# Patient Record
Sex: Male | Born: 1955 | Race: Black or African American | Hispanic: No | Smoking: Never smoker
Health system: Southern US, Community
[De-identification: ages and names within clinical notes are randomized; demographics above are authoritative.]

## PROBLEM LIST (undated history)

## (undated) DIAGNOSIS — I10 Essential (primary) hypertension: Secondary | ICD-10-CM
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
## 2019-05-09 ENCOUNTER — Inpatient Hospital Stay (HOSPITAL_COMMUNITY)
Admission: EM | Admit: 2019-05-09 | Discharge: 2019-05-13 | DRG: 445 | Disposition: A | Discharge status: Home / Self Care | Payer: No Typology Code available for payment source | Source: Ambulatory Visit | Attending: Internal Medicine | Admitting: Internal Medicine

## 2019-05-09 ENCOUNTER — Emergency Department (HOSPITAL_COMMUNITY): Payer: No Typology Code available for payment source

## 2019-05-09 ENCOUNTER — Other Ambulatory Visit: Payer: Self-pay

## 2019-05-09 ENCOUNTER — Encounter (HOSPITAL_COMMUNITY): Payer: Self-pay | Admitting: Internal Medicine

## 2019-05-09 DIAGNOSIS — R9431 Abnormal electrocardiogram [ECG] [EKG]: Secondary | ICD-10-CM

## 2019-05-09 DIAGNOSIS — E871 Hypo-osmolality and hyponatremia: Secondary | ICD-10-CM | POA: Diagnosis present

## 2019-05-09 DIAGNOSIS — D649 Anemia, unspecified: Secondary | ICD-10-CM | POA: Diagnosis present

## 2019-05-09 DIAGNOSIS — T447X5A Adverse effect of beta-adrenoreceptor antagonists, initial encounter: Secondary | ICD-10-CM | POA: Diagnosis not present

## 2019-05-09 DIAGNOSIS — K760 Fatty (change of) liver, not elsewhere classified: Secondary | ICD-10-CM | POA: Diagnosis present

## 2019-05-09 DIAGNOSIS — Z807 Family history of other malignant neoplasms of lymphoid, hematopoietic and related tissues: Secondary | ICD-10-CM

## 2019-05-09 DIAGNOSIS — C801 Malignant (primary) neoplasm, unspecified: Secondary | ICD-10-CM | POA: Diagnosis not present

## 2019-05-09 DIAGNOSIS — R001 Bradycardia, unspecified: Secondary | ICD-10-CM | POA: Diagnosis not present

## 2019-05-09 DIAGNOSIS — D689 Coagulation defect, unspecified: Secondary | ICD-10-CM | POA: Diagnosis present

## 2019-05-09 DIAGNOSIS — R17 Unspecified jaundice: Secondary | ICD-10-CM | POA: Diagnosis not present

## 2019-05-09 DIAGNOSIS — N179 Acute kidney failure, unspecified: Secondary | ICD-10-CM | POA: Diagnosis present

## 2019-05-09 DIAGNOSIS — M1990 Unspecified osteoarthritis, unspecified site: Secondary | ICD-10-CM | POA: Diagnosis present

## 2019-05-09 DIAGNOSIS — Z20822 Contact with and (suspected) exposure to covid-19: Secondary | ICD-10-CM | POA: Diagnosis present

## 2019-05-09 DIAGNOSIS — K831 Obstruction of bile duct: Secondary | ICD-10-CM | POA: Diagnosis present

## 2019-05-09 DIAGNOSIS — E876 Hypokalemia: Secondary | ICD-10-CM | POA: Diagnosis present

## 2019-05-09 DIAGNOSIS — L299 Pruritus, unspecified: Secondary | ICD-10-CM | POA: Diagnosis present

## 2019-05-09 DIAGNOSIS — I10 Essential (primary) hypertension: Secondary | ICD-10-CM | POA: Diagnosis present

## 2019-05-09 DIAGNOSIS — G8929 Other chronic pain: Secondary | ICD-10-CM | POA: Diagnosis present

## 2019-05-09 DIAGNOSIS — C250 Malignant neoplasm of head of pancreas: Secondary | ICD-10-CM | POA: Diagnosis present

## 2019-05-09 HISTORY — DX: Essential (primary) hypertension: I10

## 2019-05-09 LAB — CBC
HCT: 33.4 % — ABNORMAL LOW (ref 39.0–52.0)
Hemoglobin: 11.5 g/dL — ABNORMAL LOW (ref 13.0–17.0)
MCH: 29.9 pg (ref 26.0–34.0)
MCHC: 34.4 g/dL (ref 30.0–36.0)
MCV: 87 fL (ref 80.0–100.0)
Platelets: 442 10*3/uL — ABNORMAL HIGH (ref 150–400)
RBC: 3.84 MIL/uL — ABNORMAL LOW (ref 4.22–5.81)
RDW: 17.5 % — ABNORMAL HIGH (ref 11.5–15.5)
WBC: 10.9 10*3/uL — ABNORMAL HIGH (ref 4.0–10.5)
nRBC: 0 % (ref 0.0–0.2)

## 2019-05-09 LAB — URINALYSIS, ROUTINE W REFLEX MICROSCOPIC
Glucose, UA: NEGATIVE mg/dL
Ketones, ur: NEGATIVE mg/dL
Leukocytes,Ua: NEGATIVE
Nitrite: NEGATIVE
Protein, ur: 100 mg/dL — AB
Specific Gravity, Urine: 1.015 (ref 1.005–1.030)
pH: 6 (ref 5.0–8.0)

## 2019-05-09 LAB — HEPATIC FUNCTION PANEL
ALT: 176 U/L — ABNORMAL HIGH (ref 0–44)
AST: 183 U/L — ABNORMAL HIGH (ref 15–41)
Albumin: 2.7 g/dL — ABNORMAL LOW (ref 3.5–5.0)
Alkaline Phosphatase: 668 U/L — ABNORMAL HIGH (ref 38–126)
Bilirubin, Direct: 31.1 mg/dL — ABNORMAL HIGH (ref 0.0–0.2)
Indirect Bilirubin: 17.3 mg/dL — ABNORMAL HIGH (ref 0.3–0.9)
Total Bilirubin: 48.4 mg/dL (ref 0.3–1.2)
Total Protein: 7.8 g/dL (ref 6.5–8.1)

## 2019-05-09 LAB — BASIC METABOLIC PANEL
Anion gap: 17 — ABNORMAL HIGH (ref 5–15)
BUN: 21 mg/dL (ref 8–23)
CO2: 22 mmol/L (ref 22–32)
Calcium: 10 mg/dL (ref 8.9–10.3)
Chloride: 88 mmol/L — ABNORMAL LOW (ref 98–111)
Creatinine, Ser: 1.41 mg/dL — ABNORMAL HIGH (ref 0.61–1.24)
GFR calc Af Amer: >60 mL/min (ref >60)
GFR calc non Af Amer: 52 mL/min — ABNORMAL LOW (ref >60)
Glucose, Bld: 117 mg/dL — ABNORMAL HIGH (ref 70–99)
Potassium: 2.7 mmol/L — CL (ref 3.5–5.1)
Sodium: 127 mmol/L — ABNORMAL LOW (ref 135–145)

## 2019-05-09 LAB — LIPASE, BLOOD: Lipase: 22 U/L (ref 11–51)

## 2019-05-09 MED ORDER — POTASSIUM CHLORIDE 10 MEQ/100ML IV SOLN
10.0000 meq | Freq: Once | INTRAVENOUS | Status: AC
  Administered 2019-05-09: 20:00:00 10 meq via INTRAVENOUS
  Filled 2019-05-09: qty 100

## 2019-05-09 MED ORDER — POTASSIUM CHLORIDE CRYS ER 20 MEQ PO TBCR
40.0000 meq | EXTENDED_RELEASE_TABLET | Freq: Once | ORAL | Status: AC
  Administered 2019-05-09: 20:00:00 40 meq via ORAL
  Filled 2019-05-09: qty 2

## 2019-05-09 MED ORDER — SODIUM CHLORIDE 0.9 % IV BOLUS
1000.0000 mL | Freq: Once | INTRAVENOUS | Status: AC
  Administered 2019-05-09: 20:00:00 1000 mL via INTRAVENOUS

## 2019-05-09 NOTE — ED Provider Notes (Signed)
[facility] EMERGENCY DEPARTMENT Provider Note   CSN: [id number] Arrival date & time: 05/09/19  1429     History Chief Complaint  Patient presents with  . Abnormal Lab    Isaac Doyle is a 64 y.o. male with past medical history significant for hypertension and osteoarthritis who presents to the ED after being informed by the [facility] that he had elevated liver enzymes.  Patient reports that a couple of weeks ago, he became pruritic and suspected seasonal allergies.  However, after it continued to persist and he developed dark urine, he called his family practitioner.  They obtained basic labs which evidently revealed transaminitis and elevated bilirubin, prompting them to send him to the ER for evaluation.  He endorses diminished appetite and intermittent nausea in addition to his generalized pruritus and dark urine, but denies any recent illness, fevers or chills, abdominal pain, vomiting or loose stools, dysuria, chest pain or shortness of breath, or other changes in bowel habits.  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  He will continue to have beverages intermittently, but not as frequently.  He denies any history of IVDA or other illicit drug use.  HPI     No past medical history on file.  There are no problems to display for this patient.    No family history on file.  Social History   Tobacco Use  . Smoking status: Not on file  Substance Use Topics  . Alcohol use: Not on file  . Drug use: Not on file    Home Medications Prior to Admission medications   Not on File    Allergies    Patient has no allergy information on record.  Review of Systems   Review of Systems  Constitutional: Positive for appetite change.  Respiratory: Negative for shortness of breath.   Cardiovascular: Negative for chest pain.  Gastrointestinal: Negative for abdominal pain, diarrhea and vomiting.  Skin: Negative for rash.     Physical  Exam Updated Vital Signs BP 127/60   Pulse (!) 52   Temp 98.5 F (36.9 C) (Oral)   Resp 14   Ht 5\' 11"  (1.803 m)   Wt 88.5 kg   SpO2 99%   BMI 27.20 kg/m   Physical Exam Vitals and nursing note reviewed. Exam conducted with a chaperone present.  Constitutional:      Appearance: Normal appearance.  HENT:     Head: Normocephalic and atraumatic.  Eyes:     General: Scleral icterus present.  Cardiovascular:     Rate and Rhythm: Normal rate and regular rhythm.     Pulses: Normal pulses.     Heart sounds: Normal heart sounds.  Pulmonary:     Effort: Pulmonary effort is normal. No respiratory distress.     Breath sounds: Normal breath sounds.  Abdominal:     Comments: Soft, nondistended.  No RUQ TTP or tenderness elsewhere.  No guarding.  NABS. Wearing a weight belt.   Musculoskeletal:     Cervical back: Normal range of motion. No rigidity.  Skin:    General: Skin is dry.     Capillary Refill: Capillary refill takes less than 2 seconds.     Coloration: Skin is jaundiced.  Neurological:     Mental Status: He is alert and oriented to person, place, and time.     GCS: GCS eye subscore is 4. GCS verbal subscore is 5. GCS motor subscore is 6.  Psychiatric:  Mood and Affect: Mood normal.        Behavior: Behavior normal.        Thought Content: Thought content normal.     ED Results / Procedures / Treatments   Labs (all labs ordered are listed, but only abnormal results are displayed) Labs Reviewed  CBC - Abnormal; Notable for the following components:      Result Value   WBC 10.9 (*)    RBC 3.84 (*)    Hemoglobin 11.5 (*)    HCT 33.4 (*)    RDW 17.5 (*)    Platelets 442 (*)    All other components within normal limits  BASIC METABOLIC PANEL - Abnormal; Notable for the following components:   Sodium 127 (*)    Potassium 2.7 (*)    Chloride 88 (*)    Glucose, Bld 117 (*)    Creatinine, Ser 1.41 (*)    GFR calc non Af Amer 52 (*)    Anion gap 17 (*)    All  other components within normal limits  URINALYSIS, ROUTINE W REFLEX MICROSCOPIC - Abnormal; Notable for the following components:   Color, Urine AMBER (*)    Hgb urine dipstick SMALL (*)    Bilirubin Urine MODERATE (*)    Protein, ur 100 (*)    Bacteria, UA RARE (*)    All other components within normal limits  HEPATIC FUNCTION PANEL - Abnormal; Notable for the following components:   Albumin 2.7 (*)    AST 183 (*)    ALT 176 (*)    Alkaline Phosphatase 668 (*)    Total Bilirubin 48.4 (*)    Bilirubin, Direct 31.1 (*)    Indirect Bilirubin 17.3 (*)    All other components within normal limits  SARS CORONAVIRUS 2 (TAT 6-24 HRS)  LIPASE, BLOOD    EKG None  Radiology US Abdomen Limited  Result Date: 05/09/2019 CLINICAL DATA:  64 year old male with jaundice and elevated LFTs. EXAM: ULTRASOUND ABDOMEN LIMITED RIGHT UPPER QUADRANT COMPARISON:  None. FINDINGS: Gallbladder: The gallbladder is distended. There is sludge and stones within the gallbladder. No gallbladder wall thickening or pericholecystic fluid. Common bile duct: Diameter: 15 mm. There is dilatation of the common bile duct as well as moderate intrahepatic biliary ductal dilatation. A centrally obstructing stone or mass is not excluded. Further initial evaluation with CT of the abdomen pelvis with IV contrast is recommended. MRCP may provide additional evaluation based on CT findings. Liver: There is mild diffuse increased liver echogenicity most commonly seen in the setting of fatty infiltration. Superimposed inflammation or fibrosis is not excluded. Clinical correlation is recommended. Portal vein is patent on color Doppler imaging with normal direction of blood flow towards the liver. Other: There is a 2.0 x 1.9 x 1.8 cm hypoechoic lesion in the region of the porta pedis and the head of the pancreas. IMPRESSION: Cholelithiasis with findings concerning for central biliary obstruction. Further evaluation with CT with IV contrast is  recommended. No definite sonographic findings of acute cholecystitis. Electronically Signed   By: Anner Crete M.D.   On: 05/09/2019 21:15    Procedures Procedures (including critical care time)  Medications Ordered in ED Medications  potassium chloride 10 mEq in 100 mL IVPB ( Intravenous Stopped 05/09/19 2128)  potassium chloride SA (KLOR-CON) CR tablet 40 mEq (40 mEq Oral Given 05/09/19 2017)  sodium chloride 0.9 % bolus 1,000 mL (1,000 mLs Intravenous New Bag/Given 05/09/19 2018)    ED Course  I have  reviewed the triage vital signs and the nursing notes.  Pertinent labs & imaging results that were available during my care of the patient were reviewed by me and considered in my medical decision making (see chart for details).  Clinical Course as of May 08 2316  Mon May 09, 2019  2143 Spoke with [facility] gastroenterology.  She will put in a consult note and we will ultimately have patient admitted for MRCP.  She prefers MRCP over CT with contrast.   [GG]  2311 Spoke with hospitalist who will admit patient.    [GG]    Clinical Course User Index [GG] Corena Herter, PA-C   MDM Rules/Calculators/A&P                      Hepatic function panel test demonstrates total bilirubin elevated at 48.4 in addition to transaminitis and elevated alkaline phosphatase of 668.  Direct bilirubin is 31.1 and indirect is 17.3.  Patient was also found to be hypokalemic to 2.7 and hyponatremic to 127.  Replenished with 1 L NS, 40 mEq potassium p.o., and 10 mEq IV.    Limited ultrasound obtained of RUQ demonstrates dilation of the CBD and cholelithiasis with findings concerning for central biliary obstruction.  The gallbladder is distended, but there is no gallbladder wall thickening and negative sonographic Murphy's sign.  Before obtaining CT with contrast, we will first consult with gastroenterology to determine whether or not they would prefer MRCP or ERCP tomorrow.  Will keep NPO after midnight.   Spoke with [facility] gastroenterology.  She will put in a consult note and we will ultimately have patient admitted for MRCP.  She prefers MRCP over CT with contrast.  Spoke with hospitalist who will admit patient.   Final Clinical Impression(s) / ED Diagnoses Final diagnoses:  Hypokalemia  Prolonged Q-T interval on ECG  Jaundice    Rx / DC Orders ED Discharge Orders    None       Corena Herter, PA-C 05/10/19 0008    Margette Fast, MD 05/10/19 1246

## 2019-05-09 NOTE — ED Triage Notes (Signed)
Pt reports dark urine and loss of appetite for 2 weeks. Had labs done at [facility] and sent here due to elevated liver enzymes. Pt jaundice.

## 2019-05-10 ENCOUNTER — Inpatient Hospital Stay (HOSPITAL_COMMUNITY): Payer: No Typology Code available for payment source

## 2019-05-10 ENCOUNTER — Encounter (HOSPITAL_COMMUNITY): Payer: Self-pay | Admitting: Internal Medicine

## 2019-05-10 DIAGNOSIS — I10 Essential (primary) hypertension: Secondary | ICD-10-CM | POA: Diagnosis present

## 2019-05-10 DIAGNOSIS — E876 Hypokalemia: Secondary | ICD-10-CM | POA: Diagnosis present

## 2019-05-10 DIAGNOSIS — D649 Anemia, unspecified: Secondary | ICD-10-CM | POA: Diagnosis present

## 2019-05-10 DIAGNOSIS — N179 Acute kidney failure, unspecified: Secondary | ICD-10-CM | POA: Diagnosis present

## 2019-05-10 DIAGNOSIS — R17 Unspecified jaundice: Secondary | ICD-10-CM

## 2019-05-10 DIAGNOSIS — C801 Malignant (primary) neoplasm, unspecified: Secondary | ICD-10-CM

## 2019-05-10 DIAGNOSIS — K831 Obstruction of bile duct: Secondary | ICD-10-CM

## 2019-05-10 LAB — BASIC METABOLIC PANEL
Anion gap: 14 (ref 5–15)
Anion gap: 11 (ref 5–15)
BUN: 15 mg/dL (ref 8–23)
BUN: 11 mg/dL (ref 8–23)
CO2: 24 mmol/L (ref 22–32)
CO2: 20 mmol/L — ABNORMAL LOW (ref 22–32)
Calcium: 9.5 mg/dL (ref 8.9–10.3)
Calcium: 8.5 mg/dL — ABNORMAL LOW (ref 8.9–10.3)
Chloride: 92 mmol/L — ABNORMAL LOW (ref 98–111)
Chloride: 93 mmol/L — ABNORMAL LOW (ref 98–111)
Creatinine, Ser: 1.27 mg/dL — ABNORMAL HIGH (ref 0.61–1.24)
Creatinine, Ser: 1.04 mg/dL (ref 0.61–1.24)
GFR calc Af Amer: >60 mL/min (ref >60)
GFR calc Af Amer: >60 mL/min (ref >60)
GFR calc non Af Amer: 59 mL/min — ABNORMAL LOW (ref >60)
GFR calc non Af Amer: >60 mL/min (ref >60)
Glucose, Bld: 119 mg/dL — ABNORMAL HIGH (ref 70–99)
Glucose, Bld: 248 mg/dL — ABNORMAL HIGH (ref 70–99)
Potassium: 2.2 mmol/L — CL (ref 3.5–5.1)
Potassium: 3.8 mmol/L (ref 3.5–5.1)
Sodium: 130 mmol/L — ABNORMAL LOW (ref 135–145)
Sodium: 124 mmol/L — ABNORMAL LOW (ref 135–145)

## 2019-05-10 LAB — HEPATITIS PANEL, ACUTE
HCV Ab: NONREACTIVE
Hep A IgM: NONREACTIVE
Hep B C IgM: NONREACTIVE
Hepatitis B Surface Ag: NONREACTIVE

## 2019-05-10 LAB — CBC
HCT: 30.7 % — ABNORMAL LOW (ref 39.0–52.0)
Hemoglobin: 10.6 g/dL — ABNORMAL LOW (ref 13.0–17.0)
MCH: 30.1 pg (ref 26.0–34.0)
MCHC: 34.5 g/dL (ref 30.0–36.0)
MCV: 87.2 fL (ref 80.0–100.0)
Platelets: 392 10*3/uL (ref 150–400)
RBC: 3.52 MIL/uL — ABNORMAL LOW (ref 4.22–5.81)
RDW: 17.6 % — ABNORMAL HIGH (ref 11.5–15.5)
WBC: 8.6 10*3/uL (ref 4.0–10.5)
nRBC: 0 % (ref 0.0–0.2)

## 2019-05-10 LAB — PROTIME-INR
INR: 2.4 — ABNORMAL HIGH (ref 0.8–1.2)
Prothrombin Time: 25.9 seconds — ABNORMAL HIGH (ref 11.4–15.2)

## 2019-05-10 LAB — ACETAMINOPHEN LEVEL: Acetaminophen (Tylenol), Serum: <10 ug/mL — ABNORMAL LOW (ref 10–30)

## 2019-05-10 LAB — MAGNESIUM: Magnesium: 1.8 mg/dL (ref 1.7–2.4)

## 2019-05-10 LAB — SARS CORONAVIRUS 2 (TAT 6-24 HRS): SARS Coronavirus 2: NEGATIVE

## 2019-05-10 LAB — HIV ANTIBODY (ROUTINE TESTING W REFLEX): HIV Screen 4th Generation wRfx: NONREACTIVE

## 2019-05-10 LAB — ETHANOL: Alcohol, Ethyl (B): <10 mg/dL (ref <10)

## 2019-05-10 MED ORDER — SODIUM CHLORIDE 0.9 % IV SOLN: INTRAVENOUS | Status: DC

## 2019-05-10 MED ORDER — VITAMIN K1 10 MG/ML IJ SOLN
10.0000 mg | Freq: Once | INTRAVENOUS | Status: AC
  Administered 2019-05-10: 10 mg via INTRAVENOUS
  Filled 2019-05-10: qty 1

## 2019-05-10 MED ORDER — POTASSIUM CHLORIDE CRYS ER 20 MEQ PO TBCR
40.0000 meq | EXTENDED_RELEASE_TABLET | Freq: Two times a day (BID) | ORAL | Status: AC
  Administered 2019-05-10: 09:00:00 40 meq via ORAL
  Filled 2019-05-10: qty 2

## 2019-05-10 MED ORDER — AMLODIPINE BESYLATE 10 MG PO TABS
10.0000 mg | ORAL_TABLET | Freq: Every day | ORAL | Status: DC
  Administered 2019-05-10 – 2019-05-12 (×2): 10 mg via ORAL
  Filled 2019-05-10 (×2): qty 1
  Filled 2019-05-10: qty 2

## 2019-05-10 MED ORDER — THIAMINE HCL 100 MG/ML IJ SOLN
100.0000 mg | Freq: Every day | INTRAMUSCULAR | Status: DC
  Administered 2019-05-10 – 2019-05-11 (×2): 100 mg via INTRAVENOUS
  Filled 2019-05-10 (×2): qty 2

## 2019-05-10 MED ORDER — ATENOLOL 50 MG PO TABS
200.0000 mg | ORAL_TABLET | Freq: Every day | ORAL | Status: DC
  Administered 2019-05-10: 09:00:00 200 mg via ORAL
  Filled 2019-05-10: qty 4

## 2019-05-10 MED ORDER — WHITE PETROLATUM EX OINT
TOPICAL_OINTMENT | CUTANEOUS | Status: AC
  Administered 2019-05-10: 0.2
  Filled 2019-05-10: qty 28.35

## 2019-05-10 MED ORDER — POTASSIUM CHLORIDE 10 MEQ/100ML IV SOLN
10.0000 meq | INTRAVENOUS | Status: AC
  Administered 2019-05-10 (×2): 10 meq via INTRAVENOUS
  Filled 2019-05-10 (×2): qty 100

## 2019-05-10 MED ORDER — POTASSIUM CHLORIDE CRYS ER 20 MEQ PO TBCR: 40.0000 meq | EXTENDED_RELEASE_TABLET | Freq: Two times a day (BID) | ORAL | Status: DC

## 2019-05-10 MED ORDER — POTASSIUM CHLORIDE 10 MEQ/100ML IV SOLN
10.0000 meq | INTRAVENOUS | Status: AC
  Administered 2019-05-10 (×4): 10 meq via INTRAVENOUS
  Filled 2019-05-10 (×4): qty 100

## 2019-05-10 MED ORDER — DEXTROSE-NACL 5-0.45 % IV SOLN: INTRAVENOUS | Status: DC

## 2019-05-10 NOTE — Consult Note (Addendum)
Consultation  Referring Provider: [facility]/ Lara Mulch  Primary Care Physician:  System, Pcp Not In Primary Gastroenterologist:  unassigned.  Reason for Consultation:  Jaundice  HPI: Isaac Doyle is a 64 y.o. male, who was admitted through the emergency room last evening, after he had gone to the [place] with concerns for jaundice.  He had labs done and was told to go to an emergency room. He relates 2- 3-week history of dark urine, onset of jaundice, and poor appetite.Marland Kitchen  He says he became concerned as his urine became progressively more dark.  At onset of symptoms he also noted pruritus which has persisted.  He denies any abdominal pain, no nausea or vomiting, and no fever or chills.  He thinks he has lost about 15 pounds since onset of symptoms.  Labs in the ER here with T bili of 48.4/AST 183/ALT 176/alk phos 668, albumin 2.7 WBC 10.9/hemoglobin 11.5 Potassium 2.7 and corrected. COVID-19 negative. Upper abdominal ultrasound showed a distended gallbladder with multiple stones and sludge, no gallbladder wall thickening, CBD of 15 mm with associated intrahepatic ductal dilation.  Could not rule out centrally obstructing stone versus mass.  Also noted to have a fatty liver and a hypoechoic region at the porta hepatis measuring 2.0 x 1.9 x 1.8 cm.  Patient had MRI/MRCP this morning without contrast which shows marked intrahepatic ductal dilation and CBD dilation to 16 mm.  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  No obvious adenopathy or metastatic disease.  He receives his general medical care from the [facility], says he has hypertension and chronic back pain.  He has had prior colonoscopies and thinks he is due for a follow-up.   Past Medical History:  Diagnosis Date  . Hypertension     History reviewed. No pertinent surgical history.  Prior to Admission medications   Medication  Sig Start Date End Date Taking? Authorizing Provider  amLODipine (NORVASC) 10 MG tablet Take 10 mg by mouth daily.   Yes [provider]  atenolol-chlorthalidone (TENORETIC) 100-25 MG tablet Take 2 tablets by mouth daily.   Yes [provider]  cetirizine (ZYRTEC) 10 MG tablet Take 10 mg by mouth daily.   Yes [provider]  guaifenesin (HUMIBID E) 400 MG TABS tablet Take 400 mg by mouth 2 (two) times daily.   Yes [provider]  sulindac (CLINORIL) 200 MG tablet Take 200 mg by mouth 2 (two) times daily.   Yes [provider]    Current Facility-Administered Medications  Medication Dose Route Frequency Provider Last Rate Last Admin  . amLODipine (NORVASC) tablet 10 mg  10 mg Oral Daily Rise Patience, MD   10 mg at 05/10/19 9371  . atenolol (TENORMIN) tablet 200 mg  200 mg Oral Daily Rise Patience, MD   200 mg at 05/10/19 0851  . potassium chloride 10 mEq in 100 mL IVPB  10 mEq Intravenous Q1 Hr x 6 Alma Friendly, MD 100 mL/hr at 05/10/19 1025 10 mEq at 05/10/19 1025  . potassium chloride SA (KLOR-CON) CR tablet 40 mEq  40 mEq Oral BID Alma Friendly, MD      . thiamine (B-1) injection 100 mg  100 mg Intravenous Daily Rise Patience, MD   100 mg at 05/10/19 6967   Current Outpatient Medications  Medication Sig Dispense Refill  . amLODipine (NORVASC) 10  MG tablet Take 10 mg by mouth daily.    Marland Kitchen atenolol-chlorthalidone (TENORETIC) 100-25 MG tablet Take 2 tablets by mouth daily.    . cetirizine (ZYRTEC) 10 MG tablet Take 10 mg by mouth daily.    Marland Kitchen guaifenesin (HUMIBID E) 400 MG TABS tablet Take 400 mg by mouth 2 (two) times daily.    . sulindac (CLINORIL) 200 MG tablet Take 200 mg by mouth 2 (two) times daily.      Allergies as of 05/09/2019  . (Not on File)    Family History  Problem Relation Age of Onset  . Lymphoma Mother     Social History   Socioeconomic History  . Marital status: Divorced    Spouse  name: Not on file  . Number of children: Not on file  . Years of education: Not on file  . Highest education level: Not on file  Occupational History  . Not on file  Tobacco Use  . Smoking status: Never Smoker  . Smokeless tobacco: Never Used  Substance and Sexual Activity  . Alcohol use: Yes  . Drug use: Not Currently  . Sexual activity: Not on file  Other Topics Concern  . Not on file  Social History Narrative  . Not on file   Social Determinants of Health   Financial Resource Strain:   . Difficulty of Paying Living Expenses:   Food Insecurity:   . Worried About Charity fundraiser in the Last Year:   . Arboriculturist in the Last Year:   Transportation Needs:   . Film/video editor (Medical):   Marland Kitchen Lack of Transportation (Non-Medical):   Physical Activity:   . Days of Exercise per Week:   . Minutes of Exercise per Session:   Stress:   . Feeling of Stress :   Social Connections:   . Frequency of Communication with Friends and Family:   . Frequency of Social Gatherings with Friends and Family:   . Attends Religious Services:   . Active Member of Clubs or Organizations:   . Attends Archivist Meetings:   Marland Kitchen Marital Status:   Intimate Partner Violence:   . Fear of Current or Ex-Partner:   . Emotionally Abused:   Marland Kitchen Physically Abused:   . Sexually Abused:     Review of Systems: Pertinent positive and negative review of systems were noted in the above HPI section.  All other review of systems was otherwise negative.Marland Kitchen  Physical Exam: Vital signs in last 24 hours: Temp:  [98.3 F (36.8 C)-98.5 F (36.9 C)] 98.3 F (36.8 C) (04/06 0400) Pulse Rate:  [50-76] 60 (04/06 0930) Resp:  [14-20] 18 (04/06 0930) BP: (105-140)/(60-96) 124/76 (04/06 0930) SpO2:  [97 %-100 %] 99 % (04/06 0930) Weight:  [88.5 kg] 88.5 kg (04/05 1442)   General:   Alert,  Well-developed, well-nourished, older African-American male pleasant and cooperative in NAD.  Daughter at  bedside Head:  Normocephalic and atraumatic. Eyes:  Sclera deeply icteric.   Conjunctiva pink. Ears:  Normal auditory acuity. Nose:  No deformity, discharge,  or lesions. Mouth:  No deformity or lesions.   Neck:  Supple; no masses or thyromegaly. Lungs:  Clear throughout to auscultation.   No wheezes, crackles, or rhonchi. Heart:  Regular rate and rhythm; no murmurs, clicks, rubs,  or gallops. Abdomen:  Soft,nontender, BS active,nonpalp mass or hsm.   Rectal:  Deferred  Msk:  Symmetrical without gross deformities. . Pulses:  Normal pulses noted. Extremities:  Without clubbing or edema. Neurologic:  Alert and  oriented x4;  grossly normal neurologically. Skin: Jaundiced Psych:  Alert and cooperative. Normal mood and affect.  Intake/Output from previous day: 04/05 0701 - 04/06 0700 In: 631.7 [IV Piggyback:631.7] Out: 1000 [Urine:1000] Intake/Output this shift: Total I/O In: 100 [IV Piggyback:100] Out: -   Lab Results: Recent Labs    05/09/19 1451 05/10/19 0621  WBC 10.9* 8.6  HGB 11.5* 10.6*  HCT 33.4* 30.7*  PLT 442* 392   BMET Recent Labs    05/09/19 1451 05/10/19 0621  NA 127* 130*  K 2.7* 2.2*  CL 88* 92*  CO2 22 24  GLUCOSE 117* 119*  BUN 21 15  CREATININE 1.41* 1.27*  CALCIUM 10.0 9.5   LFT Recent Labs    05/09/19 1924  PROT 7.8  ALBUMIN 2.7*  AST 183*  ALT 176*  ALKPHOS 668*  BILITOT 48.4*  BILIDIR 31.1*  IBILI 17.3*   PT/INR No results for input(s): LABPROT, INR in the last 72 hours. Hepatitis Panel No results for input(s): HEPBSAG, HCVAB, HEPAIGM, HEPBIGM in the last 72 hours.      IMPRESSION:  #74 64 year old African-American male with 2 to 3-week history, onset of painless jaundice associated with pruritus, lack of appetite and weight loss. Noted to have marked elevation of T bili at 48.4. 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  He also has multiple gallstones, no gallbladder  wall thickening and no evidence of choledocholithiasis on MRCP.  #2 hypertension #3 history of chronic back pain #4 remote history of colon polyps #5 Daily EtOH use-generally 6 beers per day  #6 hypokalemia- last k+ down to 2.2 - I ordered 3 runs- may need additional   Plan; Patient will need ERCP, with probable stent placement and brushings of the bile duct.  Procedure was discussed in detail with the patient and his daughter including indications risks and benefits to include post procedure pancreatitis, bleeding, infection, perforation and procedure failure and he has agreed to proceed. Patient will be scheduled with Dr. Scarlette Shorts for tomorrow afternoon.  Preop antibiotics ordered Full liquid diet today, n.p.o. past midnight tonight Potassium will need to be corrected to at least 3.0 prior to anesthesia  Have ordered CA 19-9 and CEA. Thank you will follow with you.    Author Hatlestad EsterwoodPA-C  05/10/2019, 11:15 AM

## 2019-05-10 NOTE — ED Notes (Signed)
Lunch Tray Ordered @ 1052.  

## 2019-05-10 NOTE — H&P (Signed)
History and Physical    Nikoloz Kurowski [id number] DOB: [birth year] DOA: 05/09/2019  PCP: System, Pcp Not In  Patient coming from: Home.  Chief Complaint: Jaundice.  HPI: Daveed Zabaleta is a 64 y.o. male with history of hypertension presents to the ER the patient was noticed to have elevated liver enzymes.  Patient states over the last 2 weeks patient has been getting more jaundiced with dark urine and poor appetite.  Had gone to [place] had labs done and was found to have abnormal LFTs and was referred to the ER.  Denies any abdominal pain nausea vomiting diarrhea fever or chills.  Denies chest pain or shortness of breath.  ED Course: In the ER on exam patient is icteric abdomen is benign on exam.  Labs show total bilirubin of 48.4 with AST of 183 ALT of 176 alkaline phosphatase 668 albumin 2.7 hemoglobin 11.5 platelets 442 WBC 10.9 creatinine 1.4 Covid test negative.  Potassium was 2.7 for which patient was given potassium replacement.  Some of the abdomen shows gallstones with possible CBD obstruction for which liver GI was consulted who requested MRCP.  Admitted for further management.  Review of Systems: As per HPI, rest all negative.   Past Medical History:  Diagnosis Date  . Hypertension     History reviewed. No pertinent surgical history.   reports that he has never smoked. He has never used smokeless tobacco. He reports current alcohol use. He reports previous drug use.  Not on File  Family History  Problem Relation Age of Onset  . Lymphoma Mother     Prior to Admission medications   Not on File    Physical Exam: Constitutional: Moderately built and nourished. Vitals:   05/09/19 2130 05/09/19 2300 05/09/19 2330 05/10/19 0100  BP: 127/60 (!) 124/96 131/65 130/60  Pulse: (!) 52 (!) 50 (!) 57   Resp: 14 19 15    Temp:      TempSrc:      SpO2: 99% 100% 100% 100%  Weight:      Height:       Eyes: Icterus present no pallor. ENMT: No discharge from the ears eyes  nose or mouth. Neck: No mass felt.  No neck rigidity. Respiratory: No rhonchi or crepitations. Cardiovascular: S1-S2 heard. Abdomen: Soft nontender bowel sound present. Musculoskeletal: No edema. Skin: No rash. Neurologic: Alert awake oriented to time place and person.  Moves all extremities. Psychiatric: Appears normal per normal affect.   Labs on Admission: I have personally reviewed following labs and imaging studies  CBC: Recent Labs  Lab 05/09/19 1451  WBC 10.9*  HGB 11.5*  HCT 33.4*  MCV 87.0  PLT 99991111*   Basic Metabolic Panel: Recent Labs  Lab 05/09/19 1451  NA 127*  K 2.7*  CL 88*  CO2 22  GLUCOSE 117*  BUN 21  CREATININE 1.41*  CALCIUM 10.0   GFR: Estimated Creatinine Clearance: 55.6 mL/min (A) (by C-G formula based on SCr of 1.41 mg/dL (H)). Liver Function Tests: Recent Labs  Lab 05/09/19 1924  AST 183*  ALT 176*  ALKPHOS 668*  BILITOT 48.4*  PROT 7.8  ALBUMIN 2.7*   Recent Labs  Lab 05/09/19 1943  LIPASE 22   No results for input(s): AMMONIA in the last 168 hours. Coagulation Profile: No results for input(s): INR, PROTIME in the last 168 hours. Cardiac Enzymes: No results for input(s): CKTOTAL, CKMB, CKMBINDEX, TROPONINI in the last 168 hours. BNP (last 3 results) No results for input(s): PROBNP  in the last 8760 hours. HbA1C: No results for input(s): HGBA1C in the last 72 hours. CBG: No results for input(s): GLUCAP in the last 168 hours. Lipid Profile: No results for input(s): CHOL, HDL, LDLCALC, TRIG, CHOLHDL, LDLDIRECT in the last 72 hours. Thyroid Function Tests: No results for input(s): TSH, T4TOTAL, FREET4, T3FREE, THYROIDAB in the last 72 hours. Anemia Panel: No results for input(s): VITAMINB12, FOLATE, FERRITIN, TIBC, IRON, RETICCTPCT in the last 72 hours. Urine analysis:    Component Value Date/Time   COLORURINE AMBER (A) 05/09/2019 1830   APPEARANCEUR CLEAR 05/09/2019 1830   LABSPEC 1.015 05/09/2019 1830   PHURINE 6.0  05/09/2019 1830   GLUCOSEU NEGATIVE 05/09/2019 1830   HGBUR SMALL (A) 05/09/2019 1830   BILIRUBINUR MODERATE (A) 05/09/2019 1830   [place] 05/09/2019 1830   PROTEINUR 100 (A) 05/09/2019 1830   NITRITE NEGATIVE 05/09/2019 1830   LEUKOCYTESUR NEGATIVE 05/09/2019 1830   Sepsis Labs: @LABRCNTIP (procalcitonin:4,lacticidven:4) )No results found for this or any previous visit (from the past 240 hour(s)).   Radiological Exams on Admission: US Abdomen Limited  Result Date: 05/09/2019 CLINICAL DATA:  64 year old male with jaundice and elevated LFTs. EXAM: ULTRASOUND ABDOMEN LIMITED RIGHT UPPER QUADRANT COMPARISON:  None. FINDINGS: Gallbladder: The gallbladder is distended. There is sludge and stones within the gallbladder. No gallbladder wall thickening or pericholecystic fluid. Common bile duct: Diameter: 15 mm. There is dilatation of the common bile duct as well as moderate intrahepatic biliary ductal dilatation. A centrally obstructing stone or mass is not excluded. Further initial evaluation with CT of the abdomen pelvis with IV contrast is recommended. MRCP may provide additional evaluation based on CT findings. Liver: There is mild diffuse increased liver echogenicity most commonly seen in the setting of fatty infiltration. Superimposed inflammation or fibrosis is not excluded. Clinical correlation is recommended. Portal vein is patent on color Doppler imaging with normal direction of blood flow towards the liver. Other: There is a 2.0 x 1.9 x 1.8 cm hypoechoic lesion in the region of the porta pedis and the head of the pancreas. IMPRESSION: Cholelithiasis with findings concerning for central biliary obstruction. Further evaluation with CT with IV contrast is recommended. No definite sonographic findings of acute cholecystitis. Electronically Signed   By: Anner Crete M.D.   On: 05/09/2019 21:15    EKG: Independently reviewed.  Normal sinus rhythm.  Assessment/Plan Principal  Problem:   Jaundice Active Problems:   ARF (acute renal failure) (HCC)   Essential hypertension   Hypokalemia   Normochromic normocytic anemia    1. Obstructive jaundice -liver GI has been consulted.  We will keep patient n.p.o. except medications and check MRCP.  Follow LFTs. 2. Elevated LFTs likely from obstruction.  Check acute hepatitis panel and also patient states he does not drink alcohol.  Denies taking any Tylenol.  Will await MRCP. 3. Hypertension on amlodipine and atenolol. 4. History of chronic pain usually takes sulindac. 5. Renal failure could be acute.  No old labs to compare.  6. Hypokalemia likely from poor oral intake replace recheck check magnesium level with next blood draw. 7. Normocytic normochromic anemia no old labs to compare follow CBC. 8. Patient states he does drink alcohol sometimes has sixpack over a week.  Will closely monitor for any withdrawal thiamine for now.  Given the markedly elevated LFTs with possible obstruction patient will need close monitoring and further work-up and will need inpatient status.   DVT prophylaxis: SCDs.  Will avoid anticoagulation in anticipation of procedure. Code Status:  Full code. Family Communication: Discussed with patient. Disposition Plan: Home. Consults called: [facility]. Admission status: Observation.   Rise Patience MD [facility] Pager [phone].  If 7PM-7AM, please contact night-coverage [email or web address] Password [email or web address]  05/10/2019, 1:51 AM

## 2019-05-10 NOTE — ED Notes (Signed)
Pt transported to MRI 

## 2019-05-10 NOTE — ED Notes (Signed)
ED Provider at bedside. 

## 2019-05-10 NOTE — ED Notes (Signed)
Dr. Horris Latino notified of latest potassium value

## 2019-05-10 NOTE — ED Notes (Signed)
Breakfast Ordered 

## 2019-05-10 NOTE — Plan of Care (Signed)
  Problem: Education: Goal: Knowledge of General Education information will improve Description: Including pain rating scale, medication(s)/side effects and non-pharmacologic comfort measures Outcome: Progressing   Problem: Clinical Measurements: Goal: Diagnostic test results will improve Outcome: Progressing   Problem: Coping: Goal: Level of anxiety will decrease Outcome: Progressing   

## 2019-05-10 NOTE — Progress Notes (Signed)
   Follow Up Note Pt admitted earlier this morning, please see H&P for details Briefly 64 year old male with past medical history of hypertension, possible alcohol abuse, presents to the ER complaining of jaundice, dark urine and poor appetite for the past 2 weeks.  [facility] and was found to have abnormal LFTs and referred to the ER.  Labs notable for elevated liver enzymes, with total bili of about 48, and hypokalemia.  Abdominal ultrasound showed cholelithiasis but no findings of acute cholecystitis.  MRCP showed possible mass in the pancreatic head.  GI consulted, plan for ERCP on 05/11/2019.     Exam: CV: S1, S2 present Lungs: CTA B Abd: Soft, nontender, nondistended, bowel sounds present Ext: No pedal edema bilaterally  Present on Admission: . Jaundice . ARF (acute renal failure) ([place]) . Essential hypertension . Hypokalemia . 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

## 2019-05-11 ENCOUNTER — Encounter (HOSPITAL_COMMUNITY)
Admission: EM | Disposition: A | Discharge status: Home / Self Care | Payer: Self-pay | Source: Ambulatory Visit | Attending: Internal Medicine

## 2019-05-11 ENCOUNTER — Encounter (HOSPITAL_COMMUNITY): Payer: Self-pay | Admitting: Internal Medicine

## 2019-05-11 ENCOUNTER — Inpatient Hospital Stay (HOSPITAL_COMMUNITY): Payer: No Typology Code available for payment source

## 2019-05-11 ENCOUNTER — Inpatient Hospital Stay (HOSPITAL_COMMUNITY): Payer: No Typology Code available for payment source | Admitting: Certified Registered"

## 2019-05-11 DIAGNOSIS — K831 Obstruction of bile duct: Secondary | ICD-10-CM

## 2019-05-11 DIAGNOSIS — R17 Unspecified jaundice: Secondary | ICD-10-CM

## 2019-05-11 HISTORY — PX: SPHINCTEROTOMY: SHX5544

## 2019-05-11 HISTORY — PX: ERCP: SHX5425

## 2019-05-11 HISTORY — PX: BILIARY STENT PLACEMENT: SHX5538

## 2019-05-11 LAB — CBC WITH DIFFERENTIAL/PLATELET
Abs Immature Granulocytes: 0.04 10*3/uL (ref 0.00–0.07)
Basophils Absolute: 0.1 10*3/uL (ref 0.0–0.1)
Basophils Relative: 1 %
Eosinophils Absolute: 0.3 10*3/uL (ref 0.0–0.5)
Eosinophils Relative: 3 %
HCT: 31.9 % — ABNORMAL LOW (ref 39.0–52.0)
Hemoglobin: 10.9 g/dL — ABNORMAL LOW (ref 13.0–17.0)
Immature Granulocytes: 1 %
Lymphocytes Relative: 17 %
Lymphs Abs: 1.4 10*3/uL (ref 0.7–4.0)
MCH: 30.3 pg (ref 26.0–34.0)
MCHC: 34.2 g/dL (ref 30.0–36.0)
MCV: 88.6 fL (ref 80.0–100.0)
Monocytes Absolute: 0.6 10*3/uL (ref 0.1–1.0)
Monocytes Relative: 7 %
Neutro Abs: 5.9 10*3/uL (ref 1.7–7.7)
Neutrophils Relative %: 71 %
Platelets: 378 10*3/uL (ref 150–400)
RBC: 3.6 MIL/uL — ABNORMAL LOW (ref 4.22–5.81)
RDW: 17.3 % — ABNORMAL HIGH (ref 11.5–15.5)
WBC: 8.2 10*3/uL (ref 4.0–10.5)
nRBC: 0 % (ref 0.0–0.2)

## 2019-05-11 LAB — COMPREHENSIVE METABOLIC PANEL
ALT: 156 U/L — ABNORMAL HIGH (ref 0–44)
AST: 151 U/L — ABNORMAL HIGH (ref 15–41)
Albumin: 2.2 g/dL — ABNORMAL LOW (ref 3.5–5.0)
Alkaline Phosphatase: 538 U/L — ABNORMAL HIGH (ref 38–126)
Anion gap: 15 (ref 5–15)
BUN: 11 mg/dL (ref 8–23)
CO2: 20 mmol/L — ABNORMAL LOW (ref 22–32)
Calcium: 9.4 mg/dL (ref 8.9–10.3)
Chloride: 95 mmol/L — ABNORMAL LOW (ref 98–111)
Creatinine, Ser: 1.15 mg/dL (ref 0.61–1.24)
GFR calc Af Amer: >60 mL/min (ref >60)
GFR calc non Af Amer: >60 mL/min (ref >60)
Glucose, Bld: 97 mg/dL (ref 70–99)
Potassium: 2.4 mmol/L — CL (ref 3.5–5.1)
Sodium: 130 mmol/L — ABNORMAL LOW (ref 135–145)
Total Bilirubin: 42.3 mg/dL (ref 0.3–1.2)
Total Protein: 6.3 g/dL — ABNORMAL LOW (ref 6.5–8.1)

## 2019-05-11 LAB — CANCER ANTIGEN 19-9: CA 19-9: 2411 U/mL — ABNORMAL HIGH (ref 0–35)

## 2019-05-11 LAB — PROTIME-INR
INR: 1.3 — ABNORMAL HIGH (ref 0.8–1.2)
Prothrombin Time: 16.4 seconds — ABNORMAL HIGH (ref 11.4–15.2)

## 2019-05-11 LAB — CEA: CEA: 31.1 ng/mL — ABNORMAL HIGH (ref 0.0–4.7)

## 2019-05-11 LAB — MAGNESIUM: Magnesium: 1.6 mg/dL — ABNORMAL LOW (ref 1.7–2.4)

## 2019-05-11 LAB — PHOSPHORUS: Phosphorus: 4.9 mg/dL — ABNORMAL HIGH (ref 2.5–4.6)

## 2019-05-11 LAB — POTASSIUM: Potassium: 2.9 mmol/L — ABNORMAL LOW (ref 3.5–5.1)

## 2019-05-11 SURGERY — ERCP, WITH INTERVENTION IF INDICATED
Anesthesia: General

## 2019-05-11 MED ORDER — GLUCAGON HCL RDNA (DIAGNOSTIC) 1 MG IJ SOLR
INTRAMUSCULAR | Status: AC
  Filled 2019-05-11: qty 1

## 2019-05-11 MED ORDER — ONDANSETRON HCL 4 MG/2ML IJ SOLN
INTRAMUSCULAR | Status: DC | PRN
  Administered 2019-05-11: 4 mg via INTRAVENOUS

## 2019-05-11 MED ORDER — ROCURONIUM BROMIDE 10 MG/ML (PF) SYRINGE
PREFILLED_SYRINGE | INTRAVENOUS | Status: DC | PRN
  Administered 2019-05-11: 100 mg via INTRAVENOUS

## 2019-05-11 MED ORDER — DEXAMETHASONE SODIUM PHOSPHATE 10 MG/ML IJ SOLN
INTRAMUSCULAR | Status: DC | PRN
  Administered 2019-05-11: 5 mg via INTRAVENOUS

## 2019-05-11 MED ORDER — PHENYLEPHRINE HCL-NACL 10-0.9 MG/250ML-% IV SOLN
INTRAVENOUS | Status: DC | PRN
  Administered 2019-05-11: 50 ug/min via INTRAVENOUS

## 2019-05-11 MED ORDER — MAGNESIUM SULFATE 2 GM/50ML IV SOLN
2.0000 g | Freq: Once | INTRAVENOUS | Status: AC
  Administered 2019-05-11: 2 g via INTRAVENOUS
  Filled 2019-05-11: qty 50

## 2019-05-11 MED ORDER — POTASSIUM CHLORIDE 10 MEQ/100ML IV SOLN
10.0000 meq | INTRAVENOUS | Status: AC
  Administered 2019-05-11 (×6): 10 meq via INTRAVENOUS
  Filled 2019-05-11 (×2): qty 100

## 2019-05-11 MED ORDER — SODIUM CHLORIDE 0.9 % IV SOLN
1.5000 g | INTRAVENOUS | Status: AC
  Administered 2019-05-11: 1.5 g via INTRAVENOUS
  Filled 2019-05-11: qty 1.5

## 2019-05-11 MED ORDER — LIDOCAINE 2% (20 MG/ML) 5 ML SYRINGE
INTRAMUSCULAR | Status: DC | PRN
  Administered 2019-05-11: 100 mg via INTRAVENOUS

## 2019-05-11 MED ORDER — INDOMETHACIN 50 MG RE SUPP: 100.0000 mg | Freq: Once | RECTAL | Status: DC

## 2019-05-11 MED ORDER — PROPOFOL 10 MG/ML IV BOLUS
INTRAVENOUS | Status: DC | PRN
  Administered 2019-05-11: 140 mg via INTRAVENOUS

## 2019-05-11 MED ORDER — PHENYLEPHRINE 40 MCG/ML (10ML) SYRINGE FOR IV PUSH (FOR BLOOD PRESSURE SUPPORT)
PREFILLED_SYRINGE | INTRAVENOUS | Status: DC | PRN
  Administered 2019-05-11 (×2): 120 ug via INTRAVENOUS

## 2019-05-11 MED ORDER — ATENOLOL 50 MG PO TABS
100.0000 mg | ORAL_TABLET | Freq: Every day | ORAL | Status: DC
  Filled 2019-05-11 (×2): qty 2

## 2019-05-11 MED ORDER — INDOMETHACIN 50 MG RE SUPP
RECTAL | Status: AC
  Filled 2019-05-11: qty 1

## 2019-05-11 MED ORDER — PROPOFOL 1000 MG/100ML IV EMUL
INTRAVENOUS | Status: AC
  Filled 2019-05-11: qty 100

## 2019-05-11 MED ORDER — LACTATED RINGERS IV SOLN: INTRAVENOUS | Status: DC

## 2019-05-11 MED ORDER — FENTANYL CITRATE (PF) 100 MCG/2ML IJ SOLN
INTRAMUSCULAR | Status: AC
  Filled 2019-05-11: qty 2

## 2019-05-11 MED ORDER — FENTANYL CITRATE (PF) 250 MCG/5ML IJ SOLN
INTRAMUSCULAR | Status: DC | PRN
  Administered 2019-05-11: 100 ug via INTRAVENOUS

## 2019-05-11 MED ORDER — SUGAMMADEX SODIUM 200 MG/2ML IV SOLN
INTRAVENOUS | Status: DC | PRN
  Administered 2019-05-11: 400 mg via INTRAVENOUS

## 2019-05-11 NOTE — Progress Notes (Signed)
Progress Note    Isaac Doyle  [id number] DOB: [birth year]  DOA: 05/09/2019 PCP: System, Pcp Not In    Brief Narrative:     Medical records reviewed and are as summarized below:  Isaac Doyle is an 64 y.o. male with past medical history of hypertension, possible alcohol abuse, presents to the ER complaining of jaundice, dark urine and poor appetite for the past 2 weeks.  [facility] and was found to have abnormal LFTs and referred to the ER.  Labs notable for elevated liver enzymes, with total bili of about 48, and hypokalemia.  Abdominal ultrasound showed cholelithiasis but no findings of acute cholecystitis.  MRCP showed possible mass in the pancreatic head.  GI consulted, plan for ERCP on 05/11/2019.  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 Difficult to identify for certain without contrast or assess the major vascular structures. Patient may require a pancreatic protocol CT scan. Recommend GI consultation for biliary drainage and probable EUS and biopsy. -N.p.o. -ERCP planned for 4/7  Elevated LFTs likely from obstruction -Daily labs  Severe hypokalemia -Replete IV along with magnesium  Hypertension -Decrease atenolol due to bradycardia  History of chronic pain  -takes sulindac.  Acute kidney injury -Improved with IV fluids  Normocytic normochromic anemia  -trend    Family Communication/Anticipated D/C date and plan/Code Status   DVT prophylaxis: SCDs Code Status: Full Code.  Family Communication: Patient declined phone call to family Disposition Plan: ERCP planned for today, if  patient does well without complications like pancreatitis suspect can go home tomorrow with close GI follow-up   Medical Consultants:    GI     Subjective:   Denies pain Slept well last night as he was not in the ER  Objective:    Vitals:   05/10/19 1607 05/10/19 2054 05/11/19 0430 05/11/19 1008  BP: 138/74 126/74 113/66 124/69  Pulse: (!) 50 (!) 45 (!) 46 (!) 50  Resp: 18 17 18 16   Temp:  97.6 F (36.4 C) (!) 97.4 F (36.3 C) 98.5 F (36.9 C)  TempSrc:   Oral Oral  SpO2: 98% 99% 100% 100%  Weight:  88.5 kg    Height:        Intake/Output Summary (Last 24 hours) at 05/11/2019 1009 Last data filed at 05/11/2019 0839 Gross per 24 hour  Intake 1952.89 ml  Output 2180 ml  Net -227.11 ml   Filed Weights   05/09/19 1442 05/10/19 2054  Weight: 88.5 kg 88.5 kg    Exam: In bed, no acute distress, alert and oriented x3, pleasant cooperative Regular rate and rhythm Clear to auscultation Positive bowel sounds soft, nontender No lower extremity edema   Data Reviewed:   I have personally reviewed following labs and imaging studies:  Labs: Labs show the following:   Basic Metabolic Panel: Recent Labs  Lab 05/09/19 1451 05/09/19 1451 05/10/19 0621 05/10/19 0621 05/10/19 1441 05/11/19 0516  NA 127*  --  130*  --  124* 130*  K 2.7*   < > 2.2*   < > 3.8 2.4*  CL 88*  --  92*  --  93* 95*  CO2 22  --  24  --  20* 20*  GLUCOSE 117*  --  119*  --  248* 97  BUN 21  --  15  --  11 11  CREATININE 1.41*  --  1.27*  --  1.04 1.15  CALCIUM 10.0  --  9.5  --  8.5* 9.4  MG  --   --  1.8  --   --   --    < > = values in this interval not displayed.   GFR Estimated Creatinine Clearance: 68.2 mL/min (by C-G formula based on SCr of 1.15 mg/dL). Liver Function Tests: Recent Labs  Lab 05/09/19 1924 05/11/19 0516  AST 183* 151*  ALT 176* 156*  ALKPHOS 668* 538*  BILITOT 48.4* 42.3*  PROT 7.8 6.3*  ALBUMIN 2.7* 2.2*   Recent Labs  Lab 05/09/19 1943  LIPASE 22     No results for input(s): AMMONIA in the last 168 hours. Coagulation profile Recent Labs  Lab 05/10/19 1625 05/11/19 0516  INR 2.4* 1.3*    CBC: Recent Labs  Lab 05/09/19 1451 05/10/19 0621 05/11/19 0516  WBC 10.9* 8.6 8.2  NEUTROABS  --   --  5.9  HGB 11.5* 10.6* 10.9*  HCT 33.4* 30.7* 31.9*  MCV 87.0 87.2 88.6  PLT 442* 392 378   Cardiac Enzymes: No results for input(s): CKTOTAL, CKMB, CKMBINDEX, TROPONINI in the last 168 hours. BNP (last 3 results) No results for input(s): PROBNP in the last 8760 hours. CBG: No results for input(s): GLUCAP in the last 168 hours. D-Dimer: No results for input(s): DDIMER in the last 72 hours. Hgb A1c: No results for input(s): HGBA1C in the last 72 hours. Lipid Profile: No results for input(s): CHOL, HDL, LDLCALC, TRIG, CHOLHDL, LDLDIRECT in the last 72 hours. Thyroid function studies: No results for input(s): TSH, T4TOTAL, T3FREE, THYROIDAB in the last 72 hours.  Invalid input(s): FREET3 Anemia work up: No results for input(s): VITAMINB12, FOLATE, FERRITIN, TIBC, IRON, RETICCTPCT in the last 72 hours. Sepsis Labs: Recent Labs  Lab 05/09/19 1451 05/10/19 0621 05/11/19 0516  WBC 10.9* 8.6 8.2    Microbiology Recent Results (from the past 240 hour(s))  SARS CORONAVIRUS 2 (TAT 6-24 HRS) Nasopharyngeal Nasopharyngeal Swab     Status: None   Collection Time: 05/09/19 10:06 PM   Specimen: Nasopharyngeal Swab  Result Value Ref Range Status   SARS Coronavirus 2 NEGATIVE NEGATIVE Final    Comment: (NOTE) SARS-CoV-2 target nucleic acids are NOT DETECTED. The SARS-CoV-2 RNA is generally detectable in upper and lower respiratory specimens during the acute phase of infection. Negative results do not preclude SARS-CoV-2 infection, do not rule out co-infections with other pathogens, and should not be used as the sole basis for treatment or other patient management decisions. Negative results must be combined with clinical  observations, patient history, and epidemiological information. The expected result is Negative. Fact Sheet for Patients: SugarRoll.be Fact Sheet for Healthcare Providers: https://www.woods-mathews.com/ This test is not yet approved or cleared by the [place] FDA and  has been authorized for detection and/or diagnosis of SARS-CoV-2 by FDA under an Emergency Use Authorization (EUA). This EUA will remain  in effect (meaning this test can be used) for the duration of the COVID-19 declaration under Section 56 4(b)(1) of the Act, 21 U.S.C. section 360bbb-3(b)(1), unless the authorization is terminated or revoked sooner. Performed at [facility], [place] [address], [place], [place] [address]     Procedures  and diagnostic studies:  MR ABDOMEN MRCP WO CONTRAST  Result Date: 05/10/2019 CLINICAL DATA:  Biliary obstruction. Jaundice. EXAM: MRI ABDOMEN WITHOUT CONTRAST  (INCLUDING MRCP) TECHNIQUE: Multiplanar multisequence MR imaging of the abdomen was performed. Heavily T2-weighted images of the biliary and pancreatic ducts were obtained, and three-dimensional MRCP images were rendered by post processing. COMPARISON:  Ultrasound 05/09/2019 FINDINGS: Lower chest: The lung bases are grossly clear. No pulmonary lesions, pleural or pericardial effusion. Hepatobiliary: Marked intrahepatic biliary dilatation. No obvious hepatic lesions are identified without contrast. The gallbladder is distended. Small gallstones are noted. There is an abrupt cut off of the common bile duct chest as it enters the region of the pancreatic head. The duct is dilated to 16 mm. No common bile duct stones are identified. Pancreas: Suspect infiltrating pancreatic neoplasm in the pancreatic head obstructing the common bile duct. There is also mild dilatation of the main pancreatic duct and the minor pancreatic duct. Difficult to clearly identify or measure the lesion without contrast.  Spleen:  Upper limits of normal in size. No splenic lesions. Adrenals/Urinary Tract: The adrenal glands are unremarkable. Bilateral renal cysts are noted. Stomach/Bowel: The stomach, duodenum, visualized small bowel and visualize colon are grossly normal. Vascular/Lymphatic:  The aorta is normal in caliber. No definite mesenteric or retroperitoneal adenopathy. Other:  No ascites or abdominal wall hernia. Musculoskeletal: No significant bony findings. IMPRESSION: 1. 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 Difficult to identify for certain without contrast or assess the major vascular structures. Patient may require a pancreatic protocol CT scan. Recommend GI consultation for biliary drainage and probable EUS and biopsy. 2. No obvious adenopathy or metastatic disease. 3. Marked intrahepatic and extrahepatic biliary dilatation. 4. Cholelithiasis. 5. Bilateral renal cysts. Electronically Signed   By: Marijo Sanes M.D.   On: 05/10/2019 07:29   US Abdomen Limited  Result Date: 05/09/2019 CLINICAL DATA:  64 year old male with jaundice and elevated LFTs. EXAM: ULTRASOUND ABDOMEN LIMITED RIGHT UPPER QUADRANT COMPARISON:  None. FINDINGS: Gallbladder: The gallbladder is distended. There is sludge and stones within the gallbladder. No gallbladder wall thickening or pericholecystic fluid. Common bile duct: Diameter: 15 mm. There is dilatation of the common bile duct as well as moderate intrahepatic biliary ductal dilatation. A centrally obstructing stone or mass is not excluded. Further initial evaluation with CT of the abdomen pelvis with IV contrast is recommended. MRCP may provide additional evaluation based on CT findings. Liver: There is mild diffuse increased liver echogenicity most commonly seen in the setting of fatty infiltration. Superimposed inflammation or fibrosis is not excluded. Clinical correlation is recommended. Portal vein is patent  on color Doppler imaging with normal direction of blood flow towards the liver. Other: There is a 2.0 x 1.9 x 1.8 cm hypoechoic lesion in the region of the porta pedis and the head of the pancreas. IMPRESSION: Cholelithiasis with findings concerning for central biliary obstruction. Further evaluation with CT with IV contrast is recommended. No definite sonographic findings of acute cholecystitis. Electronically Signed   By: Anner Crete M.D.   On: 05/09/2019 21:15    Medications:   . amLODipine  10 mg Oral Daily  . [START ON 05/12/2019] atenolol  100 mg Oral Daily  . thiamine injection  100 mg Intravenous Daily   Continuous Infusions: . sodium chloride 75 mL/hr at 05/11/19 0807  . ampicillin-sulbactam (UNASYN) 1.5 g IVPB (Mini-Bag Plus)    . potassium chloride 10 mEq (05/11/19 0918)  LOS: 2 days   Geradine Girt  [facility]   How to contact the [facility] Attending or Consulting provider [place] or covering provider during after hours [place], for this patient?  1. Check the care team in [facility] and look for a) attending/consulting [facility] provider listed and b) the [facility] team listed 2. Log into [email or web address] and use [place]'s universal password to access. If you do not have the password, please contact the hospital operator. 3. Locate the [facility] provider you are looking for under [facility] and page to a number that you can be directly reached. 4. If you still have difficulty reaching the provider, please page the [facility] (Director on Call) for the Hospitalists listed on amion for assistance.  05/11/2019, 10:09 AM

## 2019-05-11 NOTE — Anesthesia Preprocedure Evaluation (Addendum)
Anesthesia Evaluation  Patient identified by MRN, date of birth, ID band Patient awake    Reviewed: Allergy & Precautions, NPO status , Patient's Chart, lab work & pertinent test results  Airway Mallampati: I  TM Distance: >3 FB Neck ROM: Full    Dental   Pulmonary    Pulmonary exam normal        Cardiovascular METS: hypertension, Pt. on medications Normal cardiovascular exam     Neuro/Psych    GI/Hepatic   Endo/Other    Renal/GU      Musculoskeletal   Abdominal   Peds  Hematology   Anesthesia Other Findings   Reproductive/Obstetrics                           Anesthesia Physical Anesthesia Plan  ASA: III  Anesthesia Plan: General   Post-op Pain Management:    Induction: Intravenous  PONV Risk Score and Plan: Ondansetron and Treatment may vary due to age or medical condition  Airway Management Planned: Oral ETT  Additional Equipment:   Intra-op Plan:   Post-operative Plan: Extubation in OR  Informed Consent: I have reviewed the patients History and Physical, chart, labs and discussed the procedure including the risks, benefits and alternatives for the proposed anesthesia with the patient or authorized representative who has indicated his/her understanding and acceptance.       Plan Discussed with: CRNA and Surgeon  Anesthesia Plan Comments:       Anesthesia Quick Evaluation

## 2019-05-11 NOTE — Plan of Care (Signed)
  Problem: Education: Goal: Knowledge of General Education information will improve Description Including pain rating scale, medication(s)/side effects and non-pharmacologic comfort measures Outcome: Progressing   

## 2019-05-11 NOTE — Anesthesia Postprocedure Evaluation (Signed)
Anesthesia Post Note  Patient: Isaac Doyle  Procedure(s) Performed: ENDOSCOPIC RETROGRADE CHOLANGIOPANCREATOGRAPHY (ERCP) (N/A ) [place]     Patient location during evaluation: PACU Anesthesia Type: General Level of consciousness: awake and alert Pain management: pain level controlled Vital Signs Assessment: post-procedure vital signs reviewed and stable Respiratory status: spontaneous breathing, nonlabored ventilation, respiratory function stable and patient connected to nasal cannula oxygen Cardiovascular status: stable and blood pressure returned to baseline Postop Assessment: no apparent nausea or vomiting Anesthetic complications: no    Last Vitals:  Vitals:   05/11/19 1649 05/11/19 1705  BP: (!) 142/63 119/69  Pulse: 61 (!) 53  Resp: 19 17  Temp: 36.6 C   SpO2: 100% 100%    Last Pain:  Vitals:   05/11/19 1705  TempSrc:   PainSc: 0-No pain                 Arisbel Maione DAVID

## 2019-05-11 NOTE — Transfer of Care (Signed)
Immediate Anesthesia Transfer of Care Note  Patient: Isaac Doyle  Procedure(s) Performed: ENDOSCOPIC RETROGRADE CHOLANGIOPANCREATOGRAPHY (ERCP) (N/A ) SPHINCTEROTOMY BILIARY STENT PLACEMENT  Patient Location: PACU  Anesthesia Type:General  Level of Consciousness: awake, alert  and patient cooperative  Airway & Oxygen Therapy: Patient Spontanous Breathing  Post-op Assessment: Report given to RN and Post -op Vital signs reviewed and stable  Post vital signs: Reviewed and stable  Last Vitals:  Vitals Value Taken Time  BP    Temp    Pulse    Resp    SpO2      Last Pain:  Vitals:   05/11/19 1432  TempSrc: Oral  PainSc: 0-No pain         Complications: No apparent anesthesia complications

## 2019-05-11 NOTE — Op Note (Signed)
[facility] Patient Name: Isaac Doyle Procedure Date : 05/11/2019 MRN: [id number] Attending MD: Docia Chuck. Henrene Pastor , MD Date of Birth: 11-08-1955 CSN: [id number] Age: 64 Admit Type: Inpatient Procedure:                ERCP with biliary sphincterotomy and biliary stent                            placement Indications:              Malignant stricture of the common bile duct,                            Abnormal MRCP, Jaundice, Bile duct stricture Providers:                Docia Chuck. Henrene Pastor, MD, Glori Bickers, RN, Jeanella Cara, RN, Lazaro Arms, Technician Referring MD:             [facility] Medicines:                General Anesthesia Complications:            No immediate complications. Estimated Blood Loss:     Estimated blood loss: none. Procedure:                Pre-Anesthesia Assessment:                           - Prior to the procedure, a History and Physical                            was performed, and patient medications and                            allergies were reviewed. The patient's tolerance of                            previous anesthesia was also reviewed. The risks                            and benefits of the procedure and the sedation                            options and risks were discussed with the patient.                            All questions were answered, and informed consent                            was obtained. Prior Anticoagulants: The patient has                            taken no previous anticoagulant or antiplatelet  agents. ASA Grade Assessment: II - A patient with                            mild systemic disease. After reviewing the risks                            and benefits, the patient was deemed in                            satisfactory condition to undergo the procedure.                           After obtaining informed consent, the scope was                   passed under direct vision. Throughout the                            procedure, the patient's blood pressure, pulse, and                            oxygen saturations were monitored continuously. The                            [id number]) Olympus Duodensocope was                            introduced through the mouth, and used to inject                            contrast into and used to inject contrast into the                            bile duct and ventral pancreatic duct. The ERCP was                            accomplished without difficulty. The patient                            tolerated the procedure well. Scope In: Scope Out: Findings:      1. The endoscope was passed blindly into the esophagus. The stomach and       duodenum were grossly normal      2. The major ampulla complex was deformed, hypervascular, and friable      3. Initial passage of the guidewire was into the pancreatic duct.       Subsequent injection of contrast opacified the bile duct. The procedure       was somewhat more difficult and that the ampulla was high riding and       required the long scope position      4. There was a 2 cm malignant appearing distal biliary stricture. The       biliary tree was markedly dilated above this region.      5. A biliary sphincterotomy was performed to ensure easier future duct       access  6. A 10 French in diameter, 7 cm in length plastic biliary       endoprosthesis with proximal and distal flaps was placed into the bile       duct in good position both proximally and distally. Drainage of dark       bile was excellent.      Note: No attempted biliary brushings was made due to the vulnerable (to       movement) scope position in the duodenum. Impression:               1. Malignant appearing distal bile duct stricture                            with upstream dilation. Status post ERCP with                            sphincterotomy and  biliary stent placement Recommendation:           1. Standard post ERCP observation and treatment                           2. Trend liver tests                           3. Will need endoscopic ultrasound with biopsies                            and staging. Inpatient GI team aware and will                            arrange and continue to follow the patient in-house. Procedure Code(s):        --- Professional ---                           [id number], Endoscopic retrograde                            cholangiopancreatography (ERCP); with placement of                            endoscopic stent into biliary or pancreatic duct,                            including pre- and post-dilation and guide wire                            passage, when performed, including sphincterotomy,                            when performed, each stent Diagnosis Code(s):        --- Professional ---                           K83.1, Obstruction of bile duct                           R17, Unspecified jaundice  R93.2, Abnormal findings on diagnostic imaging of                            liver and biliary tract CPT copyright 2019 American Medical Association. All rights reserved. The codes documented in this report are preliminary and upon coder review may  be revised to meet current compliance requirements. Docia Chuck. Henrene Pastor, MD 05/11/2019 4:52:10 PM This report has been signed electronically. Number of Addenda: 0

## 2019-05-11 NOTE — Anesthesia Procedure Notes (Signed)
Procedure Name: Intubation Date/Time: 05/11/2019 3:42 PM Performed by: Janace Litten, CRNA Pre-anesthesia Checklist: Patient identified, Emergency Drugs available, Suction available and Patient being monitored Patient Re-evaluated:Patient Re-evaluated prior to induction Oxygen Delivery Method: Circle System Utilized Preoxygenation: Pre-oxygenation with 100% oxygen Induction Type: IV induction Ventilation: Mask ventilation without difficulty Laryngoscope Size: Mac and 4 Grade View: Grade I Tube type: Oral Tube size: 7.5 mm Number of attempts: 1 Airway Equipment and Method: Stylet Placement Confirmation: ETT inserted through vocal cords under direct vision,  positive ETCO2 and breath sounds checked- equal and bilateral Secured at: 23 cm Tube secured with: Tape Dental Injury: Teeth and Oropharynx as per pre-operative assessment

## 2019-05-11 NOTE — Progress Notes (Addendum)
Patient ID: Isaac Doyle, male   DOB: [birth year], 64 y.o.   MRN: [id number]    Progress Note   Subjective   Day# 2  CC; jaundice  Patient says he feels okay, no specific complaints, some burning with potassium runs but tolerating well.  INR 2.4 yesterday - corrected to 1.3 this am WBC 8.2, hgb 10.9 K+ 2.4 despite replacement yesterday - 6 runs ordered for this am  Tbili 42/alk phosp 538/ast 151/alt156   CA19,-0  =2411 CEA 31.1   Objective   Vital signs in last 24 hours: Temp:  [97.4 F (36.3 C)-97.6 F (36.4 C)] 97.4 F (36.3 C) (04/07 0430) Pulse Rate:  [45-97] 46 (04/07 0430) Resp:  [15-20] 18 (04/07 0430) BP: (112-138)/(66-90) 113/66 (04/07 0430) SpO2:  [98 %-100 %] 100 % (04/07 0430) Weight:  [88.5 kg] 88.5 kg (04/06 2054) Last BM Date: 05/09/19 General:     AA male  in NAD, very jaundiced Heart:  Regular rate and rhythm; no murmurs Lungs: Respirations even and unlabored, lungs CTA bilaterally Abdomen:  Soft, nontender and nondistended. Normal bowel sounds. Extremities:  Without edema. Neurologic:  Alert and oriented,  grossly normal neurologically. Psych:  Cooperative. Normal mood and affect.  Intake/Output from previous day: 04/06 0701 - 04/07 0700 In: 1802 [P.O.:180; I.V.:1147.5; IV Piggyback:474.5] Out: 2180 [Urine:2180] Intake/Output this shift: Total I/O In: 150.9 [I.V.:150.9] Out: -   Lab Results: Recent Labs    05/09/19 1451 05/10/19 0621 05/11/19 0516  WBC 10.9* 8.6 8.2  HGB 11.5* 10.6* 10.9*  HCT 33.4* 30.7* 31.9*  PLT 442* 392 378   BMET Recent Labs    05/10/19 0621 05/10/19 1441 05/11/19 0516  NA 130* 124* 130*  K 2.2* 3.8 2.4*  CL 92* 93* 95*  CO2 24 20* 20*  GLUCOSE 119* 248* 97  BUN '15 11 11  ' CREATININE 1.27* 1.04 1.15  CALCIUM 9.5 8.5* 9.4   LFT Recent Labs    05/09/19 1924 05/09/19 1924 05/11/19 0516  PROT 7.8   < > 6.3*  ALBUMIN 2.7*   < > 2.2*  AST 183*   < > 151*  ALT 176*   < > 156*  ALKPHOS 668*   < >  538*  BILITOT 48.4*   < > 42.3*  BILIDIR 31.1*  --   --   IBILI 17.3*  --   --    < > = values in this interval not displayed.   PT/INR Recent Labs    05/10/19 1625 05/11/19 0516  LABPROT 25.9* 16.4*  INR 2.4* 1.3*    Studies/Results: MR ABDOMEN MRCP WO CONTRAST  Result Date: 05/10/2019 CLINICAL DATA:  Biliary obstruction. Jaundice. EXAM: MRI ABDOMEN WITHOUT CONTRAST  (INCLUDING MRCP) TECHNIQUE: Multiplanar multisequence MR imaging of the abdomen was performed. Heavily T2-weighted images of the biliary and pancreatic ducts were obtained, and three-dimensional MRCP images were rendered by post processing. COMPARISON:  Ultrasound 05/09/2019 FINDINGS: Lower chest: The lung bases are grossly clear. No pulmonary lesions, pleural or pericardial effusion. Hepatobiliary: Marked intrahepatic biliary dilatation. No obvious hepatic lesions are identified without contrast. The gallbladder is distended. Small gallstones are noted. There is an abrupt cut off of the common bile duct chest as it enters the region of the pancreatic head. The duct is dilated to 16 mm. No common bile duct stones are identified. Pancreas: Suspect infiltrating pancreatic neoplasm in the pancreatic head obstructing the common bile duct. There is also mild dilatation of the main pancreatic duct and the minor pancreatic  duct. Difficult to clearly identify or measure the lesion without contrast. Spleen:  Upper limits of normal in size. No splenic lesions. Adrenals/Urinary Tract: The adrenal glands are unremarkable. Bilateral renal cysts are noted. Stomach/Bowel: The stomach, duodenum, visualized small bowel and visualize colon are grossly normal. Vascular/Lymphatic:  The aorta is normal in caliber. No definite mesenteric or retroperitoneal adenopathy. Other:  No ascites or abdominal wall hernia. Musculoskeletal: No significant bony findings. IMPRESSION: 1. 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 Difficult to identify for certain without contrast or assess the major vascular structures. Patient may require a pancreatic protocol CT scan. Recommend GI consultation for biliary drainage and probable EUS and biopsy. 2. No obvious adenopathy or metastatic disease. 3. Marked intrahepatic and extrahepatic biliary dilatation. 4. Cholelithiasis. 5. Bilateral renal cysts. Electronically Signed   By: Marijo Sanes M.D.   On: 05/10/2019 07:29   US Abdomen Limited  Result Date: 05/09/2019 CLINICAL DATA:  64 year old male with jaundice and elevated LFTs. EXAM: ULTRASOUND ABDOMEN LIMITED RIGHT UPPER QUADRANT COMPARISON:  None. FINDINGS: Gallbladder: The gallbladder is distended. There is sludge and stones within the gallbladder. No gallbladder wall thickening or pericholecystic fluid. Common bile duct: Diameter: 15 mm. There is dilatation of the common bile duct as well as moderate intrahepatic biliary ductal dilatation. A centrally obstructing stone or mass is not excluded. Further initial evaluation with CT of the abdomen pelvis with IV contrast is recommended. MRCP may provide additional evaluation based on CT findings. Liver: There is mild diffuse increased liver echogenicity most commonly seen in the setting of fatty infiltration. Superimposed inflammation or fibrosis is not excluded. Clinical correlation is recommended. Portal vein is patent on color Doppler imaging with normal direction of blood flow towards the liver. Other: There is a 2.0 x 1.9 x 1.8 cm hypoechoic lesion in the region of the porta pedis and the head of the pancreas. IMPRESSION: Cholelithiasis with findings concerning for central biliary obstruction. Further evaluation with CT with IV contrast is recommended. No definite sonographic findings of acute cholecystitis. Electronically Signed   By: Anner Crete M.D.   On: 05/09/2019 21:15       Assessment / Plan:    #76 64 year old African-American male, admitted  with 3-week history of progressive painless jaundice, poor appetite and weight loss. Found to have T bili of 48. 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 He also has cholelithiasis. CA 19-9 markedly elevated No lymphadenopathy or obvious metastatic disease on MRI  #2 severe hypokalemia-he has received multiple runs and still had potassium of 2.4 this morning. Received magnesium last p.m. He is receiving 6 runs of 10 mEq KCl this a.m.  #3 coagulopathy-secondary to hepatic dysfunction-corrected/INR 1.3 #4 history of hypertension #5 normocytic anemia  Plan; patient is scheduled for ERCP with brushings and stent placement this afternoon with Dr. Scarlette Shorts.  All of patient's questions have been answered.  We will check stat potassium at noon, preop-Hopefully will he will have had 4 runs in the point    Principal Problem:   Jaundice Active Problems:   ARF (acute renal failure) (HCC)   Essential hypertension   Hypokalemia   Normochromic normocytic anemia   Malignant biliary obstruction (HCC)     LOS: 2 days   Amy EsterwoodPA-C  05/11/2019, 8:39 AM  GI ATTENDING  History, laboratories, x-rays reviewed.  Patient seen and examined.  Agree with interval progress note as outlined above.  Patient  is for ERCP to address biliary obstruction likely secondary to pancreatic cancer.The nature of the procedure, as well as the risks (including but not limited to pancreatitis, perforation, bleeding, infection), benefits, and alternatives were carefully and thoroughly reviewed with the patient. Ample time for discussion and questions allowed. The patient understood, was satisfied, and agreed to proceed.  Docia Chuck. Geri Seminole., M.D. [facility] Division of Gastroenterology

## 2019-05-12 DIAGNOSIS — C801 Malignant (primary) neoplasm, unspecified: Secondary | ICD-10-CM

## 2019-05-12 DIAGNOSIS — K831 Obstruction of bile duct: Principal | ICD-10-CM

## 2019-05-12 LAB — CBC WITH DIFFERENTIAL/PLATELET
Abs Immature Granulocytes: 0.04 10*3/uL (ref 0.00–0.07)
Basophils Absolute: 0 10*3/uL (ref 0.0–0.1)
Basophils Relative: 0 %
Eosinophils Absolute: 0 10*3/uL (ref 0.0–0.5)
Eosinophils Relative: 0 %
HCT: 32.4 % — ABNORMAL LOW (ref 39.0–52.0)
Hemoglobin: 11.3 g/dL — ABNORMAL LOW (ref 13.0–17.0)
Immature Granulocytes: 1 %
Lymphocytes Relative: 9 %
Lymphs Abs: 0.8 10*3/uL (ref 0.7–4.0)
MCH: 30.7 pg (ref 26.0–34.0)
MCHC: 34.9 g/dL (ref 30.0–36.0)
MCV: 88 fL (ref 80.0–100.0)
Monocytes Absolute: 0.2 10*3/uL (ref 0.1–1.0)
Monocytes Relative: 3 %
Neutro Abs: 7.4 10*3/uL (ref 1.7–7.7)
Neutrophils Relative %: 87 %
Platelets: 420 10*3/uL — ABNORMAL HIGH (ref 150–400)
RBC: 3.68 MIL/uL — ABNORMAL LOW (ref 4.22–5.81)
RDW: 16.1 % — ABNORMAL HIGH (ref 11.5–15.5)
WBC: 8.4 10*3/uL (ref 4.0–10.5)
nRBC: 0 % (ref 0.0–0.2)

## 2019-05-12 LAB — COMPREHENSIVE METABOLIC PANEL
ALT: 139 U/L — ABNORMAL HIGH (ref 0–44)
AST: 118 U/L — ABNORMAL HIGH (ref 15–41)
Albumin: 2.2 g/dL — ABNORMAL LOW (ref 3.5–5.0)
Alkaline Phosphatase: 490 U/L — ABNORMAL HIGH (ref 38–126)
Anion gap: 13 (ref 5–15)
BUN: 17 mg/dL (ref 8–23)
CO2: 20 mmol/L — ABNORMAL LOW (ref 22–32)
Calcium: 9.3 mg/dL (ref 8.9–10.3)
Chloride: 95 mmol/L — ABNORMAL LOW (ref 98–111)
Creatinine, Ser: 1.37 mg/dL — ABNORMAL HIGH (ref 0.61–1.24)
GFR calc Af Amer: >60 mL/min (ref >60)
GFR calc non Af Amer: 54 mL/min — ABNORMAL LOW (ref >60)
Glucose, Bld: 153 mg/dL — ABNORMAL HIGH (ref 70–99)
Potassium: 3.1 mmol/L — ABNORMAL LOW (ref 3.5–5.1)
Sodium: 128 mmol/L — ABNORMAL LOW (ref 135–145)
Total Bilirubin: 39.7 mg/dL (ref 0.3–1.2)
Total Protein: 6.4 g/dL — ABNORMAL LOW (ref 6.5–8.1)

## 2019-05-12 MED ORDER — POTASSIUM CHLORIDE CRYS ER 20 MEQ PO TBCR
40.0000 meq | EXTENDED_RELEASE_TABLET | Freq: Once | ORAL | Status: AC
  Administered 2019-05-12: 40 meq via ORAL
  Filled 2019-05-12: qty 2

## 2019-05-12 MED ORDER — THIAMINE HCL 100 MG PO TABS
100.0000 mg | ORAL_TABLET | Freq: Every day | ORAL | Status: DC
  Administered 2019-05-12 – 2019-05-13 (×2): 100 mg via ORAL
  Filled 2019-05-12 (×3): qty 1

## 2019-05-12 MED ORDER — MAGNESIUM SULFATE 2 GM/50ML IV SOLN
2.0000 g | Freq: Once | INTRAVENOUS | Status: AC
  Administered 2019-05-12: 2 g via INTRAVENOUS
  Filled 2019-05-12: qty 50

## 2019-05-12 NOTE — Plan of Care (Signed)
  Problem: Nutrition: Goal: Adequate nutrition will be maintained Outcome: Progressing   Problem: Activity: Goal: Risk for activity intolerance will decrease Outcome: Progressing

## 2019-05-12 NOTE — Progress Notes (Signed)
Progress Note    Isaac Doyle  [id number] DOB: 12-21-1955  DOA: 05/09/2019 PCP: System, Pcp Not In    Brief Narrative:     Medical records reviewed and are as summarized below:  Deondrick Haseley is an 64 y.o. male with past medical history of hypertension, possible alcohol abuse, presents to the ER complaining of jaundice, dark urine and poor appetite for the past 2 weeks.  [facility] and was found to have abnormal LFTs and referred to the ER.  Labs notable for elevated liver enzymes, with total bili of about 48, and hypokalemia.  Abdominal ultrasound showed cholelithiasis but no findings of acute cholecystitis.  MRCP showed possible mass in the pancreatic head.  GI consulted, plan for ERCP on 05/11/2019.  Assessment/Plan:   Principal Problem:   Jaundice Active Problems:   ARF (acute renal failure) (HCC)   Essential hypertension   Hypokalemia   Normochromic normocytic anemia   Malignant biliary obstruction (HCC)   Biliary stricture     Obstructive jaundice: Likely mass in the pancreatic head -GI consult -MRCP:Suspect infiltrating pancreatic head neoplasm obstructing the common bile duct as it enters the pancreatic head. Difficult to identify for certain without contrast or assess the major vascular structures. Patient may require a pancreatic protocol CT scan. Recommend GI consultation for biliary drainage and probable EUS and biopsy. -ERCP  4/7: 1. Malignant appearing distal bile duct stricture with upstream dilation. Status post ERCP with sphincterotomy and biliary stent placement.  Will need endoscopic ultrasound with biopsies and staging.  Elevated LFTs likely from obstruction -trending down  Severe hypokalemia -Repleted along with magnesium  Hypertension -Decrease atenolol due to bradycardia  History of chronic pain  -takes sulindac.  Acute kidney injury -Improved with IV fluids initially -good urine output (dark from bilirubin)  Normocytic  normochromic anemia  -trend    Family Communication/Anticipated D/C date and plan/Code Status   DVT prophylaxis: SCDs Code Status: Full Code.  Family Communication: Patient declined phone call to family Disposition Plan: per GI note, patient may be ready for d/c in the AM   Medical Consultants:    GI     Subjective:   Eating well, says he feels much better  Objective:    Vitals:   05/11/19 1725 05/11/19 2110 05/12/19 0535 05/12/19 0933  BP: 125/64 113/77 121/65 115/60  Pulse: (!) 49 67 (!) 49 (!) 55  Resp: 18 18 18 18   Temp: 97.7 F (36.5 C) 97.9 F (36.6 C) (!) 97.5 F (36.4 C) 97.8 F (36.6 C)  TempSrc:  Oral  Oral  SpO2: 100% 100% 100% 100%  Weight:  88.5 kg    Height:        Intake/Output Summary (Last 24 hours) at 05/12/2019 1121 Last data filed at 05/12/2019 1053 Gross per 24 hour  Intake 2592.41 ml  Output 1960 ml  Net 632.41 ml   Filed Weights   05/10/19 2054 05/11/19 1432 05/11/19 2110  Weight: 88.5 kg 88.5 kg 88.5 kg    Exam: In bed, doing a word search rrr No increased work of breathing +BS, soft A+OX3  Data Reviewed:   I have personally reviewed following labs and imaging studies:  Labs: Labs show the following:   Basic Metabolic Panel: Recent Labs  Lab 05/09/19 1451 05/09/19 1451 05/10/19 0621 05/10/19 0621 05/10/19 1441 05/10/19 1441 05/11/19 0516 05/11/19 0516 05/11/19 1134 05/12/19 0345  NA 127*  --  130*  --  124*  --  130*  --   --  128*  K 2.7*   < > 2.2*   < > 3.8   < > 2.4*   < > 2.9* 3.1*  CL 88*  --  92*  --  93*  --  95*  --   --  95*  CO2 22  --  24  --  20*  --  20*  --   --  20*  GLUCOSE 117*  --  119*  --  248*  --  97  --   --  153*  BUN 21  --  15  --  11  --  11  --   --  17  CREATININE 1.41*  --  1.27*  --  1.04  --  1.15  --   --  1.37*  CALCIUM 10.0  --  9.5  --  8.5*  --  9.4  --   --  9.3  MG  --   --  1.8  --   --   --  1.6*  --   --   --   PHOS  --   --   --   --   --   --  4.9*  --   --   --     < > = values in this interval not displayed.   GFR Estimated Creatinine Clearance: 57.3 mL/min (A) (by C-G formula based on SCr of 1.37 mg/dL (H)). Liver Function Tests: Recent Labs  Lab 05/09/19 1924 05/11/19 0516 05/12/19 0345  AST 183* 151* 118*  ALT 176* 156* 139*  ALKPHOS 668* 538* 490*  BILITOT 48.4* 42.3* 39.7*  PROT 7.8 6.3* 6.4*  ALBUMIN 2.7* 2.2* 2.2*   Recent Labs  Lab 05/09/19 1943  LIPASE 22   No results for input(s): AMMONIA in the last 168 hours. Coagulation profile Recent Labs  Lab 05/10/19 1625 05/11/19 0516  INR 2.4* 1.3*    CBC: Recent Labs  Lab 05/09/19 1451 05/10/19 0621 05/11/19 0516 05/12/19 0345  WBC 10.9* 8.6 8.2 8.4  NEUTROABS  --   --  5.9 7.4  HGB 11.5* 10.6* 10.9* 11.3*  HCT 33.4* 30.7* 31.9* 32.4*  MCV 87.0 87.2 88.6 88.0  PLT 442* 392 378 420*   Cardiac Enzymes: No results for input(s): CKTOTAL, CKMB, CKMBINDEX, TROPONINI in the last 168 hours. BNP (last 3 results) No results for input(s): PROBNP in the last 8760 hours. CBG: No results for input(s): GLUCAP in the last 168 hours. D-Dimer: No results for input(s): DDIMER in the last 72 hours. Hgb A1c: No results for input(s): HGBA1C in the last 72 hours. Lipid Profile: No results for input(s): CHOL, HDL, LDLCALC, TRIG, CHOLHDL, LDLDIRECT in the last 72 hours. Thyroid function studies: No results for input(s): TSH, T4TOTAL, T3FREE, THYROIDAB in the last 72 hours.  Invalid input(s): FREET3 Anemia work up: No results for input(s): VITAMINB12, FOLATE, FERRITIN, TIBC, IRON, RETICCTPCT in the last 72 hours. Sepsis Labs: Recent Labs  Lab 05/09/19 1451 05/10/19 0621 05/11/19 0516 05/12/19 0345  WBC 10.9* 8.6 8.2 8.4    Microbiology Recent Results (from the past 240 hour(s))  SARS CORONAVIRUS 2 (TAT 6-24 HRS) Nasopharyngeal Nasopharyngeal Swab     Status: None   Collection Time: 05/09/19 10:06 PM   Specimen: Nasopharyngeal Swab  Result Value Ref Range Status   SARS  Coronavirus 2 NEGATIVE NEGATIVE Final    Comment: (NOTE) SARS-CoV-2 target nucleic acids are NOT DETECTED. The SARS-CoV-2 RNA is generally  detectable in upper and lower respiratory specimens during the acute phase of infection. Negative results do not preclude SARS-CoV-2 infection, do not rule out co-infections with other pathogens, and should not be used as the sole basis for treatment or other patient management decisions. Negative results must be combined with clinical observations, patient history, and epidemiological information. The expected result is Negative. Fact Sheet for Patients: SugarRoll.be Fact Sheet for Healthcare Providers: https://www.woods-mathews.com/ This test is not yet approved or cleared by the [place] FDA and  has been authorized for detection and/or diagnosis of SARS-CoV-2 by FDA under an Emergency Use Authorization (EUA). This EUA will remain  in effect (meaning this test can be used) for the duration of the COVID-19 declaration under Section 56 4(b)(1) of the Act, 21 U.S.C. section 360bbb-3(b)(1), unless the authorization is terminated or revoked sooner. Performed at [facility], [place] [address], [place], [place] [address]     Procedures and diagnostic studies:  DG ERCP BILIARY & PANCREATIC DUCTS  Result Date: 05/11/2019 CLINICAL DATA:  64 year old male with biliary obstruction EXAM: ERCP TECHNIQUE: Multiple spot images obtained with the fluoroscopic device and submitted for interpretation post-procedure. FLUOROSCOPY TIME:  Fluoroscopy Time:  6 minutes 57 seconds COMPARISON:  MR 05/10/2019 FINDINGS: Limited intraoperative fluoroscopic spot images performed. Initial image demonstrates endoscope projecting over the upper abdomen. There is subsequently cannulation of the ampulla with retrograde infusion of contrast. Ossification of the extrahepatic biliary ducts with dilation. Final image demonstrates placement  of a plastic biliary stent. IMPRESSION: Limited images during ERCP demonstrates treatment of extrahepatic biliary system with deployment of plastic biliary stent. Please refer to the dictated operative report for full details of intraoperative findings and procedure. Electronically Signed   By: Corrie Mckusick D.O.   On: 05/11/2019 17:07    Medications:   . amLODipine  10 mg Oral Daily  . atenolol  100 mg Oral Daily  . indomethacin  100 mg Rectal Once  . potassium chloride  40 mEq Oral Once  . thiamine  100 mg Oral Daily   Continuous Infusions:    LOS: 3 days   Geradine Girt  [facility]   How to contact the [facility] Attending or Consulting provider [place] or covering provider during after hours [place], for this patient?  1. Check the care team in [facility] and look for a) attending/consulting [facility] provider listed and b) the [facility] team listed 2. Log into [email or web address] and use [place]'s universal password to access. If you do not have the password, please contact the hospital operator. 3. Locate the [facility] provider you are looking for under [facility] and page to a number that you can be directly reached. 4. If you still have difficulty reaching the provider, please page the [facility] (Director on Call) for the Hospitalists listed on amion for assistance.  05/12/2019, 11:20 AM

## 2019-05-12 NOTE — Progress Notes (Signed)
Daily Rounding Note  05/12/2019, 8:25 AM  LOS: 3 days   SUBJECTIVE:   Chief complaint: Obstructive jaundice. Patient feeling better.  More energy.  Pruritus resolved.  Appetite improved.  Never has had abdominal pain. Abundant urine. Has not been walking around in the hallways but has gotten up to go to the bathroom.  OBJECTIVE:         Vital signs in last 24 hours:    Temp:  [97.5 F (36.4 C)-98.5 F (36.9 C)] 97.5 F (36.4 C) (04/08 0535) Pulse Rate:  [49-67] 49 (04/08 0535) Resp:  [14-19] 18 (04/08 0535) BP: (113-142)/(63-77) 121/65 (04/08 0535) SpO2:  [99 %-100 %] 100 % (04/08 0535) Weight:  [88.5 kg] 88.5 kg (04/07 2110) Last BM Date: 05/11/19 Filed Weights   05/10/19 2054 05/11/19 1432 05/11/19 2110  Weight: 88.5 kg 88.5 kg 88.5 kg   General: Pleasant, looks a bit older than stated age.  Scleral icterus. Heart: RRR. Chest: Clear bilaterally.  No labored breathing or cough. Abdomen: Soft, nontender, nondistended.  No masses.  Active bowel sounds. Extremities: No CCE. Neuro/Psych: No tremors, no limb weakness.  Fluid speech.  Intake/Output from previous day: 04/07 0701 - 04/08 0700 In: 2623.3 [I.V.:1934.6; IV Piggyback:688.7] Out: 2010 [Urine:2000; Blood:10]  Intake/Output this shift: No intake/output data recorded.  Lab Results: Recent Labs    05/10/19 0621 05/11/19 0516 05/12/19 0345  WBC 8.6 8.2 8.4  HGB 10.6* 10.9* 11.3*  HCT 30.7* 31.9* 32.4*  PLT 392 378 420*   BMET Recent Labs    05/10/19 1441 05/10/19 1441 05/11/19 0516 05/11/19 1134 05/12/19 0345  NA 124*  --  130*  --  128*  K 3.8   < > 2.4* 2.9* 3.1*  CL 93*  --  95*  --  95*  CO2 20*  --  20*  --  20*  GLUCOSE 248*  --  97  --  153*  BUN 11  --  11  --  17  CREATININE 1.04  --  1.15  --  1.37*  CALCIUM 8.5*  --  9.4  --  9.3   < > = values in this interval not displayed.   LFT Recent Labs    05/09/19 1924  05/11/19 0516 05/12/19 0345  PROT 7.8 6.3* 6.4*  ALBUMIN 2.7* 2.2* 2.2*  AST 183* 151* 118*  ALT 176* 156* 139*  ALKPHOS 668* 538* 490*  BILITOT 48.4* 42.3* 39.7*  BILIDIR 31.1*  --   --   IBILI 17.3*  --   --    PT/INR Recent Labs    05/10/19 1625 05/11/19 0516  LABPROT 25.9* 16.4*  INR 2.4* 1.3*   Hepatitis Panel Recent Labs    05/10/19 0621  HEPBSAG NON REACTIVE  HCVAB NON REACTIVE  HEPAIGM NON REACTIVE  HEPBIGM NON REACTIVE    Studies/Results: DG ERCP BILIARY & PANCREATIC DUCTS  Result Date: 05/11/2019 CLINICAL DATA:  64 year old male with biliary obstruction EXAM: ERCP TECHNIQUE: Multiple spot images obtained with the fluoroscopic device and submitted for interpretation post-procedure. FLUOROSCOPY TIME:  Fluoroscopy Time:  6 minutes 57 seconds COMPARISON:  MR 05/10/2019 FINDINGS: Limited intraoperative fluoroscopic spot images performed. Initial image demonstrates endoscope projecting over the upper abdomen. There is subsequently cannulation of the ampulla with retrograde infusion of contrast. Ossification of the extrahepatic biliary ducts with dilation. Final image demonstrates placement of a plastic biliary stent. IMPRESSION: Limited images during ERCP demonstrates treatment of extrahepatic biliary system with  deployment of plastic biliary stent. Please refer to the dictated operative report for full details of intraoperative findings and procedure. Electronically Signed   By: Corrie Mckusick D.O.   On: 05/11/2019 17:07   Scheduled Meds: . amLODipine  10 mg Oral Daily  . atenolol  100 mg Oral Daily  . indomethacin  100 mg Rectal Once  . potassium chloride  40 mEq Oral Once  . thiamine  100 mg Oral Daily   Continuous Infusions: . sodium chloride 50 mL/hr at 05/12/19 0630  . magnesium sulfate bolus IVPB     PRN Meds:.   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   Sphincterotomy performed, biliary stent placed.  No brush cytology is performed.  LFTs improved.  *    Coagulopathy.  Improved.  *    Hyponatremia  *    Hypokalemia.  *    Hypomagnesemia.   PLAN   *   Will eventually require EUS for staging and biopsies.  Need to finalize timing of these.  It is possible patient will go home and return for outpatient EUS.  Clinically he is improving and could be ready for discharge as soon as tomorrow.    Azucena Freed  05/12/2019, 8:25 AM Phone [phone]

## 2019-05-12 NOTE — Plan of Care (Signed)
  Problem: Activity: Goal: Risk for activity intolerance will decrease Outcome: Progressing   

## 2019-05-12 NOTE — Plan of Care (Signed)
  Problem: Education: Goal: Knowledge of General Education information will improve Description Including pain rating scale, medication(s)/side effects and non-pharmacologic comfort measures Outcome: Progressing   Problem: Health Behavior/Discharge Planning: Goal: Ability to manage health-related needs will improve Outcome: Progressing   

## 2019-05-12 NOTE — Plan of Care (Signed)
  Problem: Education: Goal: Knowledge of General Education information will improve Description: Including pain rating scale, medication(s)/side effects and non-pharmacologic comfort measures Outcome: Progressing   Problem: Activity: Goal: Risk for activity intolerance will decrease Outcome: Progressing   Problem: Nutrition: Goal: Adequate nutrition will be maintained Outcome: Progressing   

## 2019-05-13 ENCOUNTER — Telehealth: Payer: Self-pay

## 2019-05-13 ENCOUNTER — Inpatient Hospital Stay (HOSPITAL_COMMUNITY): Payer: No Typology Code available for payment source

## 2019-05-13 LAB — CBC WITH DIFFERENTIAL/PLATELET
Abs Immature Granulocytes: 0.08 K/uL — ABNORMAL HIGH (ref 0.00–0.07)
Basophils Absolute: 0 K/uL (ref 0.0–0.1)
Basophils Relative: 0 %
Eosinophils Absolute: 0.1 K/uL (ref 0.0–0.5)
Eosinophils Relative: 1 %
HCT: 28.1 % — ABNORMAL LOW (ref 39.0–52.0)
Hemoglobin: 9.6 g/dL — ABNORMAL LOW (ref 13.0–17.0)
Immature Granulocytes: 1 %
Lymphocytes Relative: 20 %
Lymphs Abs: 2.3 K/uL (ref 0.7–4.0)
MCH: 30.9 pg (ref 26.0–34.0)
MCHC: 34.2 g/dL (ref 30.0–36.0)
MCV: 90.4 fL (ref 80.0–100.0)
Monocytes Absolute: 0.7 K/uL (ref 0.1–1.0)
Monocytes Relative: 6 %
Neutro Abs: 8.1 K/uL — ABNORMAL HIGH (ref 1.7–7.7)
Neutrophils Relative %: 72 %
Platelets: 411 K/uL — ABNORMAL HIGH (ref 150–400)
RBC: 3.11 MIL/uL — ABNORMAL LOW (ref 4.22–5.81)
RDW: 16.2 % — ABNORMAL HIGH (ref 11.5–15.5)
WBC: 11.3 K/uL — ABNORMAL HIGH (ref 4.0–10.5)
nRBC: 0 % (ref 0.0–0.2)

## 2019-05-13 LAB — COMPREHENSIVE METABOLIC PANEL WITH GFR
ALT: 133 U/L — ABNORMAL HIGH (ref 0–44)
AST: 112 U/L — ABNORMAL HIGH (ref 15–41)
Albumin: 2.2 g/dL — ABNORMAL LOW (ref 3.5–5.0)
Alkaline Phosphatase: 467 U/L — ABNORMAL HIGH (ref 38–126)
Anion gap: 13 (ref 5–15)
BUN: 18 mg/dL (ref 8–23)
CO2: 22 mmol/L (ref 22–32)
Calcium: 8.9 mg/dL (ref 8.9–10.3)
Chloride: 96 mmol/L — ABNORMAL LOW (ref 98–111)
Creatinine, Ser: 1.39 mg/dL — ABNORMAL HIGH (ref 0.61–1.24)
GFR calc Af Amer: >60 mL/min
GFR calc non Af Amer: 53 mL/min — ABNORMAL LOW
Glucose, Bld: 110 mg/dL — ABNORMAL HIGH (ref 70–99)
Potassium: 2.8 mmol/L — ABNORMAL LOW (ref 3.5–5.1)
Sodium: 131 mmol/L — ABNORMAL LOW (ref 135–145)
Total Bilirubin: 20.1 mg/dL (ref 0.3–1.2)
Total Protein: 6.1 g/dL — ABNORMAL LOW (ref 6.5–8.1)

## 2019-05-13 LAB — POTASSIUM: Potassium: 3 mmol/L — ABNORMAL LOW (ref 3.5–5.1)

## 2019-05-13 MED ORDER — MAGNESIUM OXIDE 400 (241.3 MG) MG PO TABS
400.0000 mg | ORAL_TABLET | Freq: Every day | ORAL | Status: DC
  Administered 2019-05-13: 400 mg via ORAL
  Filled 2019-05-13: qty 1

## 2019-05-13 MED ORDER — POTASSIUM CHLORIDE CRYS ER 20 MEQ PO TBCR: 40.0000 meq | EXTENDED_RELEASE_TABLET | ORAL | Status: DC

## 2019-05-13 MED ORDER — MAGNESIUM OXIDE 400 (241.3 MG) MG PO TABS: 400.0000 mg | ORAL_TABLET | Freq: Every day | ORAL | Status: DC

## 2019-05-13 MED ORDER — POTASSIUM CHLORIDE CRYS ER 20 MEQ PO TBCR: 40.0000 meq | EXTENDED_RELEASE_TABLET | Freq: Once | ORAL | Status: DC

## 2019-05-13 MED ORDER — POTASSIUM CHLORIDE 10 MEQ/100ML IV SOLN
10.0000 meq | INTRAVENOUS | Status: AC
  Administered 2019-05-13 (×6): 10 meq via INTRAVENOUS
  Filled 2019-05-13 (×6): qty 100

## 2019-05-13 MED ORDER — POTASSIUM CHLORIDE CRYS ER 20 MEQ PO TBCR: 40.0000 meq | EXTENDED_RELEASE_TABLET | Freq: Every day | ORAL | 0 refills | Status: DC

## 2019-05-13 MED ORDER — POTASSIUM CHLORIDE CRYS ER 20 MEQ PO TBCR: 40.0000 meq | EXTENDED_RELEASE_TABLET | Freq: Two times a day (BID) | ORAL | Status: DC

## 2019-05-13 MED ORDER — IOHEXOL 350 MG/ML SOLN
100.0000 mL | Freq: Once | INTRAVENOUS | Status: AC | PRN
  Administered 2019-05-13: 100 mL via INTRAVENOUS

## 2019-05-13 MED ORDER — ATENOLOL 100 MG PO TABS: 100.0000 mg | ORAL_TABLET | Freq: Every day | ORAL | 0 refills | Status: DC

## 2019-05-13 MED ORDER — GUAIFENESIN 400 MG PO TABS: 400.0000 mg | ORAL_TABLET | Freq: Four times a day (QID) | ORAL | Status: DC | PRN

## 2019-05-13 NOTE — Progress Notes (Signed)
Progress Note   Subjective  Patient feeling better, LFTs much improved. He is NPO For possible EUS, has been given K supplementation per primary service.   Objective   Vital signs in last 24 hours: Temp:  [97.7 F (36.5 C)-98.3 F (36.8 C)] 97.7 F (36.5 C) (04/09 0923) Pulse Rate:  [50-57] 54 (04/09 0923) Resp:  [17-18] 18 (04/09 0923) BP: (120-138)/(68-73) 120/73 (04/09 0923) SpO2:  [100 %] 100 % (04/09 0923) Last BM Date: 05/12/19 General:    AA male in NAD Abdomen:  Soft, nontender and nondistended.  Neurologic:  Alert and oriented,  grossly normal neurologically. Psych:  Cooperative. Normal mood and affect.  Intake/Output from previous day: 04/08 0701 - 04/09 0700 In: 1193 [P.O.:980; I.V.:163.5; IV Piggyback:49.5] Out: 2760 [Urine:2760] Intake/Output this shift: Total I/O In: 0  Out: 275 [Urine:275]  Lab Results: Recent Labs    05/11/19 0516 05/12/19 0345 05/13/19 0446  WBC 8.2 8.4 11.3*  HGB 10.9* 11.3* 9.6*  HCT 31.9* 32.4* 28.1*  PLT 378 420* 411*   BMET Recent Labs    05/11/19 0516 05/11/19 0516 05/11/19 1134 05/12/19 0345 05/13/19 0446  NA 130*  --   --  128* 131*  K 2.4*   < > 2.9* 3.1* 2.8*  CL 95*  --   --  95* 96*  CO2 20*  --   --  20* 22  GLUCOSE 97  --   --  153* 110*  BUN 11  --   --  17 18  CREATININE 1.15  --   --  1.37* 1.39*  CALCIUM 9.4  --   --  9.3 8.9   < > = values in this interval not displayed.   LFT Recent Labs    05/13/19 0446  PROT 6.1*  ALBUMIN 2.2*  AST 112*  ALT 133*  ALKPHOS 467*  BILITOT 20.1*   PT/INR Recent Labs    05/10/19 1625 05/11/19 0516  LABPROT 25.9* 16.4*  INR 2.4* 1.3*    Studies/Results: DG ERCP BILIARY & PANCREATIC DUCTS  Result Date: 05/11/2019 CLINICAL DATA:  64 year old male with biliary obstruction EXAM: ERCP TECHNIQUE: Multiple spot images obtained with the fluoroscopic device and submitted for interpretation post-procedure. FLUOROSCOPY TIME:  Fluoroscopy Time:  6 minutes  57 seconds COMPARISON:  MR 05/10/2019 FINDINGS: Limited intraoperative fluoroscopic spot images performed. Initial image demonstrates endoscope projecting over the upper abdomen. There is subsequently cannulation of the ampulla with retrograde infusion of contrast. Ossification of the extrahepatic biliary ducts with dilation. Final image demonstrates placement of a plastic biliary stent. IMPRESSION: Limited images during ERCP demonstrates treatment of extrahepatic biliary system with deployment of plastic biliary stent. Please refer to the dictated operative report for full details of intraoperative findings and procedure. Electronically Signed   By: Corrie Mckusick D.O.   On: 05/11/2019 17:07       Assessment / Plan:    64 y/o male with malignancy biliary obstruction from likely pancreatic cancer. Underwent ERCP 4/7 for stent placement, bilirubin continues to trend down, his urine is lightening, this will take some time to normalize.   He will need an EUS with biopsy to confirm diagnosis and help with staging. He also warrants staging CTs prior to discharge. We have a possible window to do the EUS with Dr. Rush Landmark this AM pending other inpatient cases. I have discussed EUS with him and he wishes to proceed if we can today. K has been supplemented this AM per primary service.  If timing does not allow for EUS today, we will schedule it for next Thursday as outpatient with Dr. Ardis Hughs. We will get staging CTs done prior to discharge however would await decision on EUS today prior to ordering this.   Patient in agreement with plan, he can likely go home in the next 24 hours once his inpatient workup is done, with close outpatient follow up.   [place] Cellar, MD [facility]

## 2019-05-13 NOTE — Discharge Summary (Signed)
Physician Discharge Summary  Isaac Doyle [id number] DOB: [birth year] DOA: 05/09/2019  PCP: System, Pcp Not In  Admit date: 05/09/2019 Discharge date: 05/14/2019  Admitted From: Home Discharge disposition: Home   Recommendations for Outpatient Follow-Up:   C MP 1 week Patient to undergo EUS with biopsy next week   Discharge Diagnosis:   Principal Problem:   Jaundice Active Problems:   ARF (acute renal failure) (HCC)   Essential hypertension   Hypokalemia   Normochromic normocytic anemia   Malignant biliary obstruction (HCC)   Biliary stricture    Discharge Condition: Improved.  Diet recommendation: Low sodium, heart healthy.    Wound care: None.  Code status: Full.   History of Present Illness:  Isaac Doyle is a 64 y.o. male with history of hypertension presents to the ER the patient was noticed to have elevated liver enzymes.  Patient states over the last 2 weeks patient has been getting more jaundiced with dark urine and poor appetite.  Had gone to [place] had labs done and was found to have abnormal LFTs and was referred to the ER.  Denies any abdominal pain nausea vomiting diarrhea fever or chills.  Denies chest pain or shortness of breath.     Hospital Course by Problem:   Obstructive jaundice: Likely mass in the pancreatic head -GI consult cruciated -MRCP:Suspect infiltrating pancreatic head neoplasm obstructing the common bile duct as it enters the pancreatic head. Difficult to identify for certain without contrast or assess the major vascular structures. Patient may require a pancreatic protocol CT scan. Recommend GI consultation for biliary drainage and probable EUS and biopsy. -ERCP  4/7: 1. Malignant appearing distal bile duct stricturewith upstream dilation. Status post ERCP with sphincterotomy and biliary stent placement.  Will need endoscopic ultrasound with biopsiesand staging. CT scan of chest and pancreas with pancreatic  protocol: Diffuse infiltrative process involving the head, neck, body and tail of pancreas is identified. The diffusely infiltrative appearance of the pancreas is somewhat unusual. Although favored to represent pancreatic adenocarcinoma, other etiologies to consider include IgG4-related Sclerosing Disease of the pancreas. No convincing evidence for liver metastasis or metastatic disease to the chest.  Elevated LFTs likely from obstruction -trending down  Severe hypokalemia -Repleted along with magnesium -We will need close outpatient monitoring  Hypertension -Decrease atenolol due to bradycardia  Acute kidney injury -Follow-up outpatient  Normocytic normochromic anemia  -Follow outpatient     Medical Consultants:   GI   Discharge Exam:   Vitals:   05/13/19 0919 05/13/19 0923  BP:  120/73  Pulse: (!) 57 (!) 54  Resp:  18  Temp:  97.7 F (36.5 C)  SpO2:  100%   Vitals:   05/12/19 2029 05/13/19 0446 05/13/19 0919 05/13/19 0923  BP: 124/68 138/72  120/73  Pulse: (!) 55 (!) 50 (!) 57 (!) 54  Resp: 18 17  18   Temp: 98.3 F (36.8 C) 97.7 F (36.5 C)  97.7 F (36.5 C)  TempSrc: Oral Oral    SpO2: 100% 100%  100%  Weight:      Height:        General exam: Appears calm and comfortable.  The results of significant diagnostics from this hospitalization (including imaging, microbiology, ancillary and laboratory) are listed below for reference.     Procedures and Diagnostic Studies:   MR ABDOMEN MRCP WO CONTRAST  Result Date: 05/10/2019 CLINICAL DATA:  Biliary obstruction. Jaundice. EXAM: MRI ABDOMEN WITHOUT CONTRAST  (INCLUDING MRCP) TECHNIQUE: Multiplanar  multisequence MR imaging of the abdomen was performed. Heavily T2-weighted images of the biliary and pancreatic ducts were obtained, and three-dimensional MRCP images were rendered by post processing. COMPARISON:  Ultrasound 05/09/2019 FINDINGS: Lower chest: The lung bases are grossly clear. No pulmonary  lesions, pleural or pericardial effusion. Hepatobiliary: Marked intrahepatic biliary dilatation. No obvious hepatic lesions are identified without contrast. The gallbladder is distended. Small gallstones are noted. There is an abrupt cut off of the common bile duct chest as it enters the region of the pancreatic head. The duct is dilated to 16 mm. No common bile duct stones are identified. Pancreas: Suspect infiltrating pancreatic neoplasm in the pancreatic head obstructing the common bile duct. There is also mild dilatation of the main pancreatic duct and the minor pancreatic duct. Difficult to clearly identify or measure the lesion without contrast. Spleen:  Upper limits of normal in size. No splenic lesions. Adrenals/Urinary Tract: The adrenal glands are unremarkable. Bilateral renal cysts are noted. Stomach/Bowel: The stomach, duodenum, visualized small bowel and visualize colon are grossly normal. Vascular/Lymphatic:  The aorta is normal in caliber. No definite mesenteric or retroperitoneal adenopathy. Other:  No ascites or abdominal wall hernia. Musculoskeletal: No significant bony findings. IMPRESSION: 1. 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 Difficult to identify for certain without contrast or assess the major vascular structures. Patient may require a pancreatic protocol CT scan. Recommend GI consultation for biliary drainage and probable EUS and biopsy. 2. No obvious adenopathy or metastatic disease. 3. Marked intrahepatic and extrahepatic biliary dilatation. 4. Cholelithiasis. 5. Bilateral renal cysts. Electronically Signed   By: Marijo Sanes M.D.   On: 05/10/2019 07:29   US Abdomen Limited  Result Date: 05/09/2019 CLINICAL DATA:  64 year old male with jaundice and elevated LFTs. EXAM: ULTRASOUND ABDOMEN LIMITED RIGHT UPPER QUADRANT COMPARISON:  None. FINDINGS: Gallbladder: The gallbladder is distended. There is sludge and  stones within the gallbladder. No gallbladder wall thickening or pericholecystic fluid. Common bile duct: Diameter: 15 mm. There is dilatation of the common bile duct as well as moderate intrahepatic biliary ductal dilatation. A centrally obstructing stone or mass is not excluded. Further initial evaluation with CT of the abdomen pelvis with IV contrast is recommended. MRCP may provide additional evaluation based on CT findings. Liver: There is mild diffuse increased liver echogenicity most commonly seen in the setting of fatty infiltration. Superimposed inflammation or fibrosis is not excluded. Clinical correlation is recommended. Portal vein is patent on color Doppler imaging with normal direction of blood flow towards the liver. Other: There is a 2.0 x 1.9 x 1.8 cm hypoechoic lesion in the region of the porta pedis and the head of the pancreas. IMPRESSION: Cholelithiasis with findings concerning for central biliary obstruction. Further evaluation with CT with IV contrast is recommended. No definite sonographic findings of acute cholecystitis. Electronically Signed   By: Anner Crete M.D.   On: 05/09/2019 21:15     Labs:   Basic Metabolic Panel: Recent Labs  Lab 05/10/19 0621 05/10/19 0621 05/10/19 1441 05/10/19 1441 05/11/19 0516 05/11/19 1134 05/12/19 0345 05/12/19 0345 05/13/19 0446 05/13/19 1702  NA 130*  --  124*  --  130*  --  128*  --  131*  --   K 2.2*   < > 3.8   < > 2.4*   < > 3.1*   < > 2.8* 3.0*  CL 92*  --  93*  --  95*  --  95*  --  96*  --   CO2 24  --  20*  --  20*  --  20*  --  22  --   GLUCOSE 119*  --  248*  --  97  --  153*  --  110*  --   BUN 15  --  11  --  11  --  17  --  18  --   CREATININE 1.27*  --  1.04  --  1.15  --  1.37*  --  1.39*  --   CALCIUM 9.5  --  8.5*  --  9.4  --  9.3  --  8.9  --   MG 1.8  --   --   --  1.6*  --   --   --   --   --   PHOS  --   --   --   --  4.9*  --   --   --   --   --    < > = values in this interval not displayed.    GFR Estimated Creatinine Clearance: 56.4 mL/min (A) (by C-G formula based on SCr of 1.39 mg/dL (H)). Liver Function Tests: Recent Labs  Lab 05/09/19 1924 05/11/19 0516 05/12/19 0345 05/13/19 0446  AST 183* 151* 118* 112*  ALT 176* 156* 139* 133*  ALKPHOS 668* 538* 490* 467*  BILITOT 48.4* 42.3* 39.7* 20.1*  PROT 7.8 6.3* 6.4* 6.1*  ALBUMIN 2.7* 2.2* 2.2* 2.2*   Recent Labs  Lab 05/09/19 1943  LIPASE 22   No results for input(s): AMMONIA in the last 168 hours. Coagulation profile Recent Labs  Lab 05/10/19 1625 05/11/19 0516  INR 2.4* 1.3*    CBC: Recent Labs  Lab 05/09/19 1451 05/10/19 0621 05/11/19 0516 05/12/19 0345 05/13/19 0446  WBC 10.9* 8.6 8.2 8.4 11.3*  NEUTROABS  --   --  5.9 7.4 8.1*  HGB 11.5* 10.6* 10.9* 11.3* 9.6*  HCT 33.4* 30.7* 31.9* 32.4* 28.1*  MCV 87.0 87.2 88.6 88.0 90.4  PLT 442* 392 378 420* 411*   Cardiac Enzymes: No results for input(s): CKTOTAL, CKMB, CKMBINDEX, TROPONINI in the last 168 hours. BNP: Invalid input(s): POCBNP CBG: No results for input(s): GLUCAP in the last 168 hours. D-Dimer No results for input(s): DDIMER in the last 72 hours. Hgb A1c No results for input(s): HGBA1C in the last 72 hours. Lipid Profile No results for input(s): CHOL, HDL, LDLCALC, TRIG, CHOLHDL, LDLDIRECT in the last 72 hours. Thyroid function studies No results for input(s): TSH, T4TOTAL, T3FREE, THYROIDAB in the last 72 hours.  Invalid input(s): FREET3 Anemia work up No results for input(s): VITAMINB12, FOLATE, FERRITIN, TIBC, IRON, RETICCTPCT in the last 72 hours. Microbiology Recent Results (from the past 240 hour(s))  SARS CORONAVIRUS 2 (TAT 6-24 HRS) Nasopharyngeal Nasopharyngeal Swab     Status: None   Collection Time: 05/09/19 10:06 PM   Specimen: Nasopharyngeal Swab  Result Value Ref Range Status   SARS Coronavirus 2 NEGATIVE NEGATIVE Final    Comment: (NOTE) SARS-CoV-2 target nucleic acids are NOT DETECTED. The SARS-CoV-2 RNA  is generally detectable in upper and lower respiratory specimens during the acute phase of infection. Negative results do not preclude SARS-CoV-2 infection, do not rule out co-infections with other pathogens, and should not be used as the sole basis for treatment or other patient management decisions. Negative results must be combined with clinical observations, patient history, and epidemiological information. The expected result is Negative. Fact Sheet for Patients: SugarRoll.be  Fact Sheet for Healthcare Providers: https://www.woods-mathews.com/ This test is not yet approved or cleared by the [place] FDA and  has been authorized for detection and/or diagnosis of SARS-CoV-2 by FDA under an Emergency Use Authorization (EUA). This EUA will remain  in effect (meaning this test can be used) for the duration of the COVID-19 declaration under Section 56 4(b)(1) of the Act, 21 U.S.C. section 360bbb-3(b)(1), unless the authorization is terminated or revoked sooner. Performed at [facility], [place] [address], [place], [place] [address]      Discharge Instructions:   Discharge Instructions    Diet - low sodium heart healthy   Complete by: As directed    Discharge instructions   Complete by: As directed    Bmp 1 week  follow up with GI for EBUS (biopsy)   Increase activity slowly   Complete by: As directed      Allergies as of 05/13/2019   No Known Allergies     Medication List    STOP taking these medications   atenolol-chlorthalidone 100-25 MG tablet Commonly known as: TENORETIC   sulindac 200 MG tablet Commonly known as: CLINORIL     TAKE these medications   amLODipine 10 MG tablet Commonly known as: NORVASC Take 10 mg by mouth daily.   atenolol 100 MG tablet Commonly known as: TENORMIN Take 1 tablet (100 mg total) by mouth daily.   cetirizine 10 MG tablet Commonly known as: ZYRTEC Take 10 mg by mouth daily.    guaifenesin 400 MG Tabs tablet Commonly known as: HUMIBID E Take 1 tablet (400 mg total) by mouth every 6 (six) hours as needed. What changed:   when to take this  reasons to take this   magnesium oxide 400 (241.3 Mg) MG tablet Commonly known as: MAG-OX Take 1 tablet (400 mg total) by mouth daily.   potassium chloride SA 20 MEQ tablet Commonly known as: KLOR-CON Take 2 tablets (40 mEq total) by mouth daily.      Follow-up Information    [facility] Follow up.        [name], MD Follow up.   Specialty: Gastroenterology Why: tenatively scheduled for 4/15- office should be in contact Contact information: [address] [place] [address] [phone]            Time coordinating discharge: 35 min  Signed:  Geradine Girt DO  [facility] 05/14/2019, 5:45 PM

## 2019-05-13 NOTE — H&P (View-Only) (Signed)
Progress Note   Subjective  Patient feeling better, LFTs much improved. He is NPO For possible EUS, has been given K supplementation per primary service.   Objective   Vital signs in last 24 hours: Temp:  [97.7 F (36.5 C)-98.3 F (36.8 C)] 97.7 F (36.5 C) (04/09 0923) Pulse Rate:  [50-57] 54 (04/09 0923) Resp:  [17-18] 18 (04/09 0923) BP: (120-138)/(68-73) 120/73 (04/09 0923) SpO2:  [100 %] 100 % (04/09 0923) Last BM Date: 05/12/19 General:    AA male in NAD Abdomen:  Soft, nontender and nondistended.  Neurologic:  Alert and oriented,  grossly normal neurologically. Psych:  Cooperative. Normal mood and affect.  Intake/Output from previous day: 04/08 0701 - 04/09 0700 In: 1193 [P.O.:980; I.V.:163.5; IV Piggyback:49.5] Out: 2760 [Urine:2760] Intake/Output this shift: Total I/O In: 0  Out: 275 [Urine:275]  Lab Results: Recent Labs    05/11/19 0516 05/12/19 0345 05/13/19 0446  WBC 8.2 8.4 11.3*  HGB 10.9* 11.3* 9.6*  HCT 31.9* 32.4* 28.1*  PLT 378 420* 411*   BMET Recent Labs    05/11/19 0516 05/11/19 0516 05/11/19 1134 05/12/19 0345 05/13/19 0446  NA 130*  --   --  128* 131*  K 2.4*   < > 2.9* 3.1* 2.8*  CL 95*  --   --  95* 96*  CO2 20*  --   --  20* 22  GLUCOSE 97  --   --  153* 110*  BUN 11  --   --  17 18  CREATININE 1.15  --   --  1.37* 1.39*  CALCIUM 9.4  --   --  9.3 8.9   < > = values in this interval not displayed.   LFT Recent Labs    05/13/19 0446  PROT 6.1*  ALBUMIN 2.2*  AST 112*  ALT 133*  ALKPHOS 467*  BILITOT 20.1*   PT/INR Recent Labs    05/10/19 1625 05/11/19 0516  LABPROT 25.9* 16.4*  INR 2.4* 1.3*    Studies/Results: DG ERCP BILIARY & PANCREATIC DUCTS  Result Date: 05/11/2019 CLINICAL DATA:  64 year old male with biliary obstruction EXAM: ERCP TECHNIQUE: Multiple spot images obtained with the fluoroscopic device and submitted for interpretation post-procedure. FLUOROSCOPY TIME:  Fluoroscopy Time:  6 minutes  57 seconds COMPARISON:  MR 05/10/2019 FINDINGS: Limited intraoperative fluoroscopic spot images performed. Initial image demonstrates endoscope projecting over the upper abdomen. There is subsequently cannulation of the ampulla with retrograde infusion of contrast. Ossification of the extrahepatic biliary ducts with dilation. Final image demonstrates placement of a plastic biliary stent. IMPRESSION: Limited images during ERCP demonstrates treatment of extrahepatic biliary system with deployment of plastic biliary stent. Please refer to the dictated operative report for full details of intraoperative findings and procedure. Electronically Signed   By: Corrie Mckusick D.O.   On: 05/11/2019 17:07       Assessment / Plan:    64 y/o male with malignancy biliary obstruction from likely pancreatic cancer. Underwent ERCP 4/7 for stent placement, bilirubin continues to trend down, his urine is lightening, this will take some time to normalize.   He will need an EUS with biopsy to confirm diagnosis and help with staging. He also warrants staging CTs prior to discharge. We have a possible window to do the EUS with Dr. Rush Landmark this AM pending other inpatient cases. I have discussed EUS with him and he wishes to proceed if we can today. K has been supplemented this AM per primary service.  If timing does not allow for EUS today, we will schedule it for next Thursday as outpatient with Dr. Ardis Hughs. We will get staging CTs done prior to discharge however would await decision on EUS today prior to ordering this.   Patient in agreement with plan, he can likely go home in the next 24 hours once his inpatient workup is done, with close outpatient follow up.   Gloucester Cellar, MD [facility]

## 2019-05-13 NOTE — Progress Notes (Signed)
DISCHARGE NOTE HOME Newman Navratil to be discharged Home per MD order. Discussed prescriptions and follow up appointments with the patient. Prescriptions given to patient; medication list explained in detail. Patient verbalized understanding.  Skin clean, dry and intact without evidence of skin break down, no evidence of skin tears noted. IV catheter discontinued intact. Site without signs and symptoms of complications. Dressing and pressure applied. Pt denies pain at the site currently. No complaints noted.  Patient free of lines, drains, and wounds.   An After Visit Summary (AVS) was printed and given to the patient. Patient escorted via wheelchair, and discharged home via private auto.  Orville Govern, RN3

## 2019-05-13 NOTE — Telephone Encounter (Signed)
-----   Message from Irving Copas., MD sent at 05/13/2019  1:57 PM EDT ----- Regarding: RE: new pancreatic cancer - needs EUS Steve,Sorry we were not able to accommodate the patient in the hospital today due to not having availability from an anesthesia perspective. Tommy Goostree please move forward with scheduling the patient with DJ next week if that is still available if not he can be added to my schedule or his first available.Please let us know when he is scheduled for.Thanks.GM ----- Message ----- From: Yetta Flock, MD Sent: 05/13/2019   7:21 AM EDT To: [name], MD, Vena Rua, PA-C, # Subject: RE: new pancreatic cancer - needs EUS          Thanks Linna Hoff,  Yes we can get the staging imaging while here. We had thought there may be a spot for him this AM with Gabe for EUS but that is seeming less likely given other inpatient cases, will let you know, likely may need your Thursday spot, will get back to you. Thanks ----- Message ----- From: [name], MD Sent: 05/13/2019   5:23 AM EDT To: Vena Rua, PA-C, Timothy Lasso, RN, # Subject: RE: new pancreatic cancer - needs EUS          I can add him to next Thursday (4/15).  Richardson Landry, his MR was without IV contrast. Can you guys arrange a pancreatic protocol CT scan and may as well get a staging chest CT at the same time given that this is likely a cancer?  Thanks  DJ  ----- Message ----- From: Irving Copas., MD Sent: 05/12/2019   7:08 PM EDT To: [name], MD, Timothy Lasso, RN, # Subject: RE: new pancreatic cancer - needs EUS          Steve,Let us see if we can get this done on Friday otherwise Uzma Hellmer will look for an EUS slot with DJ or myself in the next 3 weeks.Thanks.GM ----- Message ----- From: Yetta Flock, MD Sent: 05/12/2019  12:31 PM EDT To: [name], MD, Timothy Lasso, RN, # Subject: new pancreatic cancer - needs EUS              Hey guys, This patient is admitted  with malignancy biliary obstruction due to pancreatic mass. John did ERCP yesterday and placed a stent. Needs EUS. Going to complete his imaging while here today, hopefully home tomorrow. Any openings for an EUS / biopsy in the near future to accommodate this patient?   Thanks Richardson Landry

## 2019-05-16 ENCOUNTER — Ambulatory Visit (HOSPITAL_COMMUNITY): Admit: 2019-05-16 | Payer: No Typology Code available for payment source | Admitting: Gastroenterology

## 2019-05-16 ENCOUNTER — Other Ambulatory Visit (HOSPITAL_COMMUNITY)
Admission: RE | Admit: 2019-05-16 | Discharge: 2019-05-16 | Disposition: A | Discharge status: Home / Self Care | Payer: No Typology Code available for payment source | Source: Ambulatory Visit | Attending: Gastroenterology | Admitting: Gastroenterology

## 2019-05-16 ENCOUNTER — Encounter (HOSPITAL_COMMUNITY): Payer: Self-pay

## 2019-05-16 ENCOUNTER — Telehealth: Payer: Self-pay

## 2019-05-16 ENCOUNTER — Other Ambulatory Visit: Payer: Self-pay

## 2019-05-16 DIAGNOSIS — Z01812 Encounter for preprocedural laboratory examination: Secondary | ICD-10-CM | POA: Insufficient documentation

## 2019-05-16 DIAGNOSIS — Z20822 Contact with and (suspected) exposure to covid-19: Secondary | ICD-10-CM | POA: Insufficient documentation

## 2019-05-16 DIAGNOSIS — C259 Malignant neoplasm of pancreas, unspecified: Secondary | ICD-10-CM

## 2019-05-16 LAB — SARS CORONAVIRUS 2 (TAT 6-24 HRS): SARS Coronavirus 2: NEGATIVE

## 2019-05-16 SURGERY — UPPER ENDOSCOPIC ULTRASOUND (EUS) RADIAL
Anesthesia: Monitor Anesthesia Care | Laterality: Left

## 2019-05-16 NOTE — Telephone Encounter (Signed)
-----   Message from [name], MD sent at 05/13/2019  2:47 PM EDT ----- Regarding: RE: new pancreatic cancer - needs EUS Gevork Ayyad, CAn you put him in for next Thursday (my previous 8:30 case was cancelled).  Thanks   DJ ----- Message ----- From: Irving Copas., MD Sent: 05/13/2019   1:57 PM EDT To: [name], MD, Vena Rua, PA-C, # Subject: RE: new pancreatic cancer - needs EUS          Steve,Sorry we were not able to accommodate the patient in the hospital today due to not having availability from an anesthesia perspective. Jerian Morais please move forward with scheduling the patient with DJ next week if that is still available if not he can be added to my schedule or his first available.Please let us know when he is scheduled for.Thanks.GM ----- Message ----- From: Yetta Flock, MD Sent: 05/13/2019   7:21 AM EDT To: [name], MD, Vena Rua, PA-C, # Subject: RE: new pancreatic cancer - needs EUS          Thanks Linna Hoff,  Yes we can get the staging imaging while here. We had thought there may be a spot for him this AM with Gabe for EUS but that is seeming less likely given other inpatient cases, will let you know, likely may need your Thursday spot, will get back to you. Thanks ----- Message ----- From: [name], MD Sent: 05/13/2019   5:23 AM EDT To: Vena Rua, PA-C, Timothy Lasso, RN, # Subject: RE: new pancreatic cancer - needs EUS          I can add him to next Thursday (4/15).  Richardson Landry, his MR was without IV contrast. Can you guys arrange a pancreatic protocol CT scan and may as well get a staging chest CT at the same time given that this is likely a cancer?  Thanks  DJ  ----- Message ----- From: Irving Copas., MD Sent: 05/12/2019   7:08 PM EDT To: [name], MD, Timothy Lasso, RN, # Subject: RE: new pancreatic cancer - needs EUS          Steve,Let us see if we can get this done on Friday otherwise Dare Spillman will look  for an EUS slot with DJ or myself in the next 3 weeks.Thanks.GM ----- Message ----- From: Yetta Flock, MD Sent: 05/12/2019  12:31 PM EDT To: [name], MD, Timothy Lasso, RN, # Subject: new pancreatic cancer - needs EUS              Hey guys, This patient is admitted with malignancy biliary obstruction due to pancreatic mass. John did ERCP yesterday and placed a stent. Needs EUS. Going to complete his imaging while here today, hopefully home tomorrow. Any openings for an EUS / biopsy in the near future to accommodate this patient?   Thanks Richardson Landry

## 2019-05-16 NOTE — Telephone Encounter (Signed)
I have spoken with the pt daughter and she has been advised of the appt for COVID today and procedure on 4/15.  All instructions verbally understood.

## 2019-05-16 NOTE — Telephone Encounter (Signed)
Adair Laundry, RN; [name], MD  HI  FYI pt requests that any information re the EUS or appointments etc get sent to his daughter  Milagros Evener cell is [phone]. He and his dtr are extremely nice, cancer seems to happen to the nicest people.    S

## 2019-05-16 NOTE — Telephone Encounter (Signed)
COVID test today 4/12 by 3 pm.  EUS/EGD 05/19/19 at [facility] 830 am to arrive at 7 am and nothing to eat or drink after midnight with Dr Ardis Hughs.  Left message on machine to call back and also left message on daughters line to return call.

## 2019-05-19 ENCOUNTER — Encounter (HOSPITAL_COMMUNITY): Payer: Self-pay | Admitting: Gastroenterology

## 2019-05-19 ENCOUNTER — Ambulatory Visit (HOSPITAL_COMMUNITY)
Admission: RE | Admit: 2019-05-19 | Discharge: 2019-05-19 | Disposition: A | Discharge status: Home / Self Care | Payer: No Typology Code available for payment source | Attending: Gastroenterology | Admitting: Gastroenterology

## 2019-05-19 ENCOUNTER — Encounter (HOSPITAL_COMMUNITY)
Admission: RE | Disposition: A | Discharge status: Home / Self Care | Payer: Self-pay | Source: Home / Self Care | Attending: Gastroenterology

## 2019-05-19 ENCOUNTER — Other Ambulatory Visit: Payer: Self-pay

## 2019-05-19 ENCOUNTER — Ambulatory Visit (HOSPITAL_COMMUNITY): Payer: No Typology Code available for payment source | Admitting: Anesthesiology

## 2019-05-19 DIAGNOSIS — R17 Unspecified jaundice: Secondary | ICD-10-CM | POA: Diagnosis not present

## 2019-05-19 DIAGNOSIS — K831 Obstruction of bile duct: Secondary | ICD-10-CM | POA: Diagnosis not present

## 2019-05-19 DIAGNOSIS — C259 Malignant neoplasm of pancreas, unspecified: Secondary | ICD-10-CM

## 2019-05-19 DIAGNOSIS — C258 Malignant neoplasm of overlapping sites of pancreas: Secondary | ICD-10-CM | POA: Diagnosis not present

## 2019-05-19 DIAGNOSIS — R933 Abnormal findings on diagnostic imaging of other parts of digestive tract: Secondary | ICD-10-CM

## 2019-05-19 DIAGNOSIS — I10 Essential (primary) hypertension: Secondary | ICD-10-CM | POA: Diagnosis not present

## 2019-05-19 DIAGNOSIS — K8689 Other specified diseases of pancreas: Secondary | ICD-10-CM

## 2019-05-19 DIAGNOSIS — C787 Secondary malignant neoplasm of liver and intrahepatic bile duct: Secondary | ICD-10-CM

## 2019-05-19 HISTORY — PX: FINE NEEDLE ASPIRATION: SHX5430

## 2019-05-19 HISTORY — PX: ESOPHAGOGASTRODUODENOSCOPY (EGD) WITH PROPOFOL: SHX5813

## 2019-05-19 HISTORY — PX: EUS: SHX5427

## 2019-05-19 LAB — COMPREHENSIVE METABOLIC PANEL
ALT: 111 U/L — ABNORMAL HIGH (ref 0–44)
AST: 95 U/L — ABNORMAL HIGH (ref 15–41)
Albumin: 3.2 g/dL — ABNORMAL LOW (ref 3.5–5.0)
Alkaline Phosphatase: 288 U/L — ABNORMAL HIGH (ref 38–126)
Anion gap: 13 (ref 5–15)
BUN: 18 mg/dL (ref 8–23)
CO2: 24 mmol/L (ref 22–32)
Calcium: 9.3 mg/dL (ref 8.9–10.3)
Chloride: 96 mmol/L — ABNORMAL LOW (ref 98–111)
Creatinine, Ser: 1.35 mg/dL — ABNORMAL HIGH (ref 0.61–1.24)
GFR calc Af Amer: >60 mL/min (ref >60)
GFR calc non Af Amer: 55 mL/min — ABNORMAL LOW (ref >60)
Glucose, Bld: 105 mg/dL — ABNORMAL HIGH (ref 70–99)
Potassium: 3.2 mmol/L — ABNORMAL LOW (ref 3.5–5.1)
Sodium: 133 mmol/L — ABNORMAL LOW (ref 135–145)
Total Bilirubin: 15.1 mg/dL — ABNORMAL HIGH (ref 0.3–1.2)
Total Protein: 7.6 g/dL (ref 6.5–8.1)

## 2019-05-19 SURGERY — ESOPHAGOGASTRODUODENOSCOPY (EGD) WITH PROPOFOL
Anesthesia: Monitor Anesthesia Care

## 2019-05-19 MED ORDER — SODIUM CHLORIDE 0.9 % IV SOLN: INTRAVENOUS | Status: DC

## 2019-05-19 MED ORDER — EPHEDRINE SULFATE-NACL 50-0.9 MG/10ML-% IV SOSY
PREFILLED_SYRINGE | INTRAVENOUS | Status: DC | PRN
  Administered 2019-05-19 (×2): 10 mg via INTRAVENOUS
  Administered 2019-05-19: 5 mg via INTRAVENOUS

## 2019-05-19 MED ORDER — PROPOFOL 10 MG/ML IV BOLUS
INTRAVENOUS | Status: DC | PRN
  Administered 2019-05-19: 20 mg via INTRAVENOUS
  Administered 2019-05-19: 30 mg via INTRAVENOUS
  Administered 2019-05-19: 20 mg via INTRAVENOUS

## 2019-05-19 MED ORDER — LIDOCAINE 2% (20 MG/ML) 5 ML SYRINGE
INTRAMUSCULAR | Status: DC | PRN
  Administered 2019-05-19 (×2): 50 mg via INTRAVENOUS

## 2019-05-19 MED ORDER — LACTATED RINGERS IV SOLN
INTRAVENOUS | Status: AC | PRN
  Administered 2019-05-19: 1000 mL via INTRAVENOUS

## 2019-05-19 MED ORDER — PROPOFOL 500 MG/50ML IV EMUL
INTRAVENOUS | Status: DC | PRN
  Administered 2019-05-19: 125 ug/kg/min via INTRAVENOUS

## 2019-05-19 MED ORDER — PHENYLEPHRINE 40 MCG/ML (10ML) SYRINGE FOR IV PUSH (FOR BLOOD PRESSURE SUPPORT)
PREFILLED_SYRINGE | INTRAVENOUS | Status: DC | PRN
  Administered 2019-05-19 (×2): 80 ug via INTRAVENOUS

## 2019-05-19 MED ORDER — PROPOFOL 500 MG/50ML IV EMUL
INTRAVENOUS | Status: AC
  Filled 2019-05-19: qty 50

## 2019-05-19 SURGICAL SUPPLY — 15 items

## 2019-05-19 NOTE — Transfer of Care (Signed)
Immediate Anesthesia Transfer of Care Note  Patient: Isaac Doyle  Procedure(s) Performed: ESOPHAGOGASTRODUODENOSCOPY (EGD) WITH PROPOFOL (N/A ) UPPER ENDOSCOPIC ULTRASOUND (EUS) LINEAR (N/A ) FINE NEEDLE ASPIRATION (FNA) LINEAR (N/A )  Patient Location: PACU and Endoscopy Unit  Anesthesia Type:MAC  Level of Consciousness: awake, drowsy and responds to stimulation  Airway & Oxygen Therapy: Patient Spontanous Breathing and Patient connected to nasal cannula oxygen  Post-op Assessment: Report given to RN and Post -op Vital signs reviewed and stable  Post vital signs: Reviewed and stable  Last Vitals:  Vitals Value Taken Time  BP 83/34 05/19/19 0947  Temp    Pulse 49 05/19/19 0948  Resp 17 05/19/19 0948  SpO2 100 % 05/19/19 0948  Vitals shown include unvalidated device data.  Last Pain:  Vitals:   05/19/19 0753  TempSrc: Oral  PainSc: 0-No pain         Complications: No apparent anesthesia complications

## 2019-05-19 NOTE — Anesthesia Procedure Notes (Signed)
Procedure Name: MAC Date/Time: 05/19/2019 9:07 AM Performed by: Lollie Sails, CRNA Pre-anesthesia Checklist: Patient identified, Emergency Drugs available, Suction available, Patient being monitored and Timeout performed Oxygen Delivery Method: Nasal cannula

## 2019-05-19 NOTE — Op Note (Signed)
[facility] Patient Name: Isaac Doyle Procedure Date: 05/19/2019 MRN: [id number] Attending MD: [name] , MD Date of Birth: 1955-08-30 CSN: [id number] Age: 64 Admit Type: Outpatient Procedure:                Upper EUS Indications:              Painless jaundice, abnormal pancreas, dilated bile                            ducts. ERCP with Dr. Henrene Pastor last week stented a 2cm                            long distal CBD stricture, unable to sample due to                            tenuous scope position, Tbili this morning was 15                            (down from 42 prior to biliary stenting) Providers:                [name], MD, Jobe Igo, RN, Theodora Blow, Technician Referring MD:             Jolly Mango, MD Medicines:                Monitored Anesthesia Care Complications:            No immediate complications. Estimated blood loss:                            None. Estimated Blood Loss:     Estimated blood loss: none. Procedure:                Pre-Anesthesia Assessment:                           - Prior to the procedure, a History and Physical                            was performed, and patient medications and                            allergies were reviewed. The patient's tolerance of                            previous anesthesia was also reviewed. The risks                            and benefits of the procedure and the sedation                            options and risks were discussed with the patient.  All questions were answered, and informed consent                            was obtained. Prior Anticoagulants: The patient has                            taken no previous anticoagulant or antiplatelet                            agents. ASA Grade Assessment: II - A patient with                            mild systemic disease. After reviewing the risks        and benefits, the patient was deemed in                            satisfactory condition to undergo the procedure.                           After obtaining informed consent, the endoscope was                            passed under direct vision. Throughout the                            procedure, the patient's blood pressure, pulse, and                            oxygen saturations were monitored continuously. The                            [id number] Olympus Linear EUS was                            introduced through the mouth, and advanced to the                            duodenum bulb. The upper EUS was accomplished                            without difficulty. The patient tolerated the                            procedure well. Scope In: Scope Out: Findings:      ENDOSCOPIC FINDING: :      The examined esophagus was endoscopically normal.      The entire examined stomach was endoscopically normal.      The examined duodenal bulb was endoscopically normal.      ENDOSONOGRAPHIC FINDING: :      1. A large mass was identified involving much of the pancreatic       parenchyma (including head, neck and body). The mass is at least 3cm on       this examination but that is likely an underestimate. The mass is  heterogeneous, hypoechoic with indistinct outer borders and clearly       involves the portal vein and likely the SMA as well. Three passes were       made with the 25 gauge needle using a transduodenal approach. A       cytotechnologist was present to evaluate the adequacy of the specimen.       Final cytology results are pending.      2. No peripancreatic adenopathy.      3. Previously placed plastic biliary stent was in good position in the       CBD.      4. Non-dilated main pancreatic duct.      5. Limited views of the liver, spleen were normal. Impression:               - Large mass involving much of the pancreatic                             parenchyma, clear encasing the PV and possibly                            involving the SMA as well. The mass was sampled                            with 3 transduodenal EUS FNB passes and the                            preliminary cytology was positive for malignancy                            (adenocarcinoma).                           - Previously placed plastic biliary stent was in                            the CBD in good position. Moderate Sedation:      Not Applicable - Patient had care per Anesthesia. Recommendation:           - Discharge patient to home.                           - Referral to medical oncology. Procedure Code(s):        --- Professional ---                           [phone], Esophagogastroduodenoscopy, flexible,                            transoral; with transendoscopic ultrasound-guided                            intramural or transmural fine needle                            aspiration/biopsy(s), (includes endoscopic  ultrasound examination limited to the esophagus,                            stomach or duodenum, and adjacent structures) Diagnosis Code(s):        --- Professional ---                           K86.89, Other specified diseases of pancreas                           R93.3, Abnormal findings on diagnostic imaging of                            other parts of digestive tract CPT copyright 2019 American Medical Association. All rights reserved. The codes documented in this report are preliminary and upon coder review may  be revised to meet current compliance requirements. [name], MD 05/19/2019 9:50:48 AM This report has been signed electronically. Number of Addenda: 0

## 2019-05-19 NOTE — Anesthesia Postprocedure Evaluation (Signed)
Anesthesia Post Note  Patient: Isaac Doyle  Procedure(s) Performed: ESOPHAGOGASTRODUODENOSCOPY (EGD) WITH PROPOFOL (N/A ) UPPER ENDOSCOPIC ULTRASOUND (EUS) LINEAR (N/A ) FINE NEEDLE ASPIRATION (FNA) LINEAR (N/A )     Patient location during evaluation: PACU Anesthesia Type: MAC Level of consciousness: awake and alert Pain management: pain level controlled Vital Signs Assessment: post-procedure vital signs reviewed and stable Respiratory status: spontaneous breathing Cardiovascular status: stable Anesthetic complications: no    Last Vitals:  Vitals:   05/19/19 0753 05/19/19 0949  BP: (!) 145/79 (!) 83/34  Pulse:  (!) 49  Resp: 17 17  Temp: 36.6 C (!) 36.1 C  SpO2: 99% 100%    Last Pain:  Vitals:   05/19/19 1000  TempSrc:   PainSc: 0-No pain                 Nolon Nations

## 2019-05-19 NOTE — Anesthesia Preprocedure Evaluation (Addendum)
Anesthesia Evaluation  Patient identified by MRN, date of birth, ID band Patient awake    Reviewed: Allergy & Precautions, NPO status , Patient's Chart, lab work & pertinent test results  Airway Mallampati: II  TM Distance: >3 FB Neck ROM: Full    Dental  (+) Dental Advisory Given, Poor Dentition, Missing, Chipped,    Pulmonary    Pulmonary exam normal  + decreased breath sounds      Cardiovascular METS: hypertension, Pt. on medications  Rhythm:Regular Rate:Normal     Neuro/Psych    GI/Hepatic   Endo/Other    Renal/GU Renal disease     Musculoskeletal   Abdominal   Peds  Hematology  (+) anemia ,   Anesthesia Other Findings   Reproductive/Obstetrics                            Anesthesia Physical  Anesthesia Plan  ASA: III  Anesthesia Plan: MAC   Post-op Pain Management:    Induction: Intravenous  PONV Risk Score and Plan: 1 and Propofol infusion, Treatment may vary due to age or medical condition and TIVA  Airway Management Planned: Natural Airway  Additional Equipment: None  Intra-op Plan:   Post-operative Plan:   Informed Consent: I have reviewed the patients History and Physical, chart, labs and discussed the procedure including the risks, benefits and alternatives for the proposed anesthesia with the patient or authorized representative who has indicated his/her understanding and acceptance.     Dental advisory given  Plan Discussed with: CRNA  Anesthesia Plan Comments:         Anesthesia Quick Evaluation

## 2019-05-19 NOTE — Interval H&P Note (Signed)
History and Physical Interval Note:  05/19/2019 7:24 AM  Isaac Doyle  has presented today for surgery, with the diagnosis of pancreatic cancer.  The various methods of treatment have been discussed with the patient and family. After consideration of risks, benefits and other options for treatment, the patient has consented to  Procedure(s): UPPER ENDOSCOPIC ULTRASOUND (EUS) RADIAL (N/A) ESOPHAGOGASTRODUODENOSCOPY (EGD) WITH PROPOFOL (N/A) as a surgical intervention.  The patient's history has been reviewed, patient examined, no change in status, stable for surgery.  I have reviewed the patient's chart and labs.  Questions were answered to the patient's satisfaction.     [name]

## 2019-05-19 NOTE — Discharge Instructions (Signed)
YOU HAD AN ENDOSCOPIC PROCEDURE TODAY: Refer to the procedure report and other information in the discharge instructions given to you for any specific questions about what was found during the examination. If this information does not answer your questions, please call [place] office at [phone] to clarify.  ° °YOU SHOULD EXPECT: Some feelings of bloating in the abdomen. Passage of more gas than usual. Walking can help get rid of the air that was put into your GI tract during the procedure and reduce the bloating. If you had a lower endoscopy (such as a colonoscopy or flexible sigmoidoscopy) you may notice spotting of blood in your stool or on the toilet paper. Some abdominal soreness may be present for a day or two, also. ° °DIET: Your first meal following the procedure should be a light meal and then it is ok to progress to your normal diet. A half-sandwich or bowl of soup is an example of a good first meal. Heavy or fried foods are harder to digest and may make you feel nauseous or bloated. Drink plenty of fluids but you should avoid alcoholic beverages for 24 hours. If you had a esophageal dilation, please see attached instructions for diet.   ° °ACTIVITY: Your care partner should take you home directly after the procedure. You should plan to take it easy, moving slowly for the rest of the day. You can resume normal activity the day after the procedure however YOU SHOULD NOT DRIVE, use power tools, machinery or perform tasks that involve climbing or major physical exertion for 24 hours (because of the sedation medicines used during the test).  ° °SYMPTOMS TO REPORT IMMEDIATELY: °A gastroenterologist can be reached at any hour. Please call [phone]  for any of the following symptoms:  °Following lower endoscopy (colonoscopy, flexible sigmoidoscopy) °Excessive amounts of blood in the stool  °Significant tenderness, worsening of abdominal pains  °Swelling of the abdomen that is new, acute  °Fever of 100° or  higher  °Following upper endoscopy (EGD, EUS, ERCP, esophageal dilation) °Vomiting of blood or coffee ground material  °New, significant abdominal pain  °New, significant chest pain or pain under the shoulder blades  °Painful or persistently difficult swallowing  °New shortness of breath  °Black, tarry-looking or red, bloody stools ° °FOLLOW UP:  °If any biopsies were taken you will be contacted by phone or by letter within the next 1-3 weeks. Call [phone]  if you have not heard about the biopsies in 3 weeks.  °Please also call with any specific questions about appointments or follow up tests. ° °

## 2019-05-20 ENCOUNTER — Encounter: Payer: Self-pay | Admitting: *Deleted

## 2019-05-23 ENCOUNTER — Telehealth: Payer: Self-pay | Admitting: Gastroenterology

## 2019-05-23 ENCOUNTER — Other Ambulatory Visit: Payer: Self-pay

## 2019-05-23 ENCOUNTER — Telehealth: Payer: Self-pay | Admitting: Hematology

## 2019-05-23 DIAGNOSIS — C259 Malignant neoplasm of pancreas, unspecified: Secondary | ICD-10-CM

## 2019-05-23 NOTE — Telephone Encounter (Signed)
Received a new pt referral from Dr. Ardis Hughs for dx of pancreatic cancer. I received a call from the pt's daughter to schedule an appt. [name] has been scheduled to see Dr. Burr Medico on 4/21 at 3pm. Aware to arrive 15 minutes early.

## 2019-05-23 NOTE — Telephone Encounter (Signed)
I spoke with his daughter and answered all of her questions.

## 2019-05-23 NOTE — Telephone Encounter (Signed)
Dr. Ardis Hughs, pt's daughter [name] would like to speak with you about pt's results. She stated that his father has memory issues so he did not fully comprehend his path results. Daisha's phone number is [phone]. Thank you.
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
## 2019-05-23 NOTE — Telephone Encounter (Signed)
Hi Dr. Ardis Hughs, pt returned your call. He stated that he will be available all day. Please call him back.

## 2019-05-24 NOTE — Progress Notes (Signed)
Patient returned my call, he is aware of his appointment tomorrow with Dr. Burr Medico to arrive at 2:45.  He has our location.  I explained my role as GI nurse navigator and he verbalized an understanding.  He states his daughter is going to be here for the appointment as well.

## 2019-05-24 NOTE — Progress Notes (Signed)
Left voice message for patient to my direct phone number for him to call me back to discuss his upcoming appointment and to explain my role as GI nurse navigator.

## 2019-05-25 ENCOUNTER — Other Ambulatory Visit: Payer: Self-pay

## 2019-05-25 ENCOUNTER — Encounter: Payer: Self-pay | Admitting: Medical Oncology

## 2019-05-25 ENCOUNTER — Inpatient Hospital Stay: Payer: No Typology Code available for payment source | Attending: Hematology | Admitting: Hematology

## 2019-05-25 ENCOUNTER — Telehealth: Payer: Self-pay | Admitting: General Practice

## 2019-05-25 ENCOUNTER — Encounter: Payer: Self-pay | Admitting: Hematology

## 2019-05-25 VITALS — BP 120/70 | HR 50 | Temp 98.7°F | Resp 17 | Ht 71.0 in | Wt 188.5 lb

## 2019-05-25 DIAGNOSIS — Z736 Limitation of activities due to disability: Secondary | ICD-10-CM | POA: Insufficient documentation

## 2019-05-25 DIAGNOSIS — F1020 Alcohol dependence, uncomplicated: Secondary | ICD-10-CM | POA: Diagnosis not present

## 2019-05-25 DIAGNOSIS — C250 Malignant neoplasm of head of pancreas: Secondary | ICD-10-CM | POA: Insufficient documentation

## 2019-05-25 DIAGNOSIS — Z79899 Other long term (current) drug therapy: Secondary | ICD-10-CM | POA: Insufficient documentation

## 2019-05-25 DIAGNOSIS — R600 Localized edema: Secondary | ICD-10-CM

## 2019-05-25 DIAGNOSIS — I10 Essential (primary) hypertension: Secondary | ICD-10-CM | POA: Diagnosis not present

## 2019-05-25 DIAGNOSIS — R634 Abnormal weight loss: Secondary | ICD-10-CM | POA: Diagnosis not present

## 2019-05-25 DIAGNOSIS — Z8673 Personal history of transient ischemic attack (TIA), and cerebral infarction without residual deficits: Secondary | ICD-10-CM

## 2019-05-25 DIAGNOSIS — D649 Anemia, unspecified: Secondary | ICD-10-CM

## 2019-05-25 MED ORDER — ONDANSETRON HCL 8 MG PO TABS: 8.0000 mg | ORAL_TABLET | Freq: Two times a day (BID) | ORAL | 1 refills | Status: DC | PRN

## 2019-05-25 MED ORDER — PROCHLORPERAZINE MALEATE 10 MG PO TABS: 10.0000 mg | ORAL_TABLET | Freq: Four times a day (QID) | ORAL | 1 refills | Status: DC | PRN

## 2019-05-25 NOTE — Progress Notes (Signed)
START ON PATHWAY REGIMEN - Pancreatic Adenocarcinoma     A cycle is every 14 days:     Oxaliplatin      Leucovorin      Irinotecan      Fluorouracil   **Always confirm dose/schedule in your pharmacy ordering system**  Patient Characteristics: Preoperative (Clinical Staging), Borderline Resectable, PS = 0,1 Therapeutic Status: Preoperative (Clinical Staging) AJCC T Category: cT4 AJCC N Category: cN1 Resectability Status: Borderline Resectable AJCC M Category: cM0 AJCC 8 Stage Grouping: III ECOG Performance Status: 1 Intent of Therapy: Curative Intent, Discussed with Patient

## 2019-05-25 NOTE — Progress Notes (Signed)
[employer]: Blood Sample Collection to Evaluate Biomarkers in Subjects with Untreated Solid Tumors Dr. Burr Medico referred patient to study this afternoon. I met with patient and family member, after his appointment with MD. Patient confirms that MD gave him a brief overview of the study and patient expressed interest in knowing more. I reviewed the study with patient and family, page by page. Patient confirms understanding that participation is voluntary, that it is a one-time blood collection, unless first collection is found to be unusable then we may need to draw again. Patient confirms understanding that the study will collect smoking and drinking history as well as medical and medication history. Patient also confirms understanding as to how his information will be used and protected, any risks or benefits and the study provided compensation once blood is collected. After all of patient's questions were answered to his satisfaction, patient proceeded to sign, date and time consent and study HIPAA where indicated. Patient was provided a copy of the signed consent form, a Clinical Trials information pamphlet as well as my contact information. Patient was also informed that we will collect the study blood sample before any treatment and as soon as Dr. Burr Medico schedules him for a lab appointment. Patient and family were thanked for their time and interest in study and encouraged to call with questions or concerns.  Maxwell Marion, RN, BSN, [facility] Clinical Research 05/25/2019 4:21 PM

## 2019-05-25 NOTE — Progress Notes (Signed)
Met with patient and his daughter Isaac Doyle today at initial medical oncology appointment with Dr. Burr Medico.  I had spoken to the patient previously on the phone.  I reviewed my role as GI navigator and they were given my direct phone number and encouraged to call with any questions or concerns.  He is mainly concerned over finding out today what kind of treatment there is and is uncertain if he wants to pursue chemotherapy.  His mother died at age 66 from a cancer ? In her lymph nodes.  Father died at age 69 from massive MI.  He has seen how bad chemotherapy/radiation can be and is unsure.  I explained Dr. Burr Medico would discuss with him today and hopefully he will have a better understanding of his newly diagnosed Pancreatic Cancer.  No other barriers were identified at this time.

## 2019-05-25 NOTE — Telephone Encounter (Signed)
[facility] CSW Progress Notes  REquest from Dr Burr Medico to help patient w needed transportation.  Spoke w daughter [name], referred to [employer].  Also advised that patient may benefit from resources available to him for in home care from the [facility].  He last saw his [facility] PCP on 4/5.  Advised that patient should call PCP's office, ask to speak w social worker, explain his situation/needs.  Daughter is interested in working as his caregiver should that become necessary - advised that this request needs to be processed through the [place] and that CSW is unsure whether this is a benefit available to patient.    Edwyna Shell, LCSW Clinical Social Worker Phone:  [phone] Cell:  [phone]

## 2019-05-26 ENCOUNTER — Other Ambulatory Visit: Payer: Self-pay

## 2019-05-26 ENCOUNTER — Other Ambulatory Visit: Payer: Self-pay | Admitting: Medical Oncology

## 2019-05-26 ENCOUNTER — Telehealth: Payer: Self-pay | Admitting: Hematology

## 2019-05-26 DIAGNOSIS — R634 Abnormal weight loss: Secondary | ICD-10-CM

## 2019-05-26 DIAGNOSIS — C250 Malignant neoplasm of head of pancreas: Secondary | ICD-10-CM

## 2019-05-26 NOTE — Progress Notes (Signed)
Scheduled patient's port-a-cath placement. Spoke to patient informed him port will be placed on Friday 4/30 at [facility], he needs to arrive by 8:00 am, northing to eat or drink after midnight and he must have a driver.  He wrote the information down and was able to repeat it back.   Explained that his other appointments to start treatment are in process and he will be called.  He verbalized an understanding.

## 2019-05-26 NOTE — Telephone Encounter (Signed)
Scheduled appt per 4/21 los.  Spoke with pt daughter and she is aware of the appt date and times.

## 2019-06-01 ENCOUNTER — Other Ambulatory Visit: Payer: Self-pay

## 2019-06-02 ENCOUNTER — Encounter (HOSPITAL_COMMUNITY): Payer: Self-pay | Admitting: Emergency Medicine

## 2019-06-02 ENCOUNTER — Telehealth: Payer: Self-pay | Admitting: Hematology

## 2019-06-02 ENCOUNTER — Inpatient Hospital Stay: Payer: No Typology Code available for payment source | Admitting: Nutrition

## 2019-06-02 ENCOUNTER — Other Ambulatory Visit: Payer: Self-pay | Admitting: Hematology

## 2019-06-02 ENCOUNTER — Encounter: Payer: Self-pay | Admitting: Hematology

## 2019-06-02 ENCOUNTER — Inpatient Hospital Stay: Payer: No Typology Code available for payment source

## 2019-06-02 ENCOUNTER — Inpatient Hospital Stay (HOSPITAL_COMMUNITY)
Admission: EM | Admit: 2019-06-02 | Discharge: 2019-06-04 | DRG: 919 | Disposition: A | Discharge status: Home / Self Care | Payer: No Typology Code available for payment source | Attending: Internal Medicine | Admitting: Internal Medicine

## 2019-06-02 ENCOUNTER — Other Ambulatory Visit: Payer: Self-pay

## 2019-06-02 ENCOUNTER — Telehealth: Payer: Self-pay | Admitting: Gastroenterology

## 2019-06-02 ENCOUNTER — Telehealth: Payer: Self-pay

## 2019-06-02 DIAGNOSIS — Z20822 Contact with and (suspected) exposure to covid-19: Secondary | ICD-10-CM | POA: Diagnosis present

## 2019-06-02 DIAGNOSIS — Y838 Other surgical procedures as the cause of abnormal reaction of the patient, or of later complication, without mention of misadventure at the time of the procedure: Secondary | ICD-10-CM | POA: Diagnosis present

## 2019-06-02 DIAGNOSIS — C250 Malignant neoplasm of head of pancreas: Secondary | ICD-10-CM

## 2019-06-02 DIAGNOSIS — T85590A Other mechanical complication of bile duct prosthesis, initial encounter: Secondary | ICD-10-CM | POA: Diagnosis not present

## 2019-06-02 DIAGNOSIS — D649 Anemia, unspecified: Secondary | ICD-10-CM

## 2019-06-02 DIAGNOSIS — E876 Hypokalemia: Secondary | ICD-10-CM | POA: Diagnosis present

## 2019-06-02 DIAGNOSIS — C801 Malignant (primary) neoplasm, unspecified: Secondary | ICD-10-CM | POA: Diagnosis present

## 2019-06-02 DIAGNOSIS — K831 Obstruction of bile duct: Secondary | ICD-10-CM | POA: Diagnosis present

## 2019-06-02 DIAGNOSIS — R82998 Other abnormal findings in urine: Secondary | ICD-10-CM

## 2019-06-02 DIAGNOSIS — Z79899 Other long term (current) drug therapy: Secondary | ICD-10-CM

## 2019-06-02 DIAGNOSIS — R17 Unspecified jaundice: Secondary | ICD-10-CM | POA: Diagnosis present

## 2019-06-02 DIAGNOSIS — L299 Pruritus, unspecified: Secondary | ICD-10-CM | POA: Diagnosis present

## 2019-06-02 DIAGNOSIS — Z79891 Long term (current) use of opiate analgesic: Secondary | ICD-10-CM

## 2019-06-02 DIAGNOSIS — E871 Hypo-osmolality and hyponatremia: Secondary | ICD-10-CM | POA: Diagnosis present

## 2019-06-02 DIAGNOSIS — C259 Malignant neoplasm of pancreas, unspecified: Secondary | ICD-10-CM | POA: Diagnosis present

## 2019-06-02 DIAGNOSIS — Z9689 Presence of other specified functional implants: Secondary | ICD-10-CM | POA: Diagnosis present

## 2019-06-02 DIAGNOSIS — I10 Essential (primary) hypertension: Secondary | ICD-10-CM | POA: Diagnosis present

## 2019-06-02 DIAGNOSIS — C787 Secondary malignant neoplasm of liver and intrahepatic bile duct: Secondary | ICD-10-CM | POA: Diagnosis present

## 2019-06-02 DIAGNOSIS — Z7289 Other problems related to lifestyle: Secondary | ICD-10-CM

## 2019-06-02 LAB — VITAMIN B12: Vitamin B-12: 495 pg/mL (ref 180–914)

## 2019-06-02 LAB — CMP (CANCER CENTER ONLY)
ALT: 147 U/L — ABNORMAL HIGH (ref 0–44)
AST: 144 U/L — ABNORMAL HIGH (ref 15–41)
Albumin: 3.1 g/dL — ABNORMAL LOW (ref 3.5–5.0)
Alkaline Phosphatase: 752 U/L — ABNORMAL HIGH (ref 38–126)
Anion gap: 14 (ref 5–15)
BUN: 12 mg/dL (ref 8–23)
CO2: 29 mmol/L (ref 22–32)
Calcium: 10.7 mg/dL — ABNORMAL HIGH (ref 8.9–10.3)
Chloride: 93 mmol/L — ABNORMAL LOW (ref 98–111)
Creatinine: 1.35 mg/dL — ABNORMAL HIGH (ref 0.61–1.24)
GFR, Est AFR Am: >60 mL/min (ref >60)
GFR, Estimated: 55 mL/min — ABNORMAL LOW (ref >60)
Glucose, Bld: 125 mg/dL — ABNORMAL HIGH (ref 70–99)
Potassium: 3.1 mmol/L — ABNORMAL LOW (ref 3.5–5.1)
Sodium: 136 mmol/L (ref 135–145)
Total Bilirubin: 21.7 mg/dL (ref 0.3–1.2)
Total Protein: 7.9 g/dL (ref 6.5–8.1)

## 2019-06-02 LAB — URINALYSIS, ROUTINE W REFLEX MICROSCOPIC
Bacteria, UA: NONE SEEN
Glucose, UA: NEGATIVE mg/dL
Ketones, ur: NEGATIVE mg/dL
Nitrite: NEGATIVE
Protein, ur: 100 mg/dL — AB
Specific Gravity, Urine: 1.019 (ref 1.005–1.030)
pH: 5 (ref 5.0–8.0)

## 2019-06-02 LAB — CBC WITH DIFFERENTIAL (CANCER CENTER ONLY)
Abs Immature Granulocytes: 0.03 10*3/uL (ref 0.00–0.07)
Basophils Absolute: 0 10*3/uL (ref 0.0–0.1)
Basophils Relative: 0 %
Eosinophils Absolute: 0.1 10*3/uL (ref 0.0–0.5)
Eosinophils Relative: 1 %
HCT: 36.3 % — ABNORMAL LOW (ref 39.0–52.0)
Hemoglobin: 12.3 g/dL — ABNORMAL LOW (ref 13.0–17.0)
Immature Granulocytes: 0 %
Lymphocytes Relative: 24 %
Lymphs Abs: 2.1 10*3/uL (ref 0.7–4.0)
MCH: 29.9 pg (ref 26.0–34.0)
MCHC: 33.9 g/dL (ref 30.0–36.0)
MCV: 88.1 fL (ref 80.0–100.0)
Monocytes Absolute: 0.5 10*3/uL (ref 0.1–1.0)
Monocytes Relative: 6 %
Neutro Abs: 6.2 10*3/uL (ref 1.7–7.7)
Neutrophils Relative %: 69 %
Platelet Count: 342 10*3/uL (ref 150–400)
RBC: 4.12 MIL/uL — ABNORMAL LOW (ref 4.22–5.81)
RDW: 13.2 % (ref 11.5–15.5)
WBC Count: 9 10*3/uL (ref 4.0–10.5)
nRBC: 0 % (ref 0.0–0.2)

## 2019-06-02 LAB — URINALYSIS, COMPLETE (UACMP) WITH MICROSCOPIC
Glucose, UA: NEGATIVE mg/dL
Ketones, ur: NEGATIVE mg/dL
Nitrite: NEGATIVE
Protein, ur: 100 mg/dL — AB
Specific Gravity, Urine: 1.02 (ref 1.005–1.030)
WBC, UA: >50 WBC/hpf — ABNORMAL HIGH (ref 0–5)
pH: 5 (ref 5.0–8.0)

## 2019-06-02 LAB — RETIC PANEL
Immature Retic Fract: 4.2 % (ref 2.3–15.9)
RBC.: 4.17 MIL/uL — ABNORMAL LOW (ref 4.22–5.81)
Retic Count, Absolute: 109.3 10*3/uL (ref 19.0–186.0)
Retic Ct Pct: 2.6 % (ref 0.4–3.1)
Reticulocyte Hemoglobin: 33.7 pg (ref >27.9)

## 2019-06-02 NOTE — ED Triage Notes (Signed)
Pt reports an increase in dark urine and yellowing of the eyes. Recently diagnosed with pancreatic cancer.

## 2019-06-02 NOTE — Telephone Encounter (Signed)
Critical Value:  Total Bilirubin: 21.7  Dr. Burr Medico notified

## 2019-06-02 NOTE — Telephone Encounter (Signed)
I spoke with the pt and she tells me that she just spoke with oncology and was advised to take the pt to the ED for eval.  The pt daughter agreed.

## 2019-06-02 NOTE — Progress Notes (Signed)
Pharmacist Chemotherapy Monitoring - Initial Assessment    Anticipated start date: 06/08/19  Regimen:  . Are orders appropriate based on the patient's diagnosis, regimen, and cycle? Yes . Does the plan date match the patient's scheduled date? Yes . Is the sequencing of drugs appropriate? Yes . Are the premedications appropriate for the patient's regimen? Yes . Prior Authorization for treatment is: Not Started o If applicable, is the correct biosimilar selected based on the patient's insurance? not applicable  Organ Function and Labs: Marland Kitchen Are dose adjustments needed based on the patient's renal function, hepatic function, or hematologic function? No . Are appropriate labs ordered prior to the start of patient's treatment? Yes . Other organ system assessment, if indicated: N/A . The following baseline labs, if indicated, have been ordered: N/A  Dose Assessment: . Are the drug doses appropriate? Yes . Are the following correct: o Drug concentrations Yes o IV fluid compatible with drug Yes o Administration routes Yes o Timing of therapy Yes . If applicable, does the patient have documented access for treatment and/or plans for port-a-cath placement? yes . If applicable, have lifetime cumulative doses been properly documented and assessed? not applicable Lifetime Dose Tracking  No doses have been documented on this patient for the following tracked chemicals: Doxorubicin, Epirubicin, Idarubicin, Daunorubicin, Mitoxantrone, Bleomycin, Oxaliplatin, Carboplatin, Liposomal Doxorubicin  o   Toxicity Monitoring/Prevention: . The patient has the following take home antiemetics prescribed: Prochlorperazine . The patient has the following take home medications prescribed: N/A . Medication allergies and previous infusion related reactions, if applicable, have been reviewed and addressed. Yes . The patient's current medication list has been assessed for drug-drug interactions with their chemotherapy  regimen. no significant drug-drug interactions were identified on review.  Order Review: . Are the treatment plan orders signed? Yes . Is the patient scheduled to see a provider prior to their treatment? Yes  I verify that I have reviewed each item in the above checklist and answered each question accordingly.  Philomena Course 06/02/2019 10:36 AM

## 2019-06-02 NOTE — Progress Notes (Signed)
Met with patient at registration to introduce myself as Arboriculturist and to offer available resources.  Discussed one-time $1000 Radio broadcast assistant to assist with personal expenses while going through treatment.Advised what is needed to apply. He will bring on next visit 06/08/19.  He has my card for any additional financial questions or concerns.

## 2019-06-02 NOTE — Telephone Encounter (Signed)
Pt's daughter stated that pt was recently diagnosed with pancreatic cancer and now has jaundice and brown urine.

## 2019-06-02 NOTE — Progress Notes (Signed)
64 year old male diagnosed with pancreas cancer.  He is a patient of Dr. Burr Medico.  Past medical history includes alcohol, hypertension  Medications include magnesium oxide, Zofran, Compazine.  Labs were reviewed.  Height: 5 feet 11 inches. Weight: 188.5 pounds. Usual body weight: 215 pounds. BMI: 26.29.  Patient reports his appetite is picking up.   He states he lives alone and cooks for himself.  States he can cook anything and appears to like a good variety of foods. He currently denies nutrition impact symptoms. He endorses weight loss.  Nutrition diagnosis:  Inadequate oral intake related to pancreas cancer as evidenced by 12% weight loss from usual body weight.  Intervention: Educated patient to consume smaller more frequent meals and snacks with increased calories and protein. Reviewed goal of weight maintenance. Encourage patient to consume Ensure Enlive or boost plus 3 times daily between meals Provided 1 complementary case. Strategies provided on bowel regimen. Encouraged increased fluid intake. Questions were answered and teach back method used.  Monitoring, evaluation, goals: Patient will tolerate increased calories and protein to minimize weight loss  Next visit: To be scheduled as needed  **Disclaimer: This note was dictated with voice recognition software. Similar sounding words can inadvertently be transcribed and this note may contain transcription errors which may not have been corrected upon publication of note.**

## 2019-06-02 NOTE — Telephone Encounter (Signed)
Patient came in today for chemo class, and reported dark urine again since yesterday.  He denies fever, chills, or other new symptoms.  Lab was obtained after his chemo class, which showed total bilirubin 21.7.  I contacted GI Dr. Ardis Hughs, who suggested hospital admission for stent exchange.  I have called patient, spoke with his daughter who was with pt, and advised him to go to [facility] emergency room tonight for admission.  They voiced good understanding and agrees with the plan.  He is scheduled with [facility] for port placement tomorrow morning, OK to proceed if no clinical concerns of infection. I will f/u tomorrow. Please consult [facility] after hospital admission.   Truitt Merle  06/02/2019  4:54 PM

## 2019-06-03 ENCOUNTER — Encounter (HOSPITAL_COMMUNITY): Payer: Self-pay | Admitting: Emergency Medicine

## 2019-06-03 ENCOUNTER — Inpatient Hospital Stay (HOSPITAL_COMMUNITY): Payer: No Typology Code available for payment source

## 2019-06-03 ENCOUNTER — Inpatient Hospital Stay (HOSPITAL_COMMUNITY): Payer: No Typology Code available for payment source | Admitting: Anesthesiology

## 2019-06-03 ENCOUNTER — Ambulatory Visit (HOSPITAL_COMMUNITY): Admission: RE | Admit: 2019-06-03 | Payer: No Typology Code available for payment source | Source: Ambulatory Visit

## 2019-06-03 ENCOUNTER — Encounter (HOSPITAL_COMMUNITY)
Admission: EM | Disposition: A | Discharge status: Home / Self Care | Payer: Self-pay | Source: Home / Self Care | Attending: Family Medicine

## 2019-06-03 DIAGNOSIS — C259 Malignant neoplasm of pancreas, unspecified: Secondary | ICD-10-CM | POA: Diagnosis present

## 2019-06-03 DIAGNOSIS — Z7289 Other problems related to lifestyle: Secondary | ICD-10-CM | POA: Diagnosis not present

## 2019-06-03 DIAGNOSIS — T85590A Other mechanical complication of bile duct prosthesis, initial encounter: Secondary | ICD-10-CM | POA: Diagnosis present

## 2019-06-03 DIAGNOSIS — R17 Unspecified jaundice: Secondary | ICD-10-CM

## 2019-06-03 DIAGNOSIS — I10 Essential (primary) hypertension: Secondary | ICD-10-CM | POA: Diagnosis present

## 2019-06-03 DIAGNOSIS — C801 Malignant (primary) neoplasm, unspecified: Secondary | ICD-10-CM

## 2019-06-03 DIAGNOSIS — C250 Malignant neoplasm of head of pancreas: Secondary | ICD-10-CM | POA: Diagnosis not present

## 2019-06-03 DIAGNOSIS — Z9689 Presence of other specified functional implants: Secondary | ICD-10-CM | POA: Diagnosis present

## 2019-06-03 DIAGNOSIS — Z79891 Long term (current) use of opiate analgesic: Secondary | ICD-10-CM | POA: Diagnosis not present

## 2019-06-03 DIAGNOSIS — Z20822 Contact with and (suspected) exposure to covid-19: Secondary | ICD-10-CM | POA: Diagnosis present

## 2019-06-03 DIAGNOSIS — E876 Hypokalemia: Secondary | ICD-10-CM | POA: Diagnosis present

## 2019-06-03 DIAGNOSIS — E871 Hypo-osmolality and hyponatremia: Secondary | ICD-10-CM | POA: Diagnosis present

## 2019-06-03 DIAGNOSIS — Z79899 Other long term (current) drug therapy: Secondary | ICD-10-CM | POA: Diagnosis not present

## 2019-06-03 DIAGNOSIS — K831 Obstruction of bile duct: Secondary | ICD-10-CM | POA: Diagnosis present

## 2019-06-03 DIAGNOSIS — Y838 Other surgical procedures as the cause of abnormal reaction of the patient, or of later complication, without mention of misadventure at the time of the procedure: Secondary | ICD-10-CM | POA: Diagnosis present

## 2019-06-03 DIAGNOSIS — L299 Pruritus, unspecified: Secondary | ICD-10-CM | POA: Diagnosis present

## 2019-06-03 HISTORY — PX: ERCP: SHX5425

## 2019-06-03 HISTORY — PX: STENT REMOVAL: SHX6421

## 2019-06-03 HISTORY — PX: BILIARY STENT PLACEMENT: SHX5538

## 2019-06-03 LAB — CBC WITH DIFFERENTIAL/PLATELET
Abs Immature Granulocytes: 0.01 10*3/uL (ref 0.00–0.07)
Basophils Absolute: 0 10*3/uL (ref 0.0–0.1)
Basophils Relative: 1 %
Eosinophils Absolute: 0.1 10*3/uL (ref 0.0–0.5)
Eosinophils Relative: 1 %
HCT: 35.8 % — ABNORMAL LOW (ref 39.0–52.0)
Hemoglobin: 11.7 g/dL — ABNORMAL LOW (ref 13.0–17.0)
Immature Granulocytes: 0 %
Lymphocytes Relative: 25 %
Lymphs Abs: 1.6 10*3/uL (ref 0.7–4.0)
MCH: 29.2 pg (ref 26.0–34.0)
MCHC: 32.7 g/dL (ref 30.0–36.0)
MCV: 89.3 fL (ref 80.0–100.0)
Monocytes Absolute: 0.4 10*3/uL (ref 0.1–1.0)
Monocytes Relative: 6 %
Neutro Abs: 4.3 10*3/uL (ref 1.7–7.7)
Neutrophils Relative %: 67 %
Platelets: 288 10*3/uL (ref 150–400)
RBC: 4.01 MIL/uL — ABNORMAL LOW (ref 4.22–5.81)
RDW: 13.4 % (ref 11.5–15.5)
WBC: 6.5 10*3/uL (ref 4.0–10.5)
nRBC: 0 % (ref 0.0–0.2)

## 2019-06-03 LAB — COMPREHENSIVE METABOLIC PANEL
ALT: 129 U/L — ABNORMAL HIGH (ref 0–44)
AST: 136 U/L — ABNORMAL HIGH (ref 15–41)
Albumin: 3 g/dL — ABNORMAL LOW (ref 3.5–5.0)
Alkaline Phosphatase: 594 U/L — ABNORMAL HIGH (ref 38–126)
Anion gap: 15 (ref 5–15)
BUN: 10 mg/dL (ref 8–23)
CO2: 24 mmol/L (ref 22–32)
Calcium: 9.7 mg/dL (ref 8.9–10.3)
Chloride: 91 mmol/L — ABNORMAL LOW (ref 98–111)
Creatinine, Ser: 1.16 mg/dL (ref 0.61–1.24)
GFR calc Af Amer: >60 mL/min (ref >60)
GFR calc non Af Amer: >60 mL/min (ref >60)
Glucose, Bld: 146 mg/dL — ABNORMAL HIGH (ref 70–99)
Potassium: 2.9 mmol/L — ABNORMAL LOW (ref 3.5–5.1)
Sodium: 130 mmol/L — ABNORMAL LOW (ref 135–145)
Total Bilirubin: 22 mg/dL (ref 0.3–1.2)
Total Protein: 7.3 g/dL (ref 6.5–8.1)

## 2019-06-03 LAB — IRON AND TIBC
Iron: 42 ug/dL (ref 42–163)
Saturation Ratios: 15 % — ABNORMAL LOW (ref 20–55)
TIBC: 281 ug/dL (ref 202–409)
UIBC: 239 ug/dL (ref 117–376)

## 2019-06-03 LAB — PROTIME-INR
INR: 1 (ref 0.8–1.2)
Prothrombin Time: 12.7 seconds (ref 11.4–15.2)

## 2019-06-03 LAB — FOLATE RBC
Folate, Hemolysate: 344 ng/mL
Folate, RBC: 930 ng/mL (ref >498)
Hematocrit: 37 % — ABNORMAL LOW (ref 37.5–51.0)

## 2019-06-03 LAB — FERRITIN: Ferritin: 4690 ng/mL — ABNORMAL HIGH (ref 24–336)

## 2019-06-03 LAB — LIPASE, BLOOD: Lipase: 17 U/L (ref 11–51)

## 2019-06-03 LAB — RESPIRATORY PANEL BY RT PCR (FLU A&B, COVID)
Influenza A by PCR: NEGATIVE
Influenza B by PCR: NEGATIVE
SARS Coronavirus 2 by RT PCR: NEGATIVE

## 2019-06-03 LAB — CANCER ANTIGEN 19-9: CA 19-9: 4261 U/mL — ABNORMAL HIGH (ref 0–35)

## 2019-06-03 LAB — MAGNESIUM: Magnesium: 1.7 mg/dL (ref 1.7–2.4)

## 2019-06-03 SURGERY — ERCP, WITH INTERVENTION IF INDICATED
Anesthesia: General

## 2019-06-03 MED ORDER — PHENYLEPHRINE HCL-NACL 10-0.9 MG/250ML-% IV SOLN
INTRAVENOUS | Status: DC | PRN
Start: 2019-06-03 — End: 2019-06-03
  Administered 2019-06-03: 50 ug/min via INTRAVENOUS

## 2019-06-03 MED ORDER — AMLODIPINE BESYLATE 5 MG PO TABS: 10.0000 mg | ORAL_TABLET | Freq: Every day | ORAL | Status: DC

## 2019-06-03 MED ORDER — GLUCAGON HCL RDNA (DIAGNOSTIC) 1 MG IJ SOLR
INTRAMUSCULAR | Status: DC | PRN
Start: 2019-06-03 — End: 2019-06-03
  Administered 2019-06-03: .25 mg via INTRAVENOUS

## 2019-06-03 MED ORDER — ONDANSETRON HCL 4 MG/2ML IJ SOLN
4.0000 mg | Freq: Four times a day (QID) | INTRAMUSCULAR | Status: DC | PRN
  Administered 2019-06-03: 4 mg via INTRAVENOUS

## 2019-06-03 MED ORDER — POTASSIUM CHLORIDE IN NACL 40-0.9 MEQ/L-% IV SOLN
INTRAVENOUS | Status: DC
  Administered 2019-06-03 (×2): 125 mL/h via INTRAVENOUS
  Filled 2019-06-03 (×2): qty 1000

## 2019-06-03 MED ORDER — PROPOFOL 10 MG/ML IV BOLUS
INTRAVENOUS | Status: DC | PRN
  Administered 2019-06-03: 120 mg via INTRAVENOUS

## 2019-06-03 MED ORDER — LIDOCAINE 2% (20 MG/ML) 5 ML SYRINGE
INTRAMUSCULAR | Status: DC | PRN
  Administered 2019-06-03: 60 mg via INTRAVENOUS

## 2019-06-03 MED ORDER — ROCURONIUM BROMIDE 10 MG/ML (PF) SYRINGE
PREFILLED_SYRINGE | INTRAVENOUS | Status: DC | PRN
  Administered 2019-06-03: 40 mg via INTRAVENOUS

## 2019-06-03 MED ORDER — POTASSIUM CHLORIDE CRYS ER 20 MEQ PO TBCR
40.0000 meq | EXTENDED_RELEASE_TABLET | Freq: Every day | ORAL | Status: DC
  Administered 2019-06-03 – 2019-06-04 (×2): 40 meq via ORAL
  Filled 2019-06-03 (×2): qty 2

## 2019-06-03 MED ORDER — SULINDAC 200 MG PO TABS: 400.0000 mg | ORAL_TABLET | Freq: Two times a day (BID) | ORAL | Status: DC

## 2019-06-03 MED ORDER — MAGNESIUM OXIDE 400 (241.3 MG) MG PO TABS
400.0000 mg | ORAL_TABLET | Freq: Every day | ORAL | Status: DC
  Administered 2019-06-03 – 2019-06-04 (×2): 400 mg via ORAL
  Filled 2019-06-03 (×2): qty 1

## 2019-06-03 MED ORDER — DEXAMETHASONE SODIUM PHOSPHATE 10 MG/ML IJ SOLN
INTRAMUSCULAR | Status: DC | PRN
  Administered 2019-06-03: 10 mg via INTRAVENOUS

## 2019-06-03 MED ORDER — SODIUM CHLORIDE 0.9 % IV SOLN
INTRAVENOUS | Status: DC | PRN
  Administered 2019-06-03: 20 mL

## 2019-06-03 MED ORDER — POTASSIUM CHLORIDE IN NACL 20-0.9 MEQ/L-% IV SOLN
INTRAVENOUS | Status: DC
  Filled 2019-06-03: qty 1000

## 2019-06-03 MED ORDER — PROCHLORPERAZINE MALEATE 10 MG PO TABS
10.0000 mg | ORAL_TABLET | Freq: Four times a day (QID) | ORAL | Status: DC | PRN
  Filled 2019-06-03: qty 1

## 2019-06-03 MED ORDER — PHENYLEPHRINE 40 MCG/ML (10ML) SYRINGE FOR IV PUSH (FOR BLOOD PRESSURE SUPPORT)
PREFILLED_SYRINGE | INTRAVENOUS | Status: DC | PRN
  Administered 2019-06-03: 120 ug via INTRAVENOUS
  Administered 2019-06-03: 80 ug via INTRAVENOUS

## 2019-06-03 MED ORDER — ATENOLOL 25 MG PO TABS
25.0000 mg | ORAL_TABLET | Freq: Every day | ORAL | Status: DC
  Administered 2019-06-04: 25 mg via ORAL
  Filled 2019-06-03: qty 1

## 2019-06-03 MED ORDER — HYDROXYZINE HCL 25 MG PO TABS: 25.0000 mg | ORAL_TABLET | Freq: Once | ORAL | Status: DC

## 2019-06-03 MED ORDER — INDOMETHACIN 50 MG RE SUPP: 100.0000 mg | Freq: Once | RECTAL | Status: DC

## 2019-06-03 MED ORDER — IBUPROFEN 200 MG PO TABS: 200.0000 mg | ORAL_TABLET | Freq: Four times a day (QID) | ORAL | Status: DC | PRN

## 2019-06-03 MED ORDER — GLUCAGON HCL RDNA (DIAGNOSTIC) 1 MG IJ SOLR
INTRAMUSCULAR | Status: AC
  Filled 2019-06-03: qty 1

## 2019-06-03 MED ORDER — TRAZODONE HCL 100 MG PO TABS
100.0000 mg | ORAL_TABLET | Freq: Every day | ORAL | Status: DC
  Administered 2019-06-03: 100 mg via ORAL
  Filled 2019-06-03: qty 1

## 2019-06-03 MED ORDER — CIPROFLOXACIN IN D5W 400 MG/200ML IV SOLN
INTRAVENOUS | Status: AC
  Filled 2019-06-03: qty 200

## 2019-06-03 MED ORDER — EPHEDRINE SULFATE-NACL 50-0.9 MG/10ML-% IV SOSY
PREFILLED_SYRINGE | INTRAVENOUS | Status: DC | PRN
  Administered 2019-06-03: 10 mg via INTRAVENOUS

## 2019-06-03 MED ORDER — FENTANYL CITRATE (PF) 250 MCG/5ML IJ SOLN
INTRAMUSCULAR | Status: DC | PRN
  Administered 2019-06-03: 100 ug via INTRAVENOUS

## 2019-06-03 MED ORDER — INDOMETHACIN 50 MG RE SUPP
RECTAL | Status: DC | PRN
  Administered 2019-06-03: 100 mg via RECTAL

## 2019-06-03 MED ORDER — INDOMETHACIN 50 MG RE SUPP
RECTAL | Status: AC
  Filled 2019-06-03: qty 2

## 2019-06-03 MED ORDER — CIPROFLOXACIN IN D5W 400 MG/200ML IV SOLN
INTRAVENOUS | Status: DC | PRN
Start: 2019-06-03 — End: 2019-06-03
  Administered 2019-06-03: 400 mg via INTRAVENOUS

## 2019-06-03 MED ORDER — ATENOLOL 50 MG PO TABS: 100.0000 mg | ORAL_TABLET | Freq: Every day | ORAL | Status: DC

## 2019-06-03 MED ORDER — MAGNESIUM SULFATE 2 GM/50ML IV SOLN
2.0000 g | Freq: Once | INTRAVENOUS | Status: AC
  Administered 2019-06-03: 2 g via INTRAVENOUS
  Filled 2019-06-03: qty 50

## 2019-06-03 MED ORDER — SUGAMMADEX SODIUM 200 MG/2ML IV SOLN
INTRAVENOUS | Status: DC | PRN
  Administered 2019-06-03: 200 mg via INTRAVENOUS

## 2019-06-03 MED ORDER — ONDANSETRON HCL 4 MG PO TABS: 4.0000 mg | ORAL_TABLET | Freq: Four times a day (QID) | ORAL | Status: DC | PRN

## 2019-06-03 MED ORDER — SODIUM CHLORIDE 0.9 % IV SOLN: INTRAVENOUS | Status: DC

## 2019-06-03 NOTE — ED Notes (Signed)
Assumed care on patient , denies pain /respirations unlabored , IV site intact , waiting for in-patient bed assignment .

## 2019-06-03 NOTE — Transfer of Care (Signed)
Immediate Anesthesia Transfer of Care Note  Patient: Isaac Doyle  Procedure(s) Performed: ENDOSCOPIC RETROGRADE CHOLANGIOPANCREATOGRAPHY (ERCP) (N/A ) STENT REMOVAL BILIARY STENT PLACEMENT  Patient Location: Endoscopy Unit  Anesthesia Type:General  Level of Consciousness: awake  Airway & Oxygen Therapy: Patient Spontanous Breathing and Patient connected to face mask oxygen  Post-op Assessment: Report given to RN and Post -op Vital signs reviewed and stable  Post vital signs: Reviewed and stable  Last Vitals:  Vitals Value Taken Time  BP    Temp    Pulse    Resp    SpO2      Last Pain:  Vitals:   06/03/19 1025  TempSrc: Oral  PainSc: 0-No pain         Complications: No apparent anesthesia complications

## 2019-06-03 NOTE — Progress Notes (Signed)
POST ERCP NOTE:  Pt doing well. No abdominal pain or nausea or melena. Vitals stable. Abdominal Exam: soft, nontender.  Discussed with pt Advance diet to low fat diet. Check CBC, CMP in AM  Isaac Doyle

## 2019-06-03 NOTE — Progress Notes (Signed)
          Daily Rounding Note  06/03/2019, 9:11 AM  LOS: 0 days   SUBJECTIVE:   Chief complaint: dark urine and pruritus    Pt developed obstructive jaundice earlier this month and underwent ERCPs with stenting on 4/6.  On 4/15 EUS with cytology confirmed adenocarcinoma. After stenting patient's bilirubin went down from 42.3 to 20.1 on 4/9.  Other LFTs also improved. Over the last couple of days patient has noticed recurrent dark urine and suffering from pruritus.  Bilirubin back to 21.7, alkaline phosphatase 752, AST/ALT 144/147 all of which are elevated.  CA 19-9 also rising from 2411 to current 4261.  WBCs normal.  INR 1, this is improved from 2.4 3 weeks prior COVID-19 negative.  Patient attended chemo class yesterday and after reporting his pruritus and dark urine, LFTs were obtained showing rise.  Dr. Krista Blue spoke with Dr. Ardis Hughs who recommended patient coming to hospital in order to expedite ERCPs and stent exchange. Procedure is set up for around 1030 this morning with Dr. Lyndel Safe.  No fevers, no chills, no nausea, vomiting, no abdominal pain.    OBJECTIVE:         Vital signs in last 24 hours:    Temp:  [97.9 F (36.6 C)-98.4 F (36.9 C)] 97.9 F (36.6 C) (04/29 2128) Pulse Rate:  [47-74] 55 (04/30 0630) Resp:  [14-18] 18 (04/30 0630) BP: (106-135)/(60-77) 135/73 (04/30 0630) SpO2:  [98 %-100 %] 99 % (04/30 0630) Weight:  [86 kg] 86 kg (04/29 1835)   Filed Weights   06/02/19 1835  Weight: 86 kg   General: Thin, pleasant.  Does not look acutely ill.  Scleral icterus. Heart: RRR. Chest: No labored breathing, lungs clear but overall diminished breath sounds. Abdomen: Soft, nontender, nondistended.  Active bowel sounds. Extremities: No CCE. Neuro/Psych: Calm, pleasant, cooperative.  No gross deficits/weakness.  No tremors.  Intake/Output from previous day: No intake/output data recorded.  Intake/Output this shift: No  intake/output data recorded.  Lab Results: Recent Labs    06/02/19 1537 06/03/19 0146  WBC 9.0 6.5  HGB 12.3* 11.7*  HCT 36.3* 35.8*  PLT 342 288   BMET Recent Labs    06/02/19 1537 06/03/19 0146  NA 136 130*  K 3.1* 2.9*  CL 93* 91*  CO2 29 24  GLUCOSE 125* 146*  BUN 12 10  CREATININE 1.35* 1.16  CALCIUM 10.7* 9.7   LFT Recent Labs    06/02/19 1537 06/03/19 0146  PROT 7.9 7.3  ALBUMIN 3.1* 3.0*  AST 144* 136*  ALT 147* 129*  ALKPHOS 752* 594*  BILITOT 21.7* 22.0*   PT/INR Recent Labs    06/03/19 0146  LABPROT 12.7  INR 1.0   Hepatitis Panel No results for input(s): HEPBSAG, HCVAB, HEPAIGM, HEPBIGM in the last 72 hours.  Studies/Results: No results found.  ASSESMENT:   *   Pancreatic cancer, obstructive jaundice.  S/p 05/11/19 biliary stent placement, Dr Henrene Pastor.  4/15 EUS w tissue sampling confirming adenocarcinoma Now w early occlusion of stent w recurrent jaundice, pruritus.    Ca 19- 9 of 05/10/19: 2411 >> today: 4261   PLAN   *   ERCP w stent exchange this AM ~ 10:30 w Dr Lyndel Safe.  Pt agreeable    Azucena Freed  06/03/2019, 9:11 AM Phone [phone]

## 2019-06-03 NOTE — Anesthesia Postprocedure Evaluation (Signed)
Anesthesia Post Note  Patient: Isaac Doyle  Procedure(s) Performed: ENDOSCOPIC RETROGRADE CHOLANGIOPANCREATOGRAPHY (ERCP) (N/A ) [place]     Patient location during evaluation: PACU Anesthesia Type: General Level of consciousness: awake and alert Pain management: pain level controlled Vital Signs Assessment: post-procedure vital signs reviewed and stable Respiratory status: spontaneous breathing, nonlabored ventilation and respiratory function stable Cardiovascular status: blood pressure returned to baseline and stable Postop Assessment: no apparent nausea or vomiting Anesthetic complications: no    Last Vitals:  Vitals:   06/03/19 1255 06/03/19 1349  BP: 139/65 122/83  Pulse: (!) 59 68  Resp: 13 18  Temp:    SpO2: 99% 100%    Last Pain:  Vitals:   06/03/19 1255  TempSrc:   PainSc: 0-No pain                 Audry Pili

## 2019-06-03 NOTE — Op Note (Signed)
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 Had previous                            10Fr 7 cm plastic stent 4/7. Now with progressive                            obstructive jaundice. Providers:                Jackquline Denmark, MD, Glori Bickers, RN, Lazaro Arms,                            Technician Referring MD:              Medicines:                General Anesthesia, Cipro 400 mg IV, Indocin 100 mg                            PR x 1 after the procedure. Glucagon 0.2 mg IV Complications:            No immediate complications. Estimated Blood Loss:     Estimated blood loss: none. Procedure:                Pre-Anesthesia Assessment:                           - Prior to the procedure, a History and Physical                            was performed, and patient medications and                            allergies were reviewed. The patient's tolerance of                            previous anesthesia was also reviewed. The risks                            and benefits of the procedure and the sedation                            options and risks were discussed with the patient.                            All questions were answered, and informed consent  was obtained. Prior Anticoagulants: The patient has                            taken no previous anticoagulant or antiplatelet                            agents. ASA Grade Assessment: III - A patient with                            severe systemic disease. After reviewing the risks                            and benefits, the patient was deemed in                            satisfactory condition to  undergo the procedure.                           After obtaining informed consent, the scope was                            passed under direct vision. Throughout the                            procedure, the patient's blood pressure, pulse, and                            oxygen saturations were monitored continuously. The                            [id number] Olympus Duodensocope was                            introduced through the mouth, and used to inject                            contrast into and used to inject contrast into the                            bile duct. The ERCP was performed with difficulty.                            The patient tolerated the procedure well. Scope In: Scope Out: Findings:      A biliary stent was visible on the scout film. The esophagus was       successfully intubated under direct vision. The scope was advanced to a       major papilla in the descending duodenum without detailed examination of       the pharynx, larynx and associated structures, and upper GI tract. The       upper GI tract was grossly normal. Duodenum was deformed (likely d/t       large pancreatic mass) Major papilla was abnormal in appearance with       frond-like surface (see photo documentation).  Previous plastic 10 French 7 cm stent had occluded and migrated       partially into the duodenum. This was removed using a snare. There was       some bleeding thereafter which stopped on its own. The bile duct was       deeply cannulated with the short-nosed traction sphincterotome in long       position. Contrast was injected. I personally interpreted the bile duct       images. Ductal flow of contrast was adequate. Image quality was       adequate. The main bile duct was moderately dilated, secondary to a       distal malignant appearing 2 cm stricture. The largest diameter was 15       mm. The right and the left hepatic ducts were moderately dilated. The       bile  duct and gallbladder didnot opacify      One 10 Fr by 6 cm covered metal stent was placed 5 cm into the common       bile duct. Thick green bile flowed through the stent. The stent was in       good position.      Pancreatic duct was intentionally not cannulated. 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 Recommendation:           - Return patient to hospital ward for ongoing care.                           - Watch for pancreatitis, bleeding, perforation,                            and cholangitis.                           - The findings and recommendations were discussed                            with the patient.                           - Recheck CBC and LFTs in AM.                           - Clear liquid diet today. If no abdominal pain,                            can advance diet to heart healthy diet by dinner                            tonight. Procedure Code(s):        --- Professional ---                           [phone], Endoscopic retrograde  cholangiopancreatography (ERCP); with placement of                            endoscopic stent into biliary or pancreatic duct,                            including pre- and post-dilation and guide wire                            passage, when performed, including sphincterotomy,                            when performed, each stent                           [id number], Endoscopic catheterization of the biliary                            ductal system, radiological supervision and                            interpretation Diagnosis Code(s):        --- Professional ---                           K83.1, Obstruction of bile duct CPT copyright 2019 [employer]. All rights reserved. The codes documented in this report are preliminary and upon coder review may  be revised to meet current compliance requirements. Jackquline Denmark,  MD 06/03/2019 12:27:53 PM This report has been signed electronically. Number of Addenda: 0

## 2019-06-03 NOTE — Anesthesia Procedure Notes (Signed)
Procedure Name: Intubation Date/Time: 06/03/2019 11:12 AM Performed by: Wilburn Cornelia, CRNA Pre-anesthesia Checklist: Patient identified, Emergency Drugs available, Suction available and Patient being monitored Patient Re-evaluated:Patient Re-evaluated prior to induction Oxygen Delivery Method: Circle system utilized Preoxygenation: Pre-oxygenation with 100% oxygen Induction Type: IV induction Ventilation: Mask ventilation without difficulty Laryngoscope Size: Mac and 4 Grade View: Grade I Tube type: Oral Tube size: 7.5 mm Number of attempts: 2 Airway Equipment and Method: Stylet and Oral airway Placement Confirmation: ETT inserted through vocal cords under direct vision,  positive ETCO2 and breath sounds checked- equal and bilateral Secured at: 22 cm Tube secured with: Tape Dental Injury: Teeth and Oropharynx as per pre-operative assessment

## 2019-06-03 NOTE — Anesthesia Preprocedure Evaluation (Addendum)
Anesthesia Evaluation  Patient identified by MRN, date of birth, ID band Patient awake    Reviewed: Allergy & Precautions, NPO status , Patient's Chart, lab work & pertinent test results  Airway Mallampati: II  TM Distance: >3 FB Neck ROM: Full    Dental  (+) Dental Advisory Given, Poor Dentition, Missing, Chipped,    Pulmonary    Pulmonary exam normal  + decreased breath sounds      Cardiovascular hypertension, Pt. on medications  Rhythm:Regular Rate:Normal     Neuro/Psych    GI/Hepatic Malignant biliary obstruction Malignant neoplasm of pancreas  Malignant obstructive jaundice      Endo/Other    Renal/GU ARFRenal disease     Musculoskeletal   Abdominal   Peds  Hematology  (+) Blood dyscrasia, anemia ,   Anesthesia Other Findings   Reproductive/Obstetrics                          Anesthesia Physical  Anesthesia Plan  ASA: III  Anesthesia Plan: General   Post-op Pain Management:    Induction: Intravenous  PONV Risk Score and Plan: 1 and Treatment may vary due to age or medical condition  Airway Management Planned: Oral ETT and LMA  Additional Equipment: None  Intra-op Plan:   Post-operative Plan: Extubation in OR  Informed Consent: I have reviewed the patients History and Physical, chart, labs and discussed the procedure including the risks, benefits and alternatives for the proposed anesthesia with the patient or authorized representative who has indicated his/her understanding and acceptance.     Dental advisory given  Plan Discussed with: CRNA and Anesthesiologist  Anesthesia Plan Comments: (  )        Anesthesia Quick Evaluation

## 2019-06-03 NOTE — Care Management (Addendum)
[place] notification line called.  Notification number [id number]   Josem Kaufmann number [place] [id number]   Patient with [facility] [phone] , called same no answer and no voicemail .   Magdalen Spatz

## 2019-06-03 NOTE — ED Provider Notes (Signed)
[name] EMERGENCY DEPARTMENT Provider Note   CSN: [id number] Arrival date & time: 06/02/19  1759     History Chief Complaint  Patient presents with  . Jaundice    Isaac Doyle is a 64 y.o. male.  The history is provided by the patient.  Illness Location:  Patient with known pancreatic cancer s/r stent placement  Quality:  Presents for itching and worsening jaundice and dark urine Severity:  Severe Onset quality:  Gradual Timing:  Constant Progression:  Worsening Chronicity:  Recurrent Context:  Pancreatic cancer at the head of the pancreas Relieved by:  Nothing  Worsened by:  Nothing  Ineffective treatments:  None tried  Associated symptoms: no abdominal pain, no chest pain, no congestion, no cough, no diarrhea, no ear pain, no fatigue, no fever, no headaches, no loss of consciousness, no myalgias, no nausea, no rash, no rhinorrhea, no shortness of breath, no sore throat, no vomiting and no wheezing   Associated symptoms comment:  Itching  Risk factors:  Pnacreatic cancer       Past Medical History:  Diagnosis Date  . Hypertension     Patient Active Problem List   Diagnosis Date Noted  . Malignant neoplasm of pancreas ([place])   . Biliary stricture   . ARF (acute renal failure) ([place]) 05/10/2019  . Essential hypertension 05/10/2019  . Hypokalemia 05/10/2019  . Normochromic normocytic anemia 05/10/2019  . Malignant biliary obstruction ([place])   . Jaundice 05/09/2019    Past Surgical History:  Procedure Laterality Date  . BILIARY STENT PLACEMENT  05/11/2019   Procedure: BILIARY STENT PLACEMENT;  Surgeon: Irene Shipper, MD;  Location: [facility] ENDOSCOPY;  Service: Endoscopy;;  . ERCP N/A 05/11/2019   Procedure: ENDOSCOPIC RETROGRADE CHOLANGIOPANCREATOGRAPHY (ERCP);  Surgeon: Irene Shipper, MD;  Location: [facility] ENDOSCOPY;  Service: Endoscopy;  Laterality: N/A;  with   . ESOPHAGOGASTRODUODENOSCOPY (EGD) WITH PROPOFOL N/A 05/19/2019   Procedure:  ESOPHAGOGASTRODUODENOSCOPY (EGD) WITH PROPOFOL;  Surgeon: [name], MD;  Location: [facility] ENDOSCOPY;  Service: Endoscopy;  Laterality: N/A;  . EUS N/A 05/19/2019   Procedure: UPPER ENDOSCOPIC ULTRASOUND (EUS) LINEAR;  Surgeon: [name], MD;  Location: [facility] ENDOSCOPY;  Service: Endoscopy;  Laterality: N/A;  . FINE NEEDLE ASPIRATION N/A 05/19/2019   Procedure: FINE NEEDLE ASPIRATION (FNA) LINEAR;  Surgeon: [name], MD;  Location: [facility] ENDOSCOPY;  Service: Endoscopy;  Laterality: N/A;  . SPHINCTEROTOMY  05/11/2019   Procedure: SPHINCTEROTOMY;  Surgeon: Irene Shipper, MD;  Location: [facility] ENDOSCOPY;  Service: Endoscopy;;       Family History  Problem Relation Age of Onset  . Lymphoma Mother     Social History   Tobacco Use  . Smoking status: Never Smoker  . Smokeless tobacco: Never Used  . Tobacco comment: plan to quit completely   Substance Use Topics  . Alcohol use: Yes    Alcohol/week: 22.0 standard drinks    Types: 12 Cans of beer, 10 Shots of liquor per week  . Drug use: Not Currently    Home Medications Prior to Admission medications   Medication Sig Start Date End Date Taking? Authorizing Provider  amLODipine (NORVASC) 10 MG tablet Take 10 mg by mouth daily.   Yes [provider]  atenolol (TENORMIN) 100 MG tablet Take 1 tablet (100 mg total) by mouth daily. 05/14/19  Yes Vann, Jessica U, DO  cetirizine (ZYRTEC) 10 MG tablet Take 10 mg by mouth daily.   Yes [provider]  guaifenesin (HUMIBID  E) 400 MG TABS tablet Take 1 tablet (400 mg total) by mouth every 6 (six) hours as needed. Patient taking differently: Take 400 mg by mouth in the morning and at bedtime.  05/13/19  Yes Vann, Jessica U, DO  ibuprofen (ADVIL) 200 MG tablet Take 200 mg by mouth every 6 (six) hours as needed for headache.   Yes [provider]  magnesium oxide (MAG-OX) 400 (241.3 Mg) MG tablet Take 1 tablet (400 mg total) by mouth daily. 05/14/19  Yes Vann, Jessica U, DO    ondansetron (ZOFRAN) 8 MG tablet Take 1 tablet (8 mg total) by mouth 2 (two) times daily as needed. Start on day 3 after chemotherapy. 05/25/19  Yes Truitt Merle, MD  potassium chloride SA (KLOR-CON) 20 MEQ tablet Take 2 tablets (40 mEq total) by mouth daily. 05/13/19  Yes Geradine Girt, DO  prochlorperazine (COMPAZINE) 10 MG tablet Take 1 tablet (10 mg total) by mouth every 6 (six) hours as needed (Nausea or vomiting). 05/25/19  Yes Truitt Merle, MD  sulindac (CLINORIL) 200 MG tablet Take 400 mg by mouth 2 (two) times daily.   Yes [provider]  traZODone (DESYREL) 100 MG tablet Take 100 mg by mouth at bedtime.   Yes [provider]    Allergies    Patient has no known allergies.  Review of Systems   Review of Systems  Constitutional: Negative for fatigue and fever.  HENT: Negative for congestion, ear pain, rhinorrhea and sore throat.   Eyes: Negative for visual disturbance.  Respiratory: Negative for cough, shortness of breath and wheezing.   Cardiovascular: Negative for chest pain.  Gastrointestinal: Negative for abdominal pain, diarrhea, nausea and vomiting.  Genitourinary: Negative for dysuria.  Musculoskeletal: Negative for myalgias.  Skin: Negative for rash.  Neurological: Negative for loss of consciousness and headaches.  Psychiatric/Behavioral: Negative for agitation.    Physical Exam Updated Vital Signs BP 111/65 (BP Location: Right Arm)   Pulse 68   Temp 97.9 F (36.6 C) (Oral)   Resp 14   Ht 5\' 11"  (1.803 m)   Wt 86 kg   SpO2 100%   BMI 26.44 kg/m   Physical Exam Vitals and nursing note reviewed.  Constitutional:      General: He is not in acute distress.    Appearance: Normal appearance.  HENT:     Head: Normocephalic and atraumatic.     Nose: Nose normal.  Eyes:     Extraocular Movements: Extraocular movements intact.     Pupils: Pupils are equal, round, and reactive to light.  Cardiovascular:     Rate and Rhythm: Normal rate and regular  rhythm.     Pulses: Normal pulses.     Heart sounds: Normal heart sounds.  Pulmonary:     Effort: Pulmonary effort is normal.     Breath sounds: Normal breath sounds.  Abdominal:     General: Abdomen is flat. Bowel sounds are normal.     Tenderness: There is no abdominal tenderness. There is no guarding.  Musculoskeletal:        General: Normal range of motion.     Cervical back: Normal range of motion and neck supple.  Skin:    General: Skin is warm and dry.     Capillary Refill: Capillary refill takes less than 2 seconds.     Comments: Jaundice   Neurological:     General: No focal deficit present.     Mental Status: He is alert and oriented  to person, place, and time.     Deep Tendon Reflexes: Reflexes normal.  Psychiatric:        Mood and Affect: Mood normal.        Behavior: Behavior normal.     ED Results / Procedures / Treatments   Labs (all labs ordered are listed, but only abnormal results are displayed) Results for orders placed or performed during the hospital encounter of 06/02/19  Urinalysis, Routine w reflex microscopic  Result Value Ref Range   Color, Urine AMBER (A) YELLOW   APPearance HAZY (A) CLEAR   Specific Gravity, Urine 1.019 1.005 - 1.030   pH 5.0 5.0 - 8.0   Glucose, UA NEGATIVE NEGATIVE mg/dL   Hgb urine dipstick SMALL (A) NEGATIVE   Bilirubin Urine MODERATE (A) NEGATIVE   Ketones, ur NEGATIVE NEGATIVE mg/dL   Protein, ur 100 (A) NEGATIVE mg/dL   Nitrite NEGATIVE NEGATIVE   Leukocytes,Ua SMALL (A) NEGATIVE   RBC / HPF 0-5 0 - 5 RBC/hpf   WBC, UA 21-50 0 - 5 WBC/hpf   Bacteria, UA NONE SEEN NONE SEEN   Squamous Epithelial / LPF 0-5 0 - 5   Mucus PRESENT    Hyaline Casts, UA PRESENT    Non Squamous Epithelial 0-5 (A) NONE SEEN  CBC with Differential/Platelet  Result Value Ref Range   WBC 6.5 4.0 - 10.5 K/uL   RBC 4.01 (L) 4.22 - 5.81 MIL/uL   Hemoglobin 11.7 (L) 13.0 - 17.0 g/dL   HCT 35.8 (L) 39.0 - 52.0 %   MCV 89.3 80.0 - 100.0 fL    MCH 29.2 26.0 - 34.0 pg   MCHC 32.7 30.0 - 36.0 g/dL   RDW 13.4 11.5 - 15.5 %   Platelets 288 150 - 400 K/uL   nRBC 0.0 0.0 - 0.2 %   Neutrophils Relative % 67 %   Neutro Abs 4.3 1.7 - 7.7 K/uL   Lymphocytes Relative 25 %   Lymphs Abs 1.6 0.7 - 4.0 K/uL   Monocytes Relative 6 %   Monocytes Absolute 0.4 0.1 - 1.0 K/uL   Eosinophils Relative 1 %   Eosinophils Absolute 0.1 0.0 - 0.5 K/uL   Basophils Relative 1 %   Basophils Absolute 0.0 0.0 - 0.1 K/uL   Immature Granulocytes 0 %   Abs Immature Granulocytes 0.01 0.00 - 0.07 K/uL   CT CHEST W CONTRAST  Result Date: 05/13/2019 CLINICAL DATA:  Staging pancreatic neoplasm EXAM: CT CHEST, ABDOMEN, AND PELVIS WITH CONTRAST TECHNIQUE: Multidetector CT imaging of the chest, abdomen and pelvis was performed following the standard protocol during bolus administration of intravenous contrast. CONTRAST:  133mL OMNIPAQUE IOHEXOL 350 MG/ML SOLN COMPARISON:  MRI 05/10/2019 FINDINGS: CT CHEST FINDINGS Cardiovascular: No significant vascular findings. Normal heart size. No pericardial effusion. Mediastinum/Nodes: No enlarged mediastinal, hilar, or axillary lymph nodes. Thyroid gland, trachea, and esophagus demonstrate no significant findings. Calcified mediastinal and hilar lymph nodes compatible with prior granulomatous disease. Lungs/Pleura: No pleural effusion. No suspicious lung nodules Musculoskeletal: No chest wall mass or suspicious bone lesions identified. CT ABDOMEN PELVIS FINDINGS Hepatobiliary: Interval placement of common bile duct stent. There is pneumobilia compatible with biliary patency. No suspicious liver lesion suggestive of metastatic disease. Pancreas: There is a diffusely infiltrative process involving the head, neck, body and tail of pancreas. No main duct dilatation identified. -The portal venous confluence and proximal extrahepatic portal vein are partially encased and narrowed. This is best seen on image 66/11 and image 62/11. -lesion may  touch the right side of the superior mesenteric artery without evidence for encasement, image 64/11. -No convincing evidence for involvement of the celiac trunk. Spleen: Normal in size without focal abnormality. Adrenals/Urinary Tract: Adrenal glands are unremarkable. Left kidney cysts. Kidneys are otherwise normal, without renal calculi, suspicious lesion, or hydronephrosis. Stomach/Bowel: Stomach is within normal limits. Visualized bowel loops are unremarkable. No evidence of bowel wall thickening, inflammation or distension. Vascular/Lymphatic: Mild aortic atherosclerosis. No aneurysm. Portacaval lymph node measures 1.3 cm short axis, image 59/11. Other: No ascites. No peritoneal nodule or mass Musculoskeletal: No acute or significant osseous findings. IMPRESSION: 1. Interval placement of common bile duct stent with decompression of the bile ducts. Pneumobilia is now noted compatible with biliary patency. 2. Diffuse infiltrative process involving the head, neck, body and tail of pancreas is identified. The diffusely infiltrative appearance of the pancreas is somewhat unusual. Although favored to represent pancreatic adenocarcinoma, other etiologies to consider include IgG4-related Sclerosing Disease of the pancreas. 3. There is encasement and narrowing of the portal venous confluence and proximal portal vein. Mild soft tissue stranding extends to but does not encase the superior mesenteric artery. No convincing evidence for involvement of the celiac trunk. 4. Borderline enlarged portacaval node. No convincing evidence for liver metastasis or metastatic disease to the chest. 5. Aortic atherosclerosis. Aortic Atherosclerosis (ICD10-I70.0). Electronically Signed   By: Kerby Moors M.D.   On: 05/13/2019 14:44   MR ABDOMEN MRCP WO CONTRAST  Result Date: 05/10/2019 CLINICAL DATA:  Biliary obstruction. Jaundice. EXAM: MRI ABDOMEN WITHOUT CONTRAST  (INCLUDING MRCP) TECHNIQUE: Multiplanar multisequence MR imaging of  the abdomen was performed. Heavily T2-weighted images of the biliary and pancreatic ducts were obtained, and three-dimensional MRCP images were rendered by post processing. COMPARISON:  Ultrasound 05/09/2019 FINDINGS: Lower chest: The lung bases are grossly clear. No pulmonary lesions, pleural or pericardial effusion. Hepatobiliary: Marked intrahepatic biliary dilatation. No obvious hepatic lesions are identified without contrast. The gallbladder is distended. Small gallstones are noted. There is an abrupt cut off of the common bile duct chest as it enters the region of the pancreatic head. The duct is dilated to 16 mm. No common bile duct stones are identified. Pancreas: Suspect infiltrating pancreatic neoplasm in the pancreatic head obstructing the common bile duct. There is also mild dilatation of the main pancreatic duct and the minor pancreatic duct. Difficult to clearly identify or measure the lesion without contrast. Spleen:  Upper limits of normal in size. No splenic lesions. Adrenals/Urinary Tract: The adrenal glands are unremarkable. Bilateral renal cysts are noted. Stomach/Bowel: The stomach, duodenum, visualized small bowel and visualize colon are grossly normal. Vascular/Lymphatic:  The aorta is normal in caliber. No definite mesenteric or retroperitoneal adenopathy. Other:  No ascites or abdominal wall hernia. Musculoskeletal: No significant bony findings. IMPRESSION: 1. 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 Difficult to identify for certain without contrast or assess the major vascular structures. Patient may require a pancreatic protocol CT scan. Recommend GI consultation for biliary drainage and probable EUS and biopsy. 2. No obvious adenopathy or metastatic disease. 3. Marked intrahepatic and extrahepatic biliary dilatation. 4. Cholelithiasis. 5. Bilateral renal cysts. Electronically Signed   By: Marijo Sanes M.D.   On:  05/10/2019 07:29   US Abdomen Limited  Result Date: 05/09/2019 CLINICAL DATA:  64 year old male with jaundice and elevated LFTs. EXAM: ULTRASOUND ABDOMEN LIMITED RIGHT UPPER QUADRANT COMPARISON:  None. FINDINGS: Gallbladder: The gallbladder is distended. There is sludge and  stones within the gallbladder. No gallbladder wall thickening or pericholecystic fluid. Common bile duct: Diameter: 15 mm. There is dilatation of the common bile duct as well as moderate intrahepatic biliary ductal dilatation. A centrally obstructing stone or mass is not excluded. Further initial evaluation with CT of the abdomen pelvis with IV contrast is recommended. MRCP may provide additional evaluation based on CT findings. Liver: There is mild diffuse increased liver echogenicity most commonly seen in the setting of fatty infiltration. Superimposed inflammation or fibrosis is not excluded. Clinical correlation is recommended. Portal vein is patent on color Doppler imaging with normal direction of blood flow towards the liver. Other: There is a 2.0 x 1.9 x 1.8 cm hypoechoic lesion in the region of the porta pedis and the head of the pancreas. IMPRESSION: Cholelithiasis with findings concerning for central biliary obstruction. Further evaluation with CT with IV contrast is recommended. No definite sonographic findings of acute cholecystitis. Electronically Signed   By: Anner Crete M.D.   On: 05/09/2019 21:15   DG ERCP BILIARY & PANCREATIC DUCTS  Result Date: 05/11/2019 CLINICAL DATA:  64 year old male with biliary obstruction EXAM: ERCP TECHNIQUE: Multiple spot images obtained with the fluoroscopic device and submitted for interpretation post-procedure. FLUOROSCOPY TIME:  Fluoroscopy Time:  6 minutes 57 seconds COMPARISON:  MR 05/10/2019 FINDINGS: Limited intraoperative fluoroscopic spot images performed. Initial image demonstrates endoscope projecting over the upper abdomen. There is subsequently cannulation of the ampulla with  retrograde infusion of contrast. Ossification of the extrahepatic biliary ducts with dilation. Final image demonstrates placement of a plastic biliary stent. IMPRESSION: Limited images during ERCP demonstrates treatment of extrahepatic biliary system with deployment of plastic biliary stent. Please refer to the dictated operative report for full details of intraoperative findings and procedure. Electronically Signed   By: Corrie Mckusick D.O.   On: 05/11/2019 17:07   CT PANCREAS ABD W/WO  Result Date: 05/13/2019 CLINICAL DATA:  Staging pancreatic neoplasm EXAM: CT CHEST, ABDOMEN, AND PELVIS WITH CONTRAST TECHNIQUE: Multidetector CT imaging of the chest, abdomen and pelvis was performed following the standard protocol during bolus administration of intravenous contrast. CONTRAST:  18mL OMNIPAQUE IOHEXOL 350 MG/ML SOLN COMPARISON:  MRI 05/10/2019 FINDINGS: CT CHEST FINDINGS Cardiovascular: No significant vascular findings. Normal heart size. No pericardial effusion. Mediastinum/Nodes: No enlarged mediastinal, hilar, or axillary lymph nodes. Thyroid gland, trachea, and esophagus demonstrate no significant findings. Calcified mediastinal and hilar lymph nodes compatible with prior granulomatous disease. Lungs/Pleura: No pleural effusion. No suspicious lung nodules Musculoskeletal: No chest wall mass or suspicious bone lesions identified. CT ABDOMEN PELVIS FINDINGS Hepatobiliary: Interval placement of common bile duct stent. There is pneumobilia compatible with biliary patency. No suspicious liver lesion suggestive of metastatic disease. Pancreas: There is a diffusely infiltrative process involving the head, neck, body and tail of pancreas. No main duct dilatation identified. -The portal venous confluence and proximal extrahepatic portal vein are partially encased and narrowed. This is best seen on image 66/11 and image 62/11. -lesion may touch the right side of the superior mesenteric artery without evidence for  encasement, image 64/11. -No convincing evidence for involvement of the celiac trunk. Spleen: Normal in size without focal abnormality. Adrenals/Urinary Tract: Adrenal glands are unremarkable. Left kidney cysts. Kidneys are otherwise normal, without renal calculi, suspicious lesion, or hydronephrosis. Stomach/Bowel: Stomach is within normal limits. Visualized bowel loops are unremarkable. No evidence of bowel wall thickening, inflammation or distension. Vascular/Lymphatic: Mild aortic atherosclerosis. No aneurysm. Portacaval lymph node measures 1.3 cm short axis, image 59/11. Other: No  ascites. No peritoneal nodule or mass Musculoskeletal: No acute or significant osseous findings. IMPRESSION: 1. Interval placement of common bile duct stent with decompression of the bile ducts. Pneumobilia is now noted compatible with biliary patency. 2. Diffuse infiltrative process involving the head, neck, body and tail of pancreas is identified. The diffusely infiltrative appearance of the pancreas is somewhat unusual. Although favored to represent pancreatic adenocarcinoma, other etiologies to consider include IgG4-related Sclerosing Disease of the pancreas. 3. There is encasement and narrowing of the portal venous confluence and proximal portal vein. Mild soft tissue stranding extends to but does not encase the superior mesenteric artery. No convincing evidence for involvement of the celiac trunk. 4. Borderline enlarged portacaval node. No convincing evidence for liver metastasis or metastatic disease to the chest. 5. Aortic atherosclerosis. Aortic Atherosclerosis (ICD10-I70.0). Electronically Signed   By: Kerby Moors M.D.   On: 05/13/2019 14:44    Radiology No results found.  Procedures Procedures (including critical care time)  Medications Ordered in ED Medications  hydrOXYzine (ATARAX/VISTARIL) tablet 25 mg (has no administration in time range)    ED Course  I have reviewed the triage vital signs and the  nursing notes.  Pertinent labs & imaging results that were available during my care of the patient were reviewed by me and considered in my medical decision making (see chart for details).    Patient sent in by oncology and GI for worsening hyperbilirubinemia Final Clinical Impression(s) / ED Diagnoses Final diagnoses:  Hyperbilirubinemia   Admit to medicine    Beatrice Ziehm, MD 06/03/19 [id number]

## 2019-06-03 NOTE — H&P (View-Only) (Signed)
          Daily Rounding Note  06/03/2019, 9:11 AM  LOS: 0 days   SUBJECTIVE:   Chief complaint: dark urine and pruritus    Pt developed obstructive jaundice earlier this month and underwent ERCPs with stenting on 4/6.  On 4/15 EUS with cytology confirmed adenocarcinoma. After stenting patient's bilirubin went down from 42.3 to 20.1 on 4/9.  Other LFTs also improved. Over the last couple of days patient has noticed recurrent dark urine and suffering from pruritus.  Bilirubin back to 21.7, alkaline phosphatase 752, AST/ALT 144/147 all of which are elevated.  CA 19-9 also rising from 2411 to current 4261.  WBCs normal.  INR 1, this is improved from 2.4 3 weeks prior COVID-19 negative.  Patient attended chemo class yesterday and after reporting his pruritus and dark urine, LFTs were obtained showing rise.  Dr. Krista Blue spoke with Dr. Ardis Hughs who recommended patient coming to hospital in order to expedite ERCPs and stent exchange. Procedure is set up for around 1030 this morning with Dr. Lyndel Safe.  No fevers, no chills, no nausea, vomiting, no abdominal pain.    OBJECTIVE:         Vital signs in last 24 hours:    Temp:  [97.9 F (36.6 C)-98.4 F (36.9 C)] 97.9 F (36.6 C) (04/29 2128) Pulse Rate:  [47-74] 55 (04/30 0630) Resp:  [14-18] 18 (04/30 0630) BP: (106-135)/(60-77) 135/73 (04/30 0630) SpO2:  [98 %-100 %] 99 % (04/30 0630) Weight:  [86 kg] 86 kg (04/29 1835)   Filed Weights   06/02/19 1835  Weight: 86 kg   General: Thin, pleasant.  Does not look acutely ill.  Scleral icterus. Heart: RRR. Chest: No labored breathing, lungs clear but overall diminished breath sounds. Abdomen: Soft, nontender, nondistended.  Active bowel sounds. Extremities: No CCE. Neuro/Psych: Calm, pleasant, cooperative.  No gross deficits/weakness.  No tremors.  Intake/Output from previous day: No intake/output data recorded.  Intake/Output this shift: No  intake/output data recorded.  Lab Results: Recent Labs    06/02/19 1537 06/03/19 0146  WBC 9.0 6.5  HGB 12.3* 11.7*  HCT 36.3* 35.8*  PLT 342 288   BMET Recent Labs    06/02/19 1537 06/03/19 0146  NA 136 130*  K 3.1* 2.9*  CL 93* 91*  CO2 29 24  GLUCOSE 125* 146*  BUN 12 10  CREATININE 1.35* 1.16  CALCIUM 10.7* 9.7   LFT Recent Labs    06/02/19 1537 06/03/19 0146  PROT 7.9 7.3  ALBUMIN 3.1* 3.0*  AST 144* 136*  ALT 147* 129*  ALKPHOS 752* 594*  BILITOT 21.7* 22.0*   PT/INR Recent Labs    06/03/19 0146  LABPROT 12.7  INR 1.0   Hepatitis Panel No results for input(s): HEPBSAG, HCVAB, HEPAIGM, HEPBIGM in the last 72 hours.  Studies/Results: No results found.  ASSESMENT:   *   Pancreatic cancer, obstructive jaundice.  S/p 05/11/19 biliary stent placement, Dr Henrene Pastor.  4/15 EUS w tissue sampling confirming adenocarcinoma Now w early occlusion of stent w recurrent jaundice, pruritus.    Ca 19- 9 of 05/10/19: 2411 >> today: 4261   PLAN   *   ERCP w stent exchange this AM ~ 10:30 w Dr Lyndel Safe.  Pt agreeable    Azucena Freed  06/03/2019, 9:11 AM Phone [phone]

## 2019-06-03 NOTE — Progress Notes (Signed)
PROGRESS NOTE    Isaac Doyle  [id number] DOB: 07-15-1955 DOA: 06/02/2019 PCP: System, Pcp Not In      Brief Narrative:  Isaac Doyle is a 64 y.o. M with HTN, hx trauma on disability, stage III pancreatic CA newly diagnosed, with recent biliary stent, on irinotecan, oxaliplatin, 5FU who presented with dark urine found to have Bili 21 in oncology clinic.    Case then discussed with GI who recommended stent exchange.      Assessment & Plan:  Pancreatic adenocarcinoma, stage III Biliary stent in place Hyperbilirubinemia On FOLFOXIRI. -Consult GI, appreciate cares -NPO and IVF    Hypertension BP normal -Continue atenolol -Hold amlodipine  Mild anemia, due to chemo  Hypokalemia Mag repleted. -Supplement K               Disposition: Status is: Not inpatient appropriate, will downgrade to OBS  Remains inpatient appropriate because:Ongoing diagnostic testing needed not appropriate for outpatient work up   Dispo: The patient is from: Home              Anticipated d/c is to: Home              Anticipated d/c date is: 1 day              Patient currently is not medically stable to d/c.    Likely home after procedure today.           MDM: The below labs and imaging reports were reviewed and summarized above.  Medication management as above.   DVT prophylaxis: None due to procedure Code Status: INPATIENT Family Communication: Daughter, by phone    Consultants:   GI  Procedures:   Endoscopy planned   Antimicrobials:      Culture data:              Subjective: Feeling well.  Dark urine but no vomiting, abdominal pain, no fever, no confusion.  Objective: Vitals:   06/03/19 0545 06/03/19 0600 06/03/19 0615 06/03/19 0630  BP: 127/69 106/64 124/60 135/73  Pulse: 60 (!) 47 (!) 49 (!) 55  Resp:    18  Temp:      TempSrc:      SpO2: 99% 100% 100% 99%  Weight:      Height:       No intake or output data in the 24  hours ending 06/03/19 1022 Filed Weights   06/02/19 1835  Weight: 86 kg    Examination: General appearance:  adult male, alert and in no acute distress.   HEENT: some icterus, conjunctiva pink, lids and lashes normal. No nasal deformity, discharge, epistaxis.  Lips moist, mostly edentulous, OP moist, some sublingual yellow, hearing normal, no oral lesions.   Skin: Warm and dry.  Mild jaundice.  No suspicious rashes or lesions. Cardiac: RRR, nl S1-S2, no murmurs appreciated.  Capillary refill is brisk.  JVP normal.  No LE edema.  Radial  pulses 2+ and symmetric. Respiratory: Normal respiratory rate and rhythm.  CTAB without rales or wheezes. Abdomen: Abdomen soft.  No TTP orugarding. No ascites, distension, hepatosplenomegaly.   MSK: No deformities or effusions. Neuro: Awake and alert.  EOMI, moves all extremities. Speech fluent.    Psych: Sensorium intact and responding to questions, attention normal. Affect normal.  Judgment and insight appear normal.    Data Reviewed: I have personally reviewed following labs and imaging studies:  CBC: Recent Labs  Lab 06/02/19 1537 06/03/19 0146  WBC 9.0 6.5  NEUTROABS 6.2 4.3  HGB 12.3* 11.7*  HCT 36.3* 35.8*  MCV 88.1 89.3  PLT 342 [id number]   Basic Metabolic Panel: Recent Labs  Lab 06/02/19 1537 06/03/19 0146  NA 136 130*  K 3.1* 2.9*  CL 93* 91*  CO2 29 24  GLUCOSE 125* 146*  BUN 12 10  CREATININE 1.35* 1.16  CALCIUM 10.7* 9.7  MG  --  1.7   GFR: Estimated Creatinine Clearance: 67.6 mL/min (by C-G formula based on SCr of 1.16 mg/dL). Liver Function Tests: Recent Labs  Lab 06/02/19 1537 06/03/19 0146  AST 144* 136*  ALT 147* 129*  ALKPHOS 752* 594*  BILITOT 21.7* 22.0*  PROT 7.9 7.3  ALBUMIN 3.1* 3.0*   Recent Labs  Lab 06/03/19 0146  LIPASE 17   No results for input(s): AMMONIA in the last 168 hours. Coagulation Profile: Recent Labs  Lab 06/03/19 0146  INR 1.0   Cardiac Enzymes: No results for input(s):  CKTOTAL, CKMB, CKMBINDEX, TROPONINI in the last 168 hours. BNP (last 3 results) No results for input(s): PROBNP in the last 8760 hours. HbA1C: No results for input(s): HGBA1C in the last 72 hours. CBG: No results for input(s): GLUCAP in the last 168 hours. Lipid Profile: No results for input(s): CHOL, HDL, LDLCALC, TRIG, CHOLHDL, LDLDIRECT in the last 72 hours. Thyroid Function Tests: No results for input(s): TSH, T4TOTAL, FREET4, T3FREE, THYROIDAB in the last 72 hours. Anemia Panel: Recent Labs    06/02/19 1537 06/02/19 1538  VITAMINB12 495  --   FERRITIN  --  4,690*  TIBC  --  281  IRON  --  42  RETICCTPCT  --  2.6   Urine analysis:    Component Value Date/Time   COLORURINE AMBER (A) 06/02/2019 2017   APPEARANCEUR HAZY (A) 06/02/2019 2017   LABSPEC 1.019 06/02/2019 2017   PHURINE 5.0 06/02/2019 2017   GLUCOSEU NEGATIVE 06/02/2019 2017   HGBUR SMALL (A) 06/02/2019 2017   BILIRUBINUR MODERATE (A) 06/02/2019 2017   KETONESUR NEGATIVE 06/02/2019 2017   PROTEINUR 100 (A) 06/02/2019 2017   NITRITE NEGATIVE 06/02/2019 2017   LEUKOCYTESUR SMALL (A) 06/02/2019 2017   Sepsis Labs: @LABRCNTIP (procalcitonin:4,lacticacidven:4)  ) Recent Results (from the past 240 hour(s))  Respiratory Panel by RT PCR (Flu A&B, Covid) - Nasopharyngeal Swab     Status: None   Collection Time: 06/03/19  2:00 AM   Specimen: Nasopharyngeal Swab  Result Value Ref Range Status   SARS Coronavirus 2 by RT PCR NEGATIVE NEGATIVE Final    Comment: (NOTE) SARS-CoV-2 target nucleic acids are NOT DETECTED. The SARS-CoV-2 RNA is generally detectable in upper respiratoy specimens during the acute phase of infection. The lowest concentration of SARS-CoV-2 viral copies this assay can detect is 131 copies/mL. A negative result does not preclude SARS-Cov-2 infection and should not be used as the sole basis for treatment or other patient management decisions. A negative result may occur with  improper specimen  collection/handling, submission of specimen other than nasopharyngeal swab, presence of viral mutation(s) within the areas targeted by this assay, and inadequate number of viral copies (<131 copies/mL). A negative result must be combined with clinical observations, patient history, and epidemiological information. The expected result is Negative. Fact Sheet for Patients:  PinkCheek.be Fact Sheet for Healthcare Providers:  GravelBags.it This test is not yet ap proved or cleared by the [place] FDA and  has been authorized for detection and/or diagnosis of SARS-CoV-2 by FDA under an Emergency Use Authorization (EUA). This EUA will  remain  in effect (meaning this test can be used) for the duration of the COVID-19 declaration under Section 564(b)(1) of the Act, 21 U.S.C. section 360bbb-3(b)(1), unless the authorization is terminated or revoked sooner.    Influenza A by PCR NEGATIVE NEGATIVE Final   Influenza B by PCR NEGATIVE NEGATIVE Final    Comment: (NOTE) The Xpert Xpress SARS-CoV-2/FLU/RSV assay is intended as an aid in  the diagnosis of influenza from Nasopharyngeal swab specimens and  should not be used as a sole basis for treatment. Nasal washings and  aspirates are unacceptable for Xpert Xpress SARS-CoV-2/FLU/RSV  testing. Fact Sheet for Patients: PinkCheek.be Fact Sheet for Healthcare Providers: GravelBags.it This test is not yet approved or cleared by the [place] FDA and  has been authorized for detection and/or diagnosis of SARS-CoV-2 by  FDA under an Emergency Use Authorization (EUA). This EUA will remain  in effect (meaning this test can be used) for the duration of the  Covid-19 declaration under Section 564(b)(1) of the Act, 21  U.S.C. section 360bbb-3(b)(1), unless the authorization is  terminated or revoked. Performed at [facility], [place] [address] [place], [place] [address]          Radiology Studies: No results found.      Scheduled Meds: . [START ON 06/04/2019] atenolol  25 mg Oral Daily  . hydrOXYzine  25 mg Oral Once  . magnesium oxide  400 mg Oral Daily  . potassium chloride SA  40 mEq Oral Daily  . traZODone  100 mg Oral QHS   Continuous Infusions: . 0.9 % NaCl with KCl 40 mEq / L 125 mL/hr (06/03/19 0922)     LOS: 0 days    Time spent: 25 minutes    Edwin Dada, MD [facility] 06/03/2019, 10:22 AM     Please page though [place] or Epic secure chat:  For [employer], Adult nurse

## 2019-06-03 NOTE — ED Notes (Signed)
Assumed care on patient , denies pain at this time, respirations unlabored , IV site intact , waiting for in-patient bed assignment .

## 2019-06-03 NOTE — Interval H&P Note (Signed)
History and Physical Interval Note:  06/03/2019 11:01 AM  Isaac Doyle  has presented today for surgery, with the diagnosis of Biliary obstruction.  The various methods of treatment have been discussed with the patient and family. After consideration of risks, benefits and other options for treatment, the patient has consented to  Procedure(s): ENDOSCOPIC RETROGRADE CHOLANGIOPANCREATOGRAPHY (ERCP) (N/A) as a surgical intervention.  The patient's history has been reviewed, patient examined, no change in status, stable for surgery.  I have reviewed the patient's chart and labs.  Questions were answered to the patient's satisfaction.     Jackquline Denmark

## 2019-06-03 NOTE — Progress Notes (Addendum)
HEMATOLOGY-ONCOLOGY PROGRESS NOTE  SUBJECTIVE: Underwent ERCP with stent exchange earlier today.  Tolerated procedure well.  He is currently taking clear liquids without any difficulty.  He denies abdominal pain, nausea, vomiting.  He has noticed some darkening of his urine and yellowing of his eyes.  Oncology History Overview Note  Cancer Staging Malignant neoplasm of pancreas [facility]) Staging form: Exocrine Pancreas, AJCC 8th Edition - Clinical stage from 05/19/2019: Stage III (cT4, cN1, cM0) - Signed by Truitt Merle, MD on 05/24/2019    Malignant neoplasm of pancreas ([place])  05/10/2019 Tumor Marker   Baseline  CEA at 31.1 Ca 19-9 at 2411   05/11/2019 Procedure   ERCP by Dr Henrene Pastor 05/11/19  IMPRESSION 1. Malignant appearing distal bile duct stricture with upstream dilation. Status post ERCP with sphincterotomy and biliary stent placement   05/13/2019 Imaging   CT Chest and Pancreas 05/13/19 IMPRESSION: 1. Interval placement of common bile duct stent with decompression of the bile ducts. Pneumobilia is now noted compatible with biliary patency. 2. Diffuse infiltrative process involving the head, neck, body and tail of pancreas is identified. The diffusely infiltrative appearance of the pancreas is somewhat unusual. Although favored to represent pancreatic adenocarcinoma, other etiologies to consider include IgG4-related Sclerosing Disease of the pancreas. 3. There is encasement and narrowing of the portal venous confluence and proximal portal vein. Mild soft tissue stranding extends to but does not encase the superior mesenteric artery. No convincing evidence for involvement of the celiac trunk. 4. Borderline enlarged portacaval node. No convincing evidence for liver metastasis or metastatic disease to the chest. 5. Aortic atherosclerosis. Aortic Atherosclerosis (ICD10-I70.0).   05/19/2019 Initial Diagnosis   Malignant neoplasm of pancreas ([place])   05/19/2019 Cancer Staging   Staging form:  Exocrine Pancreas, AJCC 8th Edition - Clinical stage from 05/19/2019: Stage III (cT4, cN1, cM0) - Signed by Truitt Merle, MD on 05/24/2019   05/19/2019 Procedure   EUS by Dr Ardis Hughs 05/19/19 - Large mass involving much of the pancreatic parenchyma, clear encasing the PV and possibly involving the SMA as well. The mass was sampled with 3 transduodenal EUS FNB passes and the preliminary cytology was positive for malignancy (adenocarcinoma). - Previously placed plastic biliary stent was in the CBD in good position.    05/19/2019 Initial Biopsy   A. PANCREAS, HEAD, FINE NEEDLE ASPIRATION:  Cytology  FINAL MICROSCOPIC DIAGNOSIS:  - Malignant cells consistent with adenocarcinoma    06/08/2019 -  Chemotherapy   The patient had palonosetron (ALOXI) injection 0.25 mg, 0.25 mg, Intravenous,  Once, 0 of 4 cycles irinotecan (CAMPTOSAR) 320 mg in sodium chloride 0.9 % 500 mL chemo infusion, 150 mg/m2 = 320 mg, Intravenous,  Once, 0 of 4 cycles oxaliplatin (ELOXATIN) 145 mg in dextrose 5 % 500 mL chemo infusion, 70 mg/m2 = 145 mg (100 % of original dose 70 mg/m2), Intravenous,  Once, 0 of 4 cycles Dose modification: 70 mg/m2 (original dose 70 mg/m2, Cycle 1, Reason: Provider Judgment) fosaprepitant (EMEND) 150 mg in sodium chloride 0.9 % 145 mL IVPB, 150 mg, Intravenous,  Once, 0 of 4 cycles fluorouracil (ADRUCIL) 4,950 mg in sodium chloride 0.9 % 51 mL chemo infusion, 2,400 mg/m2 = 4,950 mg, Intravenous, 1 Day/Dose, 0 of 4 cycles leucovorin 828 mg in sodium chloride 0.9 % 250 mL infusion, 400 mg/m2 = 828 mg, Intravenous,  Once, 0 of 4 cycles  for chemotherapy treatment.       REVIEW OF SYSTEMS:   Constitutional: Denies fevers, chills Eyes: The patient notices yellowing  of his eyes. Ears, nose, mouth, throat, and face: Denies mucositis or sore throat Respiratory: Denies cough, dyspnea or wheezes Cardiovascular: Denies palpitation, chest discomfort Gastrointestinal:  Denies nausea, heartburn or change in  bowel habits Skin: Denies abnormal skin rashes Lymphatics: Denies new lymphadenopathy or easy bruising Neurological:Denies numbness, tingling or new weaknesses Behavioral/Psych: Mood is stable, no new changes  Extremities: No lower extremity edema All other systems were reviewed with the patient and are negative.  I have reviewed the past medical history, past surgical history, social history and family history with the patient and they are unchanged from previous note.   PHYSICAL EXAMINATION: ECOG PERFORMANCE STATUS: 1 - Symptomatic but completely ambulatory  Vitals:   06/03/19 1255 06/03/19 1349  BP: 139/65 122/83  Pulse: (!) 59 68  Resp: 13 18  Temp:    SpO2: 99% 100%   Filed Weights   06/02/19 1835  Weight: 86 kg    Intake/Output from previous day: No intake/output data recorded.  GENERAL:alert, no distress and comfortable EYES: Scleral icterus noted OROPHARYNX:no exudate, no erythema and lips, buccal mucosa, and tongue normal  NECK: supple, thyroid normal size, non-tender, without nodularity LYMPH:  no palpable lymphadenopathy in the cervical, axillary or inguinal LUNGS: clear to auscultation and percussion with normal breathing effort HEART: regular rate & rhythm and no murmurs and no lower extremity edema ABDOMEN:abdomen soft, non-tender and normal bowel sounds Musculoskeletal:no cyanosis of digits and no clubbing  NEURO: alert & oriented x 3 with fluent speech, no focal motor/sensory deficits  LABORATORY DATA:  I have reviewed the data as listed CMP Latest Ref Rng & Units 06/03/2019 06/02/2019 05/19/2019  Glucose 70 - 99 mg/dL 146(H) 125(H) 105(H)  BUN 8 - 23 mg/dL 10 12 18   Creatinine 0.61 - 1.24 mg/dL 1.16 1.35(H) 1.35(H)  Sodium 135 - 145 mmol/L 130(L) 136 133(L)  Potassium 3.5 - 5.1 mmol/L 2.9(L) 3.1(L) 3.2(L)  Chloride 98 - 111 mmol/L 91(L) 93(L) 96(L)  CO2 22 - 32 mmol/L 24 29 24   Calcium 8.9 - 10.3 mg/dL 9.7 10.7(H) 9.3  Total Protein 6.5 - 8.1 g/dL 7.3  7.9 7.6  Total Bilirubin 0.3 - 1.2 mg/dL 22.0(HH) 21.7(HH) 15.1(H)  Alkaline Phos 38 - 126 U/L 594(H) 752(H) 288(H)  AST 15 - 41 U/L 136(H) 144(H) 95(H)  ALT 0 - 44 U/L 129(H) 147(H) 111(H)    Lab Results  Component Value Date   WBC 6.5 06/03/2019   HGB 11.7 (L) 06/03/2019   HCT 35.8 (L) 06/03/2019   MCV 89.3 06/03/2019   PLT 288 06/03/2019   NEUTROABS 4.3 06/03/2019    CT CHEST W CONTRAST  Result Date: 05/13/2019 CLINICAL DATA:  Staging pancreatic neoplasm EXAM: CT CHEST, ABDOMEN, AND PELVIS WITH CONTRAST TECHNIQUE: Multidetector CT imaging of the chest, abdomen and pelvis was performed following the standard protocol during bolus administration of intravenous contrast. CONTRAST:  157mL OMNIPAQUE IOHEXOL 350 MG/ML SOLN COMPARISON:  MRI 05/10/2019 FINDINGS: CT CHEST FINDINGS Cardiovascular: No significant vascular findings. Normal heart size. No pericardial effusion. Mediastinum/Nodes: No enlarged mediastinal, hilar, or axillary lymph nodes. Thyroid gland, trachea, and esophagus demonstrate no significant findings. Calcified mediastinal and hilar lymph nodes compatible with prior granulomatous disease. Lungs/Pleura: No pleural effusion. No suspicious lung nodules Musculoskeletal: No chest wall mass or suspicious bone lesions identified. CT ABDOMEN PELVIS FINDINGS Hepatobiliary: Interval placement of common bile duct stent. There is pneumobilia compatible with biliary patency. No suspicious liver lesion suggestive of metastatic disease. Pancreas: There is a diffusely infiltrative process involving the  head, neck, body and tail of pancreas. No main duct dilatation identified. -The portal venous confluence and proximal extrahepatic portal vein are partially encased and narrowed. This is best seen on image 66/11 and image 62/11. -lesion may touch the right side of the superior mesenteric artery without evidence for encasement, image 64/11. -No convincing evidence for involvement of the celiac trunk.  Spleen: Normal in size without focal abnormality. Adrenals/Urinary Tract: Adrenal glands are unremarkable. Left kidney cysts. Kidneys are otherwise normal, without renal calculi, suspicious lesion, or hydronephrosis. Stomach/Bowel: Stomach is within normal limits. Visualized bowel loops are unremarkable. No evidence of bowel wall thickening, inflammation or distension. Vascular/Lymphatic: Mild aortic atherosclerosis. No aneurysm. Portacaval lymph node measures 1.3 cm short axis, image 59/11. Other: No ascites. No peritoneal nodule or mass Musculoskeletal: No acute or significant osseous findings. IMPRESSION: 1. Interval placement of common bile duct stent with decompression of the bile ducts. Pneumobilia is now noted compatible with biliary patency. 2. Diffuse infiltrative process involving the head, neck, body and tail of pancreas is identified. The diffusely infiltrative appearance of the pancreas is somewhat unusual. Although favored to represent pancreatic adenocarcinoma, other etiologies to consider include IgG4-related Sclerosing Disease of the pancreas. 3. There is encasement and narrowing of the portal venous confluence and proximal portal vein. Mild soft tissue stranding extends to but does not encase the superior mesenteric artery. No convincing evidence for involvement of the celiac trunk. 4. Borderline enlarged portacaval node. No convincing evidence for liver metastasis or metastatic disease to the chest. 5. Aortic atherosclerosis. Aortic Atherosclerosis (ICD10-I70.0). Electronically Signed   By: Kerby Moors M.D.   On: 05/13/2019 14:44   MR ABDOMEN MRCP WO CONTRAST  Result Date: 05/10/2019 CLINICAL DATA:  Biliary obstruction. Jaundice. EXAM: MRI ABDOMEN WITHOUT CONTRAST  (INCLUDING MRCP) TECHNIQUE: Multiplanar multisequence MR imaging of the abdomen was performed. Heavily T2-weighted images of the biliary and pancreatic ducts were obtained, and three-dimensional MRCP images were rendered by post  processing. COMPARISON:  Ultrasound 05/09/2019 FINDINGS: Lower chest: The lung bases are grossly clear. No pulmonary lesions, pleural or pericardial effusion. Hepatobiliary: Marked intrahepatic biliary dilatation. No obvious hepatic lesions are identified without contrast. The gallbladder is distended. Small gallstones are noted. There is an abrupt cut off of the common bile duct chest as it enters the region of the pancreatic head. The duct is dilated to 16 mm. No common bile duct stones are identified. Pancreas: Suspect infiltrating pancreatic neoplasm in the pancreatic head obstructing the common bile duct. There is also mild dilatation of the main pancreatic duct and the minor pancreatic duct. Difficult to clearly identify or measure the lesion without contrast. Spleen:  Upper limits of normal in size. No splenic lesions. Adrenals/Urinary Tract: The adrenal glands are unremarkable. Bilateral renal cysts are noted. Stomach/Bowel: The stomach, duodenum, visualized small bowel and visualize colon are grossly normal. Vascular/Lymphatic:  The aorta is normal in caliber. No definite mesenteric or retroperitoneal adenopathy. Other:  No ascites or abdominal wall hernia. Musculoskeletal: No significant bony findings. IMPRESSION: 1. 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 Difficult to identify for certain without contrast or assess the major vascular structures. Patient may require a pancreatic protocol CT scan. Recommend GI consultation for biliary drainage and probable EUS and biopsy. 2. No obvious adenopathy or metastatic disease. 3. Marked intrahepatic and extrahepatic biliary dilatation. 4. Cholelithiasis. 5. Bilateral renal cysts. Electronically Signed   By: Marijo Sanes M.D.   On: 05/10/2019 07:29  US Abdomen Limited  Result Date: 05/09/2019 CLINICAL DATA:  63 year old male with jaundice and elevated LFTs. EXAM: ULTRASOUND ABDOMEN LIMITED  RIGHT UPPER QUADRANT COMPARISON:  None. FINDINGS: Gallbladder: The gallbladder is distended. There is sludge and stones within the gallbladder. No gallbladder wall thickening or pericholecystic fluid. Common bile duct: Diameter: 15 mm. There is dilatation of the common bile duct as well as moderate intrahepatic biliary ductal dilatation. A centrally obstructing stone or mass is not excluded. Further initial evaluation with CT of the abdomen pelvis with IV contrast is recommended. MRCP may provide additional evaluation based on CT findings. Liver: There is mild diffuse increased liver echogenicity most commonly seen in the setting of fatty infiltration. Superimposed inflammation or fibrosis is not excluded. Clinical correlation is recommended. Portal vein is patent on color Doppler imaging with normal direction of blood flow towards the liver. Other: There is a 2.0 x 1.9 x 1.8 cm hypoechoic lesion in the region of the porta pedis and the head of the pancreas. IMPRESSION: Cholelithiasis with findings concerning for central biliary obstruction. Further evaluation with CT with IV contrast is recommended. No definite sonographic findings of acute cholecystitis. Electronically Signed   By: Anner Crete M.D.   On: 05/09/2019 21:15   DG ERCP  Result Date: 06/03/2019 CLINICAL DATA:  64 year old male with a history of biliary stricture EXAM: ERCP TECHNIQUE: Multiple spot images obtained with the fluoroscopic device and submitted for interpretation post-procedure. FLUOROSCOPY TIME:  Fluoroscopy Time:  2 minutes 31 seconds COMPARISON:  None. FINDINGS: Limited intraoperative fluoroscopic spot images of ERCP. Initial image demonstrates the endoscope projecting over the upper abdomen with a plastic biliary stent in position. Subsequently there has been removal of the stent placement of a safety wire and partial opacification of the extrahepatic biliary ducts. Final image demonstrates placement of a metallic biliary stent  peer IMPRESSION: Limited images of ERCP demonstrates removal of a plastic biliary stent and placement of a metallic biliary stent of the common bile duct. Please refer to the dictated operative report for full details of intraoperative findings and procedure. Electronically Signed   By: Corrie Mckusick D.O.   On: 06/03/2019 12:31   DG ERCP BILIARY & PANCREATIC DUCTS  Result Date: 05/11/2019 CLINICAL DATA:  64 year old male with biliary obstruction EXAM: ERCP TECHNIQUE: Multiple spot images obtained with the fluoroscopic device and submitted for interpretation post-procedure. FLUOROSCOPY TIME:  Fluoroscopy Time:  6 minutes 57 seconds COMPARISON:  MR 05/10/2019 FINDINGS: Limited intraoperative fluoroscopic spot images performed. Initial image demonstrates endoscope projecting over the upper abdomen. There is subsequently cannulation of the ampulla with retrograde infusion of contrast. Ossification of the extrahepatic biliary ducts with dilation. Final image demonstrates placement of a plastic biliary stent. IMPRESSION: Limited images during ERCP demonstrates treatment of extrahepatic biliary system with deployment of plastic biliary stent. Please refer to the dictated operative report for full details of intraoperative findings and procedure. Electronically Signed   By: Corrie Mckusick D.O.   On: 05/11/2019 17:07   CT PANCREAS ABD W/WO  Result Date: 05/13/2019 CLINICAL DATA:  Staging pancreatic neoplasm EXAM: CT CHEST, ABDOMEN, AND PELVIS WITH CONTRAST TECHNIQUE: Multidetector CT imaging of the chest, abdomen and pelvis was performed following the standard protocol during bolus administration of intravenous contrast. CONTRAST:  130mL OMNIPAQUE IOHEXOL 350 MG/ML SOLN COMPARISON:  MRI 05/10/2019 FINDINGS: CT CHEST FINDINGS Cardiovascular: No significant vascular findings. Normal heart size. No pericardial effusion. Mediastinum/Nodes: No enlarged mediastinal, hilar, or axillary lymph nodes. Thyroid gland, trachea, and  esophagus demonstrate  no significant findings. Calcified mediastinal and hilar lymph nodes compatible with prior granulomatous disease. Lungs/Pleura: No pleural effusion. No suspicious lung nodules Musculoskeletal: No chest wall mass or suspicious bone lesions identified. CT ABDOMEN PELVIS FINDINGS Hepatobiliary: Interval placement of common bile duct stent. There is pneumobilia compatible with biliary patency. No suspicious liver lesion suggestive of metastatic disease. Pancreas: There is a diffusely infiltrative process involving the head, neck, body and tail of pancreas. No main duct dilatation identified. -The portal venous confluence and proximal extrahepatic portal vein are partially encased and narrowed. This is best seen on image 66/11 and image 62/11. -lesion may touch the right side of the superior mesenteric artery without evidence for encasement, image 64/11. -No convincing evidence for involvement of the celiac trunk. Spleen: Normal in size without focal abnormality. Adrenals/Urinary Tract: Adrenal glands are unremarkable. Left kidney cysts. Kidneys are otherwise normal, without renal calculi, suspicious lesion, or hydronephrosis. Stomach/Bowel: Stomach is within normal limits. Visualized bowel loops are unremarkable. No evidence of bowel wall thickening, inflammation or distension. Vascular/Lymphatic: Mild aortic atherosclerosis. No aneurysm. Portacaval lymph node measures 1.3 cm short axis, image 59/11. Other: No ascites. No peritoneal nodule or mass Musculoskeletal: No acute or significant osseous findings. IMPRESSION: 1. Interval placement of common bile duct stent with decompression of the bile ducts. Pneumobilia is now noted compatible with biliary patency. 2. Diffuse infiltrative process involving the head, neck, body and tail of pancreas is identified. The diffusely infiltrative appearance of the pancreas is somewhat unusual. Although favored to represent pancreatic adenocarcinoma, other  etiologies to consider include IgG4-related Sclerosing Disease of the pancreas. 3. There is encasement and narrowing of the portal venous confluence and proximal portal vein. Mild soft tissue stranding extends to but does not encase the superior mesenteric artery. No convincing evidence for involvement of the celiac trunk. 4. Borderline enlarged portacaval node. No convincing evidence for liver metastasis or metastatic disease to the chest. 5. Aortic atherosclerosis. Aortic Atherosclerosis (ICD10-I70.0). Electronically Signed   By: Kerby Moors M.D.   On: 05/13/2019 14:44    ASSESSMENT AND PLAN: 1.  Malignant biliary obstruction 2.  Pancreatic adenocarcinoma, cT4 N1 M0, stage III 3.  Hypertension 4.  Mild anemia 5.  Hypokalemia  -The patient has significantly elevated LFTs and total bilirubin.  Underwent ERCP earlier today with stent exchange.  Repeat labs in the morning. -The patient is scheduled to begin neoadjuvant chemotherapy next week.  We will keep this appointment as scheduled. -The patient has mild anemia likely due to his underlying malignancy.  We will monitor this closely. -Replete potassium per hospitalist.   LOS: 0 days   Mikey Bussing, DNP, AGPCNP-BC, AOCNP 06/03/19  Addendum  I have seen the patient, examined him. I agree with the assessment and and plan and have edited the notes.   Pt underwent biliary stent exchange by Dr. Lyndel Safe today. PORT was not done this morning. I will schedule it after his discharge. We discussed that his chemo will likely be slightly postponed due to his recurrent hyperbilirubinemia, I will f/u his lab next week in my office. I answered all his questions. I anticipate he will be discharge him soon.   Truitt Merle 06/03/2019

## 2019-06-03 NOTE — H&P (Signed)
History and Physical    Isaac Doyle BDZ:[id number] DOB: 12-11-1955 DOA: 06/02/2019  PCP: System, Pcp Not In  Patient coming from: Home  I have personally briefly reviewed patient's old medical records in [place]  Chief Complaint: Jaundice  HPI: Isaac Doyle is a 64 y.o. male with medical history significant of HTN.  Pt recently diagnosed with pancreatic cancer and obstructive jaundice earlier this month.  Had biliary stent placed.  Jaundice was improving and Tbili had trended down from 48.4 on 4/5 with an ALK of 668, down to 15.1 by 4/15 with ALK 288.  Repeat imaging showed improvement of biliary ductal dilation with stent placement as of 4/9.  Today patient was at chemo class, and reported dark urine again since yesterday.  Denies fever, chills, or other new symptoms.  Lab was obtained after his chemo class, which showed total bilirubin 21.7.  Dr Krista Blue therefore contacted Dr. Ardis Hughs who recommended hospital admission for stent exchange.  Therefore patient went to ED.   ED Course: ALK of 752 and T.Bili 21.7.  AST and ALT in the 140 range.  K 3.1.   Review of Systems: As per HPI, otherwise all review of systems negative.  Past Medical History:  Diagnosis Date  . Hypertension     Past Surgical History:  Procedure Laterality Date  . BILIARY STENT PLACEMENT  05/11/2019   Procedure: BILIARY STENT PLACEMENT;  Surgeon: Irene Shipper, MD;  Location: [facility] ENDOSCOPY;  Service: Endoscopy;;  . ERCP N/A 05/11/2019   Procedure: ENDOSCOPIC RETROGRADE CHOLANGIOPANCREATOGRAPHY (ERCP);  Surgeon: Irene Shipper, MD;  Location: [facility] ENDOSCOPY;  Service: Endoscopy;  Laterality: N/A;  with   . ESOPHAGOGASTRODUODENOSCOPY (EGD) WITH PROPOFOL N/A 05/19/2019   Procedure: ESOPHAGOGASTRODUODENOSCOPY (EGD) WITH PROPOFOL;  Surgeon: [name], MD;  Location: [facility] ENDOSCOPY;  Service: Endoscopy;  Laterality: N/A;  . EUS N/A 05/19/2019   Procedure: UPPER ENDOSCOPIC ULTRASOUND (EUS) LINEAR;  Surgeon:  [name], MD;  Location: [facility] ENDOSCOPY;  Service: Endoscopy;  Laterality: N/A;  . FINE NEEDLE ASPIRATION N/A 05/19/2019   Procedure: FINE NEEDLE ASPIRATION (FNA) LINEAR;  Surgeon: [name], MD;  Location: [facility] ENDOSCOPY;  Service: Endoscopy;  Laterality: N/A;  . SPHINCTEROTOMY  05/11/2019   Procedure: SPHINCTEROTOMY;  Surgeon: Irene Shipper, MD;  Location: [facility] ENDOSCOPY;  Service: Endoscopy;;     reports that he has never smoked. He has never used smokeless tobacco. He reports current alcohol use of about 22.0 standard drinks of alcohol per week. He reports previous drug use.  No Known Allergies  Family History  Problem Relation Age of Onset  . Lymphoma Mother      Prior to Admission medications   Medication Sig Start Date End Date Taking? Authorizing Provider  amLODipine (NORVASC) 10 MG tablet Take 10 mg by mouth daily.   Yes [provider]  atenolol (TENORMIN) 100 MG tablet Take 1 tablet (100 mg total) by mouth daily. 05/14/19  Yes Vann, Jessica U, DO  cetirizine (ZYRTEC) 10 MG tablet Take 10 mg by mouth daily.   Yes [provider]  guaifenesin (HUMIBID E) 400 MG TABS tablet Take 1 tablet (400 mg total) by mouth every 6 (six) hours as needed. Patient taking differently: Take 400 mg by mouth in the morning and at bedtime.  05/13/19  Yes Vann, Jessica U, DO  ibuprofen (ADVIL) 200 MG tablet Take 200 mg by mouth every 6 (six) hours as needed for headache.   Yes [provider]  magnesium oxide (MAG-OX) 400 (241.3 Mg) MG tablet Take 1 tablet (400 mg total) by mouth daily. 05/14/19  Yes Vann, Jessica U, DO  ondansetron (ZOFRAN) 8 MG tablet Take 1 tablet (8 mg total) by mouth 2 (two) times daily as needed. Start on day 3 after chemotherapy. 05/25/19  Yes Truitt Merle, MD  potassium chloride SA (KLOR-CON) 20 MEQ tablet Take 2 tablets (40 mEq total) by mouth daily. 05/13/19  Yes Geradine Girt, DO  prochlorperazine (COMPAZINE) 10 MG tablet Take 1 tablet (10 mg total)  by mouth every 6 (six) hours as needed (Nausea or vomiting). 05/25/19  Yes Truitt Merle, MD  sulindac (CLINORIL) 200 MG tablet Take 400 mg by mouth 2 (two) times daily.   Yes [provider]  traZODone (DESYREL) 100 MG tablet Take 100 mg by mouth at bedtime.   Yes [provider]    Physical Exam: Vitals:   06/02/19 1835 06/02/19 2128  BP: 126/77 111/65  Pulse: 74 68  Resp: 16 14  Temp: 98.4 F (36.9 C) 97.9 F (36.6 C)  TempSrc: Oral Oral  SpO2: 98% 100%  Weight: 86 kg   Height: '5\' 11"'  (1.803 m)     Constitutional: NAD, calm, comfortable Eyes: PERRL, lids and conjunctivae normal ENMT: Mucous membranes are moist. Posterior pharynx clear of any exudate or lesions.Normal dentition.  Neck: normal, supple, no masses, no thyromegaly Respiratory: clear to auscultation bilaterally, no wheezing, no crackles. Normal respiratory effort. No accessory muscle use.  Cardiovascular: Regular rate and rhythm, no murmurs / rubs / gallops. No extremity edema. 2+ pedal pulses. No carotid bruits.  Abdomen: no tenderness, no masses palpated. No hepatosplenomegaly. Bowel sounds positive.  Musculoskeletal: no clubbing / cyanosis. No joint deformity upper and lower extremities. Good ROM, no contractures. Normal muscle tone.  Skin: no rashes, lesions, ulcers. No induration Neurologic: CN 2-12 grossly intact. Sensation intact, DTR normal. Strength 5/5 in all 4.  Psychiatric: Normal judgment and insight. Alert and oriented x 3. Normal mood.    Labs on Admission: I have personally reviewed following labs and imaging studies  CBC: Recent Labs  Lab 06/02/19 1537 06/03/19 0146  WBC 9.0 6.5  NEUTROABS 6.2 4.3  HGB 12.3* 11.7*  HCT 36.3* 35.8*  MCV 88.1 89.3  PLT 342 696   Basic Metabolic Panel: Recent Labs  Lab 06/02/19 1537  NA 136  K 3.1*  CL 93*  CO2 29  GLUCOSE 125*  BUN 12  CREATININE 1.35*  CALCIUM 10.7*   GFR: Estimated Creatinine Clearance: 58.1 mL/min (A) (by C-G  formula based on SCr of 1.35 mg/dL (H)). Liver Function Tests: Recent Labs  Lab 06/02/19 1537  AST 144*  ALT 147*  ALKPHOS 752*  BILITOT 21.7*  PROT 7.9  ALBUMIN 3.1*   No results for input(s): LIPASE, AMYLASE in the last 168 hours. No results for input(s): AMMONIA in the last 168 hours. Coagulation Profile: Recent Labs  Lab 06/03/19 0146  INR 1.0   Cardiac Enzymes: No results for input(s): CKTOTAL, CKMB, CKMBINDEX, TROPONINI in the last 168 hours. BNP (last 3 results) No results for input(s): PROBNP in the last 8760 hours. HbA1C: No results for input(s): HGBA1C in the last 72 hours. CBG: No results for input(s): GLUCAP in the last 168 hours. Lipid Profile: No results for input(s): CHOL, HDL, LDLCALC, TRIG, CHOLHDL, LDLDIRECT in the last 72 hours. Thyroid Function Tests: No results for input(s): TSH, T4TOTAL, FREET4, T3FREE, THYROIDAB in the last 72 hours. Anemia Panel: Recent Labs  06/02/19 1537 06/02/19 1538  VITAMINB12 495  --   RETICCTPCT  --  2.6   Urine analysis:    Component Value Date/Time   COLORURINE AMBER (A) 06/02/2019 2017   APPEARANCEUR HAZY (A) 06/02/2019 2017   LABSPEC 1.019 06/02/2019 2017   PHURINE 5.0 06/02/2019 2017   GLUCOSEU NEGATIVE 06/02/2019 2017   HGBUR SMALL (A) 06/02/2019 2017   BILIRUBINUR MODERATE (A) 06/02/2019 2017   KETONESUR NEGATIVE 06/02/2019 2017   PROTEINUR 100 (A) 06/02/2019 2017   NITRITE NEGATIVE 06/02/2019 2017   LEUKOCYTESUR SMALL (A) 06/02/2019 2017    Radiological Exams on Admission: No results found.  EKG: Independently reviewed.  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 Malignant biliary obstruction - 1. Sounds like plan is for stent exchange based on Dr. Rhea Belton documented discussion with Dr. Ardis Hughs 2. Call GI / Dr. Ardis Hughs in  AM 3. Keeping patient NPO 4. Will skip imaging for the moment and defer any need for this to GI 2. HTN - 1. Cont home BP meds 3. Pancreatic CA - 1. Seeing Dr. Krista Blue as outpt  DVT prophylaxis: SCDs - needs ERCP / stent exchange Code Status: Full Family Communication: No family in room Disposition Plan: Home after stent exchanged and obstructive jaundice improving Consults called: None, Call Dr. Ardis Hughs in AM Admission status: Admit to inpatient  Severity of Illness: The appropriate patient status for this patient is INPATIENT. Inpatient status is judged to be reasonable and necessary in order to provide the required intensity of service to ensure the patient's safety. The patient's presenting symptoms, physical exam findings, and initial radiographic and laboratory data in the context of their chronic comorbidities is felt to place them at high risk for further clinical deterioration. Furthermore, it is not anticipated that the patient will be medically stable for discharge from the hospital within 2 midnights of admission. The following factors support the patient status of inpatient.   IP status for ERCP with biliary stent exchange   * I certify that at the point of admission it is my clinical judgment that the patient will require inpatient hospital care spanning beyond 2 midnights from the point of admission due to high intensity of service, high risk for further deterioration and high frequency of surveillance required.*    Chalonda Schlatter M. DO [facility]  How to contact the [facility] Attending or Consulting provider [place] or covering provider during after hours [place], for this patient?  1. Check the care team in [facility] and look for a) attending/consulting [facility] provider listed and b) the [facility] team listed 2. Log into [email or web address]  Amion Physician Scheduling and messaging for groups and whole hospitals  On call and physician scheduling software for group practices, residents,  hospitalists and other medical providers for call, clinic, rotation and shift schedules. On[employer] is a hospital-wide system for scheduling doctors and paging doctors on call. EasyPlot is for scientific plotting and data analysis.  [email or web address]  and use [place]'s universal password to access. If you do not have the password, please contact the hospital operator.  3. Locate the [facility] provider you are looking for under [facility] and page to a number that you can be directly reached. 4. If you still have difficulty reaching the provider, please page the [facility] (Director on Call) for the Hospitalists listed on  amion for assistance.  06/03/2019, 2:47 AM

## 2019-06-04 DIAGNOSIS — C250 Malignant neoplasm of head of pancreas: Secondary | ICD-10-CM | POA: Diagnosis not present

## 2019-06-04 DIAGNOSIS — C801 Malignant (primary) neoplasm, unspecified: Secondary | ICD-10-CM | POA: Diagnosis not present

## 2019-06-04 DIAGNOSIS — K831 Obstruction of bile duct: Secondary | ICD-10-CM | POA: Diagnosis not present

## 2019-06-04 DIAGNOSIS — R17 Unspecified jaundice: Secondary | ICD-10-CM | POA: Diagnosis not present

## 2019-06-04 LAB — COMPREHENSIVE METABOLIC PANEL
ALT: 125 U/L — ABNORMAL HIGH (ref 0–44)
AST: 127 U/L — ABNORMAL HIGH (ref 15–41)
Albumin: 2.3 g/dL — ABNORMAL LOW (ref 3.5–5.0)
Alkaline Phosphatase: 493 U/L — ABNORMAL HIGH (ref 38–126)
Anion gap: 13 (ref 5–15)
BUN: 14 mg/dL (ref 8–23)
CO2: 22 mmol/L (ref 22–32)
Calcium: 9.1 mg/dL (ref 8.9–10.3)
Chloride: 94 mmol/L — ABNORMAL LOW (ref 98–111)
Creatinine, Ser: 1.31 mg/dL — ABNORMAL HIGH (ref 0.61–1.24)
GFR calc Af Amer: >60 mL/min (ref >60)
GFR calc non Af Amer: 57 mL/min — ABNORMAL LOW (ref >60)
Glucose, Bld: 225 mg/dL — ABNORMAL HIGH (ref 70–99)
Potassium: 3.1 mmol/L — ABNORMAL LOW (ref 3.5–5.1)
Sodium: 129 mmol/L — ABNORMAL LOW (ref 135–145)
Total Bilirubin: 15.6 mg/dL — ABNORMAL HIGH (ref 0.3–1.2)
Total Protein: 6.3 g/dL — ABNORMAL LOW (ref 6.5–8.1)

## 2019-06-04 LAB — CBC
HCT: 30.4 % — ABNORMAL LOW (ref 39.0–52.0)
Hemoglobin: 11 g/dL — ABNORMAL LOW (ref 13.0–17.0)
MCH: 30.5 pg (ref 26.0–34.0)
MCHC: 36.2 g/dL — ABNORMAL HIGH (ref 30.0–36.0)
MCV: 84.2 fL (ref 80.0–100.0)
Platelets: 263 10*3/uL (ref 150–400)
RBC: 3.61 MIL/uL — ABNORMAL LOW (ref 4.22–5.81)
RDW: 12.4 % (ref 11.5–15.5)
WBC: 3.9 10*3/uL — ABNORMAL LOW (ref 4.0–10.5)
nRBC: 0 % (ref 0.0–0.2)

## 2019-06-04 NOTE — Discharge Summary (Signed)
Physician Discharge Summary  Isaac Doyle [id number] DOB: May 08, 1955 DOA: 06/02/2019  PCP: System, Pcp Not In  Admit date: 06/02/2019  Discharge date: 06/04/2019  Admitted From:Home  Disposition:  Home  Recommendations for Outpatient Follow-up:  1. Follow up with PCP in 1-2 weeks 2. Follow-up with Dr. Burr Medico with oncology in 1-2 weeks and repeat labs outpatient 3. Hold amlodipine for now given soft blood pressure readings and resume as blood pressures improve outpatient  Home Health: None  Equipment/Devices: None  Discharge Condition: Stable  CODE STATUS: Full  Diet recommendation: Heart Healthy  Brief/Interim Summary: [name] is a 64 y.o. M with HTN, hx trauma on disability, stage III pancreatic CA newly diagnosed, with recent biliary stent, on irinotecan, oxaliplatin, 5FU who presented with dark urine found to have Bili 21 in oncology clinic.    Case then discussed with GI who recommended stent exchange.  5/1: Patient had undergone stent exchange on 4/30 and is noted to have improved lab work this morning.  His LFTs and bilirubin are downtrending.  He continues to have some mild chronic, asymptomatic hyponatremia related to his alcohol use as well as his cancer.  He is also noted to have some hypokalemia which is being repleted.  He does have home oral potassium that he takes on a regular basis and will need this followed up in the outpatient setting.  He has been seen by GI today with no other acute concerns or complaints noted and he is tolerating diet with no further abdominal pain, nausea, or vomiting.  He no longer has any dark urine and understands that he will follow-up with oncology in the near future.  He is otherwise stable for discharge with no other acute events noted throughout the course of this admission.  Discharge Diagnoses:  Principal Problem:   Malignant biliary obstruction (HCC) Active Problems:   Jaundice   Essential hypertension   Malignant neoplasm  of pancreas (HCC)   Malignant obstructive jaundice (HCC)   Biliary obstruction  Principal discharge diagnosis: Malignant biliary obstruction in the setting of stage III pancreatic adenocarcinoma status post stent exchange.  Discharge Instructions  Discharge Instructions    Diet - low sodium heart healthy   Complete by: As directed    Increase activity slowly   Complete by: As directed      Allergies as of 06/04/2019   No Known Allergies     Medication List    STOP taking these medications   amLODipine 10 MG tablet Commonly known as: NORVASC     TAKE these medications   Advil 200 MG tablet Generic drug: ibuprofen Take 200 mg by mouth every 6 (six) hours as needed for headache.   atenolol 100 MG tablet Commonly known as: TENORMIN Take 1 tablet (100 mg total) by mouth daily.   cetirizine 10 MG tablet Commonly known as: ZYRTEC Take 10 mg by mouth daily.   guaifenesin 400 MG Tabs tablet Commonly known as: HUMIBID E Take 1 tablet (400 mg total) by mouth every 6 (six) hours as needed. What changed: when to take this   magnesium oxide 400 (241.3 Mg) MG tablet Commonly known as: MAG-OX Take 1 tablet (400 mg total) by mouth daily.   ondansetron 8 MG tablet Commonly known as: Zofran Take 1 tablet (8 mg total) by mouth 2 (two) times daily as needed. Start on day 3 after chemotherapy.   potassium chloride SA 20 MEQ tablet Commonly known as: KLOR-CON Take 2 tablets (40 mEq total) by mouth  daily.   prochlorperazine 10 MG tablet Commonly known as: COMPAZINE Take 1 tablet (10 mg total) by mouth every 6 (six) hours as needed (Nausea or vomiting).   sulindac 200 MG tablet Commonly known as: CLINORIL Take 400 mg by mouth 2 (two) times daily.   traZODone 100 MG tablet Commonly known as: DESYREL Take 100 mg by mouth at bedtime.      Follow-up Information    Truitt Merle, MD Follow up in 1 week(s).   Specialties: Hematology, Oncology Contact information: [place] [address] [phone]          No Known Allergies  Consultations:  Oncology  GI   Procedures/Studies: CT CHEST W CONTRAST  Result Date: 05/13/2019 CLINICAL DATA:  Staging pancreatic neoplasm EXAM: CT CHEST, ABDOMEN, AND PELVIS WITH CONTRAST TECHNIQUE: Multidetector CT imaging of the chest, abdomen and pelvis was performed following the standard protocol during bolus administration of intravenous contrast. CONTRAST:  149mL OMNIPAQUE IOHEXOL 350 MG/ML SOLN COMPARISON:  MRI 05/10/2019 FINDINGS: CT CHEST FINDINGS Cardiovascular: No significant vascular findings. Normal heart size. No pericardial effusion. Mediastinum/Nodes: No enlarged mediastinal, hilar, or axillary lymph nodes. Thyroid gland, trachea, and esophagus demonstrate no significant findings. Calcified mediastinal and hilar lymph nodes compatible with prior granulomatous disease. Lungs/Pleura: No pleural effusion. No suspicious lung nodules Musculoskeletal: No chest wall mass or suspicious bone lesions identified. CT ABDOMEN PELVIS FINDINGS Hepatobiliary: Interval placement of common bile duct stent. There is pneumobilia compatible with biliary patency. No suspicious liver lesion suggestive of metastatic disease. Pancreas: There is a diffusely infiltrative process involving the head, neck, body and tail of pancreas. No main duct dilatation identified. -The portal venous confluence and proximal extrahepatic portal vein are partially encased and narrowed. This is best seen on image 66/11 and image 62/11. -lesion may touch the right side of the superior mesenteric artery without evidence for encasement, image 64/11. -No convincing evidence for involvement of the celiac trunk. Spleen: Normal in size without focal abnormality. Adrenals/Urinary Tract: Adrenal glands are unremarkable. Left kidney cysts. Kidneys are otherwise normal, without renal calculi, suspicious lesion, or hydronephrosis. Stomach/Bowel: Stomach is  within normal limits. Visualized bowel loops are unremarkable. No evidence of bowel wall thickening, inflammation or distension. Vascular/Lymphatic: Mild aortic atherosclerosis. No aneurysm. Portacaval lymph node measures 1.3 cm short axis, image 59/11. Other: No ascites. No peritoneal nodule or mass Musculoskeletal: No acute or significant osseous findings. IMPRESSION: 1. Interval placement of common bile duct stent with decompression of the bile ducts. Pneumobilia is now noted compatible with biliary patency. 2. Diffuse infiltrative process involving the head, neck, body and tail of pancreas is identified. The diffusely infiltrative appearance of the pancreas is somewhat unusual. Although favored to represent pancreatic adenocarcinoma, other etiologies to consider include IgG4-related Sclerosing Disease of the pancreas. 3. There is encasement and narrowing of the portal venous confluence and proximal portal vein. Mild soft tissue stranding extends to but does not encase the superior mesenteric artery. No convincing evidence for involvement of the celiac trunk. 4. Borderline enlarged portacaval node. No convincing evidence for liver metastasis or metastatic disease to the chest. 5. Aortic atherosclerosis. Aortic Atherosclerosis (ICD10-I70.0). Electronically Signed   By: Kerby Moors M.D.   On: 05/13/2019 14:44   MR ABDOMEN MRCP WO CONTRAST  Result Date: 05/10/2019 CLINICAL DATA:  Biliary obstruction. Jaundice. EXAM: MRI ABDOMEN WITHOUT CONTRAST  (INCLUDING MRCP) TECHNIQUE: Multiplanar multisequence MR imaging of the abdomen was performed. Heavily T2-weighted images of the biliary and pancreatic ducts were obtained,  and three-dimensional MRCP images were rendered by post processing. COMPARISON:  Ultrasound 05/09/2019 FINDINGS: Lower chest: The lung bases are grossly clear. No pulmonary lesions, pleural or pericardial effusion. Hepatobiliary: Marked intrahepatic biliary dilatation. No obvious hepatic lesions are  identified without contrast. The gallbladder is distended. Small gallstones are noted. There is an abrupt cut off of the common bile duct chest as it enters the region of the pancreatic head. The duct is dilated to 16 mm. No common bile duct stones are identified. Pancreas: Suspect infiltrating pancreatic neoplasm in the pancreatic head obstructing the common bile duct. There is also mild dilatation of the main pancreatic duct and the minor pancreatic duct. Difficult to clearly identify or measure the lesion without contrast. Spleen:  Upper limits of normal in size. No splenic lesions. Adrenals/Urinary Tract: The adrenal glands are unremarkable. Bilateral renal cysts are noted. Stomach/Bowel: The stomach, duodenum, visualized small bowel and visualize colon are grossly normal. Vascular/Lymphatic:  The aorta is normal in caliber. No definite mesenteric or retroperitoneal adenopathy. Other:  No ascites or abdominal wall hernia. Musculoskeletal: No significant bony findings. IMPRESSION: 1. 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 Difficult to identify for certain without contrast or assess the major vascular structures. Patient may require a pancreatic protocol CT scan. Recommend GI consultation for biliary drainage and probable EUS and biopsy. 2. No obvious adenopathy or metastatic disease. 3. Marked intrahepatic and extrahepatic biliary dilatation. 4. Cholelithiasis. 5. Bilateral renal cysts. Electronically Signed   By: Marijo Sanes M.D.   On: 05/10/2019 07:29   US Abdomen Limited  Result Date: 05/09/2019 CLINICAL DATA:  64 year old male with jaundice and elevated LFTs. EXAM: ULTRASOUND ABDOMEN LIMITED RIGHT UPPER QUADRANT COMPARISON:  None. FINDINGS: Gallbladder: The gallbladder is distended. There is sludge and stones within the gallbladder. No gallbladder wall thickening or pericholecystic fluid. Common bile duct: Diameter: 15 mm. There is  dilatation of the common bile duct as well as moderate intrahepatic biliary ductal dilatation. A centrally obstructing stone or mass is not excluded. Further initial evaluation with CT of the abdomen pelvis with IV contrast is recommended. MRCP may provide additional evaluation based on CT findings. Liver: There is mild diffuse increased liver echogenicity most commonly seen in the setting of fatty infiltration. Superimposed inflammation or fibrosis is not excluded. Clinical correlation is recommended. Portal vein is patent on color Doppler imaging with normal direction of blood flow towards the liver. Other: There is a 2.0 x 1.9 x 1.8 cm hypoechoic lesion in the region of the porta pedis and the head of the pancreas. IMPRESSION: Cholelithiasis with findings concerning for central biliary obstruction. Further evaluation with CT with IV contrast is recommended. No definite sonographic findings of acute cholecystitis. Electronically Signed   By: Anner Crete M.D.   On: 05/09/2019 21:15   DG ERCP  Result Date: 06/03/2019 CLINICAL DATA:  64 year old male with a history of biliary stricture EXAM: ERCP TECHNIQUE: Multiple spot images obtained with the fluoroscopic device and submitted for interpretation post-procedure. FLUOROSCOPY TIME:  Fluoroscopy Time:  2 minutes 31 seconds COMPARISON:  None. FINDINGS: Limited intraoperative fluoroscopic spot images of ERCP. Initial image demonstrates the endoscope projecting over the upper abdomen with a plastic biliary stent in position. Subsequently there has been removal of the stent placement of a safety wire and partial opacification of the extrahepatic biliary ducts. Final image demonstrates placement of a metallic biliary stent peer IMPRESSION: Limited images of ERCP demonstrates removal of a plastic  biliary stent and placement of a metallic biliary stent of the common bile duct. Please refer to the dictated operative report for full details of intraoperative findings  and procedure. Electronically Signed   By: Corrie Mckusick D.O.   On: 06/03/2019 12:31   DG ERCP BILIARY & PANCREATIC DUCTS  Result Date: 05/11/2019 CLINICAL DATA:  64 year old male with biliary obstruction EXAM: ERCP TECHNIQUE: Multiple spot images obtained with the fluoroscopic device and submitted for interpretation post-procedure. FLUOROSCOPY TIME:  Fluoroscopy Time:  6 minutes 57 seconds COMPARISON:  MR 05/10/2019 FINDINGS: Limited intraoperative fluoroscopic spot images performed. Initial image demonstrates endoscope projecting over the upper abdomen. There is subsequently cannulation of the ampulla with retrograde infusion of contrast. Ossification of the extrahepatic biliary ducts with dilation. Final image demonstrates placement of a plastic biliary stent. IMPRESSION: Limited images during ERCP demonstrates treatment of extrahepatic biliary system with deployment of plastic biliary stent. Please refer to the dictated operative report for full details of intraoperative findings and procedure. Electronically Signed   By: Corrie Mckusick D.O.   On: 05/11/2019 17:07   CT PANCREAS ABD W/WO  Result Date: 05/13/2019 CLINICAL DATA:  Staging pancreatic neoplasm EXAM: CT CHEST, ABDOMEN, AND PELVIS WITH CONTRAST TECHNIQUE: Multidetector CT imaging of the chest, abdomen and pelvis was performed following the standard protocol during bolus administration of intravenous contrast. CONTRAST:  127mL OMNIPAQUE IOHEXOL 350 MG/ML SOLN COMPARISON:  MRI 05/10/2019 FINDINGS: CT CHEST FINDINGS Cardiovascular: No significant vascular findings. Normal heart size. No pericardial effusion. Mediastinum/Nodes: No enlarged mediastinal, hilar, or axillary lymph nodes. Thyroid gland, trachea, and esophagus demonstrate no significant findings. Calcified mediastinal and hilar lymph nodes compatible with prior granulomatous disease. Lungs/Pleura: No pleural effusion. No suspicious lung nodules Musculoskeletal: No chest wall mass or  suspicious bone lesions identified. CT ABDOMEN PELVIS FINDINGS Hepatobiliary: Interval placement of common bile duct stent. There is pneumobilia compatible with biliary patency. No suspicious liver lesion suggestive of metastatic disease. Pancreas: There is a diffusely infiltrative process involving the head, neck, body and tail of pancreas. No main duct dilatation identified. -The portal venous confluence and proximal extrahepatic portal vein are partially encased and narrowed. This is best seen on image 66/11 and image 62/11. -lesion may touch the right side of the superior mesenteric artery without evidence for encasement, image 64/11. -No convincing evidence for involvement of the celiac trunk. Spleen: Normal in size without focal abnormality. Adrenals/Urinary Tract: Adrenal glands are unremarkable. Left kidney cysts. Kidneys are otherwise normal, without renal calculi, suspicious lesion, or hydronephrosis. Stomach/Bowel: Stomach is within normal limits. Visualized bowel loops are unremarkable. No evidence of bowel wall thickening, inflammation or distension. Vascular/Lymphatic: Mild aortic atherosclerosis. No aneurysm. Portacaval lymph node measures 1.3 cm short axis, image 59/11. Other: No ascites. No peritoneal nodule or mass Musculoskeletal: No acute or significant osseous findings. IMPRESSION: 1. Interval placement of common bile duct stent with decompression of the bile ducts. Pneumobilia is now noted compatible with biliary patency. 2. Diffuse infiltrative process involving the head, neck, body and tail of pancreas is identified. The diffusely infiltrative appearance of the pancreas is somewhat unusual. Although favored to represent pancreatic adenocarcinoma, other etiologies to consider include IgG4-related Sclerosing Disease of the pancreas. 3. There is encasement and narrowing of the portal venous confluence and proximal portal vein. Mild soft tissue stranding extends to but does not encase the  superior mesenteric artery. No convincing evidence for involvement of the celiac trunk. 4. Borderline enlarged portacaval node. No convincing evidence for liver metastasis or metastatic disease  to the chest. 5. Aortic atherosclerosis. Aortic Atherosclerosis (ICD10-I70.0). Electronically Signed   By: Kerby Moors M.D.   On: 05/13/2019 14:44     Discharge Exam: Vitals:   06/04/19 0233 06/04/19 0542  BP: 118/67 114/63  Pulse: 61 (!) 57  Resp: 14 16  Temp: (!) 97.4 F (36.3 C) (!) 97.4 F (36.3 C)  SpO2: 100% 100%   Vitals:   06/03/19 1839 06/03/19 2140 06/04/19 0233 06/04/19 0542  BP: 119/67 120/73 118/67 114/63  Pulse: 65 (!) 57 61 (!) 57  Resp: 18 20 14 16   Temp: 97.7 F (36.5 C) 97.6 F (36.4 C) (!) 97.4 F (36.3 C) (!) 97.4 F (36.3 C)  TempSrc: Oral Oral Oral Oral  SpO2: 100% 100% 100% 100%  Weight:      Height:        General: Pt is alert, awake, not in acute distress Cardiovascular: RRR, S1/S2 +, no rubs, no gallops Respiratory: CTA bilaterally, no wheezing, no rhonchi Abdominal: Soft, NT, ND, bowel sounds + Extremities: no edema, no cyanosis    The results of significant diagnostics from this hospitalization (including imaging, microbiology, ancillary and laboratory) are listed below for reference.     Microbiology: Recent Results (from the past 240 hour(s))  Respiratory Panel by RT PCR (Flu A&B, Covid) - Nasopharyngeal Swab     Status: None   Collection Time: 06/03/19  2:00 AM   Specimen: Nasopharyngeal Swab  Result Value Ref Range Status   SARS Coronavirus 2 by RT PCR NEGATIVE NEGATIVE Final    Comment: (NOTE) SARS-CoV-2 target nucleic acids are NOT DETECTED. The SARS-CoV-2 RNA is generally detectable in upper respiratoy specimens during the acute phase of infection. The lowest concentration of SARS-CoV-2 viral copies this assay can detect is 131 copies/mL. A negative result does not preclude SARS-Cov-2 infection and should not be used as the sole basis  for treatment or other patient management decisions. A negative result may occur with  improper specimen collection/handling, submission of specimen other than nasopharyngeal swab, presence of viral mutation(s) within the areas targeted by this assay, and inadequate number of viral copies (<131 copies/mL). A negative result must be combined with clinical observations, patient history, and epidemiological information. The expected result is Negative. Fact Sheet for Patients:  PinkCheek.be Fact Sheet for Healthcare Providers:  GravelBags.it This test is not yet ap proved or cleared by the [place] FDA and  has been authorized for detection and/or diagnosis of SARS-CoV-2 by FDA under an Emergency Use Authorization (EUA). This EUA will remain  in effect (meaning this test can be used) for the duration of the COVID-19 declaration under Section 564(b)(1) of the Act, 21 U.S.C. section 360bbb-3(b)(1), unless the authorization is terminated or revoked sooner.    Influenza A by PCR NEGATIVE NEGATIVE Final   Influenza B by PCR NEGATIVE NEGATIVE Final    Comment: (NOTE) The Xpert Xpress SARS-CoV-2/FLU/RSV assay is intended as an aid in  the diagnosis of influenza from Nasopharyngeal swab specimens and  should not be used as a sole basis for treatment. Nasal washings and  aspirates are unacceptable for Xpert Xpress SARS-CoV-2/FLU/RSV  testing. Fact Sheet for Patients: PinkCheek.be Fact Sheet for Healthcare Providers: GravelBags.it This test is not yet approved or cleared by the [place] FDA and  has been authorized for detection and/or diagnosis of SARS-CoV-2 by  FDA under an Emergency Use Authorization (EUA). This EUA will remain  in effect (meaning this test can be used) for the duration of the  Covid-19 declaration under Section 564(b)(1) of the Act, 21  U.S.C.  section 360bbb-3(b)(1), unless the authorization is  terminated or revoked. Performed at [facility], [place] [address] [place], [place] [address]      Labs: BNP (last 3 results) No results for input(s): BNP in the last 8760 hours. Basic Metabolic Panel: Recent Labs  Lab 06/02/19 1537 06/03/19 0146 06/04/19 0204  NA 136 130* 129*  K 3.1* 2.9* 3.1*  CL 93* 91* 94*  CO2 29 24 22   GLUCOSE 125* 146* 225*  BUN 12 10 14   CREATININE 1.35* 1.16 1.31*  CALCIUM 10.7* 9.7 9.1  MG  --  1.7  --    Liver Function Tests: Recent Labs  Lab 06/02/19 1537 06/03/19 0146 06/04/19 0204  AST 144* 136* 127*  ALT 147* 129* 125*  ALKPHOS 752* 594* 493*  BILITOT 21.7* 22.0* 15.6*  PROT 7.9 7.3 6.3*  ALBUMIN 3.1* 3.0* 2.3*   Recent Labs  Lab 06/03/19 0146  LIPASE 17   No results for input(s): AMMONIA in the last 168 hours. CBC: Recent Labs  Lab 06/02/19 1537 06/03/19 0146 06/04/19 0204  WBC 9.0 6.5 3.9*  NEUTROABS 6.2 4.3  --   HGB 12.3* 11.7* 11.0*  HCT 36.3*  37.0* 35.8* 30.4*  MCV 88.1 89.3 84.2  PLT 342 288 263   Cardiac Enzymes: No results for input(s): CKTOTAL, CKMB, CKMBINDEX, TROPONINI in the last 168 hours. BNP: Invalid input(s): POCBNP CBG: No results for input(s): GLUCAP in the last 168 hours. D-Dimer No results for input(s): DDIMER in the last 72 hours. Hgb A1c No results for input(s): HGBA1C in the last 72 hours. Lipid Profile No results for input(s): CHOL, HDL, LDLCALC, TRIG, CHOLHDL, LDLDIRECT in the last 72 hours. Thyroid function studies No results for input(s): TSH, T4TOTAL, T3FREE, THYROIDAB in the last 72 hours.  Invalid input(s): FREET3 Anemia work up Recent Labs    06/02/19 1537 06/02/19 1538  VITAMINB12 495  --   FERRITIN  --  4,690*  TIBC  --  281  IRON  --  42  RETICCTPCT  --  2.6   Urinalysis    Component Value Date/Time   COLORURINE AMBER (A) 06/02/2019 2017   APPEARANCEUR HAZY (A) 06/02/2019 2017   LABSPEC 1.019  06/02/2019 2017   PHURINE 5.0 06/02/2019 2017   GLUCOSEU NEGATIVE 06/02/2019 2017   HGBUR SMALL (A) 06/02/2019 2017   BILIRUBINUR MODERATE (A) 06/02/2019 2017   KETONESUR NEGATIVE 06/02/2019 2017   PROTEINUR 100 (A) 06/02/2019 2017   NITRITE NEGATIVE 06/02/2019 2017   LEUKOCYTESUR SMALL (A) 06/02/2019 2017   Sepsis Labs Invalid input(s): PROCALCITONIN,  WBC,  LACTICIDVEN Microbiology Recent Results (from the past 240 hour(s))  Respiratory Panel by RT PCR (Flu A&B, Covid) - Nasopharyngeal Swab     Status: None   Collection Time: 06/03/19  2:00 AM   Specimen: Nasopharyngeal Swab  Result Value Ref Range Status   SARS Coronavirus 2 by RT PCR NEGATIVE NEGATIVE Final    Comment: (NOTE) SARS-CoV-2 target nucleic acids are NOT DETECTED. The SARS-CoV-2 RNA is generally detectable in upper respiratoy specimens during the acute phase of infection. The lowest concentration of SARS-CoV-2 viral copies this assay can detect is 131 copies/mL. A negative result does not preclude SARS-Cov-2 infection and should not be used as the sole basis for treatment or other patient management decisions. A negative result may occur with  improper specimen collection/handling, submission of specimen other than nasopharyngeal swab, presence of viral mutation(s)  within the areas targeted by this assay, and inadequate number of viral copies (<131 copies/mL). A negative result must be combined with clinical observations, patient history, and epidemiological information. The expected result is Negative. Fact Sheet for Patients:  PinkCheek.be Fact Sheet for Healthcare Providers:  GravelBags.it This test is not yet ap proved or cleared by the [place] FDA and  has been authorized for detection and/or diagnosis of SARS-CoV-2 by FDA under an Emergency Use Authorization (EUA). This EUA will remain  in effect (meaning this test can be used) for the duration  of the COVID-19 declaration under Section 564(b)(1) of the Act, 21 U.S.C. section 360bbb-3(b)(1), unless the authorization is terminated or revoked sooner.    Influenza A by PCR NEGATIVE NEGATIVE Final   Influenza B by PCR NEGATIVE NEGATIVE Final    Comment: (NOTE) The Xpert Xpress SARS-CoV-2/FLU/RSV assay is intended as an aid in  the diagnosis of influenza from Nasopharyngeal swab specimens and  should not be used as a sole basis for treatment. Nasal washings and  aspirates are unacceptable for Xpert Xpress SARS-CoV-2/FLU/RSV  testing. Fact Sheet for Patients: PinkCheek.be Fact Sheet for Healthcare Providers: GravelBags.it This test is not yet approved or cleared by the [place] FDA and  has been authorized for detection and/or diagnosis of SARS-CoV-2 by  FDA under an Emergency Use Authorization (EUA). This EUA will remain  in effect (meaning this test can be used) for the duration of the  Covid-19 declaration under Section 564(b)(1) of the Act, 21  U.S.C. section 360bbb-3(b)(1), unless the authorization is  terminated or revoked. Performed at [facility], [place] [address] [place], [place] [address]      Time coordinating discharge: 35 minutes  SIGNED:   Rodena Goldmann, DO [facility] 06/04/2019, 10:02 AM  If 7PM-7AM, please contact night-coverage [email or web address]

## 2019-06-04 NOTE — Progress Notes (Signed)
Patient discharged to home with instructions. 

## 2019-06-04 NOTE — Progress Notes (Signed)
[facility] ROUNDING NOTE   Subjective: ERCP completed yesterday with successful stent exchange with placement of covered metal stent.  No issues overnight.  Tolerating p.o. intake without issue.  T bili downtrending nicely and H&H largely stable.  Objective: Vital signs in last 24 hours: Temp:  [97.4 F (36.3 C)-98.8 F (37.1 C)] 97.4 F (36.3 C) (05/01 0542) Pulse Rate:  [52-72] 57 (05/01 0542) Resp:  [12-20] 16 (05/01 0542) BP: (114-145)/(63-83) 114/63 (05/01 0542) SpO2:  [98 %-100 %] 100 % (05/01 0542) Last BM Date: 06/01/19 General: NAD Abdomen: Soft, NT, ND Ext:  No c/c/e    Intake/Output from previous day: 04/30 0701 - 05/01 0700 In: 1237.7 [P.O.:720; I.V.:517.7] Out: 530 [Urine:530] Intake/Output this shift: No intake/output data recorded.   Lab Results: Recent Labs    06/02/19 1537 06/03/19 0146 06/04/19 0204  WBC 9.0 6.5 3.9*  HGB 12.3* 11.7* 11.0*  PLT 342 288 263  MCV 88.1 89.3 84.2   BMET Recent Labs    06/02/19 1537 06/03/19 0146 06/04/19 0204  NA 136 130* 129*  K 3.1* 2.9* 3.1*  CL 93* 91* 94*  CO2 29 24 22   GLUCOSE 125* 146* 225*  BUN 12 10 14   CREATININE 1.35* 1.16 1.31*  CALCIUM 10.7* 9.7 9.1   LFT Recent Labs    06/02/19 1537 06/03/19 0146 06/04/19 0204  PROT 7.9 7.3 6.3*  ALBUMIN 3.1* 3.0* 2.3*  AST 144* 136* 127*  ALT 147* 129* 125*  ALKPHOS 752* 594* 493*  BILITOT 21.7* 22.0* 15.6*   PT/INR Recent Labs    06/03/19 0146  INR 1.0      Imaging/Other results: DG ERCP  Result Date: 06/03/2019 CLINICAL DATA:  64 year old male with a history of biliary stricture EXAM: ERCP TECHNIQUE: Multiple spot images obtained with the fluoroscopic device and submitted for interpretation post-procedure. FLUOROSCOPY TIME:  Fluoroscopy Time:  2 minutes 31 seconds COMPARISON:  None. FINDINGS: Limited intraoperative fluoroscopic spot images of ERCP. Initial image demonstrates the endoscope projecting over the upper abdomen  with a plastic biliary stent in position. Subsequently there has been removal of the stent placement of a safety wire and partial opacification of the extrahepatic biliary ducts. Final image demonstrates placement of a metallic biliary stent peer IMPRESSION: Limited images of ERCP demonstrates removal of a plastic biliary stent and placement of a metallic biliary stent of the common bile duct. Please refer to the dictated operative report for full details of intraoperative findings and procedure. Electronically Signed   By: Corrie Mckusick D.O.   On: 06/03/2019 12:31      Assessment and Plan:  1) Pancreatic adenocarcinoma 2) Biliary obstruction 2/2 malignancy 3) Jaundice  ERCP with stent exchange on 06/03/2019 with placement of covered metal stent.  Doing well now.  T bili down to 15.6 (from 22 yesterday).  Tolerating p.o. intake, no abdominal pain, no e/o bleeding or post ERCP pancreatitis.  -Okay to discharge home from a GI standpoint -To follow-up in the Oncology clinic    Lavena Bullion, DO  06/04/2019, 9:36 AM [facility] Pager [phone]

## 2019-06-06 ENCOUNTER — Other Ambulatory Visit: Payer: No Typology Code available for payment source

## 2019-06-06 ENCOUNTER — Ambulatory Visit: Payer: No Typology Code available for payment source

## 2019-06-06 NOTE — Progress Notes (Signed)
Spoke with patient's daughter Isaac Doyle regarding appointment for 5/5 for port placement.  Instructed to arrive at [facility] by 9:30, NPO past midnight and must have a driver.  I explained we will be moving his chemotherapy appointment to either 5/12 or 5/13.  She verbalized an understanding.

## 2019-06-07 ENCOUNTER — Telehealth: Payer: Self-pay | Admitting: Hematology

## 2019-06-07 ENCOUNTER — Other Ambulatory Visit: Payer: Self-pay | Admitting: Radiology

## 2019-06-07 NOTE — Telephone Encounter (Signed)
Scheduled appt per 4/30 sch message  - pt daughter aware of 5/13 appts .

## 2019-06-08 ENCOUNTER — Other Ambulatory Visit: Payer: Self-pay

## 2019-06-08 ENCOUNTER — Ambulatory Visit (HOSPITAL_COMMUNITY)
Admission: RE | Admit: 2019-06-08 | Discharge: 2019-06-08 | Disposition: A | Discharge status: Home / Self Care | Payer: No Typology Code available for payment source | Source: Ambulatory Visit | Attending: Hematology | Admitting: Hematology

## 2019-06-08 ENCOUNTER — Encounter: Payer: Self-pay | Admitting: Hematology

## 2019-06-08 ENCOUNTER — Encounter (HOSPITAL_COMMUNITY): Payer: Self-pay

## 2019-06-08 ENCOUNTER — Inpatient Hospital Stay: Payer: No Typology Code available for payment source

## 2019-06-08 DIAGNOSIS — Z79899 Other long term (current) drug therapy: Secondary | ICD-10-CM | POA: Diagnosis not present

## 2019-06-08 DIAGNOSIS — I10 Essential (primary) hypertension: Secondary | ICD-10-CM | POA: Insufficient documentation

## 2019-06-08 DIAGNOSIS — Z807 Family history of other malignant neoplasms of lymphoid, hematopoietic and related tissues: Secondary | ICD-10-CM | POA: Insufficient documentation

## 2019-06-08 DIAGNOSIS — E876 Hypokalemia: Secondary | ICD-10-CM

## 2019-06-08 DIAGNOSIS — C250 Malignant neoplasm of head of pancreas: Secondary | ICD-10-CM | POA: Diagnosis present

## 2019-06-08 HISTORY — PX: IR IMAGING GUIDED PORT INSERTION: IMG5740

## 2019-06-08 LAB — BASIC METABOLIC PANEL
Anion gap: 14 (ref 5–15)
BUN: 18 mg/dL (ref 8–23)
CO2: 24 mmol/L (ref 22–32)
Calcium: 9.1 mg/dL (ref 8.9–10.3)
Chloride: 98 mmol/L (ref 98–111)
Creatinine, Ser: 1.03 mg/dL (ref 0.61–1.24)
GFR calc Af Amer: >60 mL/min (ref >60)
GFR calc non Af Amer: >60 mL/min (ref >60)
Glucose, Bld: 123 mg/dL — ABNORMAL HIGH (ref 70–99)
Potassium: 2.7 mmol/L — CL (ref 3.5–5.1)
Sodium: 136 mmol/L (ref 135–145)

## 2019-06-08 LAB — CBC WITH DIFFERENTIAL/PLATELET
Abs Immature Granulocytes: 0.02 10*3/uL (ref 0.00–0.07)
Basophils Absolute: 0 10*3/uL (ref 0.0–0.1)
Basophils Relative: 0 %
Eosinophils Absolute: 0.1 10*3/uL (ref 0.0–0.5)
Eosinophils Relative: 1 %
HCT: 36.2 % — ABNORMAL LOW (ref 39.0–52.0)
Hemoglobin: 11.9 g/dL — ABNORMAL LOW (ref 13.0–17.0)
Immature Granulocytes: 0 %
Lymphocytes Relative: 24 %
Lymphs Abs: 2.3 10*3/uL (ref 0.7–4.0)
MCH: 30.4 pg (ref 26.0–34.0)
MCHC: 32.9 g/dL (ref 30.0–36.0)
MCV: 92.6 fL (ref 80.0–100.0)
Monocytes Absolute: 0.6 10*3/uL (ref 0.1–1.0)
Monocytes Relative: 7 %
Neutro Abs: 6.4 10*3/uL (ref 1.7–7.7)
Neutrophils Relative %: 68 %
Platelets: 300 10*3/uL (ref 150–400)
RBC: 3.91 MIL/uL — ABNORMAL LOW (ref 4.22–5.81)
RDW: 12.8 % (ref 11.5–15.5)
WBC: 9.4 10*3/uL (ref 4.0–10.5)
nRBC: 0 % (ref 0.0–0.2)

## 2019-06-08 MED ORDER — POTASSIUM CHLORIDE CRYS ER 20 MEQ PO TBCR
40.0000 meq | EXTENDED_RELEASE_TABLET | ORAL | Status: AC
  Administered 2019-06-08: 40 meq via ORAL
  Filled 2019-06-08: qty 2

## 2019-06-08 MED ORDER — MIDAZOLAM HCL 2 MG/2ML IJ SOLN
INTRAMUSCULAR | Status: AC | PRN
  Administered 2019-06-08 (×2): 1 mg via INTRAVENOUS

## 2019-06-08 MED ORDER — LIDOCAINE HCL (PF) 1 % IJ SOLN
INTRAMUSCULAR | Status: AC | PRN
  Administered 2019-06-08: 15 mL

## 2019-06-08 MED ORDER — HEPARIN SOD (PORK) LOCK FLUSH 100 UNIT/ML IV SOLN
INTRAVENOUS | Status: AC
  Filled 2019-06-08: qty 5

## 2019-06-08 MED ORDER — CEFAZOLIN SODIUM-DEXTROSE 2-4 GM/100ML-% IV SOLN
INTRAVENOUS | Status: AC
  Administered 2019-06-08: 2 g via INTRAVENOUS
  Filled 2019-06-08: qty 100

## 2019-06-08 MED ORDER — POTASSIUM CHLORIDE CRYS ER 20 MEQ PO TBCR: 40.0000 meq | EXTENDED_RELEASE_TABLET | Freq: Every day | ORAL | 0 refills | Status: DC

## 2019-06-08 MED ORDER — MIDAZOLAM HCL 2 MG/2ML IJ SOLN
INTRAMUSCULAR | Status: AC
  Filled 2019-06-08: qty 4

## 2019-06-08 MED ORDER — SODIUM CHLORIDE 0.9 % IV SOLN: INTRAVENOUS | Status: DC

## 2019-06-08 MED ORDER — FENTANYL CITRATE (PF) 100 MCG/2ML IJ SOLN
INTRAMUSCULAR | Status: AC
  Filled 2019-06-08: qty 2

## 2019-06-08 MED ORDER — CEFAZOLIN SODIUM-DEXTROSE 2-4 GM/100ML-% IV SOLN: 2.0000 g | INTRAVENOUS | Status: AC

## 2019-06-08 MED ORDER — LIDOCAINE HCL 1 % IJ SOLN
INTRAMUSCULAR | Status: AC
Start: 2019-06-08 — End: 2019-06-08
  Filled 2019-06-08: qty 20

## 2019-06-08 MED ORDER — FENTANYL CITRATE (PF) 100 MCG/2ML IJ SOLN
INTRAMUSCULAR | Status: AC | PRN
  Administered 2019-06-08 (×2): 50 ug via INTRAVENOUS

## 2019-06-08 NOTE — Consult Note (Signed)
Chief Complaint: Patient was seen in consultation today for Port-A-Cath placement  Referring Physician(s): Feng,Yan  Supervising Physician: Corrie Mckusick  Patient Status: [facility] - Out-[facility]  History of Present Illness: Isaac Doyle is a 64 y.o. male with history of  newly diagnosed pancreatic cancer who presents today for Port-A-Cath placement for chemotherapy.  Past Medical History:  Diagnosis Date  . Hypertension     Past Surgical History:  Procedure Laterality Date  . BILIARY STENT PLACEMENT  05/11/2019   Procedure: BILIARY STENT PLACEMENT;  Surgeon: Irene Shipper, MD;  Location: [facility] ENDOSCOPY;  Service: Endoscopy;;  . BILIARY STENT PLACEMENT  06/03/2019   Procedure: BILIARY STENT PLACEMENT;  Surgeon: Jackquline Denmark, MD;  Location: [facility] ENDOSCOPY;  Service: Endoscopy;;  . ERCP N/A 05/11/2019   Procedure: ENDOSCOPIC RETROGRADE CHOLANGIOPANCREATOGRAPHY (ERCP);  Surgeon: Irene Shipper, MD;  Location: [facility] ENDOSCOPY;  Service: Endoscopy;  Laterality: N/A;  with   . ERCP N/A 06/03/2019   Procedure: ENDOSCOPIC RETROGRADE CHOLANGIOPANCREATOGRAPHY (ERCP);  Surgeon: Jackquline Denmark, MD;  Location: [facility] ENDOSCOPY;  Service: Endoscopy;  Laterality: N/A;  . ESOPHAGOGASTRODUODENOSCOPY (EGD) WITH PROPOFOL N/A 05/19/2019   Procedure: ESOPHAGOGASTRODUODENOSCOPY (EGD) WITH PROPOFOL;  Surgeon: [name], MD;  Location: [facility] ENDOSCOPY;  Service: Endoscopy;  Laterality: N/A;  . EUS N/A 05/19/2019   Procedure: UPPER ENDOSCOPIC ULTRASOUND (EUS) LINEAR;  Surgeon: [name], MD;  Location: [facility] ENDOSCOPY;  Service: Endoscopy;  Laterality: N/A;  . FINE NEEDLE ASPIRATION N/A 05/19/2019   Procedure: FINE NEEDLE ASPIRATION (FNA) LINEAR;  Surgeon: [name], MD;  Location: [facility] ENDOSCOPY;  Service: Endoscopy;  Laterality: N/A;  . SPHINCTEROTOMY  05/11/2019   Procedure: SPHINCTEROTOMY;  Surgeon: Irene Shipper, MD;  Location: [facility] ENDOSCOPY;  Service: Endoscopy;;  . [name] REMOVAL  06/03/2019   Procedure: STENT  REMOVAL;  Surgeon: Jackquline Denmark, MD;  Location: [facility] ENDOSCOPY;  Service: Endoscopy;;    Allergies: Patient has no known allergies.  Medications: Prior to Admission medications   Medication Sig Start Date End Date Taking? Authorizing Provider  atenolol (TENORMIN) 100 MG tablet Take 1 tablet (100 mg total) by mouth daily. 05/14/19   Geradine Girt, DO  cetirizine (ZYRTEC) 10 MG tablet Take 10 mg by mouth daily.    [provider]  guaifenesin (HUMIBID E) 400 MG TABS tablet Take 1 tablet (400 mg total) by mouth every 6 (six) hours as needed. Patient taking differently: Take 400 mg by mouth in the morning and at bedtime.  05/13/19   Geradine Girt, DO  ibuprofen (ADVIL) 200 MG tablet Take 200 mg by mouth every 6 (six) hours as needed for headache.    [provider]  magnesium oxide (MAG-OX) 400 (241.3 Mg) MG tablet Take 1 tablet (400 mg total) by mouth daily. 05/14/19   Geradine Girt, DO  ondansetron (ZOFRAN) 8 MG tablet Take 1 tablet (8 mg total) by mouth 2 (two) times daily as needed. Start on day 3 after chemotherapy. 05/25/19   Truitt Merle, MD  potassium chloride SA (KLOR-CON) 20 MEQ tablet Take 2 tablets (40 mEq total) by mouth daily. 05/13/19   Geradine Girt, DO  prochlorperazine (COMPAZINE) 10 MG tablet Take 1 tablet (10 mg total) by mouth every 6 (six) hours as needed (Nausea or vomiting). 05/25/19   Truitt Merle, MD  sulindac (CLINORIL) 200 MG tablet Take 400 mg by mouth 2 (two) times daily.    [provider]  traZODone (DESYREL) 100 MG tablet Take 100 mg by mouth  at bedtime.    [provider]     Family History  Problem Relation Age of Onset  . Lymphoma Mother     Social History   Socioeconomic History  . Marital status: Divorced    Spouse name: Not on file  . Number of children: 1  . Years of education: Not on file  . Highest education level: Not on file  Occupational History  . Occupation: disabled veteran   Tobacco Use  . Smoking status:  Never Smoker  . Smokeless tobacco: Never Used  . Tobacco comment: plan to quit completely   Substance and Sexual Activity  . Alcohol use: Yes    Alcohol/week: 22.0 standard drinks    Types: 12 Cans of beer, 10 Shots of liquor per week  . Drug use: Not Currently  . Sexual activity: Not on file  Other Topics Concern  . Not on file  Social History Narrative  . Not on file   Social Determinants of Health   Financial Resource Strain:   . Difficulty of Paying Living Expenses:   Food Insecurity:   . Worried About Charity fundraiser in the Last Year:   . Arboriculturist in the Last Year:   Transportation Needs:   . Film/video editor (Medical):   Marland Kitchen Lack of Transportation (Non-Medical):   Physical Activity:   . Days of Exercise per Week:   . Minutes of Exercise per Session:   Stress:   . Feeling of Stress :   Social Connections:   . Frequency of Communication with Friends and Family:   . Frequency of Social Gatherings with Friends and Family:   . Attends Religious Services:   . Active Member of Clubs or Organizations:   . Attends Archivist Meetings:   Marland Kitchen Marital Status:      Review of Systems he currently denies fever, headache, chest pain, dyspnea, cough, abdominal/back pain, nausea, vomiting or bleeding.   Vital Signs: BP 139/86 (BP Location: Left Arm)   Pulse (!) 53   Temp 98 F (36.7 C) (Oral)   Resp 16   SpO2 100%   Physical Exam awake, alert.  Chest clear to auscultation bilaterally.  Heart with slightly bradycardic but regular rhythm.  Abdomen soft, positive bowel sounds, nontender.  No significant lower extremity edema.  Imaging: CT CHEST W CONTRAST  Result Date: 05/13/2019 CLINICAL DATA:  Staging pancreatic neoplasm EXAM: CT CHEST, ABDOMEN, AND PELVIS WITH CONTRAST TECHNIQUE: Multidetector CT imaging of the chest, abdomen and pelvis was performed following the standard protocol during bolus administration of intravenous contrast. CONTRAST:  178mL  OMNIPAQUE IOHEXOL 350 MG/ML SOLN COMPARISON:  MRI 05/10/2019 FINDINGS: CT CHEST FINDINGS Cardiovascular: No significant vascular findings. Normal heart size. No pericardial effusion. Mediastinum/Nodes: No enlarged mediastinal, hilar, or axillary lymph nodes. Thyroid gland, trachea, and esophagus demonstrate no significant findings. Calcified mediastinal and hilar lymph nodes compatible with prior granulomatous disease. Lungs/Pleura: No pleural effusion. No suspicious lung nodules Musculoskeletal: No chest wall mass or suspicious bone lesions identified. CT ABDOMEN PELVIS FINDINGS Hepatobiliary: Interval placement of common bile duct stent. There is pneumobilia compatible with biliary patency. No suspicious liver lesion suggestive of metastatic disease. Pancreas: There is a diffusely infiltrative process involving the head, neck, body and tail of pancreas. No main duct dilatation identified. -The portal venous confluence and proximal extrahepatic portal vein are partially encased and narrowed. This is best seen on image 66/11 and image 62/11. -lesion may touch the right side of  the superior mesenteric artery without evidence for encasement, image 64/11. -No convincing evidence for involvement of the celiac trunk. Spleen: Normal in size without focal abnormality. Adrenals/Urinary Tract: Adrenal glands are unremarkable. Left kidney cysts. Kidneys are otherwise normal, without renal calculi, suspicious lesion, or hydronephrosis. Stomach/Bowel: Stomach is within normal limits. Visualized bowel loops are unremarkable. No evidence of bowel wall thickening, inflammation or distension. Vascular/Lymphatic: Mild aortic atherosclerosis. No aneurysm. Portacaval lymph node measures 1.3 cm short axis, image 59/11. Other: No ascites. No peritoneal nodule or mass Musculoskeletal: No acute or significant osseous findings. IMPRESSION: 1. Interval placement of common bile duct stent with decompression of the bile ducts. Pneumobilia is  now noted compatible with biliary patency. 2. Diffuse infiltrative process involving the head, neck, body and tail of pancreas is identified. The diffusely infiltrative appearance of the pancreas is somewhat unusual. Although favored to represent pancreatic adenocarcinoma, other etiologies to consider include IgG4-related Sclerosing Disease of the pancreas. 3. There is encasement and narrowing of the portal venous confluence and proximal portal vein. Mild soft tissue stranding extends to but does not encase the superior mesenteric artery. No convincing evidence for involvement of the celiac trunk. 4. Borderline enlarged portacaval node. No convincing evidence for liver metastasis or metastatic disease to the chest. 5. Aortic atherosclerosis. Aortic Atherosclerosis (ICD10-I70.0). Electronically Signed   By: Kerby Moors M.D.   On: 05/13/2019 14:44   MR ABDOMEN MRCP WO CONTRAST  Result Date: 05/10/2019 CLINICAL DATA:  Biliary obstruction. Jaundice. EXAM: MRI ABDOMEN WITHOUT CONTRAST  (INCLUDING MRCP) TECHNIQUE: Multiplanar multisequence MR imaging of the abdomen was performed. Heavily T2-weighted images of the biliary and pancreatic ducts were obtained, and three-dimensional MRCP images were rendered by post processing. COMPARISON:  Ultrasound 05/09/2019 FINDINGS: Lower chest: The lung bases are grossly clear. No pulmonary lesions, pleural or pericardial effusion. Hepatobiliary: Marked intrahepatic biliary dilatation. No obvious hepatic lesions are identified without contrast. The gallbladder is distended. Small gallstones are noted. There is an abrupt cut off of the common bile duct chest as it enters the region of the pancreatic head. The duct is dilated to 16 mm. No common bile duct stones are identified. Pancreas: Suspect infiltrating pancreatic neoplasm in the pancreatic head obstructing the common bile duct. There is also mild dilatation of the main pancreatic duct and the minor pancreatic duct. Difficult  to clearly identify or measure the lesion without contrast. Spleen:  Upper limits of normal in size. No splenic lesions. Adrenals/Urinary Tract: The adrenal glands are unremarkable. Bilateral renal cysts are noted. Stomach/Bowel: The stomach, duodenum, visualized small bowel and visualize colon are grossly normal. Vascular/Lymphatic:  The aorta is normal in caliber. No definite mesenteric or retroperitoneal adenopathy. Other:  No ascites or abdominal wall hernia. Musculoskeletal: No significant bony findings. IMPRESSION: 1. 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 Difficult to identify for certain without contrast or assess the major vascular structures. Patient may require a pancreatic protocol CT scan. Recommend GI consultation for biliary drainage and probable EUS and biopsy. 2. No obvious adenopathy or metastatic disease. 3. Marked intrahepatic and extrahepatic biliary dilatation. 4. Cholelithiasis. 5. Bilateral renal cysts. Electronically Signed   By: Marijo Sanes M.D.   On: 05/10/2019 07:29   US Abdomen Limited  Result Date: 05/09/2019 CLINICAL DATA:  64 year old male with jaundice and elevated LFTs. EXAM: ULTRASOUND ABDOMEN LIMITED RIGHT UPPER QUADRANT COMPARISON:  None. FINDINGS: Gallbladder: The gallbladder is distended. There is sludge and stones within the gallbladder. No  gallbladder wall thickening or pericholecystic fluid. Common bile duct: Diameter: 15 mm. There is dilatation of the common bile duct as well as moderate intrahepatic biliary ductal dilatation. A centrally obstructing stone or mass is not excluded. Further initial evaluation with CT of the abdomen pelvis with IV contrast is recommended. MRCP may provide additional evaluation based on CT findings. Liver: There is mild diffuse increased liver echogenicity most commonly seen in the setting of fatty infiltration. Superimposed inflammation or fibrosis is not excluded.  Clinical correlation is recommended. Portal vein is patent on color Doppler imaging with normal direction of blood flow towards the liver. Other: There is a 2.0 x 1.9 x 1.8 cm hypoechoic lesion in the region of the porta pedis and the head of the pancreas. IMPRESSION: Cholelithiasis with findings concerning for central biliary obstruction. Further evaluation with CT with IV contrast is recommended. No definite sonographic findings of acute cholecystitis. Electronically Signed   By: Anner Crete M.D.   On: 05/09/2019 21:15   DG ERCP  Result Date: 06/03/2019 CLINICAL DATA:  65 year old male with a history of biliary stricture EXAM: ERCP TECHNIQUE: Multiple spot images obtained with the fluoroscopic device and submitted for interpretation post-procedure. FLUOROSCOPY TIME:  Fluoroscopy Time:  2 minutes 31 seconds COMPARISON:  None. FINDINGS: Limited intraoperative fluoroscopic spot images of ERCP. Initial image demonstrates the endoscope projecting over the upper abdomen with a plastic biliary stent in position. Subsequently there has been removal of the stent placement of a safety wire and partial opacification of the extrahepatic biliary ducts. Final image demonstrates placement of a metallic biliary stent peer IMPRESSION: Limited images of ERCP demonstrates removal of a plastic biliary stent and placement of a metallic biliary stent of the common bile duct. Please refer to the dictated operative report for full details of intraoperative findings and procedure. Electronically Signed   By: Corrie Mckusick D.O.   On: 06/03/2019 12:31   DG ERCP BILIARY & PANCREATIC DUCTS  Result Date: 05/11/2019 CLINICAL DATA:  64 year old male with biliary obstruction EXAM: ERCP TECHNIQUE: Multiple spot images obtained with the fluoroscopic device and submitted for interpretation post-procedure. FLUOROSCOPY TIME:  Fluoroscopy Time:  6 minutes 57 seconds COMPARISON:  MR 05/10/2019 FINDINGS: Limited intraoperative fluoroscopic  spot images performed. Initial image demonstrates endoscope projecting over the upper abdomen. There is subsequently cannulation of the ampulla with retrograde infusion of contrast. Ossification of the extrahepatic biliary ducts with dilation. Final image demonstrates placement of a plastic biliary stent. IMPRESSION: Limited images during ERCP demonstrates treatment of extrahepatic biliary system with deployment of plastic biliary stent. Please refer to the dictated operative report for full details of intraoperative findings and procedure. Electronically Signed   By: Corrie Mckusick D.O.   On: 05/11/2019 17:07   CT PANCREAS ABD W/WO  Result Date: 05/13/2019 CLINICAL DATA:  Staging pancreatic neoplasm EXAM: CT CHEST, ABDOMEN, AND PELVIS WITH CONTRAST TECHNIQUE: Multidetector CT imaging of the chest, abdomen and pelvis was performed following the standard protocol during bolus administration of intravenous contrast. CONTRAST:  171mL OMNIPAQUE IOHEXOL 350 MG/ML SOLN COMPARISON:  MRI 05/10/2019 FINDINGS: CT CHEST FINDINGS Cardiovascular: No significant vascular findings. Normal heart size. No pericardial effusion. Mediastinum/Nodes: No enlarged mediastinal, hilar, or axillary lymph nodes. Thyroid gland, trachea, and esophagus demonstrate no significant findings. Calcified mediastinal and hilar lymph nodes compatible with prior granulomatous disease. Lungs/Pleura: No pleural effusion. No suspicious lung nodules Musculoskeletal: No chest wall mass or suspicious bone lesions identified. CT ABDOMEN PELVIS FINDINGS Hepatobiliary: Interval placement of common bile duct  stent. There is pneumobilia compatible with biliary patency. No suspicious liver lesion suggestive of metastatic disease. Pancreas: There is a diffusely infiltrative process involving the head, neck, body and tail of pancreas. No main duct dilatation identified. -The portal venous confluence and proximal extrahepatic portal vein are partially encased and  narrowed. This is best seen on image 66/11 and image 62/11. -lesion may touch the right side of the superior mesenteric artery without evidence for encasement, image 64/11. -No convincing evidence for involvement of the celiac trunk. Spleen: Normal in size without focal abnormality. Adrenals/Urinary Tract: Adrenal glands are unremarkable. Left kidney cysts. Kidneys are otherwise normal, without renal calculi, suspicious lesion, or hydronephrosis. Stomach/Bowel: Stomach is within normal limits. Visualized bowel loops are unremarkable. No evidence of bowel wall thickening, inflammation or distension. Vascular/Lymphatic: Mild aortic atherosclerosis. No aneurysm. Portacaval lymph node measures 1.3 cm short axis, image 59/11. Other: No ascites. No peritoneal nodule or mass Musculoskeletal: No acute or significant osseous findings. IMPRESSION: 1. Interval placement of common bile duct stent with decompression of the bile ducts. Pneumobilia is now noted compatible with biliary patency. 2. Diffuse infiltrative process involving the head, neck, body and tail of pancreas is identified. The diffusely infiltrative appearance of the pancreas is somewhat unusual. Although favored to represent pancreatic adenocarcinoma, other etiologies to consider include IgG4-related Sclerosing Disease of the pancreas. 3. There is encasement and narrowing of the portal venous confluence and proximal portal vein. Mild soft tissue stranding extends to but does not encase the superior mesenteric artery. No convincing evidence for involvement of the celiac trunk. 4. Borderline enlarged portacaval node. No convincing evidence for liver metastasis or metastatic disease to the chest. 5. Aortic atherosclerosis. Aortic Atherosclerosis (ICD10-I70.0). Electronically Signed   By: Kerby Moors M.D.   On: 05/13/2019 14:44    Labs:  CBC: Recent Labs    05/13/19 0446 06/02/19 1537 06/03/19 0146 06/04/19 0204  WBC 11.3* 9.0 6.5 3.9*  HGB 9.6*  12.3* 11.7* 11.0*  HCT 28.1* 36.3*  37.0* 35.8* 30.4*  PLT 411* 342 288 263    COAGS: Recent Labs    05/10/19 1625 05/11/19 0516 06/03/19 0146  INR 2.4* 1.3* 1.0    BMP: Recent Labs    05/19/19 0742 06/02/19 1537 06/03/19 0146 06/04/19 0204  NA 133* 136 130* 129*  K 3.2* 3.1* 2.9* 3.1*  CL 96* 93* 91* 94*  CO2 24 29 24 22   GLUCOSE 105* 125* 146* 225*  BUN 18 12 10 14   CALCIUM 9.3 10.7* 9.7 9.1  CREATININE 1.35* 1.35* 1.16 1.31*  GFRNONAA 55* 55* >60 57*  GFRAA >60 >60 >60 >60    LIVER FUNCTION TESTS: Recent Labs    05/19/19 0742 06/02/19 1537 06/03/19 0146 06/04/19 0204  BILITOT 15.1* 21.7* 22.0* 15.6*  AST 95* 144* 136* 127*  ALT 111* 147* 129* 125*  ALKPHOS 288* 752* 594* 493*  PROT 7.6 7.9 7.3 6.3*  ALBUMIN 3.2* 3.1* 3.0* 2.3*    TUMOR MARKERS: No results for input(s): AFPTM, CEA, CA199, CHROMGRNA in the last 8760 hours.  Assessment and Plan: 64 y.o. male with history of  newly diagnosed pancreatic cancer who presents today for Port-A-Cath placement for chemotherapy.Risks and benefits of image guided port-a-catheter placement was discussed with the patient including, but not limited to bleeding, infection, pneumothorax, or fibrin sheath development and need for additional procedures.  All of the patient's questions were answered, patient is agreeable to proceed. Consent signed and in chart.      Thank you for  this interesting consult.  I greatly enjoyed meeting Isaac Doyle and look forward to participating in their care.  A copy of this report was sent to the requesting provider on this date.  Electronically Signed: D. Rowe Samit, PA-C 06/08/2019, 9:33 AM   I spent a total of  25 minutes   in face to face in clinical consultation, greater than 50% of which was counseling/coordinating care for Port-A-Cath placement

## 2019-06-08 NOTE — Progress Notes (Signed)
Spoke with patient's daughter, potassium low today, per Dr. Burr Medico he needs to take Potassium 20 meq one twice daily until he comes in to see [place] next week. We will recheck his level at that time.  A new script was sent into [facility] on [address] (in chart).  She verbalized an understanding.

## 2019-06-08 NOTE — Progress Notes (Signed)
Patient came to bring proof of income for grant.  Patient approved for one-time $1000 [employer] grant to assist with personal expenses while going through treatment. Explained in detail expenses and how they are covered. Gave him a copy of the approval letter and expense sheet along with the [facility] OP pharmacy information.  He has my card for any additional financial questions or concerns.

## 2019-06-08 NOTE — Progress Notes (Signed)
CRITICAL VALUE ALERT  Critical Value:  Potassium 2.7  Date & Time Notied:  06/08/2019 1025  Provider Notified: Rowe Zaire, PA  Orders Received/Actions taken: Verbal orders given for 15mEq Potassium Chloride PO

## 2019-06-08 NOTE — Procedures (Signed)
Interventional Radiology Procedure Note  Procedure: Placement of a right IJ approach single lumen PowerPort.  Tip is positioned at the superior cavoatrial junction and catheter is ready for immediate use.  Complications: None Recommendations:  - Ok to shower tomorrow - Do not submerge for 7 days - Routine line care   Signed,  Nyjai Graff S. Shahmeer Bunn, DO   

## 2019-06-08 NOTE — Discharge Instructions (Signed)

## 2019-06-14 NOTE — Progress Notes (Signed)
Pharmacist Chemotherapy Monitoring - Follow Up Assessment    I verify that I have reviewed each item in the below checklist:  . Regimen for the patient is scheduled for the appropriate day and plan matches scheduled date. Marland Kitchen Appropriate non-routine labs are ordered dependent on drug ordered. . If applicable, additional medications reviewed and ordered per protocol based on lifetime cumulative doses and/or treatment regimen.   Plan for follow-up and/or issues identified: No . I-vent associated with next due treatment: No . MD and/or nursing notified: No  Isaac Doyle 06/14/2019 9:00 AM

## 2019-06-15 ENCOUNTER — Telehealth: Payer: Self-pay | Admitting: Medical Oncology
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
## 2019-06-15 NOTE — Telephone Encounter (Signed)
[employer]: Blood Sample Collection to Evaluate Biomarkers in Subjects with Untreated Solid Tumors LVMOM with patient regarding study and blood draw. Informed patient that blood has not been collected and due to it being more than two weeks since his consent signing we will need to reconsent him, if he is still interested in study participation. Informed patient, I will meet him upon arrival to clinic and just prior to his scheduled lab appt for start of treatment tomorrow. . Patient thanked and encouraged to call me with questions in the meantime.

## 2019-06-16 ENCOUNTER — Inpatient Hospital Stay: Payer: No Typology Code available for payment source

## 2019-06-16 ENCOUNTER — Encounter: Payer: Self-pay | Admitting: Medical Oncology

## 2019-06-16 ENCOUNTER — Inpatient Hospital Stay: Payer: No Typology Code available for payment source | Attending: Hematology

## 2019-06-16 ENCOUNTER — Inpatient Hospital Stay (HOSPITAL_BASED_OUTPATIENT_CLINIC_OR_DEPARTMENT_OTHER): Payer: No Typology Code available for payment source | Admitting: Hematology

## 2019-06-16 ENCOUNTER — Telehealth: Payer: Self-pay | Admitting: Hematology

## 2019-06-16 ENCOUNTER — Other Ambulatory Visit: Payer: Self-pay

## 2019-06-16 ENCOUNTER — Encounter: Payer: Self-pay | Admitting: Hematology

## 2019-06-16 VITALS — BP 146/77 | HR 56 | Temp 98.3°F | Resp 20 | Ht 71.0 in | Wt 180.7 lb

## 2019-06-16 DIAGNOSIS — I10 Essential (primary) hypertension: Secondary | ICD-10-CM | POA: Insufficient documentation

## 2019-06-16 DIAGNOSIS — C250 Malignant neoplasm of head of pancreas: Secondary | ICD-10-CM

## 2019-06-16 DIAGNOSIS — R17 Unspecified jaundice: Secondary | ICD-10-CM | POA: Diagnosis not present

## 2019-06-16 DIAGNOSIS — E876 Hypokalemia: Secondary | ICD-10-CM | POA: Diagnosis not present

## 2019-06-16 DIAGNOSIS — Z452 Encounter for adjustment and management of vascular access device: Secondary | ICD-10-CM | POA: Diagnosis not present

## 2019-06-16 DIAGNOSIS — R634 Abnormal weight loss: Secondary | ICD-10-CM | POA: Insufficient documentation

## 2019-06-16 DIAGNOSIS — Z5189 Encounter for other specified aftercare: Secondary | ICD-10-CM | POA: Insufficient documentation

## 2019-06-16 DIAGNOSIS — Z95828 Presence of other vascular implants and grafts: Secondary | ICD-10-CM

## 2019-06-16 DIAGNOSIS — C258 Malignant neoplasm of overlapping sites of pancreas: Secondary | ICD-10-CM | POA: Insufficient documentation

## 2019-06-16 DIAGNOSIS — Z5111 Encounter for antineoplastic chemotherapy: Secondary | ICD-10-CM | POA: Diagnosis not present

## 2019-06-16 LAB — CMP (CANCER CENTER ONLY)
ALT: 45 U/L — ABNORMAL HIGH (ref 0–44)
AST: 31 U/L (ref 15–41)
Albumin: 3.2 g/dL — ABNORMAL LOW (ref 3.5–5.0)
Alkaline Phosphatase: 206 U/L — ABNORMAL HIGH (ref 38–126)
Anion gap: 10 (ref 5–15)
BUN: 12 mg/dL (ref 8–23)
CO2: 26 mmol/L (ref 22–32)
Calcium: 9.7 mg/dL (ref 8.9–10.3)
Chloride: 102 mmol/L (ref 98–111)
Creatinine: 0.98 mg/dL (ref 0.61–1.24)
GFR, Est AFR Am: >60 mL/min (ref >60)
GFR, Estimated: >60 mL/min (ref >60)
Glucose, Bld: 113 mg/dL — ABNORMAL HIGH (ref 70–99)
Potassium: 3.2 mmol/L — ABNORMAL LOW (ref 3.5–5.1)
Sodium: 138 mmol/L (ref 135–145)
Total Bilirubin: 5.7 mg/dL (ref 0.3–1.2)
Total Protein: 6.9 g/dL (ref 6.5–8.1)

## 2019-06-16 LAB — CBC WITH DIFFERENTIAL (CANCER CENTER ONLY)
Abs Immature Granulocytes: 0.03 10*3/uL (ref 0.00–0.07)
Basophils Absolute: 0 10*3/uL (ref 0.0–0.1)
Basophils Relative: 0 %
Eosinophils Absolute: 0.1 10*3/uL (ref 0.0–0.5)
Eosinophils Relative: 1 %
HCT: 32.6 % — ABNORMAL LOW (ref 39.0–52.0)
Hemoglobin: 10.9 g/dL — ABNORMAL LOW (ref 13.0–17.0)
Immature Granulocytes: 0 %
Lymphocytes Relative: 26 %
Lymphs Abs: 2.5 10*3/uL (ref 0.7–4.0)
MCH: 29.3 pg (ref 26.0–34.0)
MCHC: 33.4 g/dL (ref 30.0–36.0)
MCV: 87.6 fL (ref 80.0–100.0)
Monocytes Absolute: 0.5 10*3/uL (ref 0.1–1.0)
Monocytes Relative: 6 %
Neutro Abs: 6.4 10*3/uL (ref 1.7–7.7)
Neutrophils Relative %: 67 %
Platelet Count: 255 10*3/uL (ref 150–400)
RBC: 3.72 MIL/uL — ABNORMAL LOW (ref 4.22–5.81)
RDW: 12.5 % (ref 11.5–15.5)
WBC Count: 9.6 10*3/uL (ref 4.0–10.5)
nRBC: 0 % (ref 0.0–0.2)

## 2019-06-16 LAB — RESEARCH LABS

## 2019-06-16 MED ORDER — PALONOSETRON HCL INJECTION 0.25 MG/5ML
0.2500 mg | Freq: Once | INTRAVENOUS | Status: AC
  Administered 2019-06-16: 0.25 mg via INTRAVENOUS

## 2019-06-16 MED ORDER — SODIUM CHLORIDE 0.9 % IV SOLN
400.0000 mg/m2 | Freq: Once | INTRAVENOUS | Status: AC
  Administered 2019-06-16: 828 mg via INTRAVENOUS
  Filled 2019-06-16: qty 41.4

## 2019-06-16 MED ORDER — DEXTROSE 5 % IV SOLN
Freq: Once | INTRAVENOUS | Status: AC
  Filled 2019-06-16: qty 250

## 2019-06-16 MED ORDER — ATROPINE SULFATE 1 MG/ML IJ SOLN
INTRAMUSCULAR | Status: AC
  Filled 2019-06-16: qty 1

## 2019-06-16 MED ORDER — ATROPINE SULFATE 1 MG/ML IJ SOLN
0.5000 mg | Freq: Once | INTRAMUSCULAR | Status: AC | PRN
  Administered 2019-06-16: 0.5 mg via INTRAVENOUS

## 2019-06-16 MED ORDER — SODIUM CHLORIDE 0.9 % IV SOLN
10.0000 mg | Freq: Once | INTRAVENOUS | Status: AC
  Administered 2019-06-16: 10 mg via INTRAVENOUS
  Filled 2019-06-16: qty 1
  Filled 2019-06-16: qty 10

## 2019-06-16 MED ORDER — SODIUM CHLORIDE 0.9 % IV SOLN
110.0000 mg/m2 | Freq: Once | INTRAVENOUS | Status: AC
  Administered 2019-06-16: 220 mg via INTRAVENOUS
  Filled 2019-06-16: qty 11

## 2019-06-16 MED ORDER — SODIUM CHLORIDE 0.9 % IV SOLN
1000.0000 mg/m2 | INTRAVENOUS | Status: DC
  Administered 2019-06-16: 2050 mg via INTRAVENOUS
  Filled 2019-06-16: qty 41

## 2019-06-16 MED ORDER — LIDOCAINE-PRILOCAINE 2.5-2.5 % EX CREA
1.0000 | TOPICAL_CREAM | CUTANEOUS | 0 refills | Status: DC | PRN
Start: 2019-06-16 — End: 2019-06-23

## 2019-06-16 MED ORDER — SODIUM CHLORIDE 0.9% FLUSH
10.0000 mL | INTRAVENOUS | Status: DC | PRN
  Administered 2019-06-16: 10 mL via INTRAVENOUS
  Filled 2019-06-16: qty 10

## 2019-06-16 MED ORDER — PALONOSETRON HCL INJECTION 0.25 MG/5ML
INTRAVENOUS | Status: AC
  Filled 2019-06-16: qty 5

## 2019-06-16 MED ORDER — SODIUM CHLORIDE 0.9 % IV SOLN
150.0000 mg | Freq: Once | INTRAVENOUS | Status: AC
  Administered 2019-06-16: 150 mg via INTRAVENOUS
  Filled 2019-06-16: qty 150
  Filled 2019-06-16: qty 5

## 2019-06-16 MED ORDER — OXALIPLATIN CHEMO INJECTION 100 MG/20ML
72.0000 mg/m2 | Freq: Once | INTRAVENOUS | Status: AC
  Administered 2019-06-16: 150 mg via INTRAVENOUS
  Filled 2019-06-16: qty 30

## 2019-06-16 NOTE — Patient Instructions (Signed)
[facility] Discharge Instructions for Patients Receiving Chemotherapy  Today you received the following chemotherapy agents: Oxaliplatin, Irinotecan, Leucovorin, 5FU  To help prevent nausea and vomiting after your treatment, we encourage you to take your nausea medication as directed.   If you develop nausea and vomiting that is not controlled by your nausea medication, call the clinic.   BELOW ARE SYMPTOMS THAT SHOULD BE REPORTED IMMEDIATELY:  *FEVER GREATER THAN 100.5 F  *CHILLS WITH OR WITHOUT FEVER  NAUSEA AND VOMITING THAT IS NOT CONTROLLED WITH YOUR NAUSEA MEDICATION  *UNUSUAL SHORTNESS OF BREATH  *UNUSUAL BRUISING OR BLEEDING  TENDERNESS IN MOUTH AND THROAT WITH OR WITHOUT PRESENCE OF ULCERS  *URINARY PROBLEMS  *BOWEL PROBLEMS  UNUSUAL RASH Items with * indicate a potential emergency and should be followed up as soon as possible.  Feel free to call the clinic should you have any questions or concerns. The clinic phone number is [phone].  Please show the [facility] at check-in to the Emergency Department and triage nurse.  Oxaliplatin Injection What is this medicine? OXALIPLATIN (ox AL i PLA tin) is a chemotherapy drug. It targets fast dividing cells, like cancer cells, and causes these cells to die. This medicine is used to treat cancers of the colon and rectum, and many other cancers. This medicine may be used for other purposes; ask your health care provider or pharmacist if you have questions. COMMON BRAND NAME(S): Eloxatin What should I tell my health care provider before I take this medicine? They need to know if you have any of these conditions:  heart disease  history of irregular heartbeat  liver disease  low blood counts, like white cells, platelets, or red blood cells  lung or breathing disease, like asthma  take medicines that treat or prevent blood clots  tingling of the fingers or toes, or other nerve disorder  an  unusual or allergic reaction to oxaliplatin, other chemotherapy, other medicines, foods, dyes, or preservatives  pregnant or trying to get pregnant  breast-feeding How should I use this medicine? This drug is given as an infusion into a vein. It is administered in a hospital or clinic by a specially trained health care professional. Talk to your pediatrician regarding the use of this medicine in children. Special care may be needed. Overdosage: If you think you have taken too much of this medicine contact a poison control center or emergency room at once. NOTE: This medicine is only for you. Do not share this medicine with others. What if I miss a dose? It is important not to miss a dose. Call your doctor or health care professional if you are unable to keep an appointment. What may interact with this medicine? Do not take this medicine with any of the following medications:  cisapride  dronedarone  pimozide  thioridazine This medicine may also interact with the following medications:  aspirin and aspirin-like medicines  certain medicines that treat or prevent blood clots like warfarin, apixaban, dabigatran, and rivaroxaban  cisplatin  cyclosporine  diuretics  medicines for infection like acyclovir, adefovir, amphotericin B, bacitracin, cidofovir, foscarnet, ganciclovir, gentamicin, pentamidine, vancomycin  NSAIDs, medicines for pain and inflammation, like ibuprofen or naproxen  other medicines that prolong the QT interval (an abnormal heart rhythm)  pamidronate  zoledronic acid This list may not describe all possible interactions. Give your health care provider a list of all the medicines, herbs, non-prescription drugs, or dietary supplements you use. Also tell them if you smoke, drink  alcohol, or use illegal drugs. Some items may interact with your medicine. What should I watch for while using this medicine? Your condition will be monitored carefully while you are  receiving this medicine. You may need blood work done while you are taking this medicine. This medicine may make you feel generally unwell. This is not uncommon as chemotherapy can affect healthy cells as well as cancer cells. Report any side effects. Continue your course of treatment even though you feel ill unless your healthcare professional tells you to stop. This medicine can make you more sensitive to cold. Do not drink cold drinks or use ice. Cover exposed skin before coming in contact with cold temperatures or cold objects. When out in cold weather wear warm clothing and cover your mouth and nose to warm the air that goes into your lungs. Tell your doctor if you get sensitive to the cold. Do not become pregnant while taking this medicine or for 9 months after stopping it. Women should inform their health care professional if they wish to become pregnant or think they might be pregnant. Men should not father a child while taking this medicine and for 6 months after stopping it. There is potential for serious side effects to an unborn child. Talk to your health care professional for more information. Do not breast-feed a child while taking this medicine or for 3 months after stopping it. This medicine has caused ovarian failure in some women. This medicine may make it more difficult to get pregnant. Talk to your health care professional if you are concerned about your fertility. This medicine has caused decreased sperm counts in some men. This may make it more difficult to father a child. Talk to your health care professional if you are concerned about your fertility. This medicine may increase your risk of getting an infection. Call your health care professional for advice if you get a fever, chills, or sore throat, or other symptoms of a cold or flu. Do not treat yourself. Try to avoid being around people who are sick. Avoid taking medicines that contain aspirin, acetaminophen, ibuprofen, naproxen,  or ketoprofen unless instructed by your health care professional. These medicines may hide a fever. Be careful brushing or flossing your teeth or using a toothpick because you may get an infection or bleed more easily. If you have any dental work done, tell your dentist you are receiving this medicine. What side effects may I notice from receiving this medicine? Side effects that you should report to your doctor or health care professional as soon as possible:  allergic reactions like skin rash, itching or hives, swelling of the face, lips, or tongue  breathing problems  cough  low blood counts - this medicine may decrease the number of white blood cells, red blood cells, and platelets. You may be at increased risk for infections and bleeding  nausea, vomiting  pain, redness, or irritation at site where injected  pain, tingling, numbness in the hands or feet  signs and symptoms of bleeding such as bloody or black, tarry stools; red or dark brown urine; spitting up blood or brown material that looks like coffee grounds; red spots on the skin; unusual bruising or bleeding from the eyes, gums, or nose  signs and symptoms of a dangerous change in heartbeat or heart rhythm like chest pain; dizziness; fast, irregular heartbeat; palpitations; feeling faint or lightheaded; falls  signs and symptoms of infection like fever; chills; cough; sore throat; pain or trouble passing urine  signs and symptoms of liver injury like dark yellow or brown urine; general ill feeling or flu-like symptoms; light-colored stools; loss of appetite; nausea; right upper belly pain; unusually weak or tired; yellowing of the eyes or skin  signs and symptoms of low red blood cells or anemia such as unusually weak or tired; feeling faint or lightheaded; falls  signs and symptoms of muscle injury like dark urine; trouble passing urine or change in the amount of urine; unusually weak or tired; muscle pain; back pain Side  effects that usually do not require medical attention (report to your doctor or health care professional if they continue or are bothersome):  changes in taste  diarrhea  gas  hair loss  loss of appetite  mouth sores This list may not describe all possible side effects. Call your doctor for medical advice about side effects. You may report side effects to FDA at 1-800-FDA-1088. Where should I keep my medicine? This drug is given in a hospital or clinic and will not be stored at home. NOTE: This sheet is a summary. It may not cover all possible information. If you have questions about this medicine, talk to your doctor, pharmacist, or health care provider.  2020 Elsevier/Gold Standard (2018-06-09 12:20:35)  Irinotecan injection What is this medicine? IRINOTECAN (ir in oh TEE kan ) is a chemotherapy drug. It is used to treat colon and rectal cancer. This medicine may be used for other purposes; ask your health care provider or pharmacist if you have questions. COMMON BRAND NAME(S): Camptosar What should I tell my health care provider before I take this medicine? They need to know if you have any of these conditions:  dehydration  diarrhea  infection (especially a virus infection such as chickenpox, cold sores, or herpes)  liver disease  low blood counts, like low white cell, platelet, or red cell counts  low levels of calcium, magnesium, or potassium in the blood  recent or ongoing radiation therapy  an unusual or allergic reaction to irinotecan, other medicines, foods, dyes, or preservatives  pregnant or trying to get pregnant  breast-feeding How should I use this medicine? This drug is given as an infusion into a vein. It is administered in a hospital or clinic by a specially trained health care professional. Talk to your pediatrician regarding the use of this medicine in children. Special care may be needed. Overdosage: If you think you have taken too much of this  medicine contact a poison control center or emergency room at once. NOTE: This medicine is only for you. Do not share this medicine with others. What if I miss a dose? It is important not to miss your dose. Call your doctor or health care professional if you are unable to keep an appointment. What may interact with this medicine? This medicine may interact with the following medications:  antiviral medicines for HIV or AIDS  certain antibiotics like rifampin or rifabutin  certain medicines for fungal infections like itraconazole, ketoconazole, posaconazole, and voriconazole  certain medicines for seizures like carbamazepine, phenobarbital, phenotoin  clarithromycin  gemfibrozil  nefazodone  St. John's Wort This list may not describe all possible interactions. Give your health care provider a list of all the medicines, herbs, non-prescription drugs, or dietary supplements you use. Also tell them if you smoke, drink alcohol, or use illegal drugs. Some items may interact with your medicine. What should I watch for while using this medicine? Your condition will be monitored carefully while you are receiving this  medicine. You will need important blood work done while you are taking this medicine. This drug may make you feel generally unwell. This is not uncommon, as chemotherapy can affect healthy cells as well as cancer cells. Report any side effects. Continue your course of treatment even though you feel ill unless your doctor tells you to stop. In some cases, you may be given additional medicines to help with side effects. Follow all directions for their use. You may get drowsy or dizzy. Do not drive, use machinery, or do anything that needs mental alertness until you know how this medicine affects you. Do not stand or sit up quickly, especially if you are an older patient. This reduces the risk of dizzy or fainting spells. Call your health care professional for advice if you get a fever,  chills, or sore throat, or other symptoms of a cold or flu. Do not treat yourself. This medicine decreases your body's ability to fight infections. Try to avoid being around people who are sick. Avoid taking products that contain aspirin, acetaminophen, ibuprofen, naproxen, or ketoprofen unless instructed by your doctor. These medicines may hide a fever. This medicine may increase your risk to bruise or bleed. Call your doctor or health care professional if you notice any unusual bleeding. Be careful brushing and flossing your teeth or using a toothpick because you may get an infection or bleed more easily. If you have any dental work done, tell your dentist you are receiving this medicine. Do not become pregnant while taking this medicine or for 6 months after stopping it. Women should inform their health care professional if they wish to become pregnant or think they might be pregnant. Men should not father a child while taking this medicine and for 3 months after stopping it. There is potential for serious side effects to an unborn child. Talk to your health care professional for more information. Do not breast-feed an infant while taking this medicine or for 7 days after stopping it. This medicine has caused ovarian failure in some women. This medicine may make it more difficult to get pregnant. Talk to your health care professional if you are concerned about your fertility. This medicine has caused decreased sperm counts in some men. This may make it more difficult to father a child. Talk to your health care professional if you are concerned about your fertility. What side effects may I notice from receiving this medicine? Side effects that you should report to your doctor or health care professional as soon as possible:  allergic reactions like skin rash, itching or hives, swelling of the face, lips, or tongue  chest pain  diarrhea  flushing, runny nose, sweating during infusion  low blood  counts - this medicine may decrease the number of white blood cells, red blood cells and platelets. You may be at increased risk for infections and bleeding.  nausea, vomiting  pain, swelling, warmth in the leg  signs of decreased platelets or bleeding - bruising, pinpoint red spots on the skin, black, tarry stools, blood in the urine  signs of infection - fever or chills, cough, sore throat, pain or difficulty passing urine  signs of decreased red blood cells - unusually weak or tired, fainting spells, lightheadedness Side effects that usually do not require medical attention (report to your doctor or health care professional if they continue or are bothersome):  constipation  hair loss  headache  loss of appetite  mouth sores  stomach pain This list may not  describe all possible side effects. Call your doctor for medical advice about side effects. You may report side effects to FDA at 1-800-FDA-1088. Where should I keep my medicine? This drug is given in a hospital or clinic and will not be stored at home. NOTE: This sheet is a summary. It may not cover all possible information. If you have questions about this medicine, talk to your doctor, pharmacist, or health care provider.  2020 Elsevier/Gold Standard (2018-03-12 10:09:17)  Leucovorin injection What is this medicine? LEUCOVORIN (loo koe VOR in) is used to prevent or treat the harmful effects of some medicines. This medicine is used to treat anemia caused by a low amount of folic acid in the body. It is also used with 5-fluorouracil (5-FU) to treat colon cancer. This medicine may be used for other purposes; ask your health care provider or pharmacist if you have questions. What should I tell my health care provider before I take this medicine? They need to know if you have any of these conditions:  anemia from low levels of vitamin B-12 in the blood  an unusual or allergic reaction to leucovorin, folic acid, other  medicines, foods, dyes, or preservatives  pregnant or trying to get pregnant  breast-feeding How should I use this medicine? This medicine is for injection into a muscle or into a vein. It is given by a health care professional in a hospital or clinic setting. Talk to your pediatrician regarding the use of this medicine in children. Special care may be needed. Overdosage: If you think you have taken too much of this medicine contact a poison control center or emergency room at once. NOTE: This medicine is only for you. Do not share this medicine with others. What if I miss a dose? This does not apply. What may interact with this medicine?  capecitabine  fluorouracil  phenobarbital  phenytoin  primidone  trimethoprim-sulfamethoxazole This list may not describe all possible interactions. Give your health care provider a list of all the medicines, herbs, non-prescription drugs, or dietary supplements you use. Also tell them if you smoke, drink alcohol, or use illegal drugs. Some items may interact with your medicine. What should I watch for while using this medicine? Your condition will be monitored carefully while you are receiving this medicine. This medicine may increase the side effects of 5-fluorouracil, 5-FU. Tell your doctor or health care professional if you have diarrhea or mouth sores that do not get better or that get worse. What side effects may I notice from receiving this medicine? Side effects that you should report to your doctor or health care professional as soon as possible:  allergic reactions like skin rash, itching or hives, swelling of the face, lips, or tongue  breathing problems  fever, infection  mouth sores  unusual bleeding or bruising  unusually weak or tired Side effects that usually do not require medical attention (report to your doctor or health care professional if they continue or are bothersome):  constipation or diarrhea  loss of  appetite  nausea, vomiting This list may not describe all possible side effects. Call your doctor for medical advice about side effects. You may report side effects to FDA at 1-800-FDA-1088. Where should I keep my medicine? This drug is given in a hospital or clinic and will not be stored at home. NOTE: This sheet is a summary. It may not cover all possible information. If you have questions about this medicine, talk to your doctor, pharmacist, or health care  provider.  2020 Elsevier/Gold Standard (2007-07-27 16:50:29)  Fluorouracil, 5FU; Diclofenac topical cream What is this medicine? FLUOROURACIL; DICLOFENAC (flure oh YOOR a sil; dye KLOE fen ak) is a combination of a topical chemotherapy agent and non-steroidal anti-inflammatory drug (NSAID). It is used on the skin to treat skin cancer and skin conditions that could become cancer. This medicine may be used for other purposes; ask your health care provider or pharmacist if you have questions. COMMON BRAND NAME(S): FLUORAC What should I tell my health care provider before I take this medicine? They need to know if you have any of these conditions:  bleeding problems  cigarette smoker  DPD enzyme deficiency  heart disease  high blood pressure  if you frequently drink alcohol containing drinks  kidney disease  liver disease  open or infected skin  stomach problems  swelling or open sores at the treatment site  recent or planned coronary artery bypass graft (CABG) surgery  an unusual or allergic reaction to fluorouracil, diclofenac, aspirin, other NSAIDs, other medicines, foods, dyes, or preservatives  pregnant or trying to get pregnant  breast-feeding How should I use this medicine? This medicine is only for use on the skin. Follow the directions on the prescription label. Wash hands before and after use. Wash affected area and gently pat dry. To apply this medicine use a cotton-tipped applicator, or use gloves if  applying with fingertips. If applied with unprotected fingertips, it is very important to wash your hands well after you apply this medicine. Avoid applying to the eyes, nose, or mouth. Apply enough medicine to cover the affected area. You can cover the area with a light gauze dressing, but do not use tight or air-tight dressings. Finish the full course prescribed by your doctor or health care professional, even if you think your condition is better. Do not stop taking except on the advice of your doctor or health care professional. Talk to your pediatrician regarding the use of this medicine in children. Special care may be needed. Overdosage: If you think you have taken too much of this medicine contact a poison control center or emergency room at once. NOTE: This medicine is only for you. Do not share this medicine with others. What if I miss a dose? If you miss a dose, apply it as soon as you can. If it is almost time for your next dose, only use that dose. Do not apply extra doses. Contact your doctor or health care professional if you miss more than one dose. What may interact with this medicine? Interactions are not expected. Do not use any other skin products without telling your doctor or health care professional. This list may not describe all possible interactions. Give your health care provider a list of all the medicines, herbs, non-prescription drugs, or dietary supplements you use. Also tell them if you smoke, drink alcohol, or use illegal drugs. Some items may interact with your medicine. What should I watch for while using this medicine? Visit your doctor or healthcare provider for checks on your progress. You will need to use this medicine for 2 to 6 weeks. This may be longer depending on the condition being treated. You may not see full healing for another 1 to 2 months after you stop using the medicine. This medicine may cause serious skin reactions. They can happen weeks to months  after starting the medicine. Contact your healthcare provider right away if you notice fevers or flu-like symptoms with a rash. The rash may  be red or purple and then turn into blisters or peeling of the skin. Or, you might notice a red rash with swelling of the face, lips or lymph nodes in your neck or under your arms. Treated areas of skin can look unsightly during and for several weeks after treatment with this medicine. This medicine can make you more sensitive to the sun. Keep out of the sun. If you cannot avoid being in the sun, wear protective clothing and use sunscreen. Do not use sun lamps or tanning beds/booths. If a pet comes in contact with the area where this medicine was applied to your skin or if it is ingested, they may have a serious risk of side effects. If accidental contact happens, the skin of the pet should be washed right away with soap and water. Contact your vet right away if your pet becomes exposed. Do not become pregnant while taking this medicine. Women should inform their doctor if they wish to become pregnant or think they might be pregnant. There is a potential for serious side effects to an unborn child. Talk to your healthcare provider or pharmacist for more information. What side effects may I notice from receiving this medicine? Side effects that you should report to your doctor or health care professional as soon as possible:  allergic reactions like skin rash, itching or hives, swelling of the face, lips, or tongue  black or bloody stools, blood in the urine or vomit  blurred vision  chest pain  difficulty breathing or wheezing  rash, fever, and swollen lymph nodes  redness, blistering, peeling or loosening of the skin, including inside the mouth  severe redness and swelling of normal skin  slurred speech or weakness on one side of the body  trouble passing urine or change in the amount of urine  unexplained weight gain or swelling  unusually weak  or tired  yellowing of eyes or skin Side effects that usually do not require medical attention (report to your doctor or health care professional if they continue or are bothersome):  increased sensitivity of the skin to sun and ultraviolet light  pain and burning of the affected area  scaling or swelling of the affected area  skin rash, itching of the affected area  tenderness This list may not describe all possible side effects. Call your doctor for medical advice about side effects. You may report side effects to FDA at 1-800-FDA-1088. Where should I keep my medicine? Keep out of the reach of children and pets. Store at room temperature between 20 and 25 degrees C (68 and 77 degrees F). Throw away any unused medicine after the expiration date. NOTE: This sheet is a summary. It may not cover all possible information. If you have questions about this medicine, talk to your doctor, pharmacist, or health care provider.  2020 [employer]vier/Gold Standard (2018-04-07 13:31:57)

## 2019-06-16 NOTE — Patient Instructions (Signed)

## 2019-06-16 NOTE — Research (Signed)
[employer]: Blood Sample Collection to Evaluate Biomarkers in Subjects with Untreated Solid Tumors I met with patient this morning, prior to his lab appointment. Patient confirms receiving my phone message yesterday and confirms with me this morning that he wishes to continue with study participation. I informed patient that we need to re-consent him to study, due to the fact that it has been more than two weeks since the last consent was signed, patient gave verbal understanding to this. Patient was hospitalized and treatment delayed, which in turn delayed the blood draw. I informed patient that there are no changes to this study re-consent form and patient denied having any questions at this time. Patient proceeded to sign, date and time where indicated on this study consent and embedded HIPAA authorization Version 3.0. Once consent form was signed, patient's blood was drawn for the study, through his port at his request. After consent signing and blood sample collected, I reviewed patient's family, tobacco and alcohol history. Per patient, his duration of time he consumed alcohol in years is approximately 42 years, with an average of 22 alcoholic beverages consumed per week and states he has stopped drinking alcohol in March of 2021 (patient's family, tobacco and alcohol history also documented in MD notes dated 05/25/2019). Patient denies a smoking history.  Patient provided with study gift card of $50 at the completion of blood draw today.  Second nurse eligibility verfication by Wilber Bihari. Dr. Burr Medico confirms eligibility. Patient was thanked again for his time and contribution to study. Patient was provided with a copy of his signed consent form today and was encouraged to call Dr. Burr Medico or myself with any questions or concerns he may have.  Blood sample collection kit #:  [id number] [place], RN, BSN, [facility] Clinical Research 06/16/2019 11:42 AM

## 2019-06-16 NOTE — Progress Notes (Signed)
Critical Value:  T. Bili 5.7  Dr. Burr Medico notified

## 2019-06-16 NOTE — Progress Notes (Signed)
Okay to treat with elevated total bili per Dr. Burr Medico - dose reduced.

## 2019-06-16 NOTE — Telephone Encounter (Signed)
Scheduled per 5/13 los. Third treatment not scheduled due to providers availability. Patient to get an updated schedule. Printed AVS for pt.

## 2019-06-18 ENCOUNTER — Other Ambulatory Visit: Payer: Self-pay

## 2019-06-18 ENCOUNTER — Inpatient Hospital Stay: Payer: No Typology Code available for payment source

## 2019-06-18 VITALS — BP 124/58 | HR 57 | Temp 97.8°F | Resp 18

## 2019-06-18 DIAGNOSIS — C250 Malignant neoplasm of head of pancreas: Secondary | ICD-10-CM

## 2019-06-18 DIAGNOSIS — Z5111 Encounter for antineoplastic chemotherapy: Secondary | ICD-10-CM | POA: Diagnosis not present

## 2019-06-18 MED ORDER — SODIUM CHLORIDE 0.9% FLUSH
10.0000 mL | INTRAVENOUS | Status: DC | PRN
  Administered 2019-06-18: 10 mL
  Filled 2019-06-18: qty 10

## 2019-06-18 MED ORDER — HEPARIN SOD (PORK) LOCK FLUSH 100 UNIT/ML IV SOLN
500.0000 [IU] | Freq: Once | INTRAVENOUS | Status: AC | PRN
  Administered 2019-06-18: 500 [IU]
  Filled 2019-06-18: qty 5

## 2019-06-20 ENCOUNTER — Ambulatory Visit: Payer: No Typology Code available for payment source | Admitting: Hematology

## 2019-06-20 ENCOUNTER — Ambulatory Visit: Payer: No Typology Code available for payment source

## 2019-06-20 ENCOUNTER — Encounter: Payer: No Typology Code available for payment source | Admitting: Nutrition

## 2019-06-20 ENCOUNTER — Other Ambulatory Visit: Payer: No Typology Code available for payment source
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
## 2019-06-23 ENCOUNTER — Other Ambulatory Visit: Payer: Self-pay

## 2019-06-23 ENCOUNTER — Inpatient Hospital Stay (HOSPITAL_BASED_OUTPATIENT_CLINIC_OR_DEPARTMENT_OTHER): Payer: No Typology Code available for payment source | Admitting: Hematology

## 2019-06-23 ENCOUNTER — Inpatient Hospital Stay: Payer: No Typology Code available for payment source

## 2019-06-23 ENCOUNTER — Encounter: Payer: Self-pay | Admitting: Hematology

## 2019-06-23 VITALS — BP 120/98 | HR 63 | Temp 97.5°F | Resp 18 | Ht 71.0 in | Wt 178.4 lb

## 2019-06-23 DIAGNOSIS — Z95828 Presence of other vascular implants and grafts: Secondary | ICD-10-CM

## 2019-06-23 DIAGNOSIS — Z5111 Encounter for antineoplastic chemotherapy: Secondary | ICD-10-CM | POA: Diagnosis not present

## 2019-06-23 DIAGNOSIS — C250 Malignant neoplasm of head of pancreas: Secondary | ICD-10-CM

## 2019-06-23 DIAGNOSIS — E876 Hypokalemia: Secondary | ICD-10-CM | POA: Diagnosis not present

## 2019-06-23 LAB — CBC WITH DIFFERENTIAL (CANCER CENTER ONLY)
Abs Immature Granulocytes: 0.03 10*3/uL (ref 0.00–0.07)
Basophils Absolute: 0 10*3/uL (ref 0.0–0.1)
Basophils Relative: 0 %
Eosinophils Absolute: 0.1 10*3/uL (ref 0.0–0.5)
Eosinophils Relative: 2 %
HCT: 29.7 % — ABNORMAL LOW (ref 39.0–52.0)
Hemoglobin: 10.5 g/dL — ABNORMAL LOW (ref 13.0–17.0)
Immature Granulocytes: 1 %
Lymphocytes Relative: 33 %
Lymphs Abs: 1.5 10*3/uL (ref 0.7–4.0)
MCH: 30 pg (ref 26.0–34.0)
MCHC: 35.4 g/dL (ref 30.0–36.0)
MCV: 84.9 fL (ref 80.0–100.0)
Monocytes Absolute: 0.3 10*3/uL (ref 0.1–1.0)
Monocytes Relative: 6 %
Neutro Abs: 2.6 10*3/uL (ref 1.7–7.7)
Neutrophils Relative %: 58 %
Platelet Count: 183 10*3/uL (ref 150–400)
RBC: 3.5 MIL/uL — ABNORMAL LOW (ref 4.22–5.81)
RDW: 11.9 % (ref 11.5–15.5)
WBC Count: 4.4 10*3/uL (ref 4.0–10.5)
nRBC: 0 % (ref 0.0–0.2)

## 2019-06-23 LAB — CMP (CANCER CENTER ONLY)
ALT: 42 U/L (ref 0–44)
AST: 32 U/L (ref 15–41)
Albumin: 3.2 g/dL — ABNORMAL LOW (ref 3.5–5.0)
Alkaline Phosphatase: 148 U/L — ABNORMAL HIGH (ref 38–126)
Anion gap: 11 (ref 5–15)
BUN: 11 mg/dL (ref 8–23)
CO2: 27 mmol/L (ref 22–32)
Calcium: 9.4 mg/dL (ref 8.9–10.3)
Chloride: 94 mmol/L — ABNORMAL LOW (ref 98–111)
Creatinine: 0.86 mg/dL (ref 0.61–1.24)
GFR, Est AFR Am: >60 mL/min (ref >60)
GFR, Estimated: >60 mL/min (ref >60)
Glucose, Bld: 124 mg/dL — ABNORMAL HIGH (ref 70–99)
Potassium: 3 mmol/L — CL (ref 3.5–5.1)
Sodium: 132 mmol/L — ABNORMAL LOW (ref 135–145)
Total Bilirubin: 4.1 mg/dL (ref 0.3–1.2)
Total Protein: 6.8 g/dL (ref 6.5–8.1)

## 2019-06-23 MED ORDER — HEPARIN SOD (PORK) LOCK FLUSH 100 UNIT/ML IV SOLN
500.0000 [IU] | Freq: Once | INTRAVENOUS | Status: AC
  Administered 2019-06-23: 500 [IU] via INTRAVENOUS
  Filled 2019-06-23: qty 5

## 2019-06-23 MED ORDER — LIDOCAINE-PRILOCAINE 2.5-2.5 % EX CREA: 1.0000 "application " | TOPICAL_CREAM | CUTANEOUS | 0 refills | Status: DC | PRN

## 2019-06-23 MED ORDER — SODIUM CHLORIDE 0.9% FLUSH
10.0000 mL | INTRAVENOUS | Status: DC | PRN
  Administered 2019-06-23: 10 mL via INTRAVENOUS
  Filled 2019-06-23: qty 10

## 2019-06-23 NOTE — Progress Notes (Signed)
Critical Value: K 3.0 T. Bili 4.1 Dr. Burr Medico notified

## 2019-06-30 ENCOUNTER — Inpatient Hospital Stay: Payer: No Typology Code available for payment source

## 2019-06-30 ENCOUNTER — Encounter: Payer: Self-pay | Admitting: Hematology

## 2019-06-30 ENCOUNTER — Other Ambulatory Visit: Payer: Self-pay

## 2019-06-30 ENCOUNTER — Inpatient Hospital Stay (HOSPITAL_BASED_OUTPATIENT_CLINIC_OR_DEPARTMENT_OTHER): Payer: No Typology Code available for payment source | Admitting: Hematology

## 2019-06-30 VITALS — BP 134/74 | HR 63 | Temp 97.7°F | Resp 17 | Ht 71.0 in | Wt 180.0 lb

## 2019-06-30 DIAGNOSIS — C250 Malignant neoplasm of head of pancreas: Secondary | ICD-10-CM

## 2019-06-30 DIAGNOSIS — Z5111 Encounter for antineoplastic chemotherapy: Secondary | ICD-10-CM | POA: Diagnosis not present

## 2019-06-30 DIAGNOSIS — I10 Essential (primary) hypertension: Secondary | ICD-10-CM | POA: Diagnosis not present

## 2019-06-30 LAB — CBC WITH DIFFERENTIAL (CANCER CENTER ONLY)
Abs Immature Granulocytes: 0.01 10*3/uL (ref 0.00–0.07)
Basophils Absolute: 0 10*3/uL (ref 0.0–0.1)
Basophils Relative: 1 %
Eosinophils Absolute: 0.3 10*3/uL (ref 0.0–0.5)
Eosinophils Relative: 7 %
HCT: 29.5 % — ABNORMAL LOW (ref 39.0–52.0)
Hemoglobin: 10.3 g/dL — ABNORMAL LOW (ref 13.0–17.0)
Immature Granulocytes: 0 %
Lymphocytes Relative: 42 %
Lymphs Abs: 1.7 10*3/uL (ref 0.7–4.0)
MCH: 30.4 pg (ref 26.0–34.0)
MCHC: 34.9 g/dL (ref 30.0–36.0)
MCV: 87 fL (ref 80.0–100.0)
Monocytes Absolute: 0.6 10*3/uL (ref 0.1–1.0)
Monocytes Relative: 16 %
Neutro Abs: 1.4 10*3/uL — ABNORMAL LOW (ref 1.7–7.7)
Neutrophils Relative %: 34 %
Platelet Count: 212 10*3/uL (ref 150–400)
RBC: 3.39 MIL/uL — ABNORMAL LOW (ref 4.22–5.81)
RDW: 13.4 % (ref 11.5–15.5)
WBC Count: 4 10*3/uL (ref 4.0–10.5)
nRBC: 0 % (ref 0.0–0.2)

## 2019-06-30 LAB — CMP (CANCER CENTER ONLY)
ALT: 27 U/L (ref 0–44)
AST: 24 U/L (ref 15–41)
Albumin: 3.4 g/dL — ABNORMAL LOW (ref 3.5–5.0)
Alkaline Phosphatase: 99 U/L (ref 38–126)
Anion gap: 11 (ref 5–15)
BUN: 10 mg/dL (ref 8–23)
CO2: 23 mmol/L (ref 22–32)
Calcium: 9.1 mg/dL (ref 8.9–10.3)
Chloride: 98 mmol/L (ref 98–111)
Creatinine: 0.99 mg/dL (ref 0.61–1.24)
GFR, Est AFR Am: >60 mL/min (ref >60)
GFR, Estimated: >60 mL/min (ref >60)
Glucose, Bld: 96 mg/dL (ref 70–99)
Potassium: 3.6 mmol/L (ref 3.5–5.1)
Sodium: 132 mmol/L — ABNORMAL LOW (ref 135–145)
Total Bilirubin: 3.2 mg/dL — ABNORMAL HIGH (ref 0.3–1.2)
Total Protein: 6.7 g/dL (ref 6.5–8.1)

## 2019-06-30 MED ORDER — SODIUM CHLORIDE 0.9 % IV SOLN
150.0000 mg | Freq: Once | INTRAVENOUS | Status: AC
  Administered 2019-06-30: 150 mg via INTRAVENOUS
  Filled 2019-06-30: qty 150

## 2019-06-30 MED ORDER — SODIUM CHLORIDE 0.9 % IV SOLN
1680.0000 mg/m2 | INTRAVENOUS | Status: DC
  Administered 2019-06-30: 3500 mg via INTRAVENOUS
  Filled 2019-06-30: qty 70

## 2019-06-30 MED ORDER — PALONOSETRON HCL INJECTION 0.25 MG/5ML
0.2500 mg | Freq: Once | INTRAVENOUS | Status: AC
  Administered 2019-06-30: 0.25 mg via INTRAVENOUS

## 2019-06-30 MED ORDER — SODIUM CHLORIDE 0.9 % IV SOLN
400.0000 mg/m2 | Freq: Once | INTRAVENOUS | Status: DC
  Filled 2019-06-30: qty 41.4

## 2019-06-30 MED ORDER — SODIUM CHLORIDE 0.9 % IV SOLN
10.0000 mg | Freq: Once | INTRAVENOUS | Status: AC
  Administered 2019-06-30: 10 mg via INTRAVENOUS
  Filled 2019-06-30: qty 10

## 2019-06-30 MED ORDER — SODIUM CHLORIDE 0.9 % IV SOLN
1680.0000 mg/m2 | INTRAVENOUS | Status: DC
  Filled 2019-06-30: qty 70

## 2019-06-30 MED ORDER — PALONOSETRON HCL INJECTION 0.25 MG/5ML
INTRAVENOUS | Status: AC
  Filled 2019-06-30: qty 5

## 2019-06-30 MED ORDER — SODIUM CHLORIDE 0.9 % IV SOLN
110.0000 mg/m2 | Freq: Once | INTRAVENOUS | Status: DC
  Filled 2019-06-30: qty 11

## 2019-06-30 MED ORDER — OXALIPLATIN CHEMO INJECTION 100 MG/20ML
72.0000 mg/m2 | Freq: Once | INTRAVENOUS | Status: AC
  Administered 2019-06-30: 150 mg via INTRAVENOUS
  Filled 2019-06-30: qty 20

## 2019-06-30 MED ORDER — DEXTROSE 5 % IV SOLN
Freq: Once | INTRAVENOUS | Status: AC
  Filled 2019-06-30: qty 250

## 2019-06-30 MED ORDER — LEUCOVORIN CALCIUM INJECTION 350 MG
400.0000 mg/m2 | Freq: Once | INTRAVENOUS | Status: AC
  Administered 2019-06-30: 808 mg via INTRAVENOUS
  Filled 2019-06-30: qty 40.4

## 2019-06-30 NOTE — Patient Instructions (Signed)
[facility] Discharge Instructions for Patients Receiving Chemotherapy  Today you received the following chemotherapy agents: Oxaliplatin, leucovorin, 5FU   To help prevent nausea and vomiting after your treatment, we encourage you to take your nausea medication as directed.    If you develop nausea and vomiting that is not controlled by your nausea medication, call the clinic.   BELOW ARE SYMPTOMS THAT SHOULD BE REPORTED IMMEDIATELY:  *FEVER GREATER THAN 100.5 F  *CHILLS WITH OR WITHOUT FEVER  NAUSEA AND VOMITING THAT IS NOT CONTROLLED WITH YOUR NAUSEA MEDICATION  *UNUSUAL SHORTNESS OF BREATH  *UNUSUAL BRUISING OR BLEEDING  TENDERNESS IN MOUTH AND THROAT WITH OR WITHOUT PRESENCE OF ULCERS  *URINARY PROBLEMS  *BOWEL PROBLEMS  UNUSUAL RASH Items with * indicate a potential emergency and should be followed up as soon as possible.  Feel free to call the clinic should you have any questions or concerns. The clinic phone number is [phone].  Please show the CHEMO ALERT CARD at check-in to the Emergency Department and triage nurse.   
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
## 2019-06-30 NOTE — Progress Notes (Signed)
Per MD, Dose is reduced from normal 2400mg /m2, however MD is increasing dose from last treatment from 1000mg /m2 to 1680mg /m2.

## 2019-06-30 NOTE — Progress Notes (Signed)
Okay to treat with today's labs per Dr. Burr Medico and she will dose reduce the treatment and spoke with Teldrin in pharmacy regarding dose reduction.

## 2019-07-01 ENCOUNTER — Other Ambulatory Visit: Payer: Self-pay

## 2019-07-01 ENCOUNTER — Inpatient Hospital Stay: Payer: No Typology Code available for payment source

## 2019-07-01 VITALS — BP 129/70 | HR 70 | Temp 98.4°F | Resp 18

## 2019-07-01 DIAGNOSIS — Z5111 Encounter for antineoplastic chemotherapy: Secondary | ICD-10-CM | POA: Diagnosis not present

## 2019-07-01 DIAGNOSIS — C250 Malignant neoplasm of head of pancreas: Secondary | ICD-10-CM

## 2019-07-01 MED ORDER — ATROPINE SULFATE 1 MG/ML IJ SOLN: 0.5000 mg | Freq: Once | INTRAMUSCULAR | Status: DC | PRN

## 2019-07-01 MED ORDER — SODIUM CHLORIDE 0.9 % IV SOLN
10.0000 mg | Freq: Once | INTRAVENOUS | Status: AC
  Administered 2019-07-01: 10 mg via INTRAVENOUS
  Filled 2019-07-01: qty 10

## 2019-07-01 MED ORDER — SODIUM CHLORIDE 0.9 % IV SOLN
Freq: Once | INTRAVENOUS | Status: AC
  Filled 2019-07-01: qty 250

## 2019-07-01 MED ORDER — SODIUM CHLORIDE 0.9 % IV SOLN
110.0000 mg/m2 | Freq: Once | INTRAVENOUS | Status: AC
  Administered 2019-07-01: 220 mg via INTRAVENOUS
  Filled 2019-07-01: qty 11

## 2019-07-01 MED ORDER — ATROPINE SULFATE 0.4 MG/ML IJ SOLN
INTRAMUSCULAR | Status: AC
  Filled 2019-07-01: qty 1

## 2019-07-01 MED ORDER — ATROPINE SULFATE 0.4 MG/ML IJ SOLN
0.4000 mg | Freq: Once | INTRAMUSCULAR | Status: AC | PRN
  Administered 2019-07-01: 0.4 mg via INTRAVENOUS

## 2019-07-01 NOTE — Progress Notes (Signed)
Late note entry: Patient was unable to receive the whole FOLFOXIRI treatment on 5/27 due to system wide downtime. Patient received Oxaliplatin and leucovorin and was connected to 5FU pump over 41.5 hours and will return on 5/28 to receive Irinotecan via PIV. This plan was discussed with Arbie Cookey, pharmacist, and Dr. Burr Medico. Patient aware and provided an updated schedule with appointments.

## 2019-07-02 ENCOUNTER — Encounter: Payer: Self-pay | Admitting: Hematology

## 2019-07-02 ENCOUNTER — Inpatient Hospital Stay: Payer: No Typology Code available for payment source

## 2019-07-02 VITALS — BP 131/83 | HR 69 | Temp 98.3°F | Resp 18

## 2019-07-02 DIAGNOSIS — C250 Malignant neoplasm of head of pancreas: Secondary | ICD-10-CM

## 2019-07-02 DIAGNOSIS — Z5111 Encounter for antineoplastic chemotherapy: Secondary | ICD-10-CM | POA: Diagnosis not present

## 2019-07-02 MED ORDER — SODIUM CHLORIDE 0.9% FLUSH
10.0000 mL | INTRAVENOUS | Status: DC | PRN
  Administered 2019-07-02: 10 mL
  Filled 2019-07-02: qty 10

## 2019-07-02 MED ORDER — PEGFILGRASTIM-CBQV 6 MG/0.6ML ~~LOC~~ SOSY
6.0000 mg | PREFILLED_SYRINGE | Freq: Once | SUBCUTANEOUS | Status: AC
  Administered 2019-07-02: 6 mg via SUBCUTANEOUS

## 2019-07-02 MED ORDER — HEPARIN SOD (PORK) LOCK FLUSH 100 UNIT/ML IV SOLN
500.0000 [IU] | Freq: Once | INTRAVENOUS | Status: AC | PRN
  Administered 2019-07-02: 500 [IU]
  Filled 2019-07-02: qty 5

## 2019-07-05 ENCOUNTER — Ambulatory Visit: Payer: No Typology Code available for payment source | Admitting: Nurse Practitioner

## 2019-07-05 ENCOUNTER — Other Ambulatory Visit: Payer: No Typology Code available for payment source

## 2019-07-05 ENCOUNTER — Ambulatory Visit: Payer: No Typology Code available for payment source

## 2019-07-05 LAB — CANCER ANTIGEN 19-9: CA 19-9: 7613 U/mL — ABNORMAL HIGH (ref 0–35)

## 2019-07-06 ENCOUNTER — Other Ambulatory Visit: Payer: Self-pay | Admitting: Genetic Counselor

## 2019-07-06 DIAGNOSIS — C250 Malignant neoplasm of head of pancreas: Secondary | ICD-10-CM

## 2019-07-07 ENCOUNTER — Other Ambulatory Visit: Payer: Self-pay

## 2019-07-07 ENCOUNTER — Inpatient Hospital Stay: Payer: No Typology Code available for payment source | Attending: Hematology | Admitting: Genetic Counselor

## 2019-07-07 ENCOUNTER — Encounter: Payer: Self-pay | Admitting: Genetic Counselor

## 2019-07-07 ENCOUNTER — Inpatient Hospital Stay: Payer: No Typology Code available for payment source

## 2019-07-07 DIAGNOSIS — Z5189 Encounter for other specified aftercare: Secondary | ICD-10-CM | POA: Insufficient documentation

## 2019-07-07 DIAGNOSIS — C250 Malignant neoplasm of head of pancreas: Secondary | ICD-10-CM | POA: Diagnosis not present

## 2019-07-07 DIAGNOSIS — Z5111 Encounter for antineoplastic chemotherapy: Secondary | ICD-10-CM | POA: Insufficient documentation

## 2019-07-07 DIAGNOSIS — Z452 Encounter for adjustment and management of vascular access device: Secondary | ICD-10-CM | POA: Insufficient documentation

## 2019-07-07 DIAGNOSIS — I10 Essential (primary) hypertension: Secondary | ICD-10-CM | POA: Insufficient documentation

## 2019-07-07 DIAGNOSIS — E876 Hypokalemia: Secondary | ICD-10-CM | POA: Insufficient documentation

## 2019-07-07 DIAGNOSIS — C258 Malignant neoplasm of overlapping sites of pancreas: Secondary | ICD-10-CM | POA: Insufficient documentation

## 2019-07-07 DIAGNOSIS — R197 Diarrhea, unspecified: Secondary | ICD-10-CM | POA: Insufficient documentation

## 2019-07-07 NOTE — Progress Notes (Signed)
REFERRING PROVIDER: Truitt Merle, MD [place],  [place] [address]  PRIMARY PROVIDER:  System, Pcp Not In  PRIMARY REASON FOR VISIT:  1. Malignant neoplasm of head of pancreas ([place])      HISTORY OF PRESENT ILLNESS:   Isaac Doyle, a 64 y.o. male, was seen for a [place] cancer genetics consultation at the request of Dr. Burr Medico due to a personal history of pancreatic cancer. Isaac Doyle presents to clinic today to discuss the possibility of a hereditary predisposition to cancer, genetic testing, and to further clarify his future cancer risks, as well as potential cancer risks for family members.   In 2021, at the age of 39, Isaac Doyle was diagnosed with pancreatic adenocarcinoma. The treatment plan includes chemotherapy and surgery.    CANCER HISTORY:  Oncology History Overview Note  Cancer Staging Malignant neoplasm of pancreas [facility]) Staging form: Exocrine Pancreas, AJCC 8th Edition - Clinical stage from 05/19/2019: Stage III (cT4, cN1, cM0) - Signed by Truitt Merle, MD on 05/24/2019    Malignant neoplasm of pancreas ([place])  05/10/2019 Tumor Marker   Baseline  CEA at 31.1 Ca 19-9 at 2411   05/11/2019 Procedure   ERCP by Dr Henrene Pastor 05/11/19  IMPRESSION 1. Malignant appearing distal bile duct stricture with upstream dilation. Status post ERCP with sphincterotomy and biliary stent placement   05/13/2019 Imaging   CT Chest and Pancreas 05/13/19 IMPRESSION: 1. Interval placement of common bile duct stent with decompression of the bile ducts. Pneumobilia is now noted compatible with biliary patency. 2. Diffuse infiltrative process involving the head, neck, body and tail of pancreas is identified. The diffusely infiltrative appearance of the pancreas is somewhat unusual. Although favored to represent pancreatic adenocarcinoma, other etiologies to consider include IgG4-related Sclerosing Disease of the pancreas. 3. There is encasement and narrowing of the portal venous  confluence and proximal portal vein. Mild soft tissue stranding extends to but does not encase the superior mesenteric artery. No convincing evidence for involvement of the celiac trunk. 4. Borderline enlarged portacaval node. No convincing evidence for liver metastasis or metastatic disease to the chest. 5. Aortic atherosclerosis. Aortic Atherosclerosis (ICD10-I70.0).   05/19/2019 Initial Diagnosis   Malignant neoplasm of pancreas ([place])   05/19/2019 Cancer Staging   Staging form: Exocrine Pancreas, AJCC 8th Edition - Clinical stage from 05/19/2019: Stage III (cT4, cN1, cM0) - Signed by Truitt Merle, MD on 05/24/2019   05/19/2019 Procedure   EUS by Dr Ardis Hughs 05/19/19 - Large mass involving much of the pancreatic parenchyma, clear encasing the PV and possibly involving the SMA as well. The mass was sampled with 3 transduodenal EUS FNB passes and the preliminary cytology was positive for malignancy (adenocarcinoma). - Previously placed plastic biliary stent was in the CBD in good position.    05/19/2019 Initial Biopsy   A. 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   06/08/2019 Procedure   PAC placed    06/16/2019 -  Chemotherapy   FOLFIRINOX q2weeks starting 06/16/19     Past Medical History:  Diagnosis Date  . Hypertension     Past Surgical History:  Procedure Laterality Date  . BILIARY STENT PLACEMENT  05/11/2019   Procedure: BILIARY STENT PLACEMENT;  Surgeon: Irene Shipper, MD;  Location: [facility] ENDOSCOPY;  Service: Endoscopy;;  . BILIARY  STENT PLACEMENT  06/03/2019   Procedure: BILIARY STENT PLACEMENT;  Surgeon: Jackquline Denmark, MD;  Location[facility] ENDOSCOPY;  Service: Endoscopy;;  . ERCP N/A 05/11/2019   Procedure: ENDOSCOPIC RETROGRADE CHOLANGIOPANCREATOGRAPHY (ERCP);  Surgeon:  Irene Shipper, MD;  Location: [facility] ENDOSCOPY;  Service: Endoscopy;  Laterality: N/A;  with   . ERCP N/A 06/03/2019   Procedure: ENDOSCOPIC RETROGRADE CHOLANGIOPANCREATOGRAPHY (ERCP);  Surgeon: Jackquline Denmark, MD;  Location[facility] ENDOSCOPY;  Service: Endoscopy;  Laterality: N/A;  . ESOPHAGOGASTRODUODENOSCOPY (EGD) WITH PROPOFOL N/A 05/19/2019   Procedure: ESOPHAGOGASTRODUODENOSCOPY (EGD) WITH PROPOFOL;  Surgeon: [name], MD;  Location: [facility] ENDOSCOPY;  Service: Endoscopy;  Laterality: N/A;  . EUS N/A 05/19/2019   Procedure: UPPER ENDOSCOPIC ULTRASOUND (EUS) LINEAR;  Surgeon: [name], MD;  Location: [facility] ENDOSCOPY;  Service: Endoscopy;  Laterality: N/A;  . FINE NEEDLE ASPIRATION N/A 05/19/2019   Procedure: FINE NEEDLE ASPIRATION (FNA) LINEAR;  Surgeon: [name], MD;  Location: [facility] ENDOSCOPY;  Service: Endoscopy;  Laterality: N/A;  . IR IMAGING GUIDED PORT INSERTION  06/08/2019  . SPHINCTEROTOMY  05/11/2019   Procedure: SPHINCTEROTOMY;  Surgeon: Irene Shipper, MD;  Location: [facility] ENDOSCOPY;  Service: Endoscopy;;  . [name] REMOVAL  06/03/2019   Procedure: STENT REMOVAL;  Surgeon: Jackquline Denmark, MD;  Location: [facility] ENDOSCOPY;  Service: Endoscopy;;    Social History   Socioeconomic History  . Marital status: Divorced    Spouse name: Not on file  . Number of children: 1  . Years of education: Not on file  . Highest education level: Not on file  Occupational History  . Occupation: disabled veteran   Tobacco Use  . Smoking status: Never Smoker  . Smokeless tobacco: Never Used  . Tobacco comment: plan to quit completely   Substance and Sexual Activity  . Alcohol use: Yes    Alcohol/week: 22.0 standard drinks    Types: 12 Cans of beer, 10 Shots of liquor per week  . Drug use: Not Currently  . Sexual activity: Not on file  Other Topics Concern  . Not on file  Social History Narrative  . Not on file   Social Determinants of Health   Financial Resource Strain:   . Difficulty of Paying  Living Expenses:   Food Insecurity:   . Worried About Charity fundraiser in the Last Year:   . Arboriculturist in the Last Year:   Transportation Needs:   . Film/video editor (Medical):   Marland Kitchen Lack of Transportation (Non-Medical):   Physical Activity:   . Days of Exercise per Week:   . Minutes of Exercise per Session:   Stress:   . Feeling of Stress :   Social Connections:   . Frequency of Communication with Friends and Family:   . Frequency of Social Gatherings with Friends and Family:   . Attends Religious Services:   . Active Member of Clubs or Organizations:   . Attends Archivist Meetings:   Marland Kitchen Marital Status:      FAMILY HISTORY:  We obtained a detailed, 4-generation family history.  Significant diagnoses are listed below: Family History  Problem Relation Age of Onset  . Lymphoma Mother 69       cancer in lymph nodes, unsure of primary  . Heart attack Father   . Lupus Sister    Isaac Doyle has one daughter (age 12) and two grandchildren. He has two living sisters (ages 38 and 55), one brother (age 4), and another sister  who died in her late 59s from lupus. None of these family members have had cancer.  Isaac Doyle mother died at the age of 32 from cancer in her lymph nodes (he is unsure of the primary), initially diagnosed when she was 61. He had two maternal aunts and seven maternal uncles, none of whom had cancer. His maternal grandmother died in her late 36s and did not have cancer, and his maternal grandfather died in his 30s and did not have cancer. There are no other known diagnoses of cancer on the maternal side of the family.  Isaac Doyle father died at the age of 12 from a heart attack. He had four paternal aunts and four paternal uncles, none of whom have had cancer. His paternal grandmother died in her 53s and did not have cancer, and his paternal grandfather died in his 31s and did not have cancer. There are no known diagnoses of cancer on the paternal  side of the family.  Isaac Doyle is unaware of previous family history of genetic testing for hereditary cancer risks. He does not know his ancestry. There is no reported Ashkenazi Jewish ancestry. There is no known consanguinity.  GENETIC COUNSELING ASSESSMENT:  Isaac Doyle is a 64 y.o. male with a personal history of pancreatic cancer, which is somewhat suggestive of a hereditary cancer syndrome and predisposition to cancer. We, therefore, discussed and recommended the following at today's visit.   DISCUSSION: We discussed that approximately 2 - 5% of unselected cases of pancreatic cancer are hereditary. Most cases of hereditary pancreatic cancer are associated with the BRCA genes, although there are other genes that can be associated with hereditary pancreatic cancer syndromes. These include APC, CDKN2A, ATM, etc. We discussed that testing can be beneficial for several reasons, including identifying whether targeted treatment options such as PARP inhibitors would be beneficial, knowing about other cancer risks, identifying potential screening and risk-reduction options that may be appropriate, and to understand if other family members could be at risk for cancer and allow them to undergo genetic testing.   We reviewed the characteristics, features and inheritance patterns of hereditary cancer syndromes. We also discussed genetic testing, including the appropriate family members to test, the process of testing, insurance coverage and turn-around-time for results. We discussed the implications of a negative, positive and/or variant of uncertain significant result. We recommended Isaac Doyle pursue genetic testing for the [facility] Common Hereditary Cancers panel.   The Common Hereditary Cancers Panel offered by [facility] includes sequencing and/or deletion duplication testing of the following 48 genes: APC, ATM, AXIN2, BARD1, BMPR1A, BRCA1, BRCA2, BRIP1, CDH1, CDK4, CDKN2A (p14ARF), CDKN2A (p16INK4a), CHEK2,  CTNNA1, DICER1, EPCAM (Deletion/duplication testing only), GREM1 (promoter region deletion/duplication testing only), KIT, MEN1, MLH1, MSH2, MSH3, MSH6, MUTYH, NBN, NF1, NHTL1, PALB2, PDGFRA, PMS2, POLD1, POLE, PTEN, RAD50, RAD51C, RAD51D, RNF43, SDHB, SDHC, SDHD, SMAD4, SMARCA4. STK11, TP53, TSC1, TSC2, and VHL.  The following genes are evaluated for sequence changes only: SDHA and HOXB13 c.251G>A variant only.  Based on Isaac Doyle personal history of pancreatic cancer, he meets medical criteria for genetic testing. Despite that he meets criteria, he may still have an out of pocket cost. We discussed that if his out of pocket cost for testing is over $100, the laboratory will reach out to let him know. If the out of pocket cost of testing is less than $100 he will be billed by the genetic testing laboratory.   PLAN:  Isaac Doyle did not wish to pursue genetic testing  at today's visit, stating that he would like to discuss genetic testing with his daughter before proceeding. He will call us once he has made a decision. We, therefore, recommend Isaac Doyle continue to follow the cancer screening guidelines given by his primary healthcare provider.  Isaac Doyle questions were answered to his satisfaction today. Our contact information was provided should additional questions or concerns arise. Thank you for the referral and allowing [place] to share in the care of your patient.   Clint Guy, [place], [facility] Licensed, Certified Dispensing [email or web address] Phone: [phone]  The patient was seen for a total of 35 minutes in face-to-face genetic counseling.  This patient was discussed with Drs. Magrinat, Lindi Adie and/or Burr Medico who agrees with the above.    _______________________________________________________________________ For Office Staff:  Number of people involved in session: 1 Was an Intern/ student involved with case: no

## 2019-07-13 MED FILL — Dexamethasone Sodium Phosphate Inj 100 MG/10ML: INTRAMUSCULAR | Qty: 1 | Status: AC

## 2019-07-13 MED FILL — Fosaprepitant Dimeglumine For IV Infusion 150 MG (Base Eq): INTRAVENOUS | Qty: 5 | Status: AC
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
## 2019-07-14 ENCOUNTER — Other Ambulatory Visit: Payer: Self-pay

## 2019-07-14 ENCOUNTER — Inpatient Hospital Stay: Payer: No Typology Code available for payment source

## 2019-07-14 ENCOUNTER — Encounter: Payer: Self-pay | Admitting: Hematology

## 2019-07-14 ENCOUNTER — Inpatient Hospital Stay (HOSPITAL_BASED_OUTPATIENT_CLINIC_OR_DEPARTMENT_OTHER): Payer: No Typology Code available for payment source | Admitting: Hematology

## 2019-07-14 ENCOUNTER — Inpatient Hospital Stay: Payer: No Typology Code available for payment source | Admitting: Nutrition

## 2019-07-14 VITALS — BP 124/69 | HR 61 | Temp 97.5°F | Resp 18 | Ht 71.0 in | Wt 168.2 lb

## 2019-07-14 DIAGNOSIS — I10 Essential (primary) hypertension: Secondary | ICD-10-CM | POA: Diagnosis not present

## 2019-07-14 DIAGNOSIS — Z95828 Presence of other vascular implants and grafts: Secondary | ICD-10-CM

## 2019-07-14 DIAGNOSIS — R197 Diarrhea, unspecified: Secondary | ICD-10-CM | POA: Diagnosis not present

## 2019-07-14 DIAGNOSIS — C250 Malignant neoplasm of head of pancreas: Secondary | ICD-10-CM | POA: Diagnosis not present

## 2019-07-14 DIAGNOSIS — Z452 Encounter for adjustment and management of vascular access device: Secondary | ICD-10-CM | POA: Diagnosis not present

## 2019-07-14 DIAGNOSIS — E876 Hypokalemia: Secondary | ICD-10-CM | POA: Diagnosis not present

## 2019-07-14 DIAGNOSIS — Z5189 Encounter for other specified aftercare: Secondary | ICD-10-CM | POA: Diagnosis not present

## 2019-07-14 DIAGNOSIS — Z5111 Encounter for antineoplastic chemotherapy: Secondary | ICD-10-CM | POA: Diagnosis present

## 2019-07-14 DIAGNOSIS — C258 Malignant neoplasm of overlapping sites of pancreas: Secondary | ICD-10-CM | POA: Diagnosis present

## 2019-07-14 LAB — CBC WITH DIFFERENTIAL (CANCER CENTER ONLY)
Abs Immature Granulocytes: 0.07 10*3/uL (ref 0.00–0.07)
Basophils Absolute: 0 10*3/uL (ref 0.0–0.1)
Basophils Relative: 0 %
Eosinophils Absolute: 0.1 10*3/uL (ref 0.0–0.5)
Eosinophils Relative: 1 %
HCT: 30.8 % — ABNORMAL LOW (ref 39.0–52.0)
Hemoglobin: 10.9 g/dL — ABNORMAL LOW (ref 13.0–17.0)
Immature Granulocytes: 1 %
Lymphocytes Relative: 27 %
Lymphs Abs: 2.5 10*3/uL (ref 0.7–4.0)
MCH: 30.2 pg (ref 26.0–34.0)
MCHC: 35.4 g/dL (ref 30.0–36.0)
MCV: 85.3 fL (ref 80.0–100.0)
Monocytes Absolute: 0.7 10*3/uL (ref 0.1–1.0)
Monocytes Relative: 8 %
Neutro Abs: 5.6 10*3/uL (ref 1.7–7.7)
Neutrophils Relative %: 63 %
Platelet Count: 238 10*3/uL (ref 150–400)
RBC: 3.61 MIL/uL — ABNORMAL LOW (ref 4.22–5.81)
RDW: 14 % (ref 11.5–15.5)
WBC Count: 9.1 10*3/uL (ref 4.0–10.5)
nRBC: 0 % (ref 0.0–0.2)

## 2019-07-14 LAB — CMP (CANCER CENTER ONLY)
ALT: 50 U/L — ABNORMAL HIGH (ref 0–44)
AST: 42 U/L — ABNORMAL HIGH (ref 15–41)
Albumin: 3.4 g/dL — ABNORMAL LOW (ref 3.5–5.0)
Alkaline Phosphatase: 135 U/L — ABNORMAL HIGH (ref 38–126)
Anion gap: 11 (ref 5–15)
BUN: 12 mg/dL (ref 8–23)
CO2: 23 mmol/L (ref 22–32)
Calcium: 9.4 mg/dL (ref 8.9–10.3)
Chloride: 98 mmol/L (ref 98–111)
Creatinine: 1.33 mg/dL — ABNORMAL HIGH (ref 0.61–1.24)
GFR, Est AFR Am: >60 mL/min (ref >60)
GFR, Estimated: 56 mL/min — ABNORMAL LOW (ref >60)
Glucose, Bld: 104 mg/dL — ABNORMAL HIGH (ref 70–99)
Potassium: 3.1 mmol/L — ABNORMAL LOW (ref 3.5–5.1)
Sodium: 132 mmol/L — ABNORMAL LOW (ref 135–145)
Total Bilirubin: 2.2 mg/dL — ABNORMAL HIGH (ref 0.3–1.2)
Total Protein: 7 g/dL (ref 6.5–8.1)

## 2019-07-14 MED ORDER — POTASSIUM CHLORIDE CRYS ER 20 MEQ PO TBCR
20.0000 meq | EXTENDED_RELEASE_TABLET | Freq: Every day | ORAL | 1 refills | Status: DC
Start: 2019-07-14 — End: 2019-08-10

## 2019-07-14 MED ORDER — LOPERAMIDE HCL 2 MG PO CAPS: 2.0000 mg | ORAL_CAPSULE | Freq: Four times a day (QID) | ORAL | 1 refills | Status: DC | PRN

## 2019-07-14 MED ORDER — POTASSIUM CHLORIDE CRYS ER 20 MEQ PO TBCR
EXTENDED_RELEASE_TABLET | ORAL | Status: AC
  Filled 2019-07-14: qty 1

## 2019-07-14 MED ORDER — LOPERAMIDE HCL 2 MG PO CAPS
4.0000 mg | ORAL_CAPSULE | Freq: Once | ORAL | Status: AC
  Administered 2019-07-14: 4 mg via ORAL

## 2019-07-14 MED ORDER — SODIUM CHLORIDE 0.9% FLUSH
10.0000 mL | Freq: Once | INTRAVENOUS | Status: AC
  Administered 2019-07-14: 10 mL via INTRAVENOUS
  Filled 2019-07-14: qty 10

## 2019-07-14 MED ORDER — SODIUM CHLORIDE 0.9% FLUSH
10.0000 mL | INTRAVENOUS | Status: DC | PRN
  Administered 2019-07-14: 10 mL via INTRAVENOUS
  Filled 2019-07-14: qty 10

## 2019-07-14 MED ORDER — SODIUM CHLORIDE 0.9 % IV SOLN
INTRAVENOUS | Status: DC
  Filled 2019-07-14 (×2): qty 1000

## 2019-07-14 MED ORDER — SODIUM CHLORIDE 0.9 % IV SOLN
INTRAVENOUS | Status: DC
  Filled 2019-07-14: qty 250

## 2019-07-14 MED ORDER — LOPERAMIDE HCL 2 MG PO CAPS
ORAL_CAPSULE | ORAL | Status: AC
  Filled 2019-07-14: qty 2

## 2019-07-14 MED ORDER — HEPARIN SOD (PORK) LOCK FLUSH 100 UNIT/ML IV SOLN
500.0000 [IU] | Freq: Once | INTRAVENOUS | Status: AC
  Administered 2019-07-14: 500 [IU] via INTRAVENOUS
  Filled 2019-07-14: qty 5

## 2019-07-14 MED ORDER — POTASSIUM CHLORIDE CRYS ER 20 MEQ PO TBCR
20.0000 meq | EXTENDED_RELEASE_TABLET | Freq: Once | ORAL | Status: AC
  Administered 2019-07-14: 20 meq via ORAL

## 2019-07-14 MED ORDER — DIPHENOXYLATE-ATROPINE 2.5-0.025 MG PO TABS: 1.0000 | ORAL_TABLET | Freq: Four times a day (QID) | ORAL | 0 refills | Status: DC | PRN

## 2019-07-14 NOTE — Patient Instructions (Signed)
Rehydration, Adult Rehydration is the replacement of body fluids and salts and minerals (electrolytes) that are lost during dehydration. Dehydration is when there is not enough fluid or water in the body. This happens when you lose more fluids than you take in. Common causes of dehydration include:  Vomiting.  Diarrhea.  Excessive sweating, such as from heat exposure or exercise.  Taking medicines that cause the body to lose excess fluid (diuretics).  Impaired kidney function.  Not drinking enough fluid.  Certain illnesses or infections.  Certain poorly controlled long-term (chronic) illnesses, such as diabetes, heart disease, and kidney disease.  Symptoms of mild dehydration may include thirst, dry lips and mouth, dry skin, and dizziness. Symptoms of severe dehydration may include increased heart rate, confusion, fainting, and not urinating. You can rehydrate by drinking certain fluids or getting fluids through an IV tube, as told by your health care provider. What are the risks? Generally, rehydration is safe. However, one problem that can happen is taking in too much fluid (overhydration). This is rare. If overhydration happens, it can cause an electrolyte imbalance, kidney failure, or a decrease in salt (sodium) levels in the body. How to rehydrate Follow instructions from your health care provider for rehydration. The kind of fluid you should drink and the amount you should drink depend on your condition.  If directed by your health care provider, drink an oral rehydration solution (ORS). This is a drink designed to treat dehydration that is found in pharmacies and retail stores. ? Make an ORS by following instructions on the package. ? Start by drinking small amounts, about  cup (120 mL) every 5-10 minutes. ? Slowly increase how much you drink until you have taken the amount recommended by your health care provider.  Drink enough clear fluids to keep your urine clear or pale  yellow. If you were instructed to drink an ORS, finish the ORS first, then start slowly drinking other clear fluids. Drink fluids such as: ? Water. Do not drink only water. Doing that can lead to having too little sodium in your body (hyponatremia). ? Ice chips. ? Fruit juice that you have added water to (diluted juice). ? Low-calorie sports drinks.  If you are severely dehydrated, your health care provider may recommend that you receive fluids through an IV tube in the hospital.  Do not take sodium tablets. Doing that can lead to the condition of having too much sodium in your body (hypernatremia). Eating while you rehydrate Follow instructions from your health care provider about what to eat while you rehydrate. Your health care provider may recommend that you slowly begin eating regular foods in small amounts.  Eat foods that contain a healthy balance of electrolytes, such as bananas, oranges, potatoes, tomatoes, and spinach.  Avoid foods that are greasy or contain a lot of fat or sugar.  In some cases, you may get nutrition through a feeding tube that is passed through your nose and into your stomach (nasogastric tube, or NG tube). This may be done if you have uncontrolled vomiting or diarrhea. Beverages to avoid Certain beverages may make dehydration worse. While you rehydrate, avoid:  Alcohol.  Caffeine.  Drinks that contain a lot of sugar. These include: ? High-calorie sports drinks. ? Fruit juice that is not diluted. ? Soda.  Check nutrition labels to see how much sugar or caffeine a beverage contains. Signs of dehydration recovery You may be recovering from dehydration if:  You are urinating more often than before you started   rehydrating.  Your urine is clear or pale yellow.  Your energy level improves.  You vomit less frequently.  You have diarrhea less frequently.  Your appetite improves or returns to normal.  You feel less dizzy or less light-headed.  Your  skin tone and color start to look more normal. Contact a health care provider if:  You continue to have symptoms of mild dehydration, such as: ? Thirst. ? Dry lips. ? Slightly dry mouth. ? Dry, warm skin. ? Dizziness.  You continue to vomit or have diarrhea. Get help right away if:  You have symptoms of dehydration that get worse.  You feel: ? Confused. ? Weak. ? Like you are going to faint.  You have not urinated in 6-8 hours.  You have very dark urine.  You have trouble breathing.  Your heart rate while sitting still is over 100 beats a minute.  You cannot drink fluids without vomiting.  You have vomiting or diarrhea that: ? Gets worse. ? Does not go away.  You have a fever. This information is not intended to replace advice given to you by your health care provider. Make sure you discuss any questions you have with your health care provider. Document Revised: 01/02/2017 Document Reviewed: 03/16/2015 Elsevier Patient Education  2020 Else[employer] Inc.   Hypokalemia Hypokalemia means that the amount of potassium in the blood is lower than normal. Potassium is a chemical (electrolyte) that helps regulate the amount of fluid in the body. It also stimulates muscle tightening (contraction) and helps nerves work properly. Normally, most of the body's potassium is inside cells, and only a very small amount is in the blood. Because the amount in the blood is so small, minor changes to potassium levels in the blood can be life-threatening. What are the causes? This condition may be caused by:  Antibiotic medicine.  Diarrhea or vomiting. Taking too much of a medicine that helps you have a bowel movement (laxative) can cause diarrhea and lead to hypokalemia.  Chronic kidney disease (CKD).  Medicines that help the body get rid of excess fluid (diuretics).  Eating disorders, such as bulimia.  Low magnesium levels in the body.  Sweating a lot. What are the signs or  symptoms? Symptoms of this condition include:  Weakness.  Constipation.  Fatigue.  Muscle cramps.  Mental confusion.  Skipped heartbeats or irregular heartbeat (palpitations).  Tingling or numbness. How is this diagnosed? This condition is diagnosed with a blood test. How is this treated? This condition may be treated by:  Taking potassium supplements by mouth.  Adjusting the medicines that you take.  Eating more foods that contain a lot of potassium. If your potassium level is very low, you may need to get potassium through an IV and be monitored in the hospital. Follow these instructions at home:   Take over-the-counter and prescription medicines only as told by your health care provider. This includes vitamins and supplements.  Eat a healthy diet. A healthy diet includes fresh fruits and vegetables, whole grains, healthy fats, and lean proteins.  If instructed, eat more foods that contain a lot of potassium. This includes: ? Nuts, such as peanuts and pistachios. ? Seeds, such as sunflower seeds and pumpkin seeds. ? Peas, lentils, and lima beans. ? Whole grain and bran cereals and breads. ? Fresh fruits and vegetables, such as apricots, avocado, bananas, cantaloupe, kiwi, oranges, tomatoes, asparagus, and potatoes. ? Orange juice. ? Tomato juice. ? Red meats. ? Yogurt.  Keep all follow-up visits   as told by your health care provider. This is important. Contact a health care provider if you:  Have weakness that gets worse.  Feel your heart pounding or racing.  Vomit.  Have diarrhea.  Have diabetes (diabetes mellitus) and you have trouble keeping your blood sugar (glucose) in your target range. Get help right away if you:  Have chest pain.  Have shortness of breath.  Have vomiting or diarrhea that lasts for more than 2 days.  Faint. Summary  Hypokalemia means that the amount of potassium in the blood is lower than normal.  This condition is  diagnosed with a blood test.  Hypokalemia may be treated by taking potassium supplements, adjusting the medicines that you take, or eating more foods that are high in potassium.  If your potassium level is very low, you may need to get potassium through an IV and be monitored in the hospital. This information is not intended to replace advice given to you by your health care provider. Make sure you discuss any questions you have with your health care provider. Document Revised: 09/02/2017 Document Reviewed: 09/02/2017 Elsevier Patient Education  2020 Elsevier Inc.   

## 2019-07-14 NOTE — Progress Notes (Signed)
Nutrition follow-up completed with patient during infusion for pancreas cancer. Weight was decreased and documented as 168.2 pounds on June 10 down from 188.5 pounds in April 2021. Patient reports his appetite is okay. Reports that he has diarrhea excessively and everything he eats " goes right through". Chemotherapy is on hold today and he is being given IV fluids with Imodium and Lomotil prescriptions. Patient tolerated ginger ale during IV fluids. Noted labs: Sodium 132, potassium 3.1, glucose 104, and creatinine 1.33.  Nutrition diagnosis: Inadequate oral intake continues.  Intervention: Educated patient on low fiber diet to help improve diarrhea. Provided fact sheet. Encourage patient to increase fluids. Recommended 4 ounces of Ensure Enlive slowly over 15 minutes several times throughout the day. Questions were answered.  Teach back method used.  Contact information provided.  Monitoring, evaluation, goals: Patient will tolerate adequate calories and protein to minimize further weight loss while improving diarrhea.  Next visit: Thursday, July 1 during infusion.  **Disclaimer: This note was dictated with voice recognition software. Similar sounding words can inadvertently be transcribed and this note may contain transcription errors which may not have been corrected upon publication of note.**

## 2019-07-16 ENCOUNTER — Inpatient Hospital Stay: Payer: No Typology Code available for payment source

## 2019-07-18 NOTE — Progress Notes (Signed)
Patient's daughter [name] calls with questions regarding why he did not get treatment last week.  I explained to her that per Dr. Ernestina Penna note that he has had a 25 pound weight loss in 3 weeks. He has been having diarrhea and she has prescribed Immodium and Lomotil for him.  We did give him IV fluids and potassium in leu of his treatment on 6/10.  I explained to her how he needs to use the anti-diarrhea meds.  Increase his fluid intake and eat small meals/snacks every 3 hours in leu of large meals.  He is scheduled to come in on 6/17 and we will evaluate whether we can proceed with treatment at that time.  She verbalized an understanding and all her questions were answered.
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
## 2019-07-20 ENCOUNTER — Inpatient Hospital Stay (HOSPITAL_BASED_OUTPATIENT_CLINIC_OR_DEPARTMENT_OTHER): Payer: No Typology Code available for payment source | Admitting: Nurse Practitioner

## 2019-07-20 ENCOUNTER — Inpatient Hospital Stay: Payer: No Typology Code available for payment source

## 2019-07-20 ENCOUNTER — Encounter: Payer: Self-pay | Admitting: Nurse Practitioner

## 2019-07-20 ENCOUNTER — Other Ambulatory Visit: Payer: Self-pay

## 2019-07-20 VITALS — BP 143/75 | HR 61 | Temp 97.7°F | Resp 18 | Ht 71.0 in | Wt 174.9 lb

## 2019-07-20 DIAGNOSIS — C250 Malignant neoplasm of head of pancreas: Secondary | ICD-10-CM

## 2019-07-20 DIAGNOSIS — Z95828 Presence of other vascular implants and grafts: Secondary | ICD-10-CM

## 2019-07-20 DIAGNOSIS — Z5111 Encounter for antineoplastic chemotherapy: Secondary | ICD-10-CM | POA: Diagnosis not present

## 2019-07-20 LAB — CMP (CANCER CENTER ONLY)
ALT: 39 U/L (ref 0–44)
AST: 34 U/L (ref 15–41)
Albumin: 3.2 g/dL — ABNORMAL LOW (ref 3.5–5.0)
Alkaline Phosphatase: 108 U/L (ref 38–126)
Anion gap: 10 (ref 5–15)
BUN: 6 mg/dL — ABNORMAL LOW (ref 8–23)
CO2: 24 mmol/L (ref 22–32)
Calcium: 9.4 mg/dL (ref 8.9–10.3)
Chloride: 100 mmol/L (ref 98–111)
Creatinine: 0.86 mg/dL (ref 0.61–1.24)
GFR, Est AFR Am: >60 mL/min (ref >60)
GFR, Estimated: >60 mL/min (ref >60)
Glucose, Bld: 100 mg/dL — ABNORMAL HIGH (ref 70–99)
Potassium: 3.6 mmol/L (ref 3.5–5.1)
Sodium: 134 mmol/L — ABNORMAL LOW (ref 135–145)
Total Bilirubin: 1.8 mg/dL — ABNORMAL HIGH (ref 0.3–1.2)
Total Protein: 6.5 g/dL (ref 6.5–8.1)

## 2019-07-20 LAB — CBC WITH DIFFERENTIAL (CANCER CENTER ONLY)
Abs Immature Granulocytes: 0.02 10*3/uL (ref 0.00–0.07)
Basophils Absolute: 0 10*3/uL (ref 0.0–0.1)
Basophils Relative: 1 %
Eosinophils Absolute: 0.1 10*3/uL (ref 0.0–0.5)
Eosinophils Relative: 1 %
HCT: 29.7 % — ABNORMAL LOW (ref 39.0–52.0)
Hemoglobin: 10.2 g/dL — ABNORMAL LOW (ref 13.0–17.0)
Immature Granulocytes: 0 %
Lymphocytes Relative: 23 %
Lymphs Abs: 1.9 10*3/uL (ref 0.7–4.0)
MCH: 30.4 pg (ref 26.0–34.0)
MCHC: 34.3 g/dL (ref 30.0–36.0)
MCV: 88.4 fL (ref 80.0–100.0)
Monocytes Absolute: 0.6 10*3/uL (ref 0.1–1.0)
Monocytes Relative: 7 %
Neutro Abs: 5.5 10*3/uL (ref 1.7–7.7)
Neutrophils Relative %: 68 %
Platelet Count: 179 10*3/uL (ref 150–400)
RBC: 3.36 MIL/uL — ABNORMAL LOW (ref 4.22–5.81)
RDW: 14.2 % (ref 11.5–15.5)
WBC Count: 8.1 10*3/uL (ref 4.0–10.5)
nRBC: 0 % (ref 0.0–0.2)

## 2019-07-20 MED ORDER — SODIUM CHLORIDE 0.9 % IV SOLN
10.0000 mg | Freq: Once | INTRAVENOUS | Status: AC
  Administered 2019-07-20: 10 mg via INTRAVENOUS
  Filled 2019-07-20: qty 10

## 2019-07-20 MED ORDER — OXALIPLATIN CHEMO INJECTION 100 MG/20ML
72.0000 mg/m2 | Freq: Once | INTRAVENOUS | Status: AC
  Administered 2019-07-20: 150 mg via INTRAVENOUS
  Filled 2019-07-20: qty 20

## 2019-07-20 MED ORDER — ATROPINE SULFATE 1 MG/ML IJ SOLN
0.5000 mg | Freq: Once | INTRAMUSCULAR | Status: AC | PRN
  Administered 2019-07-20: 0.5 mg via INTRAVENOUS

## 2019-07-20 MED ORDER — ATROPINE SULFATE 1 MG/ML IJ SOLN
INTRAMUSCULAR | Status: AC
  Filled 2019-07-20: qty 1

## 2019-07-20 MED ORDER — PALONOSETRON HCL INJECTION 0.25 MG/5ML
0.2500 mg | Freq: Once | INTRAVENOUS | Status: AC
  Administered 2019-07-20: 0.25 mg via INTRAVENOUS

## 2019-07-20 MED ORDER — SODIUM CHLORIDE 0.9 % IV SOLN
110.0000 mg/m2 | Freq: Once | INTRAVENOUS | Status: AC
  Administered 2019-07-20: 220 mg via INTRAVENOUS
  Filled 2019-07-20: qty 11

## 2019-07-20 MED ORDER — DEXTROSE 5 % IV SOLN
Freq: Once | INTRAVENOUS | Status: AC
  Filled 2019-07-20: qty 250

## 2019-07-20 MED ORDER — SODIUM CHLORIDE 0.9 % IV SOLN
1680.0000 mg/m2 | INTRAVENOUS | Status: DC
  Administered 2019-07-20: 3500 mg via INTRAVENOUS
  Filled 2019-07-20: qty 70

## 2019-07-20 MED ORDER — SODIUM CHLORIDE 0.9 % IV SOLN
150.0000 mg | Freq: Once | INTRAVENOUS | Status: AC
  Administered 2019-07-20: 150 mg via INTRAVENOUS
  Filled 2019-07-20: qty 150

## 2019-07-20 MED ORDER — SODIUM CHLORIDE 0.9% FLUSH
10.0000 mL | INTRAVENOUS | Status: DC | PRN
  Administered 2019-07-20: 10 mL via INTRAVENOUS
  Filled 2019-07-20: qty 10

## 2019-07-20 MED ORDER — SODIUM CHLORIDE 0.9 % IV SOLN
400.0000 mg/m2 | Freq: Once | INTRAVENOUS | Status: AC
  Administered 2019-07-20: 828 mg via INTRAVENOUS
  Filled 2019-07-20: qty 41.4

## 2019-07-20 MED ORDER — PALONOSETRON HCL INJECTION 0.25 MG/5ML
INTRAVENOUS | Status: AC
  Filled 2019-07-20: qty 5

## 2019-07-20 NOTE — Patient Instructions (Signed)
[place] Discharge Instructions for Patients Receiving Chemotherapy  Today you received the following chemotherapy agents Oxaliplatin (ELOXATIN), Leucovorin, Irinotecan (CAMPTOSAR) & Fluorouracil (ADRUCIL).  To help prevent nausea and vomiting after your treatment, we encourage you to take your nausea medication as prescribed.   If you develop nausea and vomiting that is not controlled by your nausea medication, call the clinic.   BELOW ARE SYMPTOMS THAT SHOULD BE REPORTED IMMEDIATELY:  *FEVER GREATER THAN 100.5 F  *CHILLS WITH OR WITHOUT FEVER  NAUSEA AND VOMITING THAT IS NOT CONTROLLED WITH YOUR NAUSEA MEDICATION  *UNUSUAL SHORTNESS OF BREATH  *UNUSUAL BRUISING OR BLEEDING  TENDERNESS IN MOUTH AND THROAT WITH OR WITHOUT PRESENCE OF ULCERS  *URINARY PROBLEMS  *BOWEL PROBLEMS  UNUSUAL RASH Items with * indicate a potential emergency and should be followed up as soon as possible.  Feel free to call the clinic should you have any questions or concerns. The clinic phone number is [phone].  Please show the [place] at check-in to the Emergency Department and triage nurse.

## 2019-07-21 ENCOUNTER — Telehealth: Payer: Self-pay | Admitting: Nurse Practitioner

## 2019-07-21 NOTE — Telephone Encounter (Signed)
Scheduled appt per 6/16 los.  Pt will get an updated appt calendar at his next scheduled appt.

## 2019-07-22 ENCOUNTER — Other Ambulatory Visit: Payer: Self-pay

## 2019-07-22 ENCOUNTER — Inpatient Hospital Stay: Payer: No Typology Code available for payment source

## 2019-07-22 VITALS — BP 125/67 | HR 60 | Temp 98.0°F | Resp 18

## 2019-07-22 DIAGNOSIS — Z5111 Encounter for antineoplastic chemotherapy: Secondary | ICD-10-CM | POA: Diagnosis not present

## 2019-07-22 DIAGNOSIS — C250 Malignant neoplasm of head of pancreas: Secondary | ICD-10-CM

## 2019-07-22 MED ORDER — PEGFILGRASTIM-CBQV 6 MG/0.6ML ~~LOC~~ SOSY
6.0000 mg | PREFILLED_SYRINGE | Freq: Once | SUBCUTANEOUS | Status: AC
  Administered 2019-07-22: 6 mg via SUBCUTANEOUS

## 2019-07-22 MED ORDER — PEGFILGRASTIM-CBQV 6 MG/0.6ML ~~LOC~~ SOSY
PREFILLED_SYRINGE | SUBCUTANEOUS | Status: AC
  Filled 2019-07-22: qty 0.6

## 2019-07-22 MED ORDER — HEPARIN SOD (PORK) LOCK FLUSH 100 UNIT/ML IV SOLN
500.0000 [IU] | Freq: Once | INTRAVENOUS | Status: AC | PRN
  Administered 2019-07-22: 500 [IU]
  Filled 2019-07-22: qty 5

## 2019-07-22 MED ORDER — SODIUM CHLORIDE 0.9% FLUSH
10.0000 mL | INTRAVENOUS | Status: DC | PRN
  Administered 2019-07-22: 10 mL
  Filled 2019-07-22: qty 10
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
## 2019-08-04 ENCOUNTER — Other Ambulatory Visit: Payer: Self-pay

## 2019-08-04 ENCOUNTER — Inpatient Hospital Stay: Payer: No Typology Code available for payment source | Attending: Hematology

## 2019-08-04 ENCOUNTER — Inpatient Hospital Stay: Payer: No Typology Code available for payment source

## 2019-08-04 ENCOUNTER — Inpatient Hospital Stay: Payer: No Typology Code available for payment source | Admitting: Nutrition

## 2019-08-04 ENCOUNTER — Telehealth: Payer: Self-pay | Admitting: Hematology

## 2019-08-04 ENCOUNTER — Encounter: Payer: Self-pay | Admitting: Hematology

## 2019-08-04 ENCOUNTER — Ambulatory Visit (HOSPITAL_COMMUNITY)
Admission: RE | Admit: 2019-08-04 | Discharge: 2019-08-04 | Disposition: A | Discharge status: Home / Self Care | Payer: No Typology Code available for payment source | Source: Ambulatory Visit | Attending: Hematology | Admitting: Hematology

## 2019-08-04 ENCOUNTER — Inpatient Hospital Stay (HOSPITAL_BASED_OUTPATIENT_CLINIC_OR_DEPARTMENT_OTHER): Payer: No Typology Code available for payment source | Admitting: Hematology

## 2019-08-04 VITALS — BP 116/69 | HR 65

## 2019-08-04 VITALS — BP 103/63 | HR 76 | Temp 97.9°F | Resp 18 | Ht 71.0 in | Wt 163.6 lb

## 2019-08-04 DIAGNOSIS — Z5111 Encounter for antineoplastic chemotherapy: Secondary | ICD-10-CM | POA: Diagnosis not present

## 2019-08-04 DIAGNOSIS — E860 Dehydration: Secondary | ICD-10-CM

## 2019-08-04 DIAGNOSIS — C258 Malignant neoplasm of overlapping sites of pancreas: Secondary | ICD-10-CM | POA: Insufficient documentation

## 2019-08-04 DIAGNOSIS — I10 Essential (primary) hypertension: Secondary | ICD-10-CM | POA: Diagnosis not present

## 2019-08-04 DIAGNOSIS — C250 Malignant neoplasm of head of pancreas: Secondary | ICD-10-CM

## 2019-08-04 DIAGNOSIS — Z5189 Encounter for other specified aftercare: Secondary | ICD-10-CM | POA: Insufficient documentation

## 2019-08-04 DIAGNOSIS — D630 Anemia in neoplastic disease: Secondary | ICD-10-CM | POA: Insufficient documentation

## 2019-08-04 DIAGNOSIS — R5383 Other fatigue: Secondary | ICD-10-CM | POA: Diagnosis not present

## 2019-08-04 DIAGNOSIS — E876 Hypokalemia: Secondary | ICD-10-CM | POA: Diagnosis not present

## 2019-08-04 DIAGNOSIS — Z452 Encounter for adjustment and management of vascular access device: Secondary | ICD-10-CM | POA: Diagnosis not present

## 2019-08-04 DIAGNOSIS — Z95828 Presence of other vascular implants and grafts: Secondary | ICD-10-CM

## 2019-08-04 DIAGNOSIS — R197 Diarrhea, unspecified: Secondary | ICD-10-CM | POA: Diagnosis not present

## 2019-08-04 LAB — CBC WITH DIFFERENTIAL (CANCER CENTER ONLY)
Abs Immature Granulocytes: 0.15 10*3/uL — ABNORMAL HIGH (ref 0.00–0.07)
Basophils Absolute: 0 10*3/uL (ref 0.0–0.1)
Basophils Relative: 0 %
Eosinophils Absolute: 0 10*3/uL (ref 0.0–0.5)
Eosinophils Relative: 0 %
HCT: 25 % — ABNORMAL LOW (ref 39.0–52.0)
Hemoglobin: 8.2 g/dL — ABNORMAL LOW (ref 13.0–17.0)
Immature Granulocytes: 1 %
Lymphocytes Relative: 11 %
Lymphs Abs: 1.8 10*3/uL (ref 0.7–4.0)
MCH: 29.1 pg (ref 26.0–34.0)
MCHC: 32.8 g/dL (ref 30.0–36.0)
MCV: 88.7 fL (ref 80.0–100.0)
Monocytes Absolute: 1.6 10*3/uL — ABNORMAL HIGH (ref 0.1–1.0)
Monocytes Relative: 10 %
Neutro Abs: 12.9 10*3/uL — ABNORMAL HIGH (ref 1.7–7.7)
Neutrophils Relative %: 78 %
Platelet Count: 311 10*3/uL (ref 150–400)
RBC: 2.82 MIL/uL — ABNORMAL LOW (ref 4.22–5.81)
RDW: 14.1 % (ref 11.5–15.5)
WBC Count: 16.5 10*3/uL — ABNORMAL HIGH (ref 4.0–10.5)
nRBC: 0 % (ref 0.0–0.2)

## 2019-08-04 LAB — CMP (CANCER CENTER ONLY)
ALT: 43 U/L (ref 0–44)
AST: 44 U/L — ABNORMAL HIGH (ref 15–41)
Albumin: 2.7 g/dL — ABNORMAL LOW (ref 3.5–5.0)
Alkaline Phosphatase: 210 U/L — ABNORMAL HIGH (ref 38–126)
Anion gap: 15 (ref 5–15)
BUN: 13 mg/dL (ref 8–23)
CO2: 22 mmol/L (ref 22–32)
Calcium: 9.4 mg/dL (ref 8.9–10.3)
Chloride: 97 mmol/L — ABNORMAL LOW (ref 98–111)
Creatinine: 1.29 mg/dL — ABNORMAL HIGH (ref 0.61–1.24)
GFR, Est AFR Am: >60 mL/min (ref >60)
GFR, Estimated: 58 mL/min — ABNORMAL LOW (ref >60)
Glucose, Bld: 119 mg/dL — ABNORMAL HIGH (ref 70–99)
Potassium: 3.6 mmol/L (ref 3.5–5.1)
Sodium: 134 mmol/L — ABNORMAL LOW (ref 135–145)
Total Bilirubin: 5.1 mg/dL (ref 0.3–1.2)
Total Protein: 7.2 g/dL (ref 6.5–8.1)

## 2019-08-04 MED ORDER — ATROPINE SULFATE 0.4 MG/ML IJ SOLN
INTRAMUSCULAR | Status: AC
  Filled 2019-08-04: qty 1

## 2019-08-04 MED ORDER — SODIUM CHLORIDE 0.9% FLUSH
10.0000 mL | Freq: Once | INTRAVENOUS | Status: AC
  Administered 2019-08-04: 10 mL via INTRAVENOUS
  Filled 2019-08-04: qty 10

## 2019-08-04 MED ORDER — PALONOSETRON HCL INJECTION 0.25 MG/5ML
INTRAVENOUS | Status: AC
  Filled 2019-08-04: qty 5

## 2019-08-04 MED ORDER — SODIUM CHLORIDE 0.9 % IV SOLN
Freq: Once | INTRAVENOUS | Status: AC
  Filled 2019-08-04: qty 250

## 2019-08-04 MED ORDER — SODIUM CHLORIDE 0.9% FLUSH
10.0000 mL | INTRAVENOUS | Status: DC | PRN
  Administered 2019-08-04: 10 mL via INTRAVENOUS
  Filled 2019-08-04: qty 10

## 2019-08-04 MED ORDER — HEPARIN SOD (PORK) LOCK FLUSH 100 UNIT/ML IV SOLN
500.0000 [IU] | Freq: Once | INTRAVENOUS | Status: AC
  Administered 2019-08-04: 500 [IU] via INTRAVENOUS
  Filled 2019-08-04: qty 5

## 2019-08-04 NOTE — Progress Notes (Signed)
Nutrition follow-up completed with patient during infusion for pancreas cancer.  Patient is receiving fluids today.  He continues to complain of ongoing diarrhea. Weight was documented as 163.6 pounds July 1 down from 168.2 pounds on June 10 and 180 pounds May 27. Noted labs: Sodium 134, glucose 119, creatinine 1.29. Patient reports he cannot eat very much.  He tried oral nutrition supplements as previously educated.  He tried drinking small amounts slowly but complains of continuing diarrhea.  He is eating saltine crackers and applesauce.  He appears to be avoiding high-fiber foods. He declines clear liquid nutrition supplements.  Nutrition diagnosis: Inadequate oral intake continues.  Intervention: Educated patient on low fiber diet strategies again to help with diarrhea. Encouraged increased fluids.  Monitoring, evaluation, goals: Patient will tolerate adequate calories and protein to minimize further weight loss while improving diarrhea.  Next visit: Thursday, July 29 during infusion.  **Disclaimer: This note was dictated with voice recognition software. Similar sounding words can inadvertently be transcribed and this note may contain transcription errors which may not have been corrected upon publication of note.**

## 2019-08-04 NOTE — Progress Notes (Signed)
Critical Value: Bili 5.1 Dr. Burr Medico notified.

## 2019-08-04 NOTE — Telephone Encounter (Signed)
Scheduled per 7/1 los. Messaged secretary rachael to give pt calendar.

## 2019-08-04 NOTE — Progress Notes (Signed)
Per Dr. Burr Medico, no treatment today. Patient to receive IV hydration. Verbal order received and entered.

## 2019-08-04 NOTE — Patient Instructions (Addendum)

## 2019-08-04 NOTE — Progress Notes (Signed)
Critical value received at 0843, Bili 5.1.  Ihor Gully, RN with Dr. Burr Medico notified at [phone].

## 2019-08-05 ENCOUNTER — Telehealth: Payer: Self-pay | Admitting: Hematology

## 2019-08-05 ENCOUNTER — Telehealth: Payer: Self-pay | Admitting: Nurse Practitioner

## 2019-08-05 ENCOUNTER — Other Ambulatory Visit: Payer: Self-pay

## 2019-08-05 DIAGNOSIS — D72828 Other elevated white blood cell count: Secondary | ICD-10-CM

## 2019-08-05 LAB — CANCER ANTIGEN 19-9: CA 19-9: 1147 U/mL — ABNORMAL HIGH (ref 0–35)

## 2019-08-05 MED ORDER — CIPROFLOXACIN HCL 500 MG PO TABS: 500.0000 mg | ORAL_TABLET | Freq: Two times a day (BID) | ORAL | 0 refills | Status: DC

## 2019-08-05 NOTE — Telephone Encounter (Signed)
I called pt's daughter today to review his abdominal ultrasound from yesterday, which showed no evidence of biliary dilatation, stent is patent.  I have discussed the case with his GI team (Dr. Lawernce Ion, Lyndel Safe, and [place]) and we decided to give him a course of antibiotics, Cipro 500 mg twice daily for 7 days, given his elevated white count yesterday, possibility of cholangitis.  If bilirubin does not come down early next week, will obtain a abdominal MRI/MRCP for further evaluation, and a possible empiric stent exchange.  I relayed the above information to his daughter, and also discussed the possibility of chemo related liver dysfunction, or cancer progression, she voiced good understanding, and agrees with the plan.  Cipro was called in today.  Patient was informed.  We will schedule patient lab and f/u next Wednesday.  Truitt Merle  08/05/2019

## 2019-08-05 NOTE — Telephone Encounter (Signed)
Scheduled appt per 7/2 sch msg - pt aware of appts added.

## 2019-08-06 ENCOUNTER — Other Ambulatory Visit: Payer: Self-pay | Admitting: Hematology & Oncology

## 2019-08-06 DIAGNOSIS — D72828 Other elevated white blood cell count: Secondary | ICD-10-CM

## 2019-08-06 MED ORDER — CIPROFLOXACIN HCL 500 MG PO TABS: 500.0000 mg | ORAL_TABLET | Freq: Two times a day (BID) | ORAL | 0 refills | Status: DC

## 2019-08-08 ENCOUNTER — Emergency Department (HOSPITAL_COMMUNITY): Payer: No Typology Code available for payment source

## 2019-08-08 ENCOUNTER — Other Ambulatory Visit: Payer: Self-pay

## 2019-08-08 ENCOUNTER — Observation Stay (HOSPITAL_COMMUNITY)
Admission: EM | Admit: 2019-08-08 | Discharge: 2019-08-10 | Disposition: A | Discharge status: Still In Hospital | Payer: No Typology Code available for payment source | Attending: Internal Medicine | Admitting: Internal Medicine

## 2019-08-08 ENCOUNTER — Encounter (HOSPITAL_COMMUNITY): Payer: Self-pay

## 2019-08-08 DIAGNOSIS — C259 Malignant neoplasm of pancreas, unspecified: Secondary | ICD-10-CM | POA: Diagnosis not present

## 2019-08-08 DIAGNOSIS — K8020 Calculus of gallbladder without cholecystitis without obstruction: Secondary | ICD-10-CM | POA: Insufficient documentation

## 2019-08-08 DIAGNOSIS — Z20822 Contact with and (suspected) exposure to covid-19: Secondary | ICD-10-CM | POA: Diagnosis not present

## 2019-08-08 DIAGNOSIS — I10 Essential (primary) hypertension: Secondary | ICD-10-CM | POA: Diagnosis present

## 2019-08-08 DIAGNOSIS — K831 Obstruction of bile duct: Secondary | ICD-10-CM | POA: Diagnosis not present

## 2019-08-08 DIAGNOSIS — D649 Anemia, unspecified: Secondary | ICD-10-CM | POA: Diagnosis present

## 2019-08-08 DIAGNOSIS — E876 Hypokalemia: Secondary | ICD-10-CM | POA: Insufficient documentation

## 2019-08-08 DIAGNOSIS — F1010 Alcohol abuse, uncomplicated: Secondary | ICD-10-CM | POA: Diagnosis not present

## 2019-08-08 DIAGNOSIS — Z79899 Other long term (current) drug therapy: Secondary | ICD-10-CM | POA: Insufficient documentation

## 2019-08-08 DIAGNOSIS — Z8249 Family history of ischemic heart disease and other diseases of the circulatory system: Secondary | ICD-10-CM | POA: Diagnosis not present

## 2019-08-08 DIAGNOSIS — D6481 Anemia due to antineoplastic chemotherapy: Secondary | ICD-10-CM | POA: Diagnosis not present

## 2019-08-08 DIAGNOSIS — D72828 Other elevated white blood cell count: Secondary | ICD-10-CM

## 2019-08-08 DIAGNOSIS — C801 Malignant (primary) neoplasm, unspecified: Secondary | ICD-10-CM | POA: Diagnosis present

## 2019-08-08 DIAGNOSIS — C250 Malignant neoplasm of head of pancreas: Secondary | ICD-10-CM | POA: Diagnosis not present

## 2019-08-08 DIAGNOSIS — C787 Secondary malignant neoplasm of liver and intrahepatic bile duct: Secondary | ICD-10-CM | POA: Diagnosis present

## 2019-08-08 DIAGNOSIS — D630 Anemia in neoplastic disease: Secondary | ICD-10-CM | POA: Insufficient documentation

## 2019-08-08 LAB — CBC WITH DIFFERENTIAL/PLATELET
Abs Immature Granulocytes: 0.07 10*3/uL (ref 0.00–0.07)
Basophils Absolute: 0 10*3/uL (ref 0.0–0.1)
Basophils Relative: 0 %
Eosinophils Absolute: 0.1 10*3/uL (ref 0.0–0.5)
Eosinophils Relative: 1 %
HCT: 21.6 % — ABNORMAL LOW (ref 39.0–52.0)
Hemoglobin: 7 g/dL — ABNORMAL LOW (ref 13.0–17.0)
Immature Granulocytes: 1 %
Lymphocytes Relative: 20 %
Lymphs Abs: 1.9 10*3/uL (ref 0.7–4.0)
MCH: 29.3 pg (ref 26.0–34.0)
MCHC: 32.4 g/dL (ref 30.0–36.0)
MCV: 90.4 fL (ref 80.0–100.0)
Monocytes Absolute: 1.1 10*3/uL — ABNORMAL HIGH (ref 0.1–1.0)
Monocytes Relative: 11 %
Neutro Abs: 6.5 10*3/uL (ref 1.7–7.7)
Neutrophils Relative %: 67 %
Platelets: 321 10*3/uL (ref 150–400)
RBC: 2.39 MIL/uL — ABNORMAL LOW (ref 4.22–5.81)
RDW: 14.4 % (ref 11.5–15.5)
WBC: 9.7 10*3/uL (ref 4.0–10.5)
nRBC: 0 % (ref 0.0–0.2)

## 2019-08-08 LAB — SARS CORONAVIRUS 2 BY RT PCR (HOSPITAL ORDER, PERFORMED IN ~~LOC~~ HOSPITAL LAB): SARS Coronavirus 2: NEGATIVE

## 2019-08-08 LAB — BASIC METABOLIC PANEL
Anion gap: 10 (ref 5–15)
BUN: 13 mg/dL (ref 8–23)
CO2: 25 mmol/L (ref 22–32)
Calcium: 8.9 mg/dL (ref 8.9–10.3)
Chloride: 100 mmol/L (ref 98–111)
Creatinine, Ser: 1.05 mg/dL (ref 0.61–1.24)
GFR calc Af Amer: >60 mL/min (ref >60)
GFR calc non Af Amer: >60 mL/min (ref >60)
Glucose, Bld: 111 mg/dL — ABNORMAL HIGH (ref 70–99)
Potassium: 3.3 mmol/L — ABNORMAL LOW (ref 3.5–5.1)
Sodium: 135 mmol/L (ref 135–145)

## 2019-08-08 LAB — TRANSFERRIN: Transferrin: 130 mg/dL — ABNORMAL LOW (ref 180–329)

## 2019-08-08 LAB — IRON AND TIBC
Iron: 22 ug/dL — ABNORMAL LOW (ref 45–182)
Saturation Ratios: 12 % — ABNORMAL LOW (ref 17.9–39.5)
TIBC: 182 ug/dL — ABNORMAL LOW (ref 250–450)
UIBC: 160 ug/dL

## 2019-08-08 LAB — URINALYSIS, ROUTINE W REFLEX MICROSCOPIC
Bilirubin Urine: NEGATIVE
Glucose, UA: NEGATIVE mg/dL
Hgb urine dipstick: NEGATIVE
Ketones, ur: NEGATIVE mg/dL
Leukocytes,Ua: NEGATIVE
Nitrite: NEGATIVE
Protein, ur: NEGATIVE mg/dL
Specific Gravity, Urine: 1.013 (ref 1.005–1.030)
pH: 6 (ref 5.0–8.0)

## 2019-08-08 LAB — POC OCCULT BLOOD, ED: Fecal Occult Bld: NEGATIVE

## 2019-08-08 LAB — HEPATIC FUNCTION PANEL
ALT: 34 U/L (ref 0–44)
AST: 36 U/L (ref 15–41)
Albumin: 2.9 g/dL — ABNORMAL LOW (ref 3.5–5.0)
Alkaline Phosphatase: 161 U/L — ABNORMAL HIGH (ref 38–126)
Bilirubin, Direct: 1.2 mg/dL — ABNORMAL HIGH (ref 0.0–0.2)
Indirect Bilirubin: 1.5 mg/dL — ABNORMAL HIGH (ref 0.3–0.9)
Total Bilirubin: 2.7 mg/dL — ABNORMAL HIGH (ref 0.3–1.2)
Total Protein: 6.8 g/dL (ref 6.5–8.1)

## 2019-08-08 LAB — FERRITIN: Ferritin: 1259 ng/mL — ABNORMAL HIGH (ref 24–336)

## 2019-08-08 LAB — BRAIN NATRIURETIC PEPTIDE: B Natriuretic Peptide: 33.3 pg/mL (ref 0.0–100.0)

## 2019-08-08 LAB — VITAMIN B12: Vitamin B-12: 2134 pg/mL — ABNORMAL HIGH (ref 180–914)

## 2019-08-08 LAB — PREPARE RBC (CROSSMATCH)

## 2019-08-08 MED ORDER — PANTOPRAZOLE SODIUM 40 MG PO TBEC
40.0000 mg | DELAYED_RELEASE_TABLET | Freq: Every day | ORAL | Status: DC
  Administered 2019-08-08 – 2019-08-10 (×3): 40 mg via ORAL
  Filled 2019-08-08 (×2): qty 1

## 2019-08-08 MED ORDER — SODIUM CHLORIDE 0.9% IV SOLUTION: Freq: Once | INTRAVENOUS | Status: AC

## 2019-08-08 MED ORDER — ENSURE ENLIVE PO LIQD
237.0000 mL | Freq: Two times a day (BID) | ORAL | Status: DC
  Administered 2019-08-09: 237 mL via ORAL

## 2019-08-08 MED ORDER — CIPROFLOXACIN HCL 500 MG PO TABS
500.0000 mg | ORAL_TABLET | Freq: Two times a day (BID) | ORAL | Status: DC
  Administered 2019-08-08 – 2019-08-10 (×4): 500 mg via ORAL
  Filled 2019-08-08 (×2): qty 1

## 2019-08-08 MED ORDER — ONDANSETRON HCL 4 MG PO TABS: 4.0000 mg | ORAL_TABLET | Freq: Four times a day (QID) | ORAL | Status: DC | PRN

## 2019-08-08 MED ORDER — ACETAMINOPHEN 325 MG PO TABS: 650.0000 mg | ORAL_TABLET | Freq: Four times a day (QID) | ORAL | Status: DC | PRN

## 2019-08-08 MED ORDER — CHLORHEXIDINE GLUCONATE CLOTH 2 % EX PADS
6.0000 | MEDICATED_PAD | Freq: Every day | CUTANEOUS | Status: DC
  Administered 2019-08-09 – 2019-08-10 (×2): 6 via TOPICAL

## 2019-08-08 MED ORDER — ONDANSETRON HCL 4 MG/2ML IJ SOLN: 4.0000 mg | Freq: Four times a day (QID) | INTRAMUSCULAR | Status: DC | PRN

## 2019-08-08 MED ORDER — SODIUM CHLORIDE 0.9% FLUSH
10.0000 mL | INTRAVENOUS | Status: DC | PRN
  Administered 2019-08-10: 10 mL

## 2019-08-08 MED ORDER — POTASSIUM CHLORIDE CRYS ER 20 MEQ PO TBCR
40.0000 meq | EXTENDED_RELEASE_TABLET | Freq: Once | ORAL | Status: AC
  Administered 2019-08-08: 40 meq via ORAL
  Filled 2019-08-08: qty 2

## 2019-08-08 MED ORDER — ENOXAPARIN SODIUM 40 MG/0.4ML ~~LOC~~ SOLN
40.0000 mg | SUBCUTANEOUS | Status: DC
  Administered 2019-08-08 – 2019-08-09 (×2): 40 mg via SUBCUTANEOUS
  Filled 2019-08-08 (×2): qty 0.4

## 2019-08-08 MED ORDER — ACETAMINOPHEN 650 MG RE SUPP: 650.0000 mg | Freq: Four times a day (QID) | RECTAL | Status: DC | PRN

## 2019-08-08 NOTE — ED Provider Notes (Signed)
Medical screening examination/treatment/procedure(s) were conducted as a shared visit with non-physician practitioner(s) and myself.  I personally evaluated the patient during the encounter.  EKG Interpretation  Date/Time:  Monday August 08 2019 15:43:12 EDT Ventricular Rate:  66 PR Interval:    QRS Duration: 98 QT Interval:  421 QTC Calculation: 442 R Axis:     Text Interpretation: Sinus rhythm Abnormal R-wave progression, early transition normalization of anterior T wave abnormality compared to previous, otherwise unchanged Confirmed by Charlesetta Shanks [phone]) on 08/08/2019 5:25:55 PM  Patient reports he has become so fatigued and short of breath at this morning he had to sit down in a chair to do his usual grooming.  He reports his urine has been very dark.  He has felt more short of breath for the past 2 weeks.  Patient has history of biliary stent placed April 2021 and malignant pancreatic neoplasm.  Patient is alert and appropriate.  Pale in appearance.  No respiratory distress at rest.  Lungs grossly clear.  Heart regular.  Abdomen soft without guarding.  Skin warm and dry.  Agree with plan of management.   Charlesetta Shanks, MD 08/08/19 1755

## 2019-08-08 NOTE — H&P (Signed)
History and Physical    Isaac Doyle QVZ:[id number] DOB: December 14, 1955 DOA: 08/08/2019  PCP: System, Pcp Not In   Patient coming from:  Home   Chief Complaint: dyspnea and easy fatigability.   HPI: Isaac Doyle is a 64 y.o. male with medical history significant of hypertension and pancreatic adenocarcinoma, head/neck/body stage III.  He is status post common bile duct stent May 11, 2019.  07/01 Patient was diagnosed with chemotherapy-induced anemia, his hemoglobin was 8.2 and also had hyperbilirubinemia, he underwent ultrasonographic follow-up and his stent was patent, but considering leukocytosis he was placed on ciprofloxacin 500 twice daily, due to possible cholangitis (07/02).  Plan to get an MRCP if no improvement in bilirubins.  For the last 2 weeks patient complains of progressive, severe dyspnea on exertion and easy fatigability.  It is worse with exertion, no improving factors, no associated syncope episode but positive dizziness.  Denies any melena, hematochezia, hematemesis or hematuria.  Patient has not had blood transfusion in the past.  ED Course: Patient was found to have anemia, hemoglobin 7.0.  He was referred for admission for evaluation.  Review of Systems:  1. General: No fevers, no chills, no weight gain or weight loss 2. ENT: No runny nose or sore throat, no hearing disturbances 3. Pulmonary: positive dyspnea as mentioned in HPI but no cough, wheezing, or hemoptysis 4. Cardiovascular: No angina, claudication, lower extremity edema, pnd or orthopnea 5. Gastrointestinal: No nausea or vomiting, no diarrhea or constipation 6. Hematology: No easy bruisability or frequent infections 7. Urology: No dysuria, hematuria or increased urinary frequency 8. Dermatology: No rashes. 9. Neurology: No seizures or paresthesias 10. Musculoskeletal: No joint pain or deformities  Past Medical History:  Diagnosis Date  . Hypertension     Past Surgical History:  Procedure  Laterality Date  . BILIARY STENT PLACEMENT  05/11/2019   Procedure: BILIARY STENT PLACEMENT;  Surgeon: Irene Shipper, MD;  Location: [facility] ENDOSCOPY;  Service: Endoscopy;;  . BILIARY STENT PLACEMENT  06/03/2019   Procedure: BILIARY STENT PLACEMENT;  Surgeon: Jackquline Denmark, MD;  Location: [facility] ENDOSCOPY;  Service: Endoscopy;;  . ERCP N/A 05/11/2019   Procedure: ENDOSCOPIC RETROGRADE CHOLANGIOPANCREATOGRAPHY (ERCP);  Surgeon: Irene Shipper, MD;  Location: [facility] ENDOSCOPY;  Service: Endoscopy;  Laterality: N/A;  with   . ERCP N/A 06/03/2019   Procedure: ENDOSCOPIC RETROGRADE CHOLANGIOPANCREATOGRAPHY (ERCP);  Surgeon: Jackquline Denmark, MD;  Location: [facility] ENDOSCOPY;  Service: Endoscopy;  Laterality: N/A;  . ESOPHAGOGASTRODUODENOSCOPY (EGD) WITH PROPOFOL N/A 05/19/2019   Procedure: ESOPHAGOGASTRODUODENOSCOPY (EGD) WITH PROPOFOL;  Surgeon: [name], MD;  Location: [facility] ENDOSCOPY;  Service: Endoscopy;  Laterality: N/A;  . EUS N/A 05/19/2019   Procedure: UPPER ENDOSCOPIC ULTRASOUND (EUS) LINEAR;  Surgeon: [name], MD;  Location: [facility] ENDOSCOPY;  Service: Endoscopy;  Laterality: N/A;  . FINE NEEDLE ASPIRATION N/A 05/19/2019   Procedure: FINE NEEDLE ASPIRATION (FNA) LINEAR;  Surgeon: [name], MD;  Location: [facility] ENDOSCOPY;  Service: Endoscopy;  Laterality: N/A;  . IR IMAGING GUIDED PORT INSERTION  06/08/2019  . SPHINCTEROTOMY  05/11/2019   Procedure: SPHINCTEROTOMY;  Surgeon: Irene Shipper, MD;  Location: [facility] ENDOSCOPY;  Service: Endoscopy;;  . [name] REMOVAL  06/03/2019   Procedure: STENT REMOVAL;  Surgeon: Jackquline Denmark, MD;  Location: [facility] ENDOSCOPY;  Service: Endoscopy;;     reports that he has never smoked. He has never used smokeless tobacco. He reports current alcohol use of about 22.0 standard drinks of alcohol per week. He reports previous drug use.  No Known Allergies  Family History  Problem Relation Age of Onset  . Lymphoma Mother 27       cancer in lymph nodes, unsure of primary  . Heart attack  Father   . Lupus Sister      Prior to Admission medications   Medication Sig Start Date End Date Taking? Authorizing Provider  atenolol (TENORMIN) 100 MG tablet Take 1 tablet (100 mg total) by mouth daily. 05/14/19  Yes Vann, Jessica U, DO  cetirizine (ZYRTEC) 10 MG tablet Take 10 mg by mouth daily.   Yes [provider]  ciprofloxacin (CIPRO) 500 MG tablet Take 1 tablet (500 mg total) by mouth 2 (two) times daily. 08/06/19   Volanda Napoleon, MD  diphenoxylate-atropine (LOMOTIL) 2.5-0.025 MG tablet Take 1-2 tablets by mouth 4 (four) times daily as needed for diarrhea or loose stools. 07/14/19   Truitt Merle, MD  guaifenesin (HUMIBID E) 400 MG TABS tablet Take 1 tablet (400 mg total) by mouth every 6 (six) hours as needed. Patient taking differently: Take 400 mg by mouth in the morning and at bedtime.  05/13/19   Geradine Girt, DO  ibuprofen (ADVIL) 200 MG tablet Take 200 mg by mouth every 6 (six) hours as needed for headache.    [provider]  lidocaine-prilocaine (EMLA) cream Apply 1 application topically as needed. 06/23/19   Truitt Merle, MD  loperamide (IMODIUM) 2 MG capsule Take 1-2 capsules (2-4 mg total) by mouth every 6 (six) hours as needed for diarrhea or loose stools. 07/14/19   Truitt Merle, MD  magnesium oxide (MAG-OX) 400 (241.3 Mg) MG tablet Take 1 tablet (400 mg total) by mouth daily. 05/14/19   Geradine Girt, DO  ondansetron (ZOFRAN) 8 MG tablet Take 1 tablet (8 mg total) by mouth 2 (two) times daily as needed. Start on day 3 after chemotherapy. 05/25/19   Truitt Merle, MD  potassium chloride SA (KLOR-CON) 20 MEQ tablet Take 2 tablets (40 mEq total) by mouth daily. 06/08/19   Truitt Merle, MD  potassium chloride SA (KLOR-CON) 20 MEQ tablet Take 1 tablet (20 mEq total) by mouth daily. 07/14/19   Truitt Merle, MD  prochlorperazine (COMPAZINE) 10 MG tablet Take 1 tablet (10 mg total) by mouth every 6 (six) hours as needed (Nausea or vomiting). 05/25/19   Truitt Merle, MD  sulindac (CLINORIL)  200 MG tablet Take 400 mg by mouth 2 (two) times daily.    [provider]  traZODone (DESYREL) 100 MG tablet Take 100 mg by mouth at bedtime.    [provider]    Physical Exam: Vitals:   08/08/19 1523 08/08/19 1525 08/08/19 1623 08/08/19 1630  BP:   (!) 96/57 132/71  Pulse:   68 64  Resp:  16 16 (!) 22  Temp: 98 F (36.7 C)  98 F (36.7 C)   TempSrc: Oral  Oral   SpO2:   100% 100%  Weight:      Height:        Vitals:   08/08/19 1523 08/08/19 1525 08/08/19 1623 08/08/19 1630  BP:   (!) 96/57 132/71  Pulse:   68 64  Resp:  16 16 (!) 22  Temp: 98 F (36.7 C)  98 F (36.7 C)   TempSrc: Oral  Oral   SpO2:   100% 100%  Weight:      Height:       General: deconditioned and ill looking appearing  Neurology: Awake and alert, non focal  Head and Neck. Head normocephalic. Neck supple with no adenopathy or thyromegaly.   E ENT: mild to moderate pallor, no icterus, oral mucosa moist Cardiovascular: No JVD. S1-S2 present, rhythmic, no gallops, rubs, or murmurs. Non significant  lower extremity edema. Pulmonary: positive breath sounds bilaterally, adequate air movement, no wheezing, rhonchi or rales. Gastrointestinal. Abdomen with, no organomegaly, non tender, no rebound or guarding Skin. No rashes Musculoskeletal: no joint deformities/ left ankle fusion.     Labs on Admission: I have personally reviewed following labs and imaging studies  CBC: Recent Labs  Lab 08/04/19 0757 08/08/19 1544  WBC 16.5* 9.7  NEUTROABS 12.9* 6.5  HGB 8.2* 7.0*  HCT 25.0* 21.6*  MCV 88.7 90.4  PLT 311 536   Basic Metabolic Panel: Recent Labs  Lab 08/04/19 0757 08/08/19 1544  NA 134* 135  K 3.6 3.3*  CL 97* 100  CO2 22 25  GLUCOSE 119* 111*  BUN 13 13  CREATININE 1.29* 1.05  CALCIUM 9.4 8.9   GFR: Estimated Creatinine Clearance: 72.9 mL/min (by C-G formula based on SCr of 1.05 mg/dL). Liver Function Tests: Recent Labs  Lab 08/04/19 0757 08/08/19 1544  AST  44* 36  ALT 43 34  ALKPHOS 210* 161*  BILITOT 5.1* 2.7*  PROT 7.2 6.8  ALBUMIN 2.7* 2.9*   No results for input(s): LIPASE, AMYLASE in the last 168 hours. No results for input(s): AMMONIA in the last 168 hours. Coagulation Profile: No results for input(s): INR, PROTIME in the last 168 hours. Cardiac Enzymes: No results for input(s): CKTOTAL, CKMB, CKMBINDEX, TROPONINI in the last 168 hours. BNP (last 3 results) No results for input(s): PROBNP in the last 8760 hours. HbA1C: No results for input(s): HGBA1C in the last 72 hours. CBG: No results for input(s): GLUCAP in the last 168 hours. Lipid Profile: No results for input(s): CHOL, HDL, LDLCALC, TRIG, CHOLHDL, LDLDIRECT in the last 72 hours. Thyroid Function Tests: No results for input(s): TSH, T4TOTAL, FREET4, T3FREE, THYROIDAB in the last 72 hours. Anemia Panel: No results for input(s): VITAMINB12, FOLATE, FERRITIN, TIBC, IRON, RETICCTPCT in the last 72 hours. Urine analysis:    Component Value Date/Time   COLORURINE AMBER (A) 06/02/2019 2017   APPEARANCEUR HAZY (A) 06/02/2019 2017   LABSPEC 1.019 06/02/2019 2017   PHURINE 5.0 06/02/2019 2017   GLUCOSEU NEGATIVE 06/02/2019 2017   HGBUR SMALL (A) 06/02/2019 2017   BILIRUBINUR MODERATE (A) 06/02/2019 2017   KETONESUR NEGATIVE 06/02/2019 2017   PROTEINUR 100 (A) 06/02/2019 2017   NITRITE NEGATIVE 06/02/2019 2017   LEUKOCYTESUR SMALL (A) 06/02/2019 2017    Radiological Exams on Admission: DG Chest 1 View  Result Date: 08/08/2019 CLINICAL DATA:  History of pancreatic cancer presenting with shortness of breath. EXAM: CHEST  1 VIEW COMPARISON:  None. FINDINGS: A right-sided venous Port-A-Cath is seen with its distal tip noted near the junction of the superior vena cava and right atrium. There is no evidence of acute infiltrate, pleural effusion or pneumothorax. The heart size and mediastinal contours are within normal limits. The visualized skeletal structures are unremarkable.  IMPRESSION: No active disease. Electronically Signed   By: Virgina Norfolk M.D.   On: 08/08/2019 16:29    EKG: Independently reviewed.  66 bpm, normal axis, normal intervals, sinus rhythm, no ST segment or T wave changes.  Assessment/Plan Principal Problem:   Symptomatic anemia Active Problems:   Essential hypertension   Normochromic normocytic anemia   Malignant biliary obstruction (HCC)   Biliary stricture   Malignant  neoplasm of pancreas [facility])   64 year old male with history of pancreatic cancer complicated by biliary obstruction status post stent placement.  Has been diagnosed with chemotherapy-induced anemia and he had a course of antibiotics with ciprofloxacin for a possible cholangitis as an outpatient.  He presents with 2 weeks of progressive easy fatigability and dyspnea on exertion.  On his initial physical examination his blood pressure was 96/57, heart rate 68, respiratory rate 16, oxygen saturation 100% on room air, temperature 98.  He has mild to moderate pallor, no icterus, lungs clear to auscultation bilaterally, heart S1-S2, present and rhythmic, his abdomen is soft, he does not have significant lower extremity edema. Sodium 135, potassium 3.3, chloride 100, bicarb 25, glucose 111, BUN 13, creatinine 1.0, alkaline phosphatase 161, AST 36, ALT 34, total bilirubin is 2.7 down from 5.1, white count 9.7, hemoglobin 7.0, hematocrit 21.6, platelets 321.  SARS COVID-19 negative.  Chest radiograph with no infiltrates, positive port right internal jugular vein.  Patient will be admitted to the hospital with working diagnosis of symptomatic anemia, likely malignancy related.  1.  Symptomatic anemia, in the setting of pancreatic cancer, under chemotherapy.  Patient will be admitted to the medical ward, will transfuse 2 units packed red blood cells.  Further work-up with iron panel and B12.   2.  Pancreatic adenocarcinoma, head/neck/body status post biliary stent placement.  His liver  profile including bilirubins are improving.  His white cell count is 9.7.  Continue ciprofloxacin as prescribed as an outpatient.  Follow-up with gastroenterology and oncology as an outpatient.  3.  Hypertension.  Currently we will continue holding antihypertensives to prevent hypotension.  4.  Hypokalemia.  Continue potassium correction with 40 mEq potassium chloride.  Follow-up kidney function in a.m.  5.  History of alcohol abuse.  No signs of acute withdrawal, continue neurochecks per unit protocol.    Status is: Observation  The patient remains OBS appropriate and will d/c before 2 midnights.  Dispo: The patient is from: Home              Anticipated d/c is to: Home              Anticipated d/c date is: 1 day              Patient currently is not medically stable to d/c.   DVT prophylaxis: Enoxaparin   Code Status:   full  Family Communication:  No family at the bedside     Consults called:  None   Admission status:  Inpatient    Railey Glad Gerome Apley MD [facility]   08/08/2019, 5:30 PM

## 2019-08-08 NOTE — ED Notes (Signed)
ED TO INPATIENT HANDOFF REPORT  ED Nurse Name and Phone #: jon wled   S Name/Age/Gender Isaac Doyle 64 y.o. male Room/Bed: WA10/WA10  Code Status   Code Status: Prior  Home/SNF/Other Home Patient oriented to: self, place, time and situation Is this baseline? Yes   Triage Complete: Triage complete  Chief Complaint Symptomatic anemia [D64.9]  Triage Note Pt arrived via EMS, per EMS pt with generalized wkn since sat. Hx of pancreatic cancer, treated currently with chemo, per pt he was supposed to receive chemo sat but due to urinalysis results sat was not given chemo, and started on abx for urine. Denies any n/v. Endorses some SOB, x2 wks    Allergies No Known Allergies  Level of Care/Admitting Diagnosis ED Disposition    ED Disposition Condition Comment   Admit  Hospital Area: [place] [[id number]]  Level of Care: Med-Surg [16]  Covid Evaluation: Asymptomatic Screening Protocol (No Symptoms)  Diagnosis: Symptomatic anemia [1751025]  Admitting Physician: Tawni Millers [[id number]]  Attending Physician: Tawni Millers [[id number]]       B Medical/Surgery History Past Medical History:  Diagnosis Date  . Hypertension    Past Surgical History:  Procedure Laterality Date  . BILIARY STENT PLACEMENT  05/11/2019   Procedure: BILIARY STENT PLACEMENT;  Surgeon: Irene Shipper, MD;  Location: [facility] ENDOSCOPY;  Service: Endoscopy;;  . BILIARY STENT PLACEMENT  06/03/2019   Procedure: BILIARY STENT PLACEMENT;  Surgeon: Jackquline Denmark, MD;  Location: [facility] ENDOSCOPY;  Service: Endoscopy;;  . ERCP N/A 05/11/2019   Procedure: ENDOSCOPIC RETROGRADE CHOLANGIOPANCREATOGRAPHY (ERCP);  Surgeon: Irene Shipper, MD;  Location: [facility] ENDOSCOPY;  Service: Endoscopy;  Laterality: N/A;  with   . ERCP N/A 06/03/2019   Procedure: ENDOSCOPIC RETROGRADE CHOLANGIOPANCREATOGRAPHY (ERCP);  Surgeon: Jackquline Denmark, MD;  Location: [facility] ENDOSCOPY;  Service: Endoscopy;  Laterality: N/A;   . ESOPHAGOGASTRODUODENOSCOPY (EGD) WITH PROPOFOL N/A 05/19/2019   Procedure: ESOPHAGOGASTRODUODENOSCOPY (EGD) WITH PROPOFOL;  Surgeon: [name], MD;  Location: [facility] ENDOSCOPY;  Service: Endoscopy;  Laterality: N/A;  . EUS N/A 05/19/2019   Procedure: UPPER ENDOSCOPIC ULTRASOUND (EUS) LINEAR;  Surgeon: [name], MD;  Location: [facility] ENDOSCOPY;  Service: Endoscopy;  Laterality: N/A;  . FINE NEEDLE ASPIRATION N/A 05/19/2019   Procedure: FINE NEEDLE ASPIRATION (FNA) LINEAR;  Surgeon: [name], MD;  Location: [facility] ENDOSCOPY;  Service: Endoscopy;  Laterality: N/A;  . IR IMAGING GUIDED PORT INSERTION  06/08/2019  . SPHINCTEROTOMY  05/11/2019   Procedure: SPHINCTEROTOMY;  Surgeon: Irene Shipper, MD;  Location: [facility] ENDOSCOPY;  Service: Endoscopy;;  . [name] REMOVAL  06/03/2019   Procedure: STENT REMOVAL;  Surgeon: Jackquline Denmark, MD;  Location: [facility] ENDOSCOPY;  Service: Endoscopy;;     A IV Location/Drains/Wounds Patient Lines/Drains/Airways Status    Active Line/Drains/Airways    Name Placement date Placement time Site Days   Implanted Port 06/08/19 Right Chest 06/08/19  1144  Chest  61   GI Stent 8.5 Fr. 06/03/19  1155  --  64          Intake/Output Last 24 hours No intake or output data in the 24 hours ending 08/08/19 1956  Labs/Imaging Results for orders placed or performed during the hospital encounter of 08/08/19 (from the past 48 hour(s))  CBC with Differential     Status: Abnormal   Collection Time: 08/08/19  3:44 PM  Result Value Ref Range   WBC 9.7 4.0 - 10.5 K/uL   RBC 2.39 (L) 4.22 - 5.81  MIL/uL   Hemoglobin 7.0 (L) 13.0 - 17.0 g/dL   HCT 21.6 (L) 39 - 52 %   MCV 90.4 80.0 - 100.0 fL   MCH 29.3 26.0 - 34.0 pg   MCHC 32.4 30.0 - 36.0 g/dL   RDW 14.4 11.5 - 15.5 %   Platelets 321 150 - 400 K/uL   nRBC 0.0 0.0 - 0.2 %   Neutrophils Relative % 67 %   Neutro Abs 6.5 1.7 - 7.7 K/uL   Lymphocytes Relative 20 %   Lymphs Abs 1.9 0.7 - 4.0 K/uL   Monocytes Relative 11 %    Monocytes Absolute 1.1 (H) 0 - 1 K/uL   Eosinophils Relative 1 %   Eosinophils Absolute 0.1 0 - 0 K/uL   Basophils Relative 0 %   Basophils Absolute 0.0 0 - 0 K/uL   Immature Granulocytes 1 %   Abs Immature Granulocytes 0.07 0.00 - 0.07 K/uL    Comment: Performed at [facility], [place] [address], [place], [place] [address]  Hepatic function panel     Status: Abnormal   Collection Time: 08/08/19  3:44 PM  Result Value Ref Range   Total Protein 6.8 6.5 - 8.1 g/dL   Albumin 2.9 (L) 3.5 - 5.0 g/dL   AST 36 15 - 41 U/L   ALT 34 0 - 44 U/L   Alkaline Phosphatase 161 (H) 38 - 126 U/L   Total Bilirubin 2.7 (H) 0.3 - 1.2 mg/dL   Bilirubin, Direct 1.2 (H) 0.0 - 0.2 mg/dL   Indirect Bilirubin 1.5 (H) 0.3 - 0.9 mg/dL    Comment: Performed at [facility], [place] [address] [place], [place] [address]  Basic metabolic panel     Status: Abnormal   Collection Time: 08/08/19  3:44 PM  Result Value Ref Range   Sodium 135 135 - 145 mmol/L   Potassium 3.3 (L) 3.5 - 5.1 mmol/L   Chloride 100 98 - 111 mmol/L   CO2 25 22 - 32 mmol/L   Glucose, Bld 111 (H) 70 - 99 mg/dL    Comment: Glucose reference range applies only to samples taken after fasting for at least 8 hours.   BUN 13 8 - 23 mg/dL   Creatinine, Ser 1.05 0.61 - 1.24 mg/dL   Calcium 8.9 8.9 - 10.3 mg/dL   GFR calc non Af Amer >60 >60 mL/min   GFR calc Af Amer >60 >60 mL/min   Anion gap 10 5 - 15    Comment: Performed at [facility], [place] [address], [place], [place] [address]  Brain natriuretic peptide     Status: None   Collection Time: 08/08/19  3:44 PM  Result Value Ref Range   B Natriuretic Peptide 33.3 0.0 - 100.0 pg/mL    Comment: Performed at [facility], [place] [address] [place], [place] [address]  Type and screen     Status: None   Collection Time: 08/08/19  4:26 PM  Result Value Ref Range   ABO/RH(D) A POS    Antibody Screen NEG    Sample Expiration       08/11/2019,2359 Performed at [facility], [place] [address], [place], [place] [address]   SARS Coronavirus 2 by RT PCR (hospital order, performed in [facility] hospital lab) Nasopharyngeal Nasopharyngeal Swab     Status: None   Collection Time: 08/08/19  4:26 PM   Specimen: Nasopharyngeal Swab  Result Value Ref Range  SARS Coronavirus 2 NEGATIVE NEGATIVE    Comment: (NOTE) SARS-CoV-2 target nucleic acids are NOT DETECTED.  The SARS-CoV-2 RNA is generally detectable in upper and lower respiratory specimens during the acute phase of infection. The lowest concentration of SARS-CoV-2 viral copies this assay can detect is 250 copies / mL. A negative result does not preclude SARS-CoV-2 infection and should not be used as the sole basis for treatment or other patient management decisions.  A negative result may occur with improper specimen collection / handling, submission of specimen other than nasopharyngeal swab, presence of viral mutation(s) within the areas targeted by this assay, and inadequate number of viral copies (<250 copies / mL). A negative result must be combined with clinical observations, patient history, and epidemiological information.  Fact Sheet for Patients:   StrictlyIdeas.no  Fact Sheet for Healthcare Providers: BankingDealers.co.za  This test is not yet approved or  cleared by the [place] FDA and has been authorized for detection and/or diagnosis of SARS-CoV-2 by FDA under an Emergency Use Authorization (EUA).  This EUA will remain in effect (meaning this test can be used) for the duration of the COVID-19 declaration under Section 564(b)(1) of the Act, 21 U.S.C. section 360bbb-3(b)(1), unless the authorization is terminated or revoked sooner.  Performed at [facility], [place] [address], [place], [place] [address]   ABO/Rh     Status: None (Preliminary result)    Collection Time: 08/08/19  4:26 PM  Result Value Ref Range   ABO/RH(D)      A POS Performed at [facility], [place] [address] [place], [place] [address]   POC occult blood, ED Provider will collect     Status: None   Collection Time: 08/08/19  4:50 PM  Result Value Ref Range   Fecal Occult Bld NEGATIVE NEGATIVE   DG Chest 1 View  Result Date: 08/08/2019 CLINICAL DATA:  History of pancreatic cancer presenting with shortness of breath. EXAM: CHEST  1 VIEW COMPARISON:  None. FINDINGS: A right-sided venous Port-A-Cath is seen with its distal tip noted near the junction of the superior vena cava and right atrium. There is no evidence of acute infiltrate, pleural effusion or pneumothorax. The heart size and mediastinal contours are within normal limits. The visualized skeletal structures are unremarkable. IMPRESSION: No active disease. Electronically Signed   By: Virgina Norfolk M.D.   On: 08/08/2019 16:29    Pending Labs Unresulted Labs (From admission, onward) Comment          Start     Ordered   08/08/19 1536  Urinalysis, Routine w reflex microscopic  ONCE - STAT,   STAT        08/08/19 1536   Signed and Held  CBC  (enoxaparin (LOVENOX)    CrCl >/= 30 ml/min)  Once,   R       Comments: Baseline for enoxaparin therapy IF NOT ALREADY DRAWN.  Notify MD if PLT < 100 K.    Signed and Held   Signed and Held  Creatinine, serum  (enoxaparin (LOVENOX)    CrCl >/= 30 ml/min)  Once,   R       Comments: Baseline for enoxaparin therapy IF NOT ALREADY DRAWN.    Signed and Held   Signed and Held  Creatinine, serum  (enoxaparin (LOVENOX)    CrCl >/= 30 ml/min)  Weekly,   R     Comments: while on enoxaparin therapy    Signed and Held   Signed and  Held  CBC  Tomorrow morning,   R        Signed and Held   Signed and Held  Type and screen [place]  Once,   R       Comments: [place]    Signed and Held   Signed and Held  Prepare RBC  (crossmatch)  (Adult Blood Administration - Red Blood Cells)  Once,   R       Question Answer Comment  # of Units 2 units   Transfusion Indications Symptomatic Anemia   Number of Units to Keep Ahead NO units ahead   If emergent release call blood bank Not emergent release      Signed and Held   Signed and Held  Iron and TIBC  Once,   R        Signed and Held   Signed and Held  Ferritin  Once,   R        Signed and Held   Signed and Held  Transferrin  Once,   R        Signed and Held   Signed and Held  Vitamin B12  Once,   R        Signed and Held   Signed and Held  Comprehensive metabolic panel  Tomorrow morning,   R        Signed and Held          Vitals/Pain Today's Vitals   08/08/19 1525 08/08/19 1623 08/08/19 1630 08/08/19 1830  BP:  (!) 96/57 132/71 98/63  Pulse:  68 64 63  Resp: 16 16 (!) 22 20  Temp:  98 F (36.7 C)    TempSrc:  Oral    SpO2:  100% 100% 100%  Weight:      Height:      PainSc:        Isolation Precautions No active isolations  Medications Medications - No data to display  Mobility walks with device Moderate fall risk   Focused Assessments Pulmonary Assessment Handoff:  Lung sounds:   O2 Device: Room Air        R Recommendations: See Admitting Provider Note  Report given to:   Additional Notes:

## 2019-08-08 NOTE — ED Triage Notes (Signed)
Pt arrived via EMS, per EMS pt with generalized wkn since sat. Hx of pancreatic cancer, treated currently with chemo, per pt he was supposed to receive chemo sat but due to urinalysis results sat was not given chemo, and started on abx for urine. Denies any n/v. Endorses some SOB, x2 wks

## 2019-08-08 NOTE — ED Provider Notes (Signed)
[facility] DEPT Provider Note   CSN: [id number] Arrival date & time: 08/08/19  1504     History Chief Complaint  Patient presents with  . Weakness    Isaac Doyle is a 64 y.o. male who presents emergency department with a chief complaint of weakness.  He has a past medical history of obstructive, malignant neoplasm of the pancreas, biliary stricture.  He is status post biliary stent placement on 06/03/2019.  The patient is a moderate historian which limits the intake to some degree.  Medical records reviewed for further information.  The patient states that he was supposed to have chemo this past Thursday however when he went in they said his urine was "too dark."  He states that he was put on an antibiotic.  He states that he has done "no good."  Patient states that this happened before his biliary stent was placed.  He was feeling very sick and was jaundiced and had very dark urine at that time.  He states that after the stent was placed everything seemed to improve.  He has noticed increasing jaundice and dark urine over the past week.  He has had malaise, and exertional dyspnea which has been significantly worsening over the past 2 weeks.  Patient states "I cannot get from this bed to the door without having to sit down to catch my breath."  He denies chest pain, orthopnea or PND.  He denies any swelling in his lower extremities.  He does not smoke.  He states that his appetite is very poor and he is only able to take small amounts.  He denies nausea, vomiting.  He denies fever.  He is currently taking his Cipro. Review of EMR, shows that the patient recently had an ultrasound on 7 1 to evaluate the biliary stent which appeared to be functioning normally.  Note by Dr. Burr Medico states that she and the GI team reviewed and will start the patient on Cipro.  They plan to reevaluate with MRI MRCP if his bilirubin level would not come down.   HPI     Past Medical  History:  Diagnosis Date  . Hypertension     Patient Active Problem List   Diagnosis Date Noted  . Symptomatic anemia 08/08/2019  . Biliary obstruction   . Malignant obstructive jaundice ([place]) 06/03/2019  . Malignant neoplasm of pancreas ([place])   . Biliary stricture   . ARF (acute renal failure) ([place]) 05/10/2019  . Essential hypertension 05/10/2019  . Hypokalemia 05/10/2019  . Normochromic normocytic anemia 05/10/2019  . Malignant biliary obstruction ([place])   . Jaundice 05/09/2019    Past Surgical History:  Procedure Laterality Date  . BILIARY STENT PLACEMENT  05/11/2019   Procedure: BILIARY STENT PLACEMENT;  Surgeon: Irene Shipper, MD;  Location: [facility] ENDOSCOPY;  Service: Endoscopy;;  . BILIARY STENT PLACEMENT  06/03/2019   Procedure: BILIARY STENT PLACEMENT;  Surgeon: Jackquline Denmark, MD;  Location: [facility] ENDOSCOPY;  Service: Endoscopy;;  . ERCP N/A 05/11/2019   Procedure: ENDOSCOPIC RETROGRADE CHOLANGIOPANCREATOGRAPHY (ERCP);  Surgeon: Irene Shipper, MD;  Location: [facility] ENDOSCOPY;  Service: Endoscopy;  Laterality: N/A;  with   . ERCP N/A 06/03/2019   Procedure: ENDOSCOPIC RETROGRADE CHOLANGIOPANCREATOGRAPHY (ERCP);  Surgeon: Jackquline Denmark, MD;  Location: [facility] ENDOSCOPY;  Service: Endoscopy;  Laterality: N/A;  . ESOPHAGOGASTRODUODENOSCOPY (EGD) WITH PROPOFOL N/A 05/19/2019   Procedure: ESOPHAGOGASTRODUODENOSCOPY (EGD) WITH PROPOFOL;  Surgeon: [name], MD;  Location: [facility] ENDOSCOPY;  Service: Endoscopy;  Laterality: N/A;  .  EUS N/A 05/19/2019   Procedure: UPPER ENDOSCOPIC ULTRASOUND (EUS) LINEAR;  Surgeon: [name], MD;  Location: [facility] ENDOSCOPY;  Service: Endoscopy;  Laterality: N/A;  . FINE NEEDLE ASPIRATION N/A 05/19/2019   Procedure: FINE NEEDLE ASPIRATION (FNA) LINEAR;  Surgeon: [name], MD;  Location: [facility] ENDOSCOPY;  Service: Endoscopy;  Laterality: N/A;  . IR IMAGING GUIDED PORT INSERTION  06/08/2019  . SPHINCTEROTOMY  05/11/2019   Procedure: SPHINCTEROTOMY;  Surgeon: Irene Shipper, MD;  Location: [facility] ENDOSCOPY;  Service: Endoscopy;;  . [name] REMOVAL  06/03/2019   Procedure: STENT REMOVAL;  Surgeon: Jackquline Denmark, MD;  Location: [facility] ENDOSCOPY;  Service: Endoscopy;;       Family History  Problem Relation Age of Onset  . Lymphoma Mother 5       cancer in lymph nodes, unsure of primary  . Heart attack Father   . Lupus Sister     Social History   Tobacco Use  . Smoking status: Never Smoker  . Smokeless tobacco: Never Used  . Tobacco comment: plan to quit completely   Substance Use Topics  . Alcohol use: Yes    Alcohol/week: 22.0 standard drinks    Types: 12 Cans of beer, 10 Shots of liquor per week  . Drug use: Not Currently    Home Medications Prior to Admission medications   Medication Sig Start Date End Date Taking? Authorizing Provider  atenolol (TENORMIN) 100 MG tablet Take 1 tablet (100 mg total) by mouth daily. 05/14/19  Yes Vann, Jessica U, DO  cetirizine (ZYRTEC) 10 MG tablet Take 10 mg by mouth daily.   Yes [provider]  ciprofloxacin (CIPRO) 500 MG tablet Take 1 tablet (500 mg total) by mouth 2 (two) times daily. Patient taking differently: Take 500 mg by mouth 2 (two) times daily. Start date : 08/06/19 08/06/19  Yes Volanda Napoleon, MD  diphenoxylate-atropine (LOMOTIL) 2.5-0.025 MG tablet Take 1-2 tablets by mouth 4 (four) times daily as needed for diarrhea or loose stools. Patient taking differently: Take 2 tablets by mouth 4 (four) times daily as needed for diarrhea or loose stools.  07/14/19  Yes Truitt Merle, MD  guaifenesin (HUMIBID E) 400 MG TABS tablet Take 1 tablet (400 mg total) by mouth every 6 (six) hours as needed. Patient taking differently: Take 400 mg by mouth in the morning and at bedtime.  05/13/19  Yes Vann, Jessica U, DO  ibuprofen (ADVIL) 200 MG tablet Take 200 mg by mouth every 6 (six) hours as needed for headache.   Yes [provider]  lidocaine-prilocaine (EMLA) cream Apply 1 application topically as  needed. Patient taking differently: Apply 1 application topically as needed (To access the port).  06/23/19  Yes Truitt Merle, MD  loperamide (IMODIUM) 2 MG capsule Take 1-2 capsules (2-4 mg total) by mouth every 6 (six) hours as needed for diarrhea or loose stools. 07/14/19  Yes Truitt Merle, MD  magnesium oxide (MAG-OX) 400 (241.3 Mg) MG tablet Take 1 tablet (400 mg total) by mouth daily. Patient taking differently: Take 400 mg by mouth every other day.  05/14/19  Yes Vann, Jessica U, DO  ondansetron (ZOFRAN) 8 MG tablet Take 1 tablet (8 mg total) by mouth 2 (two) times daily as needed. Start on day 3 after chemotherapy. Patient taking differently: Take 8 mg by mouth 2 (two) times daily as needed for nausea or vomiting. Start on day 3 after chemotherapy. 05/25/19  Yes Truitt Merle, MD  potassium chloride SA (KLOR-CON)  20 MEQ tablet Take 2 tablets (40 mEq total) by mouth daily. 06/08/19  Yes Truitt Merle, MD  sulindac (CLINORIL) 200 MG tablet Take 400 mg by mouth 2 (two) times daily.   Yes [provider]  potassium chloride SA (KLOR-CON) 20 MEQ tablet Take 1 tablet (20 mEq total) by mouth daily. Patient not taking: Reported on 08/08/2019 07/14/19   Truitt Merle, MD  prochlorperazine (COMPAZINE) 10 MG tablet Take 1 tablet (10 mg total) by mouth every 6 (six) hours as needed (Nausea or vomiting). 05/25/19   Truitt Merle, MD    Allergies    Patient has no known allergies.  Review of Systems   Review of Systems Ten systems reviewed and are negative for acute change, except as noted in the HPI.   Physical Exam Updated Vital Signs BP 116/61 (BP Location: Left Arm)   Pulse (!) 59   Temp 98 F (36.7 C)   Resp 18   Ht 5\' 11"  (1.803 m)   Wt 73.5 kg   SpO2 100%   BMI 22.59 kg/m   Physical Exam Vitals and nursing note reviewed.  Constitutional:      General: He is not in acute distress.    Appearance: He is well-developed. He is ill-appearing. He is not diaphoretic.  HENT:     Head: Normocephalic and  atraumatic.  Eyes:     General: Scleral icterus present.     Extraocular Movements: Extraocular movements intact.     Conjunctiva/sclera: Conjunctivae normal.     Pupils: Pupils are equal, round, and reactive to light.  Cardiovascular:     Rate and Rhythm: Normal rate and regular rhythm.     Heart sounds: Normal heart sounds.  Pulmonary:     Effort: Pulmonary effort is normal. No respiratory distress.     Breath sounds: Normal breath sounds.  Abdominal:     Palpations: Abdomen is soft.     Tenderness: There is no abdominal tenderness.  Genitourinary:    Comments: Digital Rectal Exam reveals sphincter with good tone. No external hemorrhoids. No masses or fissures. Stool color is brown with no overt blood.  Musculoskeletal:     Cervical back: Normal range of motion and neck supple.  Skin:    General: Skin is warm and dry.  Neurological:     General: No focal deficit present.     Mental Status: He is alert and oriented to person, place, and time.  Psychiatric:        Behavior: Behavior normal.     ED Results / Procedures / Treatments   Labs (all labs ordered are listed, but only abnormal results are displayed) Labs Reviewed  CBC WITH DIFFERENTIAL/PLATELET - Abnormal; Notable for the following components:      Result Value   RBC 2.39 (*)    Hemoglobin 7.0 (*)    HCT 21.6 (*)    Monocytes Absolute 1.1 (*)    All other components within normal limits  HEPATIC FUNCTION PANEL - Abnormal; Notable for the following components:   Albumin 2.9 (*)    Alkaline Phosphatase 161 (*)    Total Bilirubin 2.7 (*)    Bilirubin, Direct 1.2 (*)    Indirect Bilirubin 1.5 (*)    All other components within normal limits  BASIC METABOLIC PANEL - Abnormal; Notable for the following components:   Potassium 3.3 (*)    Glucose, Bld 111 (*)    All other components within normal limits  URINALYSIS, ROUTINE W REFLEX MICROSCOPIC - Abnormal;  Notable for the following components:   Color, Urine AMBER  (*)    All other components within normal limits  CBC - Abnormal; Notable for the following components:   RBC 3.10 (*)    Hemoglobin 8.9 (*)    HCT 27.1 (*)    RDW 16.0 (*)    All other components within normal limits  IRON AND TIBC - Abnormal; Notable for the following components:   Iron 22 (*)    TIBC 182 (*)    Saturation Ratios 12 (*)    All other components within normal limits  FERRITIN - Abnormal; Notable for the following components:   Ferritin 1,259 (*)    All other components within normal limits  TRANSFERRIN - Abnormal; Notable for the following components:   Transferrin 130 (*)    All other components within normal limits  VITAMIN B12 - Abnormal; Notable for the following components:   Vitamin B-12 2,134 (*)    All other components within normal limits  COMPREHENSIVE METABOLIC PANEL - Abnormal; Notable for the following components:   Calcium 8.8 (*)    Albumin 2.6 (*)    Alkaline Phosphatase 143 (*)    Total Bilirubin 3.7 (*)    All other components within normal limits  SARS CORONAVIRUS 2 BY RT PCR (HOSPITAL ORDER, [facility])  BRAIN NATRIURETIC PEPTIDE  RETICULOCYTES  LACTATE DEHYDROGENASE  HAPTOGLOBIN  POC OCCULT BLOOD, ED  TYPE AND SCREEN  ABO/RH  PREPARE RBC (CROSSMATCH)    EKG EKG Interpretation  Date/Time:  Monday August 08 2019 15:43:12 EDT Ventricular Rate:  66 PR Interval:    QRS Duration: 98 QT Interval:  421 QTC Calculation: 442 R Axis:     Text Interpretation: Sinus rhythm Abnormal R-wave progression, early transition normalization of anterior T wave abnormality compared to previous, otherwise unchanged Confirmed by Charlesetta Shanks [phone]) on 08/08/2019 5:25:55 PM   Radiology DG Chest 1 View  Result Date: 08/08/2019 CLINICAL DATA:  History of pancreatic cancer presenting with shortness of breath. EXAM: CHEST  1 VIEW COMPARISON:  None. FINDINGS: A right-sided venous Port-A-Cath is seen with its distal tip noted near  the junction of the superior vena cava and right atrium. There is no evidence of acute infiltrate, pleural effusion or pneumothorax. The heart size and mediastinal contours are within normal limits. The visualized skeletal structures are unremarkable. IMPRESSION: No active disease. Electronically Signed   By: Virgina Norfolk M.D.   On: 08/08/2019 16:29    Procedures .Critical Care Performed by: Margarita Mail, PA-C Authorized by: Margarita Mail, PA-C   Critical care provider statement:    Critical care time (minutes):  45   Critical care time was exclusive of:  Separately billable procedures and treating other patients   Critical care was necessary to treat or prevent imminent or life-threatening deterioration of the following conditions:  Circulatory failure   Critical care was time spent personally by me on the following activities:  Discussions with consultants, evaluation of patient's response to treatment, examination of patient, ordering and performing treatments and interventions, ordering and review of laboratory studies, ordering and review of radiographic studies, pulse oximetry, re-evaluation of patient's condition, obtaining history from patient or surrogate, review of old charts and development of treatment plan with patient or surrogate   (including critical care time)  Medications Ordered in ED Medications  enoxaparin (LOVENOX) injection 40 mg (40 mg Subcutaneous Given 08/08/19 2249)  acetaminophen (TYLENOL) tablet 650 mg (has no administration in time range)  Or  acetaminophen (TYLENOL) suppository 650 mg (has no administration in time range)  ondansetron (ZOFRAN) tablet 4 mg (has no administration in time range)    Or  ondansetron (ZOFRAN) injection 4 mg (has no administration in time range)  ciprofloxacin (CIPRO) tablet 500 mg (500 mg Oral Given 08/09/19 0808)  pantoprazole (PROTONIX) EC tablet 40 mg (40 mg Oral Given 08/09/19 0808)  sodium chloride flush (NS) 0.9 %  injection 10-40 mL (has no administration in time range)  Chlorhexidine Gluconate Cloth 2 % PADS 6 each (6 each Topical Given 08/09/19 0915)  feeding supplement (ENSURE ENLIVE) (ENSURE ENLIVE) liquid 237 mL (237 mLs Oral Given 08/09/19 0916)  0.9 %  sodium chloride infusion (Manually program via Guardrails IV Fluids) (0 mLs Intravenous Stopped 08/09/19 0700)  potassium chloride SA (KLOR-CON) CR tablet 40 mEq (40 mEq Oral Given 08/08/19 2249)    ED Course  I have reviewed the triage vital signs and the nursing notes.  Pertinent labs & imaging results that were available during my care of the patient were reviewed by me and considered in my medical decision making (see chart for details).  Clinical Course as of Aug 09 942  Mon Aug 08, 2019  1621 HGB down 3 g over 3 weeks  Hemoglobin(!): 7.0 [AH]    Clinical Course User Index [AH] Margarita Mail, PA-C   MDM Rules/Calculators/A&P                          [id number] VS:  Vitals:   08/09/19 0230 08/09/19 0235 08/09/19 0448 08/09/19 0849  BP: 110/68  125/72 116/61  Pulse:   (!) 58 (!) 59  Resp: 20  20 18   Temp: 97.9 F (36.6 C)  97.9 F (36.6 C) 98 F (36.7 C)  TempSrc: Oral  Oral   SpO2: 100% 100% 100% 100%  Weight:      Height:        VF:[id number] is gathered by patient  and nephew at bedside. Previous records obtained and reviewed. DDX:The patient's complaint of weakness involves an extensive number of diagnostic and treatment options, and is a complaint that carries with it a high risk of complications, morbidity, and potential mortality. Given the large differential diagnosis, medical decision making is of high complexity. The differential diagnosis of weakness includes but is not limited to neurologic causes (GBS, myasthenia gravis, CVA, MS, ALS, transverse myelitis, spinal cord injury, CVA, botulism, ) and other causes: ACS, Arrhythmia, syncope, orthostatic hypotension, sepsis, hypoglycemia, electrolyte disturbance,  hypothyroidism, respiratory failure, symptomatic anemia, dehydration, heat injury, polypharmacy, malignancy. Labs: I ordered reviewed and interpreted labs which included BMP which shows mild hyper K anemia, hepatic function panel shows a total bilirubin of 2.7 down from 5.14 days ago.  CBC shows hemoglobin of 7 down from 10.9 over the past 3 weeks.  Occult stool test negative, BNP within normal limits, type and screen and ABO/Rh pending.  Covid test pending.  Imaging: I ordered and reviewed images which included . I independently visualized and interpreted all imaging.. There are no acute, significant findings on today's images. JOA:CZYSA rhythm at a rate of 66  Consults: Dr. Cathlean Sauer of [facility] [id number] here with weakness. Found to have sig drop in hgb. Neg hemoccult- doubt GI bleed. Will need admission and blood transfusion. I have ordered a type and screen. Patient disposition:admit  The patient appears reasonably stabilized for admission considering the current resources, flow, and capabilities available in the ED at this time,  and I doubt any other EMC requiring further screening and/or treatment in the ED prior to admission.        Final Clinical Impression(s) / ED Diagnoses Final diagnoses:  Symptomatic anemia    Rx / DC Orders ED Discharge Orders    None       Margarita Mail, PA-C 08/09/19 8241    Charlesetta Shanks, MD 08/09/19 1525

## 2019-08-09 DIAGNOSIS — D649 Anemia, unspecified: Secondary | ICD-10-CM | POA: Diagnosis not present

## 2019-08-09 DIAGNOSIS — C801 Malignant (primary) neoplasm, unspecified: Secondary | ICD-10-CM | POA: Diagnosis not present

## 2019-08-09 DIAGNOSIS — K831 Obstruction of bile duct: Secondary | ICD-10-CM | POA: Diagnosis not present

## 2019-08-09 DIAGNOSIS — C250 Malignant neoplasm of head of pancreas: Secondary | ICD-10-CM | POA: Diagnosis not present

## 2019-08-09 LAB — CBC
HCT: 27.1 % — ABNORMAL LOW (ref 39.0–52.0)
Hemoglobin: 8.9 g/dL — ABNORMAL LOW (ref 13.0–17.0)
MCH: 28.7 pg (ref 26.0–34.0)
MCHC: 32.8 g/dL (ref 30.0–36.0)
MCV: 87.4 fL (ref 80.0–100.0)
Platelets: 310 10*3/uL (ref 150–400)
RBC: 3.1 MIL/uL — ABNORMAL LOW (ref 4.22–5.81)
RDW: 16 % — ABNORMAL HIGH (ref 11.5–15.5)
WBC: 6.6 10*3/uL (ref 4.0–10.5)
nRBC: 0 % (ref 0.0–0.2)

## 2019-08-09 LAB — COMPREHENSIVE METABOLIC PANEL
ALT: 35 U/L (ref 0–44)
AST: 36 U/L (ref 15–41)
Albumin: 2.6 g/dL — ABNORMAL LOW (ref 3.5–5.0)
Alkaline Phosphatase: 143 U/L — ABNORMAL HIGH (ref 38–126)
Anion gap: 10 (ref 5–15)
BUN: 11 mg/dL (ref 8–23)
CO2: 24 mmol/L (ref 22–32)
Calcium: 8.8 mg/dL — ABNORMAL LOW (ref 8.9–10.3)
Chloride: 101 mmol/L (ref 98–111)
Creatinine, Ser: 0.88 mg/dL (ref 0.61–1.24)
GFR calc Af Amer: >60 mL/min (ref >60)
GFR calc non Af Amer: >60 mL/min (ref >60)
Glucose, Bld: 98 mg/dL (ref 70–99)
Potassium: 3.7 mmol/L (ref 3.5–5.1)
Sodium: 135 mmol/L (ref 135–145)
Total Bilirubin: 3.7 mg/dL — ABNORMAL HIGH (ref 0.3–1.2)
Total Protein: 6.6 g/dL (ref 6.5–8.1)

## 2019-08-09 LAB — RETICULOCYTES
Immature Retic Fract: 18.8 % — ABNORMAL HIGH (ref 2.3–15.9)
RBC.: 3.14 MIL/uL — ABNORMAL LOW (ref 4.22–5.81)
Retic Count, Absolute: 73.8 10*3/uL (ref 19.0–186.0)
Retic Ct Pct: 2.4 % (ref 0.4–3.1)

## 2019-08-09 LAB — ABO/RH: ABO/RH(D): A POS

## 2019-08-09 LAB — LACTATE DEHYDROGENASE: LDH: 99 U/L (ref 98–192)

## 2019-08-09 MED ORDER — PANTOPRAZOLE SODIUM 40 MG PO TBEC: 40.0000 mg | DELAYED_RELEASE_TABLET | Freq: Every day | ORAL | 0 refills | Status: DC

## 2019-08-09 MED ORDER — ENSURE ENLIVE PO LIQD: 237.0000 mL | Freq: Two times a day (BID) | ORAL | 0 refills | Status: AC

## 2019-08-09 MED ORDER — HEPARIN SOD (PORK) LOCK FLUSH 100 UNIT/ML IV SOLN
500.0000 [IU] | INTRAVENOUS | Status: AC | PRN
  Administered 2019-08-10: 500 [IU]

## 2019-08-09 MED ORDER — SODIUM CHLORIDE 0.9 % IV SOLN: 510.0000 mg | Freq: Once | INTRAVENOUS | Status: DC

## 2019-08-09 NOTE — Consult Note (Signed)
Consultation  Referring Provider:  Dr Burr Medico oncology Primary Care Physician:  System, Pcp Not In Primary Gastroenterologist:  Dr.Perry  Reason for Consultation:   Pancreatic cancer, prior stent placement with rising bilirubin  HPI: Isaac Doyle is a 64 y.o. male, who was diagnosed with pancreatic adenocarcinoma, stage III in April 2021.  He had a diffuse infiltrative process involving the head neck body and tail of the pancreas as well as encasement and narrowing of the portal venous confluence and proximal portal vein, no convincing evidence for involvement of the celiac .  He underwent ERCP with stent placement with Dr. Henrene Pastor on 05/11/2019 with placement of a 10 French/7 cm plastic stent for obstructive jaundice.  He required second ERCP on 431 2021 due to progressive jaundice.  The stent had migrated and was pulled and a new 10 French/6 cm covered metal stent was placed per Dr. Lyndel Safe. Reviewing labs, bili was down to 1.8 on 07/20/2019, on 08/04/2018 1T bili 5.1/alk phos 210, and on admit yesterday T bili 2.7 alk phos 161. Today T bili 3.7/alk phos 143/AST of 36. Upper abdominal ultrasound was done on 08/04/2019 showing gallbladder sludge, CBD of 6 mm and stent felt to be in position.  Last CT imaging had been done in April. Patient has been undergoing chemotherapy.  He presented to the emergency room with complaints of fatigue and shortness of breath yesterday.  He has been anemic, felt chemo related, last hemoglobin on 08/04/2019 was 8.2. Yesterday on admit hemoglobin down to 7 and he was transfused 2 units of packed RBCs with good response and hemoglobin of 8.9 today Patient had been placed on Cipro per Dr. Burr Medico when rising bilirubin had been noted to cover for possible early cholangitis.  Patient initially was to be discharged today as he had had some improvement in bilirubin but today with rise to 3.7 decision made to hold his discharge and involve GI for possible repeat ERCP.  Patient says he  is feeling much better today since the blood transfusions, he denies any issues currently with abdominal pain, no nausea or vomiting, no fever or chills.  Last chemo was a little over 2 weeks ago.   Past Medical History:  Diagnosis Date  . Hypertension     Past Surgical History:  Procedure Laterality Date  . BILIARY STENT PLACEMENT  05/11/2019   Procedure: BILIARY STENT PLACEMENT;  Surgeon: Irene Shipper, MD;  Location: [facility] ENDOSCOPY;  Service: Endoscopy;;  . BILIARY STENT PLACEMENT  06/03/2019   Procedure: BILIARY STENT PLACEMENT;  Surgeon: Jackquline Denmark, MD;  Location: [facility] ENDOSCOPY;  Service: Endoscopy;;  . ERCP N/A 05/11/2019   Procedure: ENDOSCOPIC RETROGRADE CHOLANGIOPANCREATOGRAPHY (ERCP);  Surgeon: Irene Shipper, MD;  Location: [facility] ENDOSCOPY;  Service: Endoscopy;  Laterality: N/A;  with   . ERCP N/A 06/03/2019   Procedure: ENDOSCOPIC RETROGRADE CHOLANGIOPANCREATOGRAPHY (ERCP);  Surgeon: Jackquline Denmark, MD;  Location: [facility] ENDOSCOPY;  Service: Endoscopy;  Laterality: N/A;  . ESOPHAGOGASTRODUODENOSCOPY (EGD) WITH PROPOFOL N/A 05/19/2019   Procedure: ESOPHAGOGASTRODUODENOSCOPY (EGD) WITH PROPOFOL;  Surgeon: [name], MD;  Location: [facility] ENDOSCOPY;  Service: Endoscopy;  Laterality: N/A;  . EUS N/A 05/19/2019   Procedure: UPPER ENDOSCOPIC ULTRASOUND (EUS) LINEAR;  Surgeon: [name], MD;  Location: [facility] ENDOSCOPY;  Service: Endoscopy;  Laterality: N/A;  . FINE NEEDLE ASPIRATION N/A 05/19/2019   Procedure: FINE NEEDLE ASPIRATION (FNA) LINEAR;  Surgeon: [name], MD;  Location: [facility] ENDOSCOPY;  Service: Endoscopy;  Laterality: N/A;  . IR  IMAGING GUIDED PORT INSERTION  06/08/2019  . SPHINCTEROTOMY  05/11/2019   Procedure: SPHINCTEROTOMY;  Surgeon: Irene Shipper, MD;  Location: [facility] ENDOSCOPY;  Service: Endoscopy;;  . [name] REMOVAL  06/03/2019   Procedure: STENT REMOVAL;  Surgeon: Jackquline Denmark, MD;  Location: [facility] ENDOSCOPY;  Service: Endoscopy;;    Prior to Admission medications    Medication Sig Start Date End Date Taking? Authorizing Provider  atenolol (TENORMIN) 100 MG tablet Take 1 tablet (100 mg total) by mouth daily. 05/14/19  Yes Vann, Jessica U, DO  cetirizine (ZYRTEC) 10 MG tablet Take 10 mg by mouth daily.   Yes [provider]  ciprofloxacin (CIPRO) 500 MG tablet Take 1 tablet (500 mg total) by mouth 2 (two) times daily. Patient taking differently: Take 500 mg by mouth 2 (two) times daily. Start date : 08/06/19 08/06/19  Yes Volanda Napoleon, MD  diphenoxylate-atropine (LOMOTIL) 2.5-0.025 MG tablet Take 1-2 tablets by mouth 4 (four) times daily as needed for diarrhea or loose stools. Patient taking differently: Take 2 tablets by mouth 4 (four) times daily as needed for diarrhea or loose stools.  07/14/19  Yes Truitt Merle, MD  guaifenesin (HUMIBID E) 400 MG TABS tablet Take 1 tablet (400 mg total) by mouth every 6 (six) hours as needed. Patient taking differently: Take 400 mg by mouth in the morning and at bedtime.  05/13/19  Yes Vann, Jessica U, DO  ibuprofen (ADVIL) 200 MG tablet Take 200 mg by mouth every 6 (six) hours as needed for headache.   Yes [provider]  lidocaine-prilocaine (EMLA) cream Apply 1 application topically as needed. Patient taking differently: Apply 1 application topically as needed (To access the port).  06/23/19  Yes Truitt Merle, MD  loperamide (IMODIUM) 2 MG capsule Take 1-2 capsules (2-4 mg total) by mouth every 6 (six) hours as needed for diarrhea or loose stools. 07/14/19  Yes Truitt Merle, MD  magnesium oxide (MAG-OX) 400 (241.3 Mg) MG tablet Take 1 tablet (400 mg total) by mouth daily. Patient taking differently: Take 400 mg by mouth every other day.  05/14/19  Yes Vann, Jessica U, DO  ondansetron (ZOFRAN) 8 MG tablet Take 1 tablet (8 mg total) by mouth 2 (two) times daily as needed. Start on day 3 after chemotherapy. Patient taking differently: Take 8 mg by mouth 2 (two) times daily as needed for nausea or vomiting. Start on day  3 after chemotherapy. 05/25/19  Yes Truitt Merle, MD  potassium chloride SA (KLOR-CON) 20 MEQ tablet Take 2 tablets (40 mEq total) by mouth daily. 06/08/19  Yes Truitt Merle, MD  sulindac (CLINORIL) 200 MG tablet Take 400 mg by mouth 2 (two) times daily.   Yes [provider]  feeding supplement, ENSURE ENLIVE, (ENSURE ENLIVE) LIQD Take 237 mLs by mouth 2 (two) times daily between meals. 08/09/19 09/08/19  Arrien, Jimmy Picket, MD  pantoprazole (PROTONIX) 40 MG tablet Take 1 tablet (40 mg total) by mouth daily. 08/10/19 09/09/19  Arrien, Jimmy Picket, MD  potassium chloride SA (KLOR-CON) 20 MEQ tablet Take 1 tablet (20 mEq total) by mouth daily. Patient not taking: Reported on 08/08/2019 07/14/19   Truitt Merle, MD  prochlorperazine (COMPAZINE) 10 MG tablet Take 1 tablet (10 mg total) by mouth every 6 (six) hours as needed (Nausea or vomiting). 05/25/19   Truitt Merle, MD    Current Facility-Administered Medications  Medication Dose Route Frequency Provider Last Rate Last Admin  . acetaminophen (TYLENOL) tablet 650 mg  650 mg Oral  Q6H PRN Arrien, Jimmy Picket, MD       Or  . acetaminophen (TYLENOL) suppository 650 mg  650 mg Rectal Q6H PRN Arrien, Jimmy Picket, MD      . Chlorhexidine Gluconate Cloth 2 % PADS 6 each  6 each Topical Daily Truitt Merle, MD   6 each at 08/09/19 0915  . ciprofloxacin (CIPRO) tablet 500 mg  500 mg Oral BID Tawni Millers, MD   500 mg at 08/09/19 3704  . enoxaparin (LOVENOX) injection 40 mg  40 mg Subcutaneous Q24H Tawni Millers, MD   40 mg at 08/08/19 2249  . feeding supplement (ENSURE ENLIVE) (ENSURE ENLIVE) liquid 237 mL  237 mL Oral BID BM Arrien, Jimmy Picket, MD   237 mL at 08/09/19 0916  . heparin lock flush 100 unit/mL  500 Units Intracatheter Prior to discharge Arrien, Jimmy Picket, MD      . ondansetron [facility]) tablet 4 mg  4 mg Oral Q6H PRN Arrien, Jimmy Picket, MD       Or  . ondansetron [facility]) injection 4 mg  4 mg Intravenous Q6H  PRN Arrien, Jimmy Picket, MD      . pantoprazole (PROTONIX) EC tablet 40 mg  40 mg Oral Daily Tawni Millers, MD   40 mg at 08/09/19 8889  . sodium chloride flush (NS) 0.9 % injection 10-40 mL  10-40 mL Intracatheter PRN Truitt Merle, MD        Allergies as of 08/08/2019  . (No Known Allergies)    Family History  Problem Relation Age of Onset  . Lymphoma Mother 23       cancer in lymph nodes, unsure of primary  . Heart attack Father   . Lupus Sister     Social History   Socioeconomic History  . Marital status: Divorced    Spouse name: Not on file  . Number of children: 1  . Years of education: Not on file  . Highest education level: Not on file  Occupational History  . Occupation: disabled veteran   Tobacco Use  . Smoking status: Never Smoker  . Smokeless tobacco: Never Used  . Tobacco comment: plan to quit completely   Substance and Sexual Activity  . Alcohol use: Yes    Alcohol/week: 22.0 standard drinks    Types: 12 Cans of beer, 10 Shots of liquor per week  . Drug use: Not Currently  . Sexual activity: Not on file  Other Topics Concern  . Not on file  Social History Narrative  . Not on file   Social Determinants of Health   Financial Resource Strain:   . Difficulty of Paying Living Expenses:   Food Insecurity:   . Worried About Charity fundraiser in the Last Year:   . Arboriculturist in the Last Year:   Transportation Needs:   . Film/video editor (Medical):   Marland Kitchen Lack of Transportation (Non-Medical):   Physical Activity:   . Days of Exercise per Week:   . Minutes of Exercise per Session:   Stress:   . Feeling of Stress :   Social Connections:   . Frequency of Communication with Friends and Family:   . Frequency of Social Gatherings with Friends and Family:   . Attends Religious Services:   . Active Member of Clubs or Organizations:   . Attends Archivist Meetings:   Marland Kitchen Marital Status:   Intimate Partner Violence:   . Fear of  Current or  Ex-Partner:   . Emotionally Abused:   Marland Kitchen Physically Abused:   . Sexually Abused:     Review of Systems: Pertinent positive and negative review of systems were noted in the above HPI section.  All other review of systems was otherwise negative.  Physical Exam: Vital signs in last 24 hours: Temp:  [97.6 F (36.4 C)-98.9 F (37.2 C)] 97.6 F (36.4 C) (07/06 1420) Pulse Rate:  [58-72] 62 (07/06 1420) Resp:  [16-22] 16 (07/06 1420) BP: (96-132)/(57-93) 122/75 (07/06 1420) SpO2:  [96 %-100 %] 100 % (07/06 1420) Weight:  [73.5 kg] 73.5 kg (07/05 1522) Last BM Date: 08/07/19 General:   Alert,  Well-developed, chronically ill-appearing older African-American male pleasant and cooperative in NAD.  Eating lunch Head:  Normocephalic and atraumatic. Eyes:  Sclera early icterus  conjunctiva pink. Ears:  Normal auditory acuity. Nose:  No deformity, discharge,  or lesions. Mouth:  No deformity or lesions.   Neck:  Supple; no masses or thyromegaly. Lungs:  Clear throughout to auscultation.   No wheezes, crackles, or rhonchi. Heart:  Regular rate and rhythm; no murmurs, clicks, rubs,  or gallops. Abdomen:  Soft,nontender, BS active,nonpalp mass or hsm.   Rectal:  Deferred  Msk:  Symmetrical without gross deformities. . Pulses:  Normal pulses noted. Extremities:  Without clubbing or edema. Neurologic:  Alert and  oriented x4;  grossly normal neurologically. Skin:  Intact without significant lesions or rashes.. Psych:  Alert and cooperative. Normal mood and affect.  Intake/Output from previous day: 07/05 0701 - 07/06 0700 In: 760 [P.O.:120; Blood:640] Out: 275 [Urine:275] Intake/Output this shift: Total I/O In: 240 [P.O.:240] Out: 200 [Urine:200]  Lab Results: Recent Labs    08/08/19 1544 08/09/19 0814  WBC 9.7 6.6  HGB 7.0* 8.9*  HCT 21.6* 27.1*  PLT 321 310   BMET Recent Labs    08/08/19 1544 08/09/19 0814  NA 135 135  K 3.3* 3.7  CL 100 101  CO2 25 24   GLUCOSE 111* 98  BUN 13 11  CREATININE 1.05 0.88  CALCIUM 8.9 8.8*   LFT Recent Labs    08/08/19 1544 08/08/19 1544 08/09/19 0814  PROT 6.8   < > 6.6  ALBUMIN 2.9*   < > 2.6*  AST 36   < > 36  ALT 34   < > 35  ALKPHOS 161*   < > 143*  BILITOT 2.7*   < > 3.7*  BILIDIR 1.2*  --   --   IBILI 1.5*  --   --    < > = values in this interval not displayed.   PT/INR No results for input(s): LABPROT, INR in the last 72 hours. Hepatitis Panel No results for input(s): HEPBSAG, HCVAB, HEPAIGM, HEPBIGM in the last 72 hours.    IMPRESSION:  #61 64 year old African-American male with pancreatic adenocarcinoma stage III diagnosed April 2021 when he presented with obstructive jaundice.  He underwent ERCP with plastic stent placement initially and then a few weeks later due to progressive jaundice had second ERCP with finding of stent migration.  Stent was pulled and a new 10 French 6 cm covered metal stent was placed. Patient has had a mild rise in bilirubin over the past couple of weeks, but no progressive trend upward. Is not clear that the stent is totally occluded. He may have some partial occlusion, he has also been undergoing chemotherapy. He was noted to have leukocytosis about 5 days ago and was started on Cipro, WBC normal on  admit yesterday  #2 progressive anemia, symptomatic with fatigue and shortness of breath felt secondary to chemotherapy.  Patient has received 2 units of packed RBCs with good response.  Plan; trend LFTs, keep patient in the hospital for another 48 hours Will schedule for MRI/MRCP for tomorrow.  ERCP can be done later this week if indicated. Restart Cipro 500 mg p.o. twice daily Thank you, will follow with you   Jermari Tamargo PA-C 08/09/2019, 2:40 PM

## 2019-08-09 NOTE — Progress Notes (Addendum)
HEMATOLOGY-ONCOLOGY PROGRESS NOTE  SUBJECTIVE: Reported that he was very weak yesterday but feels much better today.  He has no abdominal pain, nausea, vomiting.  Afebrile and other vital signs are stable.  Oncology History Overview Note  Cancer Staging Malignant neoplasm of pancreas [facility]) Staging form: Exocrine Pancreas, AJCC 8th Edition - Clinical stage from 05/19/2019: Stage III (cT4, cN1, cM0) - Signed by Truitt Merle, MD on 05/24/2019    Malignant neoplasm of pancreas ([place])  05/10/2019 Tumor Marker   Baseline  CEA at 31.1 Ca 19-9 at 2411   05/11/2019 Procedure   ERCP by Dr Henrene Pastor 05/11/19  IMPRESSION 1. Malignant appearing distal bile duct stricture with upstream dilation. Status post ERCP with sphincterotomy and biliary stent placement   05/13/2019 Imaging   CT Chest and Pancreas 05/13/19 IMPRESSION: 1. Interval placement of common bile duct stent with decompression of the bile ducts. Pneumobilia is now noted compatible with biliary patency. 2. Diffuse infiltrative process involving the head, neck, body and tail of pancreas is identified. The diffusely infiltrative appearance of the pancreas is somewhat unusual. Although favored to represent pancreatic adenocarcinoma, other etiologies to consider include IgG4-related Sclerosing Disease of the pancreas. 3. There is encasement and narrowing of the portal venous confluence and proximal portal vein. Mild soft tissue stranding extends to but does not encase the superior mesenteric artery. No convincing evidence for involvement of the celiac trunk. 4. Borderline enlarged portacaval node. No convincing evidence for liver metastasis or metastatic disease to the chest. 5. Aortic atherosclerosis. Aortic Atherosclerosis (ICD10-I70.0).   05/19/2019 Initial Diagnosis   Malignant neoplasm of pancreas ([place])   05/19/2019 Cancer Staging   Staging form: Exocrine Pancreas, AJCC 8th Edition - Clinical stage from 05/19/2019: Stage III (cT4, cN1, cM0) -  Signed by Truitt Merle, MD on 05/24/2019   05/19/2019 Procedure   EUS by Dr Ardis Hughs 05/19/19 - Large mass involving much of the pancreatic parenchyma, clear encasing the PV and possibly involving the SMA as well. The mass was sampled with 3 transduodenal EUS FNB passes and the preliminary cytology was positive for malignancy (adenocarcinoma). - Previously placed plastic biliary stent was in the CBD in good position.    05/19/2019 Initial Biopsy   A. 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   06/08/2019 Procedure   PAC placed    06/16/2019 -  Chemotherapy   FOLFIRINOX q2weeks starting 06/16/19      REVIEW OF SYSTEMS:   Constitutional: Denies fevers, chills.  Fatigue improved. Respiratory: Denies cough, dyspnea or wheezes Cardiovascular: Denies palpitation, chest discomfort Gastrointestinal:  Denies nausea, heartburn or change in bowel habits Skin: Denies abnormal skin rashes Lymphatics: Denies new lymphadenopathy or easy bruising Neurological:Denies numbness, tingling or new weaknesses Behavioral/Psych: Mood is stable, no new changes  Extremities: No lower extremity edema All other systems were reviewed with the patient and are negative.  I have reviewed the past medical history, past surgical history, social history and family history with the patient and they are unchanged from previous note.   PHYSICAL EXAMINATION: ECOG PERFORMANCE STATUS: 2 - Symptomatic, <50% confined to bed  Vitals:   08/09/19 0448 08/09/19 0849  BP: 125/72 116/61  Pulse: (!) 58 (!) 59  Resp: 20 18  Temp: 97.9 F (36.6 C) 98 F (36.7 C)  SpO2: 100% 100%  Filed Weights   08/08/19 1522  Weight: 73.5 kg    Intake/Output from previous day: 07/05 0701 - 07/06 0700 In: 760 [P.O.:120;  Blood:640] Out: 275 [Urine:275]  GENERAL:alert, no distress and comfortable SKIN: skin color, texture, turgor are normal, no rashes or significant lesions EYES: Mild scleral icterus OROPHARYNX:no exudate, no erythema and lips, buccal mucosa, and tongue normal  NECK: supple, thyroid normal size, non-tender, without nodularity LYMPH:  no palpable lymphadenopathy in the cervical, axillary or inguinal LUNGS: clear to auscultation and percussion with normal breathing effort HEART: regular rate & rhythm and no murmurs and no lower extremity edema ABDOMEN:abdomen soft, non-tender and normal bowel sounds Musculoskeletal:no cyanosis of digits and no clubbing  NEURO: alert & oriented x 3 with fluent speech, no focal motor/sensory deficits  LABORATORY DATA:  I have reviewed the data as listed CMP Latest Ref Rng & Units 08/09/2019 08/08/2019 08/04/2019  Glucose 70 - 99 mg/dL 98 111(H) 119(H)  BUN 8 - 23 mg/dL 11 13 13   Creatinine 0.61 - 1.24 mg/dL 0.88 1.05 1.29(H)  Sodium 135 - 145 mmol/L 135 135 134(L)  Potassium 3.5 - 5.1 mmol/L 3.7 3.3(L) 3.6  Chloride 98 - 111 mmol/L 101 100 97(L)  CO2 22 - 32 mmol/L 24 25 22   Calcium 8.9 - 10.3 mg/dL 8.8(L) 8.9 9.4  Total Protein 6.5 - 8.1 g/dL 6.6 6.8 7.2  Total Bilirubin 0.3 - 1.2 mg/dL 3.7(H) 2.7(H) 5.1(HH)  Alkaline Phos 38 - 126 U/L 143(H) 161(H) 210(H)  AST 15 - 41 U/L 36 36 44(H)  ALT 0 - 44 U/L 35 34 43    Lab Results  Component Value Date   WBC 6.6 08/09/2019   HGB 8.9 (L) 08/09/2019   HCT 27.1 (L) 08/09/2019   MCV 87.4 08/09/2019   PLT 310 08/09/2019   NEUTROABS 6.5 08/08/2019    DG Chest 1 View  Result Date: 08/08/2019 CLINICAL DATA:  History of pancreatic cancer presenting with shortness of breath. EXAM: CHEST  1 VIEW COMPARISON:  None. FINDINGS: A right-sided venous Port-A-Cath is seen with its distal tip noted near the junction of the superior vena cava and right atrium. There is no evidence of acute infiltrate, pleural effusion or  pneumothorax. The heart size and mediastinal contours are within normal limits. The visualized skeletal structures are unremarkable. IMPRESSION: No active disease. Electronically Signed   By: Virgina Norfolk M.D.   On: 08/08/2019 16:29   US Abdomen Complete  Result Date: 08/04/2019 CLINICAL DATA:  Pancreatic carcinoma with elevated liver enzymes. Biliary duct stent placement. EXAM: ABDOMEN ULTRASOUND COMPLETE COMPARISON:  CT abdomen and pelvis May 13, 2019 FINDINGS: Gallbladder: Gallbladder contains sludge with intermingled 1-2 mm calculi. Gallbladder mildly distended. No gallbladder wall thickening or pericholecystic fluid. Common bile duct: Diameter: 6 mm. Stent present within the biliary duct. There is pneumobilia. Liver: No focal lesion identified. Within normal limits in parenchymal echogenicity. Portal vein is patent on color Doppler imaging with normal direction of blood flow towards the liver. IVC: No abnormality visualized. Pancreas: Visualized portion unremarkable. Note that most of pancreas obscured by gas. Spleen: Size and appearance within normal limits. Right Kidney: Length: 9.8 cm. Echogenicity is increased. No hydronephrosis visualized. 0.8 x 0.7 x 0.9 cm cyst arises from the upper pole the right kidney. Left Kidney: Length: 11.3 cm. Echogenicity is increased. No hydronephrosis visualized. There is a cyst arising from the upper pole of the left kidney measuring 2.7 x 1.7 x 2.0 cm. A 2.2 x 1.7 x 2.0 cm  cyst is also noted arising from the mid left kidney. A subcentimeter cyst is also noted in the mid left kidney. Abdominal aorta: No aneurysm visualized. Other findings: No demonstrable ascites. IMPRESSION: 1. Gallbladder mildly distended with sludge and tiny gallstones. No gallbladder wall thickening or pericholecystic fluid. 2. Biliary stent present with pneumobilia, likely due to stent present. 3. Much of pancreas obscured by gas. Visualized portions of pancreas appear grossly unremarkable. 4.  Increased renal echogenicity, likely indicative of medical renal disease. No obstructing focus in either kidney. Cysts noted in each kidney. Electronically Signed   By: Lowella Grip III M.D.   On: 08/04/2019 12:58    ASSESSMENT AND PLAN: 1.  Symptomatic anemia, improved 2.  Stage III pancreatic adenocarcinoma 3.  Transaminitis and hyperbilirubinemia, improving 4.  Hypertension  -The patient is feeling much better today after receiving 2 units PRBCs.  Hemoglobin has improved to 8.9.  Recommend close monitoring and transfuse for hemoglobin less than 7. -LFTs and total bilirubin are slowly improving.  We will continue to closely monitor these. -The patient has outpatient follow-up already scheduled at the cancer center on 08/10/2019.  I note plan for possible discharge today and will keep this appointment as scheduled.   LOS: 0 days   Mikey Bussing, DNP, AGPCNP-BC, AOCNP 08/09/19   Addendum  Pt's CMP today came back in later morning, I noticed his tbil went up to 3.7 today, and called GI Dr. Lyndel Safe for consulting, to see if he needs further image study and or biliary stent exchange. I spoke with his APP Amy also, Appreciate their input. Dr. Cathlean Sauer was made aware, and discharge will be held today.   I will f/u tomorrow and cancel his outpt appointments tomorrow.  Truitt Merle  08/09/2019

## 2019-08-09 NOTE — Progress Notes (Signed)
Blood transfusion 1/2 completed. No reactions noted. Patient tolerated well. Lungs assessed for fluid overload. Second unit to be transfused. Pt aware.

## 2019-08-09 NOTE — TOC Progression Note (Signed)
Transition of Care [facility]) - Progression Note    Patient Details  Name: Isaac Doyle MRN: [id number] Date of Birth: Jul 17, 1955  [facility] of [facility]) CM/SW Contact  Joaquin Courts, RN Phone Number: 08/09/2019, 3:57 PM  Clinical Narrative:   CM received notification that Nanine Means will not be able to service patient due to staffing.  Patient now [facility] with [facility] for HHPT/RN    Expected Discharge Plan: [place] Barriers to Discharge: No Barriers Identified  Expected Discharge Plan and Services Expected Discharge Plan: [place]   Discharge Planning Services: CM Consult Post Acute Care Choice: [place] arrangements for the past 2 months: Apartment Expected Discharge Date: 08/09/19               DME Arranged: N/A DME Agency: NA       HH Arranged: PT, RN HH Agency: [facility] Date HH Agency Contacted: 08/09/19 Time [place]: 68 Representative spoke with at [place]: Amy   Social Determinants of Health ([place]) Interventions    Readmission Risk Interventions No flowsheet data found.

## 2019-08-09 NOTE — Progress Notes (Signed)
Case discussed with Dr. Burr Medico from oncology.  Considering patient's recent diagnosis of cholangitis, patient was seen by gastroenterology, with recommendations to check MRCP in a.m.  Discharge will be placed on hold for now.

## 2019-08-09 NOTE — Progress Notes (Signed)
Unit 1/2 started on patient per policy. No reactions noted after 15 min start. Pt made aware of s/s to be monitored and to voice if feeling unwell. Rate increased. Will continue to monitor.

## 2019-08-09 NOTE — Progress Notes (Signed)
Blood transfusion 2/2 completed. Pt tolerated well no s/s of reaction noted. IV team aware to come to draw labs at around 0800. Will continue to monitor.

## 2019-08-09 NOTE — Progress Notes (Signed)
Unit 2/2 started. With no reaction after first 15 minutes. Pt aware of s/s of reactions and verbalized to call for assistance if not feeling well. Pt resting comfortable. Rate increased. Will continue to monitor for changes or s/s.

## 2019-08-09 NOTE — Evaluation (Signed)
Physical Therapy Evaluation-1x Patient Details Name: Isaac Doyle MRN: [id number] DOB: 01-10-1956 Today's Date: 08/09/2019   History of Present Illness  64 yo male admitted with suspected anemia. Hx of pancreatic ca, anemia  Clinical Impression  On eval, pt was Min guard assist for mobility. He walked ~150 feet with his walking stick. Mildly unsteady but no overt LOB. Pt denied dizziness. O2 98% on RA. Pt plans to return home to his apt. Will recommend HHPT f/u for a few visits to ensure he returns to his baseline of functioning. 1x eval. Pt is set to d/c home later today.     Follow Up Recommendations Home health PT;Supervision - Intermittent    Equipment Recommendations  None recommended by PT    Recommendations for Other Services       Precautions / Restrictions Precautions Precautions: Fall Restrictions Weight Bearing Restrictions: No      Mobility  Bed Mobility Overal bed mobility: Modified Independent                Transfers Overall transfer level: Needs assistance   Transfers: Sit to/from Stand Sit to Stand: Supervision         General transfer comment: for safety.  Ambulation/Gait Ambulation/Gait assistance: Min guard Gait Distance (Feet): 150 Feet Assistive device: Straight cane Gait Pattern/deviations: Wide base of support;Step-through pattern     General Gait Details: Min guard for safety. Mildly unsteady but no overt LOB. Pt tolerated distance well. O2 98% on RA  Stairs            Wheelchair Mobility    Modified Rankin (Stroke Patients Only)       Balance Overall balance assessment: Mild deficits observed, not formally tested                                           Pertinent Vitals/Pain Pain Assessment: No/denies pain    Home Living Family/patient expects to be discharged to:: Private residence Living Arrangements: Alone Available Help at Discharge: Family;Available PRN/intermittently;Neighbor Type  of Home: Apartment Home Access: Level entry     Home Layout: One level Home Equipment: Cane - single point      Prior Function Level of Independence: Independent with assistive device(s)         Comments: neighbors/family assist with meals and other household tasks as needed     Hand Dominance        Extremity/Trunk Assessment   Upper Extremity Assessment Upper Extremity Assessment: Overall WFL for tasks assessed    Lower Extremity Assessment Lower Extremity Assessment: Generalized weakness    Cervical / Trunk Assessment Cervical / Trunk Assessment: Kyphotic  Communication   Communication: No difficulties  Cognition Arousal/Alertness: Awake/alert Behavior During Therapy: WFL for tasks assessed/performed Overall Cognitive Status: Within Functional Limits for tasks assessed                                        General Comments      Exercises     Assessment/Plan    PT Assessment All further PT needs can be met in the next venue of care (HHPT)  PT Problem List Decreased mobility;Decreased balance;Decreased strength       PT Treatment Interventions      PT Goals (Current goals can be found in the  Care Plan section)  Acute Rehab PT Goals Patient Stated Goal: home PT Goal Formulation: All assessment and education complete, DC therapy    Frequency Min 3X/week   Barriers to discharge        Co-evaluation               AM-PAC PT "6 Clicks" Mobility  Outcome Measure Help needed turning from your back to your side while in a flat bed without using bedrails?: None Help needed moving from lying on your back to sitting on the side of a flat bed without using bedrails?: None Help needed moving to and from a bed to a chair (including a wheelchair)?: A Little Help needed standing up from a chair using your arms (e.g., wheelchair or bedside chair)?: A Little Help needed to walk in hospital room?: A Little Help needed climbing 3-5 steps  with a railing? : A Little 6 Click Score: 20    End of Session Equipment Utilized During Treatment: Gait belt Activity Tolerance: Patient tolerated treatment well Patient left: in chair;with call bell/phone within reach   PT Visit Diagnosis: Muscle weakness (generalized) (M62.81);Unsteadiness on feet (R26.81)    Time: 8001-2393 PT Time Calculation (min) (ACUTE ONLY): 13 min   Charges:   PT Evaluation $PT Eval Low Complexity: [place], PT Acute Rehabilitation  Office: [phone] Pager: [phone]

## 2019-08-09 NOTE — Discharge Summary (Signed)
Physician Discharge Summary  Isaac Doyle MVH:[id number] DOB: November 04, 1955 DOA: 08/08/2019  PCP: System, Pcp Not In  Admit date: 08/08/2019 Discharge date: 08/09/2019  Admitted From: Home  Disposition:  Home   Recommendations for Outpatient Follow-up and new medication changes:  1. Follow up with Primary Care in 7 days.  2. Follow up with Dr Burr Medico as scheduled. 3. Added pantoprazole daily for GI prophylaxis.   Home Health: no   Equipment/Devices: no    Discharge Condition: stable  CODE STATUS: full Diet recommendation: heart healthy   Brief/Interim Summary: Patient was admitted to the hospital with working diagnosis of symptomatic anemia, likely malignancy related.  64 year old male with history of pancreatic cancer complicated by biliary obstruction status post stent placement.  As an outpatient he has been diagnosed with chemotherapy-induced anemia and possible cholangitis (placed on oral ciprofloxacin).  He presented with 2 weeks of progressive easy fatigability and dyspnea on exertion. No melena or hematochezia. On his initial physical examination his blood pressure was 96/57, heart rate 68, respiratory rate 16, oxygen saturation 100% on room air, temperature 98.  He had mild to moderate pallor, no icterus, lungs clear to auscultation bilaterally, heart S1-S2, present and rhythmic, his abdomen was soft, he did not have significant lower extremity edema. Sodium 135, potassium 3.3, chloride 100, bicarb 25, glucose 111, BUN 13, creatinine 1.0, alkaline phosphatase 161, AST 36, ALT 34, total bilirubin is 2.7 down from 5.1, white count 9.7, hemoglobin 7.0, hematocrit 21.6, platelets 321.  SARS COVID-19 negative.  Chest radiograph with no infiltrates, positive port right internal jugular vein.  1.  Symptomatic anemia, in the setting of pancreatic cancer, under chemotherapy.  Patient was admitted to the medical ward, he received 2 units packed red blood cell transfusion with good toleration.  No  signs of bleeding.  He had significant improvement of his symptoms.  His discharge hemoglobin is 8.9, hematocrit 27.1, further work-up with iron stores showed serum iron 22, TIBC 182, transferrin saturation 12, ferritin 1259, transferrin 130, consistent with anemia of chronic disease with mild iron deficiency.  Will add pantoprazole for GI prophylaxis.  Patient will follow up with oncology as an outpatient.  2.  Pancreatic adenocarcinoma, head/neck/body status post.  Stent placement.  Patient had no abdominal pain, no nausea or vomiting.  He was continued on ciprofloxacin.  His liver profile at discharge showed AST 35 6, ALT 35, total bilirubin is 3.7.  Continue ciprofloxacin and follow-up as an outpatient.  3.  Hypertension.  His blood pressure remained well controlled.  4.  Hypokalemia.  He received potassium chloride, his discharge potassium was 3.7, stable kidney function with a serum creatinine of 0.8 and serum bicarb 24.  5.  History of alcohol abuse.  No signs of alcohol withdrawal.  Follow-up as an outpatient.    Discharge Diagnoses:  Principal Problem:   Symptomatic anemia Active Problems:   Essential hypertension   Normochromic normocytic anemia   Malignant biliary obstruction (HCC)   Biliary stricture   Malignant neoplasm of pancreas [name])    Discharge Instructions  Discharge Instructions    Diet - low sodium heart healthy   Complete by: As directed    Discharge instructions   Complete by: As directed    Please follow up with primary care in 7 days.   Increase activity slowly   Complete by: As directed      Allergies as of 08/09/2019   No Known Allergies     Medication List    STOP taking  these medications   ondansetron 8 MG tablet Commonly known as: Zofran   prochlorperazine 10 MG tablet Commonly known as: COMPAZINE     TAKE these medications   Advil 200 MG tablet Generic drug: ibuprofen Take 200 mg by mouth every 6 (six) hours as needed for  headache.   atenolol 100 MG tablet Commonly known as: TENORMIN Take 1 tablet (100 mg total) by mouth daily.   cetirizine 10 MG tablet Commonly known as: ZYRTEC Take 10 mg by mouth daily.   ciprofloxacin 500 MG tablet Commonly known as: Cipro Take 1 tablet (500 mg total) by mouth 2 (two) times daily. What changed: additional instructions   diphenoxylate-atropine 2.5-0.025 MG tablet Commonly known as: LOMOTIL Take 1-2 tablets by mouth 4 (four) times daily as needed for diarrhea or loose stools. What changed: how much to take   feeding supplement (ENSURE ENLIVE) Liqd Take 237 mLs by mouth 2 (two) times daily between meals.   guaifenesin 400 MG Tabs tablet Commonly known as: HUMIBID E Take 1 tablet (400 mg total) by mouth every 6 (six) hours as needed. What changed: when to take this   lidocaine-prilocaine cream Commonly known as: EMLA Apply 1 application topically as needed. What changed: reasons to take this   loperamide 2 MG capsule Commonly known as: IMODIUM Take 1-2 capsules (2-4 mg total) by mouth every 6 (six) hours as needed for diarrhea or loose stools.   magnesium oxide 400 (241.3 Mg) MG tablet Commonly known as: MAG-OX Take 1 tablet (400 mg total) by mouth daily. What changed: when to take this   pantoprazole 40 MG tablet Commonly known as: PROTONIX Take 1 tablet (40 mg total) by mouth daily. Start taking on: August 10, 2019   potassium chloride SA 20 MEQ tablet Commonly known as: KLOR-CON Take 2 tablets (40 mEq total) by mouth daily. What changed: Another medication with the same name was removed. Continue taking this medication, and follow the directions you see here.   sulindac 200 MG tablet Commonly known as: CLINORIL Take 400 mg by mouth 2 (two) times daily.       No Known Allergies      Procedures/Studies: DG Chest 1 View  Result Date: 08/08/2019 CLINICAL DATA:  History of pancreatic cancer presenting with shortness of breath. EXAM: CHEST   1 VIEW COMPARISON:  None. FINDINGS: A right-sided venous Port-A-Cath is seen with its distal tip noted near the junction of the superior vena cava and right atrium. There is no evidence of acute infiltrate, pleural effusion or pneumothorax. The heart size and mediastinal contours are within normal limits. The visualized skeletal structures are unremarkable. IMPRESSION: No active disease. Electronically Signed   By: Virgina Norfolk M.D.   On: 08/08/2019 16:29   US Abdomen Complete  Result Date: 08/04/2019 CLINICAL DATA:  Pancreatic carcinoma with elevated liver enzymes. Biliary duct stent placement. EXAM: ABDOMEN ULTRASOUND COMPLETE COMPARISON:  CT abdomen and pelvis May 13, 2019 FINDINGS: Gallbladder: Gallbladder contains sludge with intermingled 1-2 mm calculi. Gallbladder mildly distended. No gallbladder wall thickening or pericholecystic fluid. Common bile duct: Diameter: 6 mm. Stent present within the biliary duct. There is pneumobilia. Liver: No focal lesion identified. Within normal limits in parenchymal echogenicity. Portal vein is patent on color Doppler imaging with normal direction of blood flow towards the liver. IVC: No abnormality visualized. Pancreas: Visualized portion unremarkable. Note that most of pancreas obscured by gas. Spleen: Size and appearance within normal limits. Right Kidney: Length: 9.8 cm. Echogenicity is increased. No  hydronephrosis visualized. 0.8 x 0.7 x 0.9 cm cyst arises from the upper pole the right kidney. Left Kidney: Length: 11.3 cm. Echogenicity is increased. No hydronephrosis visualized. There is a cyst arising from the upper pole of the left kidney measuring 2.7 x 1.7 x 2.0 cm. A 2.2 x 1.7 x 2.0 cm cyst is also noted arising from the mid left kidney. A subcentimeter cyst is also noted in the mid left kidney. Abdominal aorta: No aneurysm visualized. Other findings: No demonstrable ascites. IMPRESSION: 1. Gallbladder mildly distended with sludge and tiny gallstones. No  gallbladder wall thickening or pericholecystic fluid. 2. Biliary stent present with pneumobilia, likely due to stent present. 3. Much of pancreas obscured by gas. Visualized portions of pancreas appear grossly unremarkable. 4. Increased renal echogenicity, likely indicative of medical renal disease. No obstructing focus in either kidney. Cysts noted in each kidney. Electronically Signed   By: Lowella Grip III M.D.   On: 08/04/2019 12:58      Subjective: Patient is feeling better, no nausea or vomiting, dyspnea and easy fatigability have improved. No chest pain.   Discharge Exam: Vitals:   08/09/19 0448 08/09/19 0849  BP: 125/72 116/61  Pulse: (!) 58 (!) 59  Resp: 20 18  Temp: 97.9 F (36.6 C) 98 F (36.7 C)  SpO2: 100% 100%   Vitals:   08/09/19 0230 08/09/19 0235 08/09/19 0448 08/09/19 0849  BP: 110/68  125/72 116/61  Pulse:   (!) 58 (!) 59  Resp: 20  20 18   Temp: 97.9 F (36.6 C)  97.9 F (36.6 C) 98 F (36.7 C)  TempSrc: Oral  Oral   SpO2: 100% 100% 100% 100%  Weight:      Height:        General: Not in pain or dyspnea.  Neurology: Awake and alert, non focal  E ENT: mild pallor, no icterus, oral mucosa moist Cardiovascular: No JVD. S1-S2 present, rhythmic, no gallops, rubs, or murmurs. No lower extremity edema. Pulmonary: positive breath sounds bilaterally, adequate air movement, no wheezing, rhonchi or rales. Gastrointestinal. Abdomen with  no organomegaly, non tender, no rebound or guarding Skin. No rashes Musculoskeletal: no joint deformities   The results of significant diagnostics from this hospitalization (including imaging, microbiology, ancillary and laboratory) are listed below for reference.     Microbiology: Recent Results (from the past 240 hour(s))  SARS Coronavirus 2 by RT PCR (hospital order, performed in [facility] hospital lab) Nasopharyngeal Nasopharyngeal Swab     Status: None   Collection Time: 08/08/19  4:26 PM   Specimen: Nasopharyngeal  Swab  Result Value Ref Range Status   SARS Coronavirus 2 NEGATIVE NEGATIVE Final    Comment: (NOTE) SARS-CoV-2 target nucleic acids are NOT DETECTED.  The SARS-CoV-2 RNA is generally detectable in upper and lower respiratory specimens during the acute phase of infection. The lowest concentration of SARS-CoV-2 viral copies this assay can detect is 250 copies / mL. A negative result does not preclude SARS-CoV-2 infection and should not be used as the sole basis for treatment or other patient management decisions.  A negative result may occur with improper specimen collection / handling, submission of specimen other than nasopharyngeal swab, presence of viral mutation(s) within the areas targeted by this assay, and inadequate number of viral copies (<250 copies / mL). A negative result must be combined with clinical observations, patient history, and epidemiological information.  Fact Sheet for Patients:   StrictlyIdeas.no  Fact Sheet for Healthcare Providers: BankingDealers.co.za  This test  is not yet approved or  cleared by the [place] and has been authorized for detection and/or diagnosis of SARS-CoV-2 by FDA under an Emergency Use Authorization (EUA).  This EUA will remain in effect (meaning this test can be used) for the duration of the COVID-19 declaration under Section 564(b)(1) of the Act, 21 U.S.C. section 360bbb-3(b)(1), unless the authorization is terminated or revoked sooner.  Performed at [facility], [place] [address] [place], [place] [address]      Labs: BNP (last 3 results) Recent Labs    08/08/19 1544  BNP 42.5   Basic Metabolic Panel: Recent Labs  Lab 08/04/19 0757 08/08/19 1544 08/09/19 0814  NA 134* 135 135  K 3.6 3.3* 3.7  CL 97* 100 101  CO2 22 25 24   GLUCOSE 119* 111* 98  BUN 13 13 11   CREATININE 1.29* 1.05 0.88  CALCIUM 9.4 8.9 8.8*   Liver Function Tests: Recent  Labs  Lab 08/04/19 0757 08/08/19 1544 08/09/19 0814  AST 44* 36 36  ALT 43 34 35  ALKPHOS 210* 161* 143*  BILITOT 5.1* 2.7* 3.7*  PROT 7.2 6.8 6.6  ALBUMIN 2.7* 2.9* 2.6*   No results for input(s): LIPASE, AMYLASE in the last 168 hours. No results for input(s): AMMONIA in the last 168 hours. CBC: Recent Labs  Lab 08/04/19 0757 08/08/19 1544 08/09/19 0814  WBC 16.5* 9.7 6.6  NEUTROABS 12.9* 6.5  --   HGB 8.2* 7.0* 8.9*  HCT 25.0* 21.6* 27.1*  MCV 88.7 90.4 87.4  PLT 311 321 310   Cardiac Enzymes: No results for input(s): CKTOTAL, CKMB, CKMBINDEX, TROPONINI in the last 168 hours. BNP: Invalid input(s): POCBNP CBG: No results for input(s): GLUCAP in the last 168 hours. D-Dimer No results for input(s): DDIMER in the last 72 hours. Hgb A1c No results for input(s): HGBA1C in the last 72 hours. Lipid Profile No results for input(s): CHOL, HDL, LDLCALC, TRIG, CHOLHDL, LDLDIRECT in the last 72 hours. Thyroid function studies No results for input(s): TSH, T4TOTAL, T3FREE, THYROIDAB in the last 72 hours.  Invalid input(s): FREET3 Anemia work up Recent Labs    08/08/19 2110 08/09/19 0814  VITAMINB12 2,134*  --   FERRITIN 1,259*  --   TIBC 182*  --   IRON 22*  --   RETICCTPCT  --  2.4   Urinalysis    Component Value Date/Time   COLORURINE AMBER (A) 08/08/2019 2230   APPEARANCEUR CLEAR 08/08/2019 2230   LABSPEC 1.013 08/08/2019 2230   PHURINE 6.0 08/08/2019 2230   GLUCOSEU NEGATIVE 08/08/2019 2230   HGBUR NEGATIVE 08/08/2019 2230   BILIRUBINUR NEGATIVE 08/08/2019 2230   KETONESUR NEGATIVE 08/08/2019 2230   PROTEINUR NEGATIVE 08/08/2019 2230   NITRITE NEGATIVE 08/08/2019 2230   LEUKOCYTESUR NEGATIVE 08/08/2019 2230   Sepsis Labs Invalid input(s): PROCALCITONIN,  WBC,  LACTICIDVEN Microbiology Recent Results (from the past 240 hour(s))  SARS Coronavirus 2 by RT PCR (hospital order, performed in [place] hospital lab) Nasopharyngeal Nasopharyngeal Swab      Status: None   Collection Time: 08/08/19  4:26 PM   Specimen: Nasopharyngeal Swab  Result Value Ref Range Status   SARS Coronavirus 2 NEGATIVE NEGATIVE Final    Comment: (NOTE) SARS-CoV-2 target nucleic acids are NOT DETECTED.  The SARS-CoV-2 RNA is generally detectable in upper and lower respiratory specimens during the acute phase of infection. The lowest concentration of SARS-CoV-2 viral copies this assay can detect is 250 copies / mL. A negative result  does not preclude SARS-CoV-2 infection and should not be used as the sole basis for treatment or other patient management decisions.  A negative result may occur with improper specimen collection / handling, submission of specimen other than nasopharyngeal swab, presence of viral mutation(s) within the areas targeted by this assay, and inadequate number of viral copies (<250 copies / mL). A negative result must be combined with clinical observations, patient history, and epidemiological information.  Fact Sheet for Patients:   StrictlyIdeas.no  Fact Sheet for Healthcare Providers: BankingDealers.co.za  This test is not yet approved or  cleared by the [place] FDA and has been authorized for detection and/or diagnosis of SARS-CoV-2 by FDA under an Emergency Use Authorization (EUA).  This EUA will remain in effect (meaning this test can be used) for the duration of the COVID-19 declaration under Section 564(b)(1) of the Act, 21 U.S.C. section 360bbb-3(b)(1), unless the authorization is terminated or revoked sooner.  Performed at [facility], [place] [address], [place], [place] [address]      Time coordinating discharge: 45 minutes  SIGNED:   Tawni Millers, MD  [facility] 08/09/2019, 10:58 AM

## 2019-08-09 NOTE — TOC Initial Note (Signed)
Transition of Care [facility]) - Initial/Assessment Note    Patient Details  Name: Isaac Doyle MRN: [id number] Date of Birth: 19-Oct-1955  [facility] CM/SW Contact:    Joaquin Courts, RN Phone Number: 08/09/2019, 2:07 PM  Clinical Narrative:                 CM spoke with patient at bedside.  Patient set up with [facility] for [facility] and PT.   Expected Discharge Plan: [place] Barriers to Discharge: No Barriers Identified   Patient Goals and CMS Choice Patient states their goals for this hospitalization and ongoing recovery are:: to go home CMS Medicare.gov Compare Post Acute Care list provided to:: Patient Choice offered to / list presented to : Patient  Expected Discharge Plan and Services Expected Discharge Plan: [place]   Discharge Planning Services: CM Consult Post Acute Care Choice: [place] arrangements for the past 2 months: Apartment Expected Discharge Date: 08/09/19               DME Arranged: N/A DME Agency: NA       HH Arranged: PT, RN [place] Agency: [place] Date [place]: 08/09/19 Time [place]: 88 Representative spoke with at [place]: [place] Arrangements/Services Living arrangements for the past 2 months: Apartment   Patient language and need for interpreter reviewed:: Yes Do you feel safe going back to the place where you live?: Yes      Need for Family Participation in Patient Care: No (Comment) Care giver support system in place?: Yes (comment)   Criminal Activity/Legal Involvement Pertinent to Current Situation/Hospitalization: No - Comment as needed  Activities of Daily Living Home Assistive Devices/Equipment: Cane (specify quad or straight) (straight) ADL Screening (condition at time of admission) Patient's cognitive ability adequate to safely complete daily activities?: Yes Is the patient deaf or have difficulty hearing?: No Does  the patient have difficulty seeing, even when wearing glasses/contacts?: No Does the patient have difficulty concentrating, remembering, or making decisions?: No Patient able to express need for assistance with ADLs?: Yes Does the patient have difficulty dressing or bathing?: No Independently performs ADLs?: Yes (appropriate for developmental age) Does the patient have difficulty walking or climbing stairs?: Yes Weakness of Legs: Both Weakness of Arms/Hands: Both  Permission Sought/Granted                  Emotional Assessment Appearance:: Appears stated age Attitude/Demeanor/Rapport: Engaged Affect (typically observed): Accepting Orientation: : Oriented to Self, Oriented to Place, Oriented to  Time, Oriented to Situation   Psych Involvement: No (comment)  Admission diagnosis:  Symptomatic anemia [D64.9] Patient Active Problem List   Diagnosis Date Noted  . Symptomatic anemia 08/08/2019  . Biliary obstruction   . Malignant obstructive jaundice ([place]) 06/03/2019  . Malignant neoplasm of pancreas ([place])   . Biliary stricture   . ARF (acute renal failure) ([place]) 05/10/2019  . Essential hypertension 05/10/2019  . Hypokalemia 05/10/2019  . Normochromic normocytic anemia 05/10/2019  . Malignant biliary obstruction ([place])   . Jaundice 05/09/2019   PCP:  System, Pcp Not In Pharmacy:   [facility] [place], [place] [address] [place] [address] Phone: [phone] Fax: [phone]     Social Determinants of Health (SDOH) Interventions    Readmission Risk Interventions No flowsheet data found.

## 2019-08-10 ENCOUNTER — Inpatient Hospital Stay: Payer: No Typology Code available for payment source | Admitting: Nurse Practitioner

## 2019-08-10 ENCOUNTER — Inpatient Hospital Stay: Payer: No Typology Code available for payment source

## 2019-08-10 ENCOUNTER — Observation Stay (HOSPITAL_COMMUNITY): Payer: No Typology Code available for payment source

## 2019-08-10 DIAGNOSIS — I10 Essential (primary) hypertension: Secondary | ICD-10-CM | POA: Diagnosis not present

## 2019-08-10 DIAGNOSIS — D649 Anemia, unspecified: Secondary | ICD-10-CM | POA: Diagnosis not present

## 2019-08-10 DIAGNOSIS — K831 Obstruction of bile duct: Secondary | ICD-10-CM | POA: Diagnosis not present

## 2019-08-10 LAB — TYPE AND SCREEN
ABO/RH(D): A POS
Antibody Screen: NEGATIVE
Unit division: 0
Unit division: 0

## 2019-08-10 LAB — BPAM RBC
Blood Product Expiration Date: 202107202359
Blood Product Expiration Date: 202107212359
ISSUE DATE / TIME: 202107052249
ISSUE DATE / TIME: 202107060212
Unit Type and Rh: 6200
Unit Type and Rh: 6200

## 2019-08-10 LAB — HAPTOGLOBIN: Haptoglobin: 333 mg/dL (ref 32–363)

## 2019-08-10 LAB — HEPATIC FUNCTION PANEL
ALT: 36 U/L (ref 0–44)
AST: 38 U/L (ref 15–41)
Albumin: 2.6 g/dL — ABNORMAL LOW (ref 3.5–5.0)
Alkaline Phosphatase: 139 U/L — ABNORMAL HIGH (ref 38–126)
Bilirubin, Direct: 1 mg/dL — ABNORMAL HIGH (ref 0.0–0.2)
Indirect Bilirubin: 1.3 mg/dL — ABNORMAL HIGH (ref 0.3–0.9)
Total Bilirubin: 2.3 mg/dL — ABNORMAL HIGH (ref 0.3–1.2)
Total Protein: 6.7 g/dL (ref 6.5–8.1)

## 2019-08-10 MED ORDER — CIPROFLOXACIN HCL 500 MG PO TABS: 500.0000 mg | ORAL_TABLET | Freq: Two times a day (BID) | ORAL | 0 refills | Status: DC

## 2019-08-10 MED ORDER — GADOBUTROL 1 MMOL/ML IV SOLN
7.0000 mL | Freq: Once | INTRAVENOUS | Status: AC | PRN
  Administered 2019-08-10: 7 mL via INTRAVENOUS

## 2019-08-10 NOTE — Progress Notes (Signed)
Patient ID: Isaac Doyle, male   DOB: 03/26/1955, 64 y.o.   MRN: [id number]    Progress Note   Subjective   Day # 2  CC; fatigue and shortness of breath, pancreatic cancer, profound anemia  MRI/MRCP last p.m.-multiple dependent gallstones, mild gallbladder wall thickening severely degenerated images due to motion artifact poor definition of the common hepatic duct and common bile duct but no intrahepatic biliary dilation and therefore stent felt to be patent- abnormal infiltrative hypoenhancement in the pancreatic body, most of the pancreatic tail and extending into a significant portion of the pancreatic head  Labs this a.m.-T bili 2.3/direct bili 1.0/alk phos 139/AST 38/ALT 36  Patient has no complaints of abdominal pain, tolerating p.o.'s, no nausea or vomiting, no fever or chills   Objective   Vital signs in last 24 hours: Temp:  [97.6 F (36.4 C)-98.6 F (37 C)] 98.6 F (37 C) (07/07 0412) Pulse Rate:  [62-66] 66 (07/07 0412) Resp:  [16-19] 19 (07/07 0412) BP: (115-122)/(65-75) 122/65 (07/07 0412) SpO2:  [100 %] 100 % (07/07 0412) Last BM Date: 08/07/19 General:    AA male in NAD Heart:  Regular rate and rhythm; no murmurs Lungs: Respirations even and unlabored, lungs CTA bilaterally Abdomen:  Soft, nontender and nondistended. Normal bowel sounds. Extremities:  Without edema. Neurologic:  Alert and oriented,  grossly normal neurologically. Psych:  Cooperative. Normal mood and affect.  Intake/Output from previous day: 07/06 0701 - 07/07 0700 In: 480 [P.O.:480] Out: 1175 [Urine:1175] Intake/Output this shift: No intake/output data recorded.  Lab Results: Recent Labs    08/08/19 1544 08/09/19 0814  WBC 9.7 6.6  HGB 7.0* 8.9*  HCT 21.6* 27.1*  PLT 321 310   BMET Recent Labs    08/08/19 1544 08/09/19 0814  NA 135 135  K 3.3* 3.7  CL 100 101  CO2 25 24  GLUCOSE 111* 98  BUN 13 11  CREATININE 1.05 0.88  CALCIUM 8.9 8.8*   LFT Recent Labs     08/10/19 0414  PROT 6.7  ALBUMIN 2.6*  AST 38  ALT 36  ALKPHOS 139*  BILITOT 2.3*  BILIDIR 1.0*  IBILI 1.3*   PT/INR No results for input(s): LABPROT, INR in the last 72 hours.  Studies/Results: DG Chest 1 View  Result Date: 08/08/2019 CLINICAL DATA:  History of pancreatic cancer presenting with shortness of breath. EXAM: CHEST  1 VIEW COMPARISON:  None. FINDINGS: A right-sided venous Port-A-Cath is seen with its distal tip noted near the junction of the superior vena cava and right atrium. There is no evidence of acute infiltrate, pleural effusion or pneumothorax. The heart size and mediastinal contours are within normal limits. The visualized skeletal structures are unremarkable. IMPRESSION: No active disease. Electronically Signed   By: Virgina Norfolk M.D.   On: 08/08/2019 16:29   MR 3D Recon At Scanner  Result Date: 08/10/2019 CLINICAL DATA:  Pancreatic cancer.  Jaundice. EXAM: MRI ABDOMEN WITHOUT AND WITH CONTRAST (INCLUDING MRCP) TECHNIQUE: Multiplanar multisequence MR imaging of the abdomen was performed both before and after the administration of intravenous contrast. Heavily T2-weighted images of the biliary and pancreatic ducts were obtained, and three-dimensional MRCP images were rendered by post processing. CONTRAST:  29m GADAVIST GADOBUTROL 1 MMOL/ML IV SOLN COMPARISON:  Multiple exams, including MRCP from 05/10/2019 and CT from 05/13/2018 FINDINGS: Despite efforts by the technologist and patient, motion artifact is present on today's exam and could not be eliminated. This reduces exam sensitivity and specificity. Lower chest: Metal artifact  from right Port-A-Cath noted. Hepatobiliary: Multiple dependent gallstones in the gallbladder. Mild gallbladder wall thickening. Severely degenerated MRCP images due to motion artifact. Poor definition of the common hepatic duct and common bile duct, although the lack of intrahepatic biliary dilatation suggests that the stent is likely still in  place and functional. The biliary tree is obscured over an approximately 4.7 cm segment, where there is low associated T1 and T2 signal. In the early arterial phase 20 second images, there is some accentuation of enhancement along the gallbladder fossa which can sometimes correlate with gallbladder inflammation causing local hyperemia. Pancreas: There is abnormal infiltrative hypoenhancement in the pancreatic body, most of the pancreatic tail, and extending into a significant portion of the pancreatic head. This appearance in combination with the abnormal cytology on prior FNA is suspicious for an infiltrative pancreatic cancer involving most of the parenchyma of the pancreas, with sparing of the tip of the tail the pancreas and a small portion of the pancreatic head. It is conceivable that some of the appearance is due to superimposed pancreatitis exaggerating the degree of involvement. Spleen:  Unremarkable Adrenals/Urinary Tract: Bilateral renal cysts, left greater than right. Adrenal glands unremarkable. Stomach/Bowel: Unremarkable Vascular/Lymphatic:  Unremarkable Other: Trace ascites the perihepatic, perisplenic, and paracolic gutter regions. Musculoskeletal: Degenerative endplate findings in the lower lumbar spine. IMPRESSION: 1. Infiltrative hypoenhancement in the pancreatic body, most of the pancreatic tail, and extending into a significant portion of the pancreatic head. This appearance in combination with the abnormal cytology on prior FNA is suspicious for an infiltrative pancreatic cancer involving most of the parenchyma of the pancreas, with sparing of the tip of the pancreatic tail and a small portion of the pancreatic head. 2. The common hepatic duct and common bile duct is obscured by low signal over an approximately 4.7 cm segment, although the lack of intrahepatic biliary dilatation suggests that the stent is still in place and functional. 3. Cholelithiasis with mild gallbladder wall thickening.  There is some accentuation of enhancement along the gallbladder fossa which can sometimes correlate with gallbladder inflammation causing local hyperemia. 4. Trace ascites. Electronically Signed   By: Van Clines M.D.   On: 08/10/2019 07:24   MR ABDOMEN MRCP W WO CONTAST  Result Date: 08/10/2019 CLINICAL DATA:  Pancreatic cancer.  Jaundice. EXAM: MRI ABDOMEN WITHOUT AND WITH CONTRAST (INCLUDING MRCP) TECHNIQUE: Multiplanar multisequence MR imaging of the abdomen was performed both before and after the administration of intravenous contrast. Heavily T2-weighted images of the biliary and pancreatic ducts were obtained, and three-dimensional MRCP images were rendered by post processing. CONTRAST:  72m GADAVIST GADOBUTROL 1 MMOL/ML IV SOLN COMPARISON:  Multiple exams, including MRCP from 05/10/2019 and CT from 05/13/2018 FINDINGS: Despite efforts by the technologist and patient, motion artifact is present on today's exam and could not be eliminated. This reduces exam sensitivity and specificity. Lower chest: Metal artifact from right Port-A-Cath noted. Hepatobiliary: Multiple dependent gallstones in the gallbladder. Mild gallbladder wall thickening. Severely degenerated MRCP images due to motion artifact. Poor definition of the common hepatic duct and common bile duct, although the lack of intrahepatic biliary dilatation suggests that the stent is likely still in place and functional. The biliary tree is obscured over an approximately 4.7 cm segment, where there is low associated T1 and T2 signal. In the early arterial phase 20 second images, there is some accentuation of enhancement along the gallbladder fossa which can sometimes correlate with gallbladder inflammation causing local hyperemia. Pancreas: There is abnormal infiltrative hypoenhancement in  the pancreatic body, most of the pancreatic tail, and extending into a significant portion of the pancreatic head. This appearance in combination with the  abnormal cytology on prior FNA is suspicious for an infiltrative pancreatic cancer involving most of the parenchyma of the pancreas, with sparing of the tip of the tail the pancreas and a small portion of the pancreatic head. It is conceivable that some of the appearance is due to superimposed pancreatitis exaggerating the degree of involvement. Spleen:  Unremarkable Adrenals/Urinary Tract: Bilateral renal cysts, left greater than right. Adrenal glands unremarkable. Stomach/Bowel: Unremarkable Vascular/Lymphatic:  Unremarkable Other: Trace ascites the perihepatic, perisplenic, and paracolic gutter regions. Musculoskeletal: Degenerative endplate findings in the lower lumbar spine. IMPRESSION: 1. Infiltrative hypoenhancement in the pancreatic body, most of the pancreatic tail, and extending into a significant portion of the pancreatic head. This appearance in combination with the abnormal cytology on prior FNA is suspicious for an infiltrative pancreatic cancer involving most of the parenchyma of the pancreas, with sparing of the tip of the pancreatic tail and a small portion of the pancreatic head. 2. The common hepatic duct and common bile duct is obscured by low signal over an approximately 4.7 cm segment, although the lack of intrahepatic biliary dilatation suggests that the stent is still in place and functional. 3. Cholelithiasis with mild gallbladder wall thickening. There is some accentuation of enhancement along the gallbladder fossa which can sometimes correlate with gallbladder inflammation causing local hyperemia. 4. Trace ascites. Electronically Signed   By: Van Clines M.D.   On: 08/10/2019 07:24       Assessment / Plan:    #64 64 year old African-American male with pancreatic adenocarcinoma who presented with biliary obstruction at the time of diagnosis in April 2021.  He is status post covered stent placement on 06/03/2019.  He has been undergoing chemotherapy. Admitted with fatigue and  shortness of breath found to have profound anemia.  This is felt secondary to chemotherapy.  Significant improvement in symptoms post transfusions.  T bili has been noted to be elevated over the past couple of weeks though no persistent rise in bili.  MRI/MRCP has been reviewed by Dr. Lyndel Safe.  Biliary stent is patent.  He may have a component of low-grade cholangitis.  Do not feel that ERCP indicated at present. We will need to continue to follow LFTs as an outpatient per oncology.  He may require ERCP with further stenting in the next few months. Recommend continuing on Cipro 500 mg p.o. twice daily x2 weeks as an outpatient. GI will arrange for outpatient appointment in 3 to 4 weeks. GI will sign off, please call for problems.    Principal Problem:   Symptomatic anemia Active Problems:   Essential hypertension   Normochromic normocytic anemia   Malignant biliary obstruction (HCC)   Biliary stricture   Malignant neoplasm of pancreas (HCC)     LOS: 0 days   Galit Urich EsterwoodPA-C  08/10/2019, 8:57 AM

## 2019-08-10 NOTE — Progress Notes (Deleted)
[place]   [phone] [phone]   Clinic Follow up Note   Patient Care Team: System, Pcp Not In as PCP - General Truitt Merle, MD as Consulting Physician (Hematology) Jonnie Finner, RN as Oncology Nurse Navigator 08/10/2019  CHIEF COMPLAINT:   SUMMARY OF ONCOLOGIC HISTORY: Oncology History Overview Note  Cancer Staging Malignant neoplasm of pancreas [facility]) Staging form: Exocrine Pancreas, AJCC 8th Edition - Clinical stage from 05/19/2019: Stage III (cT4, cN1, cM0) - Signed by Truitt Merle, MD on 05/24/2019    Malignant neoplasm of pancreas ([place])  05/10/2019 Tumor Marker   Baseline  CEA at 31.1 Ca 19-9 at 2411   05/11/2019 Procedure   ERCP by Dr Henrene Pastor 05/11/19  IMPRESSION 1. Malignant appearing distal bile duct stricture with upstream dilation. Status post ERCP with sphincterotomy and biliary stent placement   05/13/2019 Imaging   CT Chest and Pancreas 05/13/19 IMPRESSION: 1. Interval placement of common bile duct stent with decompression of the bile ducts. Pneumobilia is now noted compatible with biliary patency. 2. Diffuse infiltrative process involving the head, neck, body and tail of pancreas is identified. The diffusely infiltrative appearance of the pancreas is somewhat unusual. Although favored to represent pancreatic adenocarcinoma, other etiologies to consider include IgG4-related Sclerosing Disease of the pancreas. 3. There is encasement and narrowing of the portal venous confluence and proximal portal vein. Mild soft tissue stranding extends to but does not encase the superior mesenteric artery. No convincing evidence for involvement of the celiac trunk. 4. Borderline enlarged portacaval node. No convincing evidence for liver metastasis or metastatic disease to the chest. 5. Aortic atherosclerosis. Aortic Atherosclerosis (ICD10-I70.0).   05/19/2019 Initial Diagnosis   Malignant neoplasm of pancreas ([place])   05/19/2019 Cancer  Staging   Staging form: Exocrine Pancreas, AJCC 8th Edition - Clinical stage from 05/19/2019: Stage III (cT4, cN1, cM0) - Signed by Truitt Merle, MD on 05/24/2019   05/19/2019 Procedure   EUS by Dr Ardis Hughs 05/19/19 - Large mass involving much of the pancreatic parenchyma, clear encasing the PV and possibly involving the SMA as well. The mass was sampled with 3 transduodenal EUS FNB passes and the preliminary cytology was positive for malignancy (adenocarcinoma). - Previously placed plastic biliary stent was in the CBD in good position.    05/19/2019 Initial Biopsy   A. 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   06/08/2019 Procedure   PAC placed    06/16/2019 -  Chemotherapy   FOLFIRINOX q2weeks starting 06/16/19     CURRENT THERAPY:   INTERVAL HISTORY:   REVIEW OF SYSTEMS:   Constitutional: Denies fevers, chills or abnormal weight loss Eyes: Denies blurriness of vision Ears, nose, mouth, throat, and face: Denies mucositis or sore throat Respiratory: Denies cough, dyspnea or wheezes Cardiovascular: Denies palpitation, chest discomfort or lower extremity swelling Gastrointestinal:  Denies nausea, heartburn or change in bowel habits Skin: Denies abnormal skin rashes Lymphatics: Denies new lymphadenopathy or easy bruising Neurological:Denies numbness, tingling or new weaknesses Behavioral/Psych: Mood is stable, no new changes  All other systems were reviewed with the patient and are negative.  MEDICAL HISTORY:  Past Medical History:  Diagnosis Date   Hypertension     SURGICAL HISTORY: Past Surgical History:  Procedure Laterality Date   BILIARY STENT PLACEMENT  05/11/2019  Procedure: BILIARY STENT PLACEMENT;  Surgeon: Irene Shipper, MD;  Location: [facility] ENDOSCOPY;  Service:  Endoscopy;;   BILIARY STENT PLACEMENT  06/03/2019   Procedure: BILIARY STENT PLACEMENT;  Surgeon: Jackquline Denmark, MD;  Location: [facility] ENDOSCOPY;  Service: Endoscopy;;   ERCP N/A 05/11/2019   Procedure: ENDOSCOPIC RETROGRADE CHOLANGIOPANCREATOGRAPHY (ERCP);  Surgeon: Irene Shipper, MD;  Location: [facility] ENDOSCOPY;  Service: Endoscopy;  Laterality: N/A;  with    ERCP N/A 06/03/2019   Procedure: ENDOSCOPIC RETROGRADE CHOLANGIOPANCREATOGRAPHY (ERCP);  Surgeon: Jackquline Denmark, MD;  Location: [facility] ENDOSCOPY;  Service: Endoscopy;  Laterality: N/A;   ESOPHAGOGASTRODUODENOSCOPY (EGD) WITH PROPOFOL N/A 05/19/2019   Procedure: ESOPHAGOGASTRODUODENOSCOPY (EGD) WITH PROPOFOL;  Surgeon: [name], MD;  Location: [facility] ENDOSCOPY;  Service: Endoscopy;  Laterality: N/A;   EUS N/A 05/19/2019   Procedure: UPPER ENDOSCOPIC ULTRASOUND (EUS) LINEAR;  Surgeon: [name], MD;  Location: [facility] ENDOSCOPY;  Service: Endoscopy;  Laterality: N/A;   FINE NEEDLE ASPIRATION N/A 05/19/2019   Procedure: FINE NEEDLE ASPIRATION (FNA) LINEAR;  Surgeon: [name], MD;  Location: [facility] ENDOSCOPY;  Service: Endoscopy;  Laterality: N/A;   IR IMAGING GUIDED PORT INSERTION  06/08/2019   SPHINCTEROTOMY  05/11/2019   Procedure: SPHINCTEROTOMY;  Surgeon: Irene Shipper, MD;  Location: [facility] ENDOSCOPY;  Service: Endoscopy;;   STENT REMOVAL  06/03/2019   Procedure: STENT REMOVAL;  Surgeon: Jackquline Denmark, MD;  Location: [facility] ENDOSCOPY;  Service: Endoscopy;;    I have reviewed the social history and family history with the patient and they are unchanged from previous note.  ALLERGIES:  has No Known Allergies.  MEDICATIONS:  No current facility-administered medications for this visit.   Current Outpatient Medications  Medication Sig Dispense Refill   feeding supplement, ENSURE ENLIVE, (ENSURE ENLIVE) LIQD Take 237 mLs by mouth 2 (two) times daily between meals. 14220 mL 0   pantoprazole (PROTONIX) 40 MG tablet Take 1 tablet (40 mg total) by  mouth daily. 30 tablet 0   Facility-Administered Medications Ordered in Other Visits  Medication Dose Route Frequency Provider Last Rate Last Admin   acetaminophen (TYLENOL) tablet 650 mg  650 mg Oral Q6H PRN Arrien, Jimmy Picket, MD       Or   acetaminophen (TYLENOL) suppository 650 mg  650 mg Rectal Q6H PRN Arrien, Jimmy Picket, MD       Chlorhexidine Gluconate Cloth 2 % PADS 6 each  6 each Topical Daily Truitt Merle, MD   6 each at 08/09/19 0915   ciprofloxacin (CIPRO) tablet 500 mg  500 mg Oral BID Tawni Millers, MD   500 mg at 08/09/19 2027   enoxaparin (LOVENOX) injection 40 mg  40 mg Subcutaneous Q24H Tawni Millers, MD   40 mg at 08/09/19 2100   feeding supplement (ENSURE ENLIVE) (ENSURE ENLIVE) liquid 237 mL  237 mL Oral BID BM Arrien, Jimmy Picket, MD   237 mL at 08/09/19 0916   heparin lock flush 100 unit/mL  500 Units Intracatheter Prior to discharge Arrien, Jimmy Picket, MD       ondansetron [facility]) tablet 4 mg  4 mg Oral Q6H PRN Arrien, Jimmy Picket, MD       Or   ondansetron [facility]) injection 4 mg  4 mg Intravenous Q6H PRN Arrien, Jimmy Picket, MD       pantoprazole (PROTONIX) EC tablet 40 mg  40 mg Oral Daily Tawni Millers, MD   40 mg at 08/09/19 3154   sodium chloride flush (NS) 0.9 % injection  10-40 mL  10-40 mL Intracatheter PRN Truitt Merle, MD        PHYSICAL EXAMINATION: ECOG PERFORMANCE STATUS: {CHL ONC ECOG QV:[id number]}  There were no vitals filed for this visit. There were no vitals filed for this visit.  GENERAL:alert, no distress and comfortable SKIN: skin color, texture, turgor are normal, no rashes or significant lesions EYES: normal, Conjunctiva are pink and non-injected, sclera clear OROPHARYNX:no exudate, no erythema and lips, buccal mucosa, and tongue normal  NECK: supple, thyroid normal size, non-tender, without nodularity LYMPH:  no palpable lymphadenopathy in the cervical, axillary or  inguinal LUNGS: clear to auscultation and percussion with normal breathing effort HEART: regular rate & rhythm and no murmurs and no lower extremity edema ABDOMEN:abdomen soft, non-tender and normal bowel sounds Musculoskeletal:no cyanosis of digits and no clubbing  NEURO: alert & oriented x 3 with fluent speech, no focal motor/sensory deficits  LABORATORY DATA:  I have reviewed the data as listed CBC Latest Ref Rng & Units 08/09/2019 08/08/2019 08/04/2019  WBC 4.0 - 10.5 K/uL 6.6 9.7 16.5(H)  Hemoglobin 13.0 - 17.0 g/dL 8.9(L) 7.0(L) 8.2(L)  Hematocrit 39 - 52 % 27.1(L) 21.6(L) 25.0(L)  Platelets 150 - 400 K/uL 310 321 311     CMP Latest Ref Rng & Units 08/10/2019 08/09/2019 08/08/2019  Glucose 70 - 99 mg/dL - 98 111(H)  BUN 8 - 23 mg/dL - 11 13  Creatinine 0.61 - 1.24 mg/dL - 0.88 1.05  Sodium 135 - 145 mmol/L - 135 135  Potassium 3.5 - 5.1 mmol/L - 3.7 3.3(L)  Chloride 98 - 111 mmol/L - 101 100  CO2 22 - 32 mmol/L - 24 25  Calcium 8.9 - 10.3 mg/dL - 8.8(L) 8.9  Total Protein 6.5 - 8.1 g/dL 6.7 6.6 6.8  Total Bilirubin 0.3 - 1.2 mg/dL 2.3(H) 3.7(H) 2.7(H)  Alkaline Phos 38 - 126 U/L 139(H) 143(H) 161(H)  AST 15 - 41 U/L 38 36 36  ALT 0 - 44 U/L 36 35 34      RADIOGRAPHIC STUDIES: I have personally reviewed the radiological images as listed and agreed with the findings in the report. DG Chest 1 View  Result Date: 08/08/2019 CLINICAL DATA:  History of pancreatic cancer presenting with shortness of breath. EXAM: CHEST  1 VIEW COMPARISON:  None. FINDINGS: A right-sided venous Port-A-Cath is seen with its distal tip noted near the junction of the superior vena cava and right atrium. There is no evidence of acute infiltrate, pleural effusion or pneumothorax. The heart size and mediastinal contours are within normal limits. The visualized skeletal structures are unremarkable. IMPRESSION: No active disease. Electronically Signed   By: Virgina Norfolk M.D.   On: 08/08/2019 16:29   MR 3D Recon  At Scanner  Result Date: 08/10/2019 CLINICAL DATA:  Pancreatic cancer.  Jaundice. EXAM: MRI ABDOMEN WITHOUT AND WITH CONTRAST (INCLUDING MRCP) TECHNIQUE: Multiplanar multisequence MR imaging of the abdomen was performed both before and after the administration of intravenous contrast. Heavily T2-weighted images of the biliary and pancreatic ducts were obtained, and three-dimensional MRCP images were rendered by post processing. CONTRAST:  80mL GADAVIST GADOBUTROL 1 MMOL/ML IV SOLN COMPARISON:  Multiple exams, including MRCP from 05/10/2019 and CT from 05/13/2018 FINDINGS: Despite efforts by the technologist and patient, motion artifact is present on today's exam and could not be eliminated. This reduces exam sensitivity and specificity. Lower chest: Metal artifact from right Port-A-Cath noted. Hepatobiliary: Multiple dependent gallstones in the gallbladder. Mild gallbladder wall thickening. Severely degenerated MRCP images due  to motion artifact. Poor definition of the common hepatic duct and common bile duct, although the lack of intrahepatic biliary dilatation suggests that the stent is likely still in place and functional. The biliary tree is obscured over an approximately 4.7 cm segment, where there is low associated T1 and T2 signal. In the early arterial phase 20 second images, there is some accentuation of enhancement along the gallbladder fossa which can sometimes correlate with gallbladder inflammation causing local hyperemia. Pancreas: There is abnormal infiltrative hypoenhancement in the pancreatic body, most of the pancreatic tail, and extending into a significant portion of the pancreatic head. This appearance in combination with the abnormal cytology on prior FNA is suspicious for an infiltrative pancreatic cancer involving most of the parenchyma of the pancreas, with sparing of the tip of the tail the pancreas and a small portion of the pancreatic head. It is conceivable that some of the appearance is  due to superimposed pancreatitis exaggerating the degree of involvement. Spleen:  Unremarkable Adrenals/Urinary Tract: Bilateral renal cysts, left greater than right. Adrenal glands unremarkable. Stomach/Bowel: Unremarkable Vascular/Lymphatic:  Unremarkable Other: Trace ascites the perihepatic, perisplenic, and paracolic gutter regions. Musculoskeletal: Degenerative endplate findings in the lower lumbar spine. IMPRESSION: 1. Infiltrative hypoenhancement in the pancreatic body, most of the pancreatic tail, and extending into a significant portion of the pancreatic head. This appearance in combination with the abnormal cytology on prior FNA is suspicious for an infiltrative pancreatic cancer involving most of the parenchyma of the pancreas, with sparing of the tip of the pancreatic tail and a small portion of the pancreatic head. 2. The common hepatic duct and common bile duct is obscured by low signal over an approximately 4.7 cm segment, although the lack of intrahepatic biliary dilatation suggests that the stent is still in place and functional. 3. Cholelithiasis with mild gallbladder wall thickening. There is some accentuation of enhancement along the gallbladder fossa which can sometimes correlate with gallbladder inflammation causing local hyperemia. 4. Trace ascites. Electronically Signed   By: Van Clines M.D.   On: 08/10/2019 07:24   MR ABDOMEN MRCP W WO CONTAST  Result Date: 08/10/2019 CLINICAL DATA:  Pancreatic cancer.  Jaundice. EXAM: MRI ABDOMEN WITHOUT AND WITH CONTRAST (INCLUDING MRCP) TECHNIQUE: Multiplanar multisequence MR imaging of the abdomen was performed both before and after the administration of intravenous contrast. Heavily T2-weighted images of the biliary and pancreatic ducts were obtained, and three-dimensional MRCP images were rendered by post processing. CONTRAST:  41mL GADAVIST GADOBUTROL 1 MMOL/ML IV SOLN COMPARISON:  Multiple exams, including MRCP from 05/10/2019 and CT from  05/13/2018 FINDINGS: Despite efforts by the technologist and patient, motion artifact is present on today's exam and could not be eliminated. This reduces exam sensitivity and specificity. Lower chest: Metal artifact from right Port-A-Cath noted. Hepatobiliary: Multiple dependent gallstones in the gallbladder. Mild gallbladder wall thickening. Severely degenerated MRCP images due to motion artifact. Poor definition of the common hepatic duct and common bile duct, although the lack of intrahepatic biliary dilatation suggests that the stent is likely still in place and functional. The biliary tree is obscured over an approximately 4.7 cm segment, where there is low associated T1 and T2 signal. In the early arterial phase 20 second images, there is some accentuation of enhancement along the gallbladder fossa which can sometimes correlate with gallbladder inflammation causing local hyperemia. Pancreas: There is abnormal infiltrative hypoenhancement in the pancreatic body, most of the pancreatic tail, and extending into a significant portion of the pancreatic head. This appearance  in combination with the abnormal cytology on prior FNA is suspicious for an infiltrative pancreatic cancer involving most of the parenchyma of the pancreas, with sparing of the tip of the tail the pancreas and a small portion of the pancreatic head. It is conceivable that some of the appearance is due to superimposed pancreatitis exaggerating the degree of involvement. Spleen:  Unremarkable Adrenals/Urinary Tract: Bilateral renal cysts, left greater than right. Adrenal glands unremarkable. Stomach/Bowel: Unremarkable Vascular/Lymphatic:  Unremarkable Other: Trace ascites the perihepatic, perisplenic, and paracolic gutter regions. Musculoskeletal: Degenerative endplate findings in the lower lumbar spine. IMPRESSION: 1. Infiltrative hypoenhancement in the pancreatic body, most of the pancreatic tail, and extending into a significant portion of  the pancreatic head. This appearance in combination with the abnormal cytology on prior FNA is suspicious for an infiltrative pancreatic cancer involving most of the parenchyma of the pancreas, with sparing of the tip of the pancreatic tail and a small portion of the pancreatic head. 2. The common hepatic duct and common bile duct is obscured by low signal over an approximately 4.7 cm segment, although the lack of intrahepatic biliary dilatation suggests that the stent is still in place and functional. 3. Cholelithiasis with mild gallbladder wall thickening. There is some accentuation of enhancement along the gallbladder fossa which can sometimes correlate with gallbladder inflammation causing local hyperemia. 4. Trace ascites. Electronically Signed   By: Van Clines M.D.   On: 08/10/2019 07:24     ASSESSMENT & PLAN:  No problem-specific Assessment & Plan notes found for this encounter.   No orders of the defined types were placed in this encounter.  All questions were answered. The patient knows to call the clinic with any problems, questions or concerns. No barriers to learning was detected. I spent {CHL ONC TIME VISIT - LTRVU:[id number]} counseling the patient face to face. The total time spent in the appointment was {CHL ONC TIME VISIT - HUOHF:[id number]} and more than 50% was on counseling and review of test results     Alla Feeling, NP 08/10/19

## 2019-08-10 NOTE — Discharge Summary (Signed)
Physician Discharge Summary  Isaac Doyle MLY:[id number] DOB: 15-Jan-1956 DOA: 08/08/2019  PCP: System, Pcp Not In  Admit date: 08/08/2019 Discharge date: 08/10/2019  Admitted From: home Disposition:  home  Recommendations for Outpatient Follow-up:  1. Follow up with Isaac Doyle as scheduled 2. Follow up with GI in 2-3 weeks 3. GI recommends 2 weeks of Cipro on [place]: none Equipment/Devices: none  Discharge Condition: stable CODE STATUS: Full code Diet recommendation: regular  HPI: Per admitting MD, Isaac Doyle is a 64 y.o. male with medical history significant of hypertension and pancreatic adenocarcinoma, head/neck/body stage III.  He is status post common bile duct stent May 11, 2019.  07/01 Patient was diagnosed with chemotherapy-induced anemia, his hemoglobin was 8.2 and also had hyperbilirubinemia, he underwent ultrasonographic follow-up and his stent was patent, but considering leukocytosis he was placed on ciprofloxacin 500 twice daily, due to possible cholangitis (07/02).  Plan to get an MRCP if no improvement in bilirubins. For the last 2 weeks patient complains of progressive, severe dyspnea on exertion and easy fatigability.  It is worse with exertion, no improving factors, no associated syncope episode but positive dizziness.  Denies any melena, hematochezia, hematemesis or hematuria. Patient has not had blood transfusion in the past.  Hospital Course / Discharge diagnoses: Symptomatic anemia, in the setting of pancreatic cancer and under chemotherapy-patient was admitted to the hospital, received 2 units of packed red blood cells and tolerated them well.  His repeat hemoglobin improved adequately, remained stable, and clinically has improved.  He will be discharged home in stable condition with close outpatient follow-up with oncology. Pancreatic adenocarcinoma with history of biliary obstruction status post stent placement-due to elevation of LFTs there was  concern about stent blockage/migration, GI was consulted patient underwent an MRCP and it appears that his biliary stent is patent.  Due to concern for a component of low-grade cholangitis they recommend ciprofloxacin for 2 weeks on discharge. Hypertension -resume home medications on discharge History of alcohol abuse-No signs of alcohol withdrawal.  Follow-up as an outpatient.   Discharge Instructions  Discharge Instructions    Diet - low sodium heart healthy   Complete by: As directed    Discharge instructions   Complete by: As directed    Please follow up with primary care in 7 days.   Increase activity slowly   Complete by: As directed      Allergies as of 08/10/2019   No Known Allergies     Medication List    STOP taking these medications   ondansetron 8 MG tablet Commonly known as: Zofran   prochlorperazine 10 MG tablet Commonly known as: COMPAZINE     TAKE these medications   Advil 200 MG tablet Generic drug: ibuprofen Take 200 mg by mouth every 6 (six) hours as needed for headache.   atenolol 100 MG tablet Commonly known as: TENORMIN Take 1 tablet (100 mg total) by mouth daily.   cetirizine 10 MG tablet Commonly known as: ZYRTEC Take 10 mg by mouth daily.   ciprofloxacin 500 MG tablet Commonly known as: Cipro Take 1 tablet (500 mg total) by mouth 2 (two) times daily. What changed: additional instructions   diphenoxylate-atropine 2.5-0.025 MG tablet Commonly known as: LOMOTIL Take 1-2 tablets by mouth 4 (four) times daily as needed for diarrhea or loose stools. What changed: how much to take   feeding supplement (ENSURE ENLIVE) Liqd Take 237 mLs by mouth 2 (two) times daily between meals.  guaifenesin 400 MG Tabs tablet Commonly known as: HUMIBID E Take 1 tablet (400 mg total) by mouth every 6 (six) hours as needed. What changed: when to take this   lidocaine-prilocaine cream Commonly known as: EMLA Apply 1 application topically as needed. What  changed: reasons to take this   loperamide 2 MG capsule Commonly known as: IMODIUM Take 1-2 capsules (2-4 mg total) by mouth every 6 (six) hours as needed for diarrhea or loose stools.   magnesium oxide 400 (241.3 Mg) MG tablet Commonly known as: MAG-OX Take 1 tablet (400 mg total) by mouth daily. What changed: when to take this   pantoprazole 40 MG tablet Commonly known as: PROTONIX Take 1 tablet (40 mg total) by mouth daily.   potassium chloride SA 20 MEQ tablet Commonly known as: KLOR-CON Take 2 tablets (40 mEq total) by mouth daily. What changed: Another medication with the same name was removed. Continue taking this medication, and follow the directions you see here.   sulindac 200 MG tablet Commonly known as: CLINORIL Take 400 mg by mouth 2 (two) times daily.       Follow-up Information    [facility].   Specialty: Home Health Services Why: agency will provide home health physical therapy and nurse. Contact information: [place] [address] [phone]               Consultations:  GI  Oncology   Procedures/Studies:  DG Chest 1 View  Result Date: 08/08/2019 CLINICAL DATA:  History of pancreatic cancer presenting with shortness of breath. EXAM: CHEST  1 VIEW COMPARISON:  None. FINDINGS: A right-sided venous Port-A-Cath is seen with its distal tip noted near the junction of the superior vena cava and right atrium. There is no evidence of acute infiltrate, pleural effusion or pneumothorax. The heart size and mediastinal contours are within normal limits. The visualized skeletal structures are unremarkable. IMPRESSION: No active disease. Electronically Signed   By: Virgina Norfolk M.D.   On: 08/08/2019 16:29   US Abdomen Complete  Result Date: 08/04/2019 CLINICAL DATA:  Pancreatic carcinoma with elevated liver enzymes. Biliary duct stent placement. EXAM: ABDOMEN ULTRASOUND COMPLETE COMPARISON:  CT abdomen and pelvis  May 13, 2019 FINDINGS: Gallbladder: Gallbladder contains sludge with intermingled 1-2 mm calculi. Gallbladder mildly distended. No gallbladder wall thickening or pericholecystic fluid. Common bile duct: Diameter: 6 mm. Stent present within the biliary duct. There is pneumobilia. Liver: No focal lesion identified. Within normal limits in parenchymal echogenicity. Portal vein is patent on color Doppler imaging with normal direction of blood flow towards the liver. IVC: No abnormality visualized. Pancreas: Visualized portion unremarkable. Note that most of pancreas obscured by gas. Spleen: Size and appearance within normal limits. Right Kidney: Length: 9.8 cm. Echogenicity is increased. No hydronephrosis visualized. 0.8 x 0.7 x 0.9 cm cyst arises from the upper pole the right kidney. Left Kidney: Length: 11.3 cm. Echogenicity is increased. No hydronephrosis visualized. There is a cyst arising from the upper pole of the left kidney measuring 2.7 x 1.7 x 2.0 cm. A 2.2 x 1.7 x 2.0 cm cyst is also noted arising from the mid left kidney. A subcentimeter cyst is also noted in the mid left kidney. Abdominal aorta: No aneurysm visualized. Other findings: No demonstrable ascites. IMPRESSION: 1. Gallbladder mildly distended with sludge and tiny gallstones. No gallbladder wall thickening or pericholecystic fluid. 2. Biliary stent present with pneumobilia, likely due to stent present. 3. Much of pancreas obscured by  gas. Visualized portions of pancreas appear grossly unremarkable. 4. Increased renal echogenicity, likely indicative of medical renal disease. No obstructing focus in either kidney. Cysts noted in each kidney. Electronically Signed   By: Lowella Grip III M.D.   On: 08/04/2019 12:58   MR 3D Recon At Scanner  Result Date: 08/10/2019 CLINICAL DATA:  Pancreatic cancer.  Jaundice. EXAM: MRI ABDOMEN WITHOUT AND WITH CONTRAST (INCLUDING MRCP) TECHNIQUE: Multiplanar multisequence MR imaging of the abdomen was  performed both before and after the administration of intravenous contrast. Heavily T2-weighted images of the biliary and pancreatic ducts were obtained, and three-dimensional MRCP images were rendered by post processing. CONTRAST:  74mL GADAVIST GADOBUTROL 1 MMOL/ML IV SOLN COMPARISON:  Multiple exams, including MRCP from 05/10/2019 and CT from 05/13/2018 FINDINGS: Despite efforts by the technologist and patient, motion artifact is present on today's exam and could not be eliminated. This reduces exam sensitivity and specificity. Lower chest: Metal artifact from right Port-A-Cath noted. Hepatobiliary: Multiple dependent gallstones in the gallbladder. Mild gallbladder wall thickening. Severely degenerated MRCP images due to motion artifact. Poor definition of the common hepatic duct and common bile duct, although the lack of intrahepatic biliary dilatation suggests that the stent is likely still in place and functional. The biliary tree is obscured over an approximately 4.7 cm segment, where there is low associated T1 and T2 signal. In the early arterial phase 20 second images, there is some accentuation of enhancement along the gallbladder fossa which can sometimes correlate with gallbladder inflammation causing local hyperemia. Pancreas: There is abnormal infiltrative hypoenhancement in the pancreatic body, most of the pancreatic tail, and extending into a significant portion of the pancreatic head. This appearance in combination with the abnormal cytology on prior FNA is suspicious for an infiltrative pancreatic cancer involving most of the parenchyma of the pancreas, with sparing of the tip of the tail the pancreas and a small portion of the pancreatic head. It is conceivable that some of the appearance is due to superimposed pancreatitis exaggerating the degree of involvement. Spleen:  Unremarkable Adrenals/Urinary Tract: Bilateral renal cysts, left greater than right. Adrenal glands unremarkable. Stomach/Bowel:  Unremarkable Vascular/Lymphatic:  Unremarkable Other: Trace ascites the perihepatic, perisplenic, and paracolic gutter regions. Musculoskeletal: Degenerative endplate findings in the lower lumbar spine. IMPRESSION: 1. Infiltrative hypoenhancement in the pancreatic body, most of the pancreatic tail, and extending into a significant portion of the pancreatic head. This appearance in combination with the abnormal cytology on prior FNA is suspicious for an infiltrative pancreatic cancer involving most of the parenchyma of the pancreas, with sparing of the tip of the pancreatic tail and a small portion of the pancreatic head. 2. The common hepatic duct and common bile duct is obscured by low signal over an approximately 4.7 cm segment, although the lack of intrahepatic biliary dilatation suggests that the stent is still in place and functional. 3. Cholelithiasis with mild gallbladder wall thickening. There is some accentuation of enhancement along the gallbladder fossa which can sometimes correlate with gallbladder inflammation causing local hyperemia. 4. Trace ascites. Electronically Signed   By: Van Clines M.D.   On: 08/10/2019 07:24   MR ABDOMEN MRCP W WO CONTAST  Result Date: 08/10/2019 CLINICAL DATA:  Pancreatic cancer.  Jaundice. EXAM: MRI ABDOMEN WITHOUT AND WITH CONTRAST (INCLUDING MRCP) TECHNIQUE: Multiplanar multisequence MR imaging of the abdomen was performed both before and after the administration of intravenous contrast. Heavily T2-weighted images of the biliary and pancreatic ducts were obtained, and three-dimensional MRCP images were rendered  by post processing. CONTRAST:  21mL GADAVIST GADOBUTROL 1 MMOL/ML IV SOLN COMPARISON:  Multiple exams, including MRCP from 05/10/2019 and CT from 05/13/2018 FINDINGS: Despite efforts by the technologist and patient, motion artifact is present on today's exam and could not be eliminated. This reduces exam sensitivity and specificity. Lower chest: Metal  artifact from right Port-A-Cath noted. Hepatobiliary: Multiple dependent gallstones in the gallbladder. Mild gallbladder wall thickening. Severely degenerated MRCP images due to motion artifact. Poor definition of the common hepatic duct and common bile duct, although the lack of intrahepatic biliary dilatation suggests that the stent is likely still in place and functional. The biliary tree is obscured over an approximately 4.7 cm segment, where there is low associated T1 and T2 signal. In the early arterial phase 20 second images, there is some accentuation of enhancement along the gallbladder fossa which can sometimes correlate with gallbladder inflammation causing local hyperemia. Pancreas: There is abnormal infiltrative hypoenhancement in the pancreatic body, most of the pancreatic tail, and extending into a significant portion of the pancreatic head. This appearance in combination with the abnormal cytology on prior FNA is suspicious for an infiltrative pancreatic cancer involving most of the parenchyma of the pancreas, with sparing of the tip of the tail the pancreas and a small portion of the pancreatic head. It is conceivable that some of the appearance is due to superimposed pancreatitis exaggerating the degree of involvement. Spleen:  Unremarkable Adrenals/Urinary Tract: Bilateral renal cysts, left greater than right. Adrenal glands unremarkable. Stomach/Bowel: Unremarkable Vascular/Lymphatic:  Unremarkable Other: Trace ascites the perihepatic, perisplenic, and paracolic gutter regions. Musculoskeletal: Degenerative endplate findings in the lower lumbar spine. IMPRESSION: 1. Infiltrative hypoenhancement in the pancreatic body, most of the pancreatic tail, and extending into a significant portion of the pancreatic head. This appearance in combination with the abnormal cytology on prior FNA is suspicious for an infiltrative pancreatic cancer involving most of the parenchyma of the pancreas, with sparing of  the tip of the pancreatic tail and a small portion of the pancreatic head. 2. The common hepatic duct and common bile duct is obscured by low signal over an approximately 4.7 cm segment, although the lack of intrahepatic biliary dilatation suggests that the stent is still in place and functional. 3. Cholelithiasis with mild gallbladder wall thickening. There is some accentuation of enhancement along the gallbladder fossa which can sometimes correlate with gallbladder inflammation causing local hyperemia. 4. Trace ascites. Electronically Signed   By: Van Clines M.D.   On: 08/10/2019 07:24      Subjective: - no chest pain, shortness of breath, no abdominal pain, nausea or vomiting.   Discharge Exam: BP 122/65 (BP Location: Left Arm)   Pulse 66   Temp 98.6 F (37 C)   Resp 19   Ht 5\' 11"  (1.803 m)   Wt 73.5 kg   SpO2 100%   BMI 22.59 kg/m   General: Pt is alert, awake, not in acute distress Cardiovascular: RRR, S1/S2 +, no rubs, no gallops Respiratory: CTA bilaterally, no wheezing, no rhonchi Abdominal: Soft, NT, ND, bowel sounds + Extremities: no edema, no cyanosis    The results of significant diagnostics from this hospitalization (including imaging, microbiology, ancillary and laboratory) are listed below for reference.     Microbiology: Recent Results (from the past 240 hour(s))  SARS Coronavirus 2 by RT PCR (hospital order, performed in [facility] hospital lab) Nasopharyngeal Nasopharyngeal Swab     Status: None   Collection Time: 08/08/19  4:26 PM  Specimen: Nasopharyngeal Swab  Result Value Ref Range Status   SARS Coronavirus 2 NEGATIVE NEGATIVE Final    Comment: (NOTE) SARS-CoV-2 target nucleic acids are NOT DETECTED.  The SARS-CoV-2 RNA is generally detectable in upper and lower respiratory specimens during the acute phase of infection. The lowest concentration of SARS-CoV-2 viral copies this assay can detect is 250 copies / mL. A negative result does not  preclude SARS-CoV-2 infection and should not be used as the sole basis for treatment or other patient management decisions.  A negative result may occur with improper specimen collection / handling, submission of specimen other than nasopharyngeal swab, presence of viral mutation(s) within the areas targeted by this assay, and inadequate number of viral copies (<250 copies / mL). A negative result must be combined with clinical observations, patient history, and epidemiological information.  Fact Sheet for Patients:   StrictlyIdeas.no  Fact Sheet for Healthcare Providers: BankingDealers.co.za  This test is not yet approved or  cleared by the [place] FDA and has been authorized for detection and/or diagnosis of SARS-CoV-2 by FDA under an Emergency Use Authorization (EUA).  This EUA will remain in effect (meaning this test can be used) for the duration of the COVID-19 declaration under Section 564(b)(1) of the Act, 21 U.S.C. section 360bbb-3(b)(1), unless the authorization is terminated or revoked sooner.  Performed at [facility], [place] [address], [place], [place] [address]      Labs: Basic Metabolic Panel: Recent Labs  Lab 08/04/19 0757 08/08/19 1544 08/09/19 0814  NA 134* 135 135  K 3.6 3.3* 3.7  CL 97* 100 101  CO2 22 25 24   GLUCOSE 119* 111* 98  BUN 13 13 11   CREATININE 1.29* 1.05 0.88  CALCIUM 9.4 8.9 8.8*   Liver Function Tests: Recent Labs  Lab 08/04/19 0757 08/08/19 1544 08/09/19 0814 08/10/19 0414  AST 44* 36 36 38  ALT 43 34 35 36  ALKPHOS 210* 161* 143* 139*  BILITOT 5.1* 2.7* 3.7* 2.3*  PROT 7.2 6.8 6.6 6.7  ALBUMIN 2.7* 2.9* 2.6* 2.6*   CBC: Recent Labs  Lab 08/04/19 0757 08/08/19 1544 08/09/19 0814  WBC 16.5* 9.7 6.6  NEUTROABS 12.9* 6.5  --   HGB 8.2* 7.0* 8.9*  HCT 25.0* 21.6* 27.1*  MCV 88.7 90.4 87.4  PLT 311 321 310   CBG: No results for input(s): GLUCAP in  the last 168 hours. Hgb A1c No results for input(s): HGBA1C in the last 72 hours. Lipid Profile No results for input(s): CHOL, HDL, LDLCALC, TRIG, CHOLHDL, LDLDIRECT in the last 72 hours. Thyroid function studies No results for input(s): TSH, T4TOTAL, T3FREE, THYROIDAB in the last 72 hours.  Invalid input(s): FREET3 Urinalysis    Component Value Date/Time   COLORURINE AMBER (A) 08/08/2019 2230   APPEARANCEUR CLEAR 08/08/2019 2230   LABSPEC 1.013 08/08/2019 2230   PHURINE 6.0 08/08/2019 2230   GLUCOSEU NEGATIVE 08/08/2019 2230   HGBUR NEGATIVE 08/08/2019 2230   BILIRUBINUR NEGATIVE 08/08/2019 2230   KETONESUR NEGATIVE 08/08/2019 2230   PROTEINUR NEGATIVE 08/08/2019 2230   NITRITE NEGATIVE 08/08/2019 2230   LEUKOCYTESUR NEGATIVE 08/08/2019 2230    FURTHER DISCHARGE INSTRUCTIONS:   Get Medicines reviewed and adjusted: Please take all your medications with you for your next visit with your Primary MD   Laboratory/radiological data: Please request your Primary MD to go over all hospital tests and procedure/radiological results at the follow up, please ask your Primary MD to get all Hospital records sent to his/her  office.   In some cases, they will be blood work, cultures and biopsy results pending at the time of your discharge. Please request that your primary care M.D. goes through all the records of your hospital data and follows up on these results.   Also Note the following: If you experience worsening of your admission symptoms, develop shortness of breath, life threatening emergency, suicidal or homicidal thoughts you must seek medical attention immediately by calling 911 or calling your MD immediately  if symptoms less severe.   You must read complete instructions/literature along with all the possible adverse reactions/side effects for all the Medicines you take and that have been prescribed to you. Take any new Medicines after you have completely understood and accpet all  the possible adverse reactions/side effects.    Do not drive when taking Pain medications or sleeping medications (Benzodaizepines)   Do not take more than prescribed Pain, Sleep and Anxiety Medications. It is not advisable to combine anxiety,sleep and pain medications without talking with your primary care practitioner   Special Instructions: If you have smoked or chewed Tobacco  in the last 2 yrs please stop smoking, stop any regular Alcohol  and or any Recreational drug use.   Wear Seat belts while driving.   Please note: You were cared for by a hospitalist during your hospital stay. Once you are discharged, your primary care physician will handle any further medical issues. Please note that NO REFILLS for any discharge medications will be authorized once you are discharged, as it is imperative that you return to your primary care physician (or establish a relationship with a primary care physician if you do not have one) for your post hospital discharge needs so that they can reassess your need for medications and monitor your lab values.  Time coordinating discharge: 40 minutes  SIGNED:  Marzetta Board, MD, PhD 08/10/2019, 12:33 PM

## 2019-08-10 NOTE — Progress Notes (Signed)
Went over Discharge packet with patient. No further questions or complaints from patient.

## 2019-08-12 ENCOUNTER — Inpatient Hospital Stay: Payer: No Typology Code available for payment source

## 2019-08-12 ENCOUNTER — Telehealth: Payer: Self-pay | Admitting: Hematology

## 2019-08-12 NOTE — Telephone Encounter (Signed)
Scheduled per RN Santiago Glad on 7/14. Printed updated calendar for pt.
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
## 2019-08-13 ENCOUNTER — Ambulatory Visit: Payer: Medicare Other

## 2019-08-15 ENCOUNTER — Telehealth: Payer: Self-pay

## 2019-08-15 NOTE — Telephone Encounter (Signed)
Mr Isaac Doyle daughter, Isaac Doyle left vm stating [name] has been complaining of mild chest pain.  I returned her call.  His pain is just below the nipple line he states it is mild pain, he is not experiencing increased dyspnea or diaphoreses during this pain.  I instruct Isaac Doyle to call 911 if he has chest pain, sweating, and shortness of breath.  She verbalized understanding.  She states she will be accompanying him to his appt this week.

## 2019-08-17 ENCOUNTER — Other Ambulatory Visit: Payer: Medicare Other

## 2019-08-17 ENCOUNTER — Encounter: Payer: Self-pay | Admitting: Hematology

## 2019-08-17 ENCOUNTER — Other Ambulatory Visit: Payer: Self-pay

## 2019-08-17 ENCOUNTER — Inpatient Hospital Stay (HOSPITAL_BASED_OUTPATIENT_CLINIC_OR_DEPARTMENT_OTHER): Payer: No Typology Code available for payment source | Admitting: Hematology

## 2019-08-17 ENCOUNTER — Ambulatory Visit: Payer: Medicare Other | Admitting: Nurse Practitioner

## 2019-08-17 ENCOUNTER — Ambulatory Visit: Payer: Medicare Other

## 2019-08-17 ENCOUNTER — Inpatient Hospital Stay: Payer: No Typology Code available for payment source

## 2019-08-17 VITALS — BP 139/69 | HR 61 | Temp 97.5°F | Resp 19 | Ht 71.0 in | Wt 161.9 lb

## 2019-08-17 DIAGNOSIS — K831 Obstruction of bile duct: Secondary | ICD-10-CM | POA: Diagnosis not present

## 2019-08-17 DIAGNOSIS — C250 Malignant neoplasm of head of pancreas: Secondary | ICD-10-CM | POA: Diagnosis not present

## 2019-08-17 DIAGNOSIS — Z5111 Encounter for antineoplastic chemotherapy: Secondary | ICD-10-CM | POA: Diagnosis not present

## 2019-08-17 DIAGNOSIS — Z95828 Presence of other vascular implants and grafts: Secondary | ICD-10-CM | POA: Insufficient documentation

## 2019-08-17 LAB — CMP (CANCER CENTER ONLY)
ALT: 27 U/L (ref 0–44)
AST: 27 U/L (ref 15–41)
Albumin: 2.9 g/dL — ABNORMAL LOW (ref 3.5–5.0)
Alkaline Phosphatase: 110 U/L (ref 38–126)
Anion gap: 9 (ref 5–15)
BUN: 11 mg/dL (ref 8–23)
CO2: 24 mmol/L (ref 22–32)
Calcium: 9.5 mg/dL (ref 8.9–10.3)
Chloride: 101 mmol/L (ref 98–111)
Creatinine: 0.99 mg/dL (ref 0.61–1.24)
GFR, Est AFR Am: >60 mL/min (ref >60)
GFR, Estimated: >60 mL/min (ref >60)
Glucose, Bld: 105 mg/dL — ABNORMAL HIGH (ref 70–99)
Potassium: 3.8 mmol/L (ref 3.5–5.1)
Sodium: 134 mmol/L — ABNORMAL LOW (ref 135–145)
Total Bilirubin: 1.7 mg/dL — ABNORMAL HIGH (ref 0.3–1.2)
Total Protein: 7.2 g/dL (ref 6.5–8.1)

## 2019-08-17 LAB — CBC WITH DIFFERENTIAL (CANCER CENTER ONLY)
Abs Immature Granulocytes: 0.01 10*3/uL (ref 0.00–0.07)
Basophils Absolute: 0.1 10*3/uL (ref 0.0–0.1)
Basophils Relative: 1 %
Eosinophils Absolute: 0.1 10*3/uL (ref 0.0–0.5)
Eosinophils Relative: 1 %
HCT: 28.8 % — ABNORMAL LOW (ref 39.0–52.0)
Hemoglobin: 9.2 g/dL — ABNORMAL LOW (ref 13.0–17.0)
Immature Granulocytes: 0 %
Lymphocytes Relative: 21 %
Lymphs Abs: 1.7 10*3/uL (ref 0.7–4.0)
MCH: 28 pg (ref 26.0–34.0)
MCHC: 31.9 g/dL (ref 30.0–36.0)
MCV: 87.8 fL (ref 80.0–100.0)
Monocytes Absolute: 0.8 10*3/uL (ref 0.1–1.0)
Monocytes Relative: 10 %
Neutro Abs: 5.5 10*3/uL (ref 1.7–7.7)
Neutrophils Relative %: 67 %
Platelet Count: 271 10*3/uL (ref 150–400)
RBC: 3.28 MIL/uL — ABNORMAL LOW (ref 4.22–5.81)
RDW: 14.7 % (ref 11.5–15.5)
WBC Count: 8.2 10*3/uL (ref 4.0–10.5)
nRBC: 0 % (ref 0.0–0.2)

## 2019-08-17 MED ORDER — SODIUM CHLORIDE 0.9% FLUSH
10.0000 mL | Freq: Once | INTRAVENOUS | Status: AC
  Administered 2019-08-17: 10 mL
  Filled 2019-08-17: qty 10

## 2019-08-17 MED ORDER — HEPARIN SOD (PORK) LOCK FLUSH 100 UNIT/ML IV SOLN
500.0000 [IU] | Freq: Once | INTRAVENOUS | Status: AC
  Administered 2019-08-17: 500 [IU]
  Filled 2019-08-17: qty 5

## 2019-08-18 ENCOUNTER — Telehealth: Payer: Self-pay | Admitting: Hematology

## 2019-08-18 NOTE — Telephone Encounter (Signed)
Scheduled per 7/14 los. Spoke with daughter and is aware of appt times and dates.

## 2019-08-22 ENCOUNTER — Inpatient Hospital Stay: Payer: No Typology Code available for payment source

## 2019-08-22 ENCOUNTER — Other Ambulatory Visit: Payer: Self-pay

## 2019-08-22 VITALS — BP 126/70 | HR 62 | Temp 98.0°F | Resp 16

## 2019-08-22 DIAGNOSIS — C250 Malignant neoplasm of head of pancreas: Secondary | ICD-10-CM

## 2019-08-22 DIAGNOSIS — Z5111 Encounter for antineoplastic chemotherapy: Secondary | ICD-10-CM | POA: Diagnosis not present

## 2019-08-22 DIAGNOSIS — Z95828 Presence of other vascular implants and grafts: Secondary | ICD-10-CM

## 2019-08-22 LAB — CBC WITH DIFFERENTIAL (CANCER CENTER ONLY)
Abs Immature Granulocytes: 0.03 10*3/uL (ref 0.00–0.07)
Basophils Absolute: 0 10*3/uL (ref 0.0–0.1)
Basophils Relative: 0 %
Eosinophils Absolute: 0.1 10*3/uL (ref 0.0–0.5)
Eosinophils Relative: 1 %
HCT: 28.4 % — ABNORMAL LOW (ref 39.0–52.0)
Hemoglobin: 9.1 g/dL — ABNORMAL LOW (ref 13.0–17.0)
Immature Granulocytes: 0 %
Lymphocytes Relative: 17 %
Lymphs Abs: 1.6 10*3/uL (ref 0.7–4.0)
MCH: 27.2 pg (ref 26.0–34.0)
MCHC: 32 g/dL (ref 30.0–36.0)
MCV: 84.8 fL (ref 80.0–100.0)
Monocytes Absolute: 0.9 10*3/uL (ref 0.1–1.0)
Monocytes Relative: 10 %
Neutro Abs: 6.4 10*3/uL (ref 1.7–7.7)
Neutrophils Relative %: 72 %
Platelet Count: 208 10*3/uL (ref 150–400)
RBC: 3.35 MIL/uL — ABNORMAL LOW (ref 4.22–5.81)
RDW: 15.1 % (ref 11.5–15.5)
WBC Count: 9 10*3/uL (ref 4.0–10.5)
nRBC: 0 % (ref 0.0–0.2)

## 2019-08-22 LAB — SAMPLE TO BLOOD BANK

## 2019-08-22 LAB — CMP (CANCER CENTER ONLY)
ALT: 16 U/L (ref 0–44)
AST: 17 U/L (ref 15–41)
Albumin: 2.9 g/dL — ABNORMAL LOW (ref 3.5–5.0)
Alkaline Phosphatase: 101 U/L (ref 38–126)
Anion gap: 11 (ref 5–15)
BUN: 11 mg/dL (ref 8–23)
CO2: 23 mmol/L (ref 22–32)
Calcium: 9.6 mg/dL (ref 8.9–10.3)
Chloride: 100 mmol/L (ref 98–111)
Creatinine: 0.98 mg/dL (ref 0.61–1.24)
GFR, Est AFR Am: >60 mL/min (ref >60)
GFR, Estimated: >60 mL/min (ref >60)
Glucose, Bld: 108 mg/dL — ABNORMAL HIGH (ref 70–99)
Potassium: 3.8 mmol/L (ref 3.5–5.1)
Sodium: 134 mmol/L — ABNORMAL LOW (ref 135–145)
Total Bilirubin: 1.8 mg/dL — ABNORMAL HIGH (ref 0.3–1.2)
Total Protein: 7.3 g/dL (ref 6.5–8.1)

## 2019-08-22 MED ORDER — ATROPINE SULFATE 1 MG/ML IJ SOLN: 0.5000 mg | Freq: Once | INTRAMUSCULAR | Status: DC | PRN

## 2019-08-22 MED ORDER — SODIUM CHLORIDE 0.9 % IV SOLN
2000.0000 mg/m2 | INTRAVENOUS | Status: DC
  Administered 2019-08-22: 4150 mg via INTRAVENOUS
  Filled 2019-08-22: qty 83

## 2019-08-22 MED ORDER — DEXTROSE 5 % IV SOLN
Freq: Once | INTRAVENOUS | Status: AC
  Filled 2019-08-22: qty 250

## 2019-08-22 MED ORDER — SODIUM CHLORIDE 0.9 % IV SOLN
400.0000 mg/m2 | Freq: Once | INTRAVENOUS | Status: AC
  Administered 2019-08-22: 828 mg via INTRAVENOUS
  Filled 2019-08-22: qty 41.4

## 2019-08-22 MED ORDER — PALONOSETRON HCL INJECTION 0.25 MG/5ML
0.2500 mg | Freq: Once | INTRAVENOUS | Status: AC
  Administered 2019-08-22: 0.25 mg via INTRAVENOUS

## 2019-08-22 MED ORDER — SODIUM CHLORIDE 0.9 % IV SOLN
150.0000 mg | Freq: Once | INTRAVENOUS | Status: AC
  Administered 2019-08-22: 150 mg via INTRAVENOUS
  Filled 2019-08-22: qty 150

## 2019-08-22 MED ORDER — PALONOSETRON HCL INJECTION 0.25 MG/5ML
INTRAVENOUS | Status: AC
  Filled 2019-08-22: qty 5

## 2019-08-22 MED ORDER — SODIUM CHLORIDE 0.9% FLUSH
10.0000 mL | Freq: Once | INTRAVENOUS | Status: AC
  Administered 2019-08-22: 10 mL
  Filled 2019-08-22: qty 10

## 2019-08-22 MED ORDER — OXALIPLATIN CHEMO INJECTION 100 MG/20ML
72.0000 mg/m2 | Freq: Once | INTRAVENOUS | Status: AC
  Administered 2019-08-22: 150 mg via INTRAVENOUS
  Filled 2019-08-22: qty 30

## 2019-08-22 MED ORDER — SODIUM CHLORIDE 0.9 % IV SOLN
10.0000 mg | Freq: Once | INTRAVENOUS | Status: AC
  Administered 2019-08-22: 10 mg via INTRAVENOUS
  Filled 2019-08-22: qty 10

## 2019-08-22 MED ORDER — ATROPINE SULFATE 0.4 MG/ML IJ SOLN
0.4000 mg | Freq: Once | INTRAMUSCULAR | Status: AC | PRN
  Administered 2019-08-22: 0.4 mg via INTRAVENOUS

## 2019-08-22 MED ORDER — SODIUM CHLORIDE 0.9 % IV SOLN
130.0000 mg/m2 | Freq: Once | INTRAVENOUS | Status: AC
  Administered 2019-08-22: 260 mg via INTRAVENOUS
  Filled 2019-08-22: qty 13

## 2019-08-22 MED ORDER — ATROPINE SULFATE 0.4 MG/ML IJ SOLN
INTRAMUSCULAR | Status: AC
  Filled 2019-08-22: qty 1

## 2019-08-22 NOTE — Patient Instructions (Signed)
[facility] Discharge Instructions for Patients Receiving Chemotherapy  Today you received the following chemotherapy agents Oxaliplatin (ELOXATIN), Leucovorin, Irinotecan (CAMPTOSAR) & Fluorouracil (ADRUCIL).  To help prevent nausea and vomiting after your treatment, we encourage you to take your nausea medication as prescribed.   If you develop nausea and vomiting that is not controlled by your nausea medication, call the clinic.   BELOW ARE SYMPTOMS THAT SHOULD BE REPORTED IMMEDIATELY:  *FEVER GREATER THAN 100.5 F  *CHILLS WITH OR WITHOUT FEVER  NAUSEA AND VOMITING THAT IS NOT CONTROLLED WITH YOUR NAUSEA MEDICATION  *UNUSUAL SHORTNESS OF BREATH  *UNUSUAL BRUISING OR BLEEDING  TENDERNESS IN MOUTH AND THROAT WITH OR WITHOUT PRESENCE OF ULCERS  *URINARY PROBLEMS  *BOWEL PROBLEMS  UNUSUAL RASH Items with * indicate a potential emergency and should be followed up as soon as possible.  Feel free to call the clinic should you have any questions or concerns. The clinic phone number is [phone].  Please show the [place] at check-in to the Emergency Department and triage nurse.

## 2019-08-24 ENCOUNTER — Inpatient Hospital Stay: Payer: No Typology Code available for payment source

## 2019-08-24 ENCOUNTER — Other Ambulatory Visit: Payer: Self-pay

## 2019-08-24 VITALS — BP 139/94 | HR 57 | Temp 97.8°F | Resp 18

## 2019-08-24 DIAGNOSIS — Z5111 Encounter for antineoplastic chemotherapy: Secondary | ICD-10-CM | POA: Diagnosis not present

## 2019-08-24 DIAGNOSIS — C250 Malignant neoplasm of head of pancreas: Secondary | ICD-10-CM

## 2019-08-24 MED ORDER — PEGFILGRASTIM-CBQV 6 MG/0.6ML ~~LOC~~ SOSY
6.0000 mg | PREFILLED_SYRINGE | Freq: Once | SUBCUTANEOUS | Status: AC
  Administered 2019-08-24: 6 mg via SUBCUTANEOUS

## 2019-08-24 MED ORDER — PEGFILGRASTIM-CBQV 6 MG/0.6ML ~~LOC~~ SOSY
PREFILLED_SYRINGE | SUBCUTANEOUS | Status: AC
  Filled 2019-08-24: qty 0.6

## 2019-08-24 MED ORDER — HEPARIN SOD (PORK) LOCK FLUSH 100 UNIT/ML IV SOLN
500.0000 [IU] | Freq: Once | INTRAVENOUS | Status: AC | PRN
  Administered 2019-08-24: 500 [IU]
  Filled 2019-08-24: qty 5

## 2019-08-24 MED ORDER — SODIUM CHLORIDE 0.9% FLUSH
10.0000 mL | INTRAVENOUS | Status: DC | PRN
  Administered 2019-08-24: 10 mL
  Filled 2019-08-24: qty 10

## 2019-08-25 ENCOUNTER — Telehealth: Payer: Self-pay | Admitting: Physician Assistant

## 2019-08-25 NOTE — Telephone Encounter (Signed)
Kim Nurse navigator from the [place] is wanting to inform us that the pt's appt for 8/3 is not authorized by the [place]. Caller states [facility] has to put in that request since they are the ones who referred the pt to [place]

## 2019-08-30 ENCOUNTER — Telehealth: Payer: Self-pay

## 2019-08-30 DIAGNOSIS — E876 Hypokalemia: Secondary | ICD-10-CM

## 2019-08-30 MED ORDER — POTASSIUM CHLORIDE CRYS ER 20 MEQ PO TBCR: 40.0000 meq | EXTENDED_RELEASE_TABLET | Freq: Every day | ORAL | 1 refills | Status: DC

## 2019-08-30 NOTE — Telephone Encounter (Signed)
Patient's daughter calls requesting refill on his Potassium be sent into his pharmacy on file.  I have sent in this refill.

## 2019-09-01 ENCOUNTER — Encounter: Payer: Medicare Other | Admitting: Nutrition

## 2019-09-01 ENCOUNTER — Ambulatory Visit: Payer: Medicare Other

## 2019-09-01 ENCOUNTER — Other Ambulatory Visit: Payer: Medicare Other

## 2019-09-01 ENCOUNTER — Ambulatory Visit: Payer: Medicare Other | Admitting: Hematology

## 2019-09-05 ENCOUNTER — Inpatient Hospital Stay: Payer: No Typology Code available for payment source | Attending: Hematology | Admitting: Nurse Practitioner

## 2019-09-05 ENCOUNTER — Encounter: Payer: Self-pay | Admitting: Nurse Practitioner

## 2019-09-05 ENCOUNTER — Other Ambulatory Visit: Payer: Self-pay

## 2019-09-05 ENCOUNTER — Inpatient Hospital Stay: Payer: No Typology Code available for payment source

## 2019-09-05 VITALS — BP 110/66 | HR 65 | Temp 97.9°F | Resp 18 | Ht 71.0 in | Wt 158.8 lb

## 2019-09-05 DIAGNOSIS — Z95828 Presence of other vascular implants and grafts: Secondary | ICD-10-CM

## 2019-09-05 DIAGNOSIS — C258 Malignant neoplasm of overlapping sites of pancreas: Secondary | ICD-10-CM | POA: Diagnosis present

## 2019-09-05 DIAGNOSIS — C250 Malignant neoplasm of head of pancreas: Secondary | ICD-10-CM

## 2019-09-05 DIAGNOSIS — Z452 Encounter for adjustment and management of vascular access device: Secondary | ICD-10-CM | POA: Insufficient documentation

## 2019-09-05 DIAGNOSIS — D649 Anemia, unspecified: Secondary | ICD-10-CM | POA: Insufficient documentation

## 2019-09-05 DIAGNOSIS — R197 Diarrhea, unspecified: Secondary | ICD-10-CM | POA: Diagnosis not present

## 2019-09-05 DIAGNOSIS — Z5111 Encounter for antineoplastic chemotherapy: Secondary | ICD-10-CM | POA: Diagnosis present

## 2019-09-05 DIAGNOSIS — Z5189 Encounter for other specified aftercare: Secondary | ICD-10-CM | POA: Insufficient documentation

## 2019-09-05 DIAGNOSIS — I10 Essential (primary) hypertension: Secondary | ICD-10-CM | POA: Insufficient documentation

## 2019-09-05 DIAGNOSIS — E876 Hypokalemia: Secondary | ICD-10-CM | POA: Insufficient documentation

## 2019-09-05 LAB — CBC WITH DIFFERENTIAL (CANCER CENTER ONLY)
Abs Immature Granulocytes: 0.07 10*3/uL (ref 0.00–0.07)
Basophils Absolute: 0 10*3/uL (ref 0.0–0.1)
Basophils Relative: 0 %
Eosinophils Absolute: 0.1 10*3/uL (ref 0.0–0.5)
Eosinophils Relative: 1 %
HCT: 23.8 % — ABNORMAL LOW (ref 39.0–52.0)
Hemoglobin: 7.6 g/dL — ABNORMAL LOW (ref 13.0–17.0)
Immature Granulocytes: 1 %
Lymphocytes Relative: 19 %
Lymphs Abs: 1.6 10*3/uL (ref 0.7–4.0)
MCH: 26.7 pg (ref 26.0–34.0)
MCHC: 31.9 g/dL (ref 30.0–36.0)
MCV: 83.5 fL (ref 80.0–100.0)
Monocytes Absolute: 0.8 10*3/uL (ref 0.1–1.0)
Monocytes Relative: 10 %
Neutro Abs: 5.6 10*3/uL (ref 1.7–7.7)
Neutrophils Relative %: 69 %
Platelet Count: 169 10*3/uL (ref 150–400)
RBC: 2.85 MIL/uL — ABNORMAL LOW (ref 4.22–5.81)
RDW: 15.5 % (ref 11.5–15.5)
WBC Count: 8.1 10*3/uL (ref 4.0–10.5)
nRBC: 0 % (ref 0.0–0.2)

## 2019-09-05 LAB — CMP (CANCER CENTER ONLY)
ALT: 19 U/L (ref 0–44)
AST: 18 U/L (ref 15–41)
Albumin: 2.8 g/dL — ABNORMAL LOW (ref 3.5–5.0)
Alkaline Phosphatase: 130 U/L — ABNORMAL HIGH (ref 38–126)
Anion gap: 10 (ref 5–15)
BUN: 9 mg/dL (ref 8–23)
CO2: 23 mmol/L (ref 22–32)
Calcium: 9.7 mg/dL (ref 8.9–10.3)
Chloride: 101 mmol/L (ref 98–111)
Creatinine: 1.03 mg/dL (ref 0.61–1.24)
GFR, Est AFR Am: >60 mL/min (ref >60)
GFR, Estimated: >60 mL/min (ref >60)
Glucose, Bld: 99 mg/dL (ref 70–99)
Potassium: 4.1 mmol/L (ref 3.5–5.1)
Sodium: 134 mmol/L — ABNORMAL LOW (ref 135–145)
Total Bilirubin: 2 mg/dL — ABNORMAL HIGH (ref 0.3–1.2)
Total Protein: 6.7 g/dL (ref 6.5–8.1)

## 2019-09-05 MED ORDER — SODIUM CHLORIDE 0.9 % IV SOLN
400.0000 mg/m2 | Freq: Once | INTRAVENOUS | Status: AC
  Administered 2019-09-05: 828 mg via INTRAVENOUS
  Filled 2019-09-05: qty 41.4

## 2019-09-05 MED ORDER — SODIUM CHLORIDE 0.9% FLUSH
10.0000 mL | Freq: Once | INTRAVENOUS | Status: AC
  Administered 2019-09-05: 10 mL
  Filled 2019-09-05: qty 10

## 2019-09-05 MED ORDER — PALONOSETRON HCL INJECTION 0.25 MG/5ML
INTRAVENOUS | Status: AC
  Filled 2019-09-05: qty 5

## 2019-09-05 MED ORDER — DEXTROSE 5 % IV SOLN
Freq: Once | INTRAVENOUS | Status: AC
  Filled 2019-09-05: qty 250

## 2019-09-05 MED ORDER — SODIUM CHLORIDE 0.9 % IV SOLN
2000.0000 mg/m2 | INTRAVENOUS | Status: DC
  Administered 2019-09-05: 4150 mg via INTRAVENOUS
  Filled 2019-09-05: qty 83

## 2019-09-05 MED ORDER — OXALIPLATIN CHEMO INJECTION 100 MG/20ML
72.0000 mg/m2 | Freq: Once | INTRAVENOUS | Status: AC
  Administered 2019-09-05: 150 mg via INTRAVENOUS
  Filled 2019-09-05: qty 30

## 2019-09-05 MED ORDER — ATROPINE SULFATE 1 MG/ML IJ SOLN
0.5000 mg | Freq: Once | INTRAMUSCULAR | Status: AC | PRN
  Administered 2019-09-05: 0.5 mg via INTRAVENOUS

## 2019-09-05 MED ORDER — SODIUM CHLORIDE 0.9 % IV SOLN
130.0000 mg/m2 | Freq: Once | INTRAVENOUS | Status: AC
  Administered 2019-09-05: 260 mg via INTRAVENOUS
  Filled 2019-09-05: qty 13

## 2019-09-05 MED ORDER — SODIUM CHLORIDE 0.9 % IV SOLN
150.0000 mg | Freq: Once | INTRAVENOUS | Status: AC
  Administered 2019-09-05: 150 mg via INTRAVENOUS
  Filled 2019-09-05: qty 150

## 2019-09-05 MED ORDER — SODIUM CHLORIDE 0.9 % IV SOLN
10.0000 mg | Freq: Once | INTRAVENOUS | Status: AC
  Administered 2019-09-05: 10 mg via INTRAVENOUS
  Filled 2019-09-05: qty 10

## 2019-09-05 MED ORDER — ATROPINE SULFATE 1 MG/ML IJ SOLN
INTRAMUSCULAR | Status: AC
  Filled 2019-09-05: qty 1

## 2019-09-05 MED ORDER — PALONOSETRON HCL INJECTION 0.25 MG/5ML
0.2500 mg | Freq: Once | INTRAVENOUS | Status: AC
  Administered 2019-09-05: 0.25 mg via INTRAVENOUS
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
## 2019-09-05 NOTE — Patient Instructions (Signed)
[facility] Discharge Instructions for Patients Receiving Chemotherapy  Today you received the following chemotherapy agents: Oxaliplatin/Irinotecan/Leucovorin/5FU.  To help prevent nausea and vomiting after your treatment, we encourage you to take your nausea medication as directed.   If you develop nausea and vomiting that is not controlled by your nausea medication, call the clinic.   BELOW ARE SYMPTOMS THAT SHOULD BE REPORTED IMMEDIATELY:  *FEVER GREATER THAN 100.5 F  *CHILLS WITH OR WITHOUT FEVER  NAUSEA AND VOMITING THAT IS NOT CONTROLLED WITH YOUR NAUSEA MEDICATION  *UNUSUAL SHORTNESS OF BREATH  *UNUSUAL BRUISING OR BLEEDING  TENDERNESS IN MOUTH AND THROAT WITH OR WITHOUT PRESENCE OF ULCERS  *URINARY PROBLEMS  *BOWEL PROBLEMS  UNUSUAL RASH Items with * indicate a potential emergency and should be followed up as soon as possible.  Feel free to call the clinic should you have any questions or concerns. The clinic phone number is [phone].  Please show the CHEMO ALERT CARD at check-in to the Emergency Department and triage nurse.   

## 2019-09-05 NOTE — Progress Notes (Signed)
Per Cira Rue, NP okay to treat with hgb 7.6 and bilirubin 2.0.

## 2019-09-06 ENCOUNTER — Telehealth: Payer: Self-pay | Admitting: Nurse Practitioner

## 2019-09-06 ENCOUNTER — Ambulatory Visit: Payer: Medicare Other | Admitting: Physician Assistant

## 2019-09-06 LAB — CANCER ANTIGEN 19-9: CA 19-9: 1037 U/mL — ABNORMAL HIGH (ref 0–35)

## 2019-09-06 NOTE — Telephone Encounter (Signed)
Scheduled per 8/2 los. Noted to give pt appt calendar on next visit.

## 2019-09-07 ENCOUNTER — Other Ambulatory Visit: Payer: Self-pay

## 2019-09-07 ENCOUNTER — Inpatient Hospital Stay: Payer: No Typology Code available for payment source

## 2019-09-07 ENCOUNTER — Other Ambulatory Visit: Payer: Self-pay | Admitting: Hematology

## 2019-09-07 VITALS — BP 119/66 | HR 58 | Temp 97.9°F | Resp 18 | Ht 71.0 in | Wt 163.2 lb

## 2019-09-07 DIAGNOSIS — C250 Malignant neoplasm of head of pancreas: Secondary | ICD-10-CM

## 2019-09-07 DIAGNOSIS — Z5111 Encounter for antineoplastic chemotherapy: Secondary | ICD-10-CM | POA: Diagnosis not present

## 2019-09-07 LAB — PREPARE RBC (CROSSMATCH)

## 2019-09-07 MED ORDER — SODIUM CHLORIDE 0.9% FLUSH
10.0000 mL | INTRAVENOUS | Status: DC | PRN
  Administered 2019-09-07: 10 mL
  Filled 2019-09-07: qty 10

## 2019-09-07 MED ORDER — SODIUM CHLORIDE 0.9% IV SOLUTION
250.0000 mL | Freq: Once | INTRAVENOUS | Status: AC
  Administered 2019-09-07: 250 mL via INTRAVENOUS
  Filled 2019-09-07: qty 250

## 2019-09-07 MED ORDER — PEGFILGRASTIM-CBQV 6 MG/0.6ML ~~LOC~~ SOSY
PREFILLED_SYRINGE | SUBCUTANEOUS | Status: AC
  Filled 2019-09-07: qty 0.6

## 2019-09-07 MED ORDER — PEGFILGRASTIM-CBQV 6 MG/0.6ML ~~LOC~~ SOSY
6.0000 mg | PREFILLED_SYRINGE | Freq: Once | SUBCUTANEOUS | Status: AC
  Administered 2019-09-07: 6 mg via SUBCUTANEOUS

## 2019-09-07 MED ORDER — HEPARIN SOD (PORK) LOCK FLUSH 100 UNIT/ML IV SOLN
500.0000 [IU] | Freq: Once | INTRAVENOUS | Status: AC | PRN
  Administered 2019-09-07: 500 [IU]
  Filled 2019-09-07: qty 5

## 2019-09-07 NOTE — Patient Instructions (Addendum)
Blood Transfusion, Adult, Care After This sheet gives you information about how to care for yourself after your procedure. Your doctor may also give you more specific instructions. If you have problems or questions, contact your doctor. What can I expect after the procedure? After the procedure, it is common to have:  Bruising and soreness at the IV site.  A fever or chills on the day of the procedure. This may be your body's response to the new blood cells received.  A headache. Follow these instructions at home: Insertion site care      Follow instructions from your doctor about how to take care of your insertion site. This is where an IV tube was put into your vein. Make sure you: ? Wash your hands with soap and water before and after you change your bandage (dressing). If you cannot use soap and water, use hand sanitizer. ? Change your bandage as told by your doctor.  Check your insertion site every day for signs of infection. Check for: ? Redness, swelling, or pain. ? Bleeding from the site. ? Warmth. ? Pus or a bad smell. General instructions  Take over-the-counter and prescription medicines only as told by your doctor.  Rest as told by your doctor.  Go back to your normal activities as told by your doctor.  Keep all follow-up visits as told by your doctor. This is important. Contact a doctor if:  You have itching or red, swollen areas of skin (hives).  You feel worried or nervous (anxious).  You feel weak after doing your normal activities.  You have redness, swelling, warmth, or pain around the insertion site.  You have blood coming from the insertion site, and the blood does not stop with pressure.  You have pus or a bad smell coming from the insertion site. Get help right away if:  You have signs of a serious reaction. This may be coming from an allergy or the body's defense system (immune system). Signs include: ? Trouble breathing or shortness of  breath. ? Swelling of the face or feeling warm (flushed). ? Fever or chills. ? Head, chest, or back pain. ? Dark pee (urine) or blood in the pee. ? Widespread rash. ? Fast heartbeat. ? Feeling dizzy or light-headed. You may receive your blood transfusion in an outpatient setting. If so, you will be told whom to contact to report any reactions. These symptoms may be an emergency. Do not wait to see if the symptoms will go away. Get medical help right away. Call your local emergency services (911 in the [place] Do not drive yourself to the hospital. Summary  Bruising and soreness at the IV site are common.  Check your insertion site every day for signs of infection.  Rest as told by your doctor. Go back to your normal activities as told by your doctor.  Get help right away if you have signs of a serious reaction. This information is not intended to replace advice given to you by your health care provider. Make sure you discuss any questions you have with your health care provider. Document Revised: 07/15/2018 Document Reviewed: 07/15/2018 Else[employer] Patient Education  [place].   Pegfilgrastim injection What is this medicine? PEGFILGRASTIM (PEG fil gra stim) is a long-acting granulocyte colony-stimulating factor that stimulates the growth of neutrophils, a type of white blood cell important in the body's fight against infection. It is used to reduce the incidence of fever and infection in patients with certain types of cancer  who are receiving chemotherapy that affects the bone marrow, and to increase survival after being exposed to high doses of radiation. This medicine may be used for other purposes; ask your health care provider or pharmacist if you have questions. COMMON BRAND NAME(S): Steve Rattler, Ziextenzo What should I tell my health care provider before I take this medicine? They need to know if you have any of these conditions:  kidney disease  latex  allergy  ongoing radiation therapy  sickle cell disease  skin reactions to acrylic adhesives (On-Body Injector only)  an unusual or allergic reaction to pegfilgrastim, filgrastim, other medicines, foods, dyes, or preservatives  pregnant or trying to get pregnant  breast-feeding How should I use this medicine? This medicine is for injection under the skin. If you get this medicine at home, you will be taught how to prepare and give the pre-filled syringe or how to use the On-body Injector. Refer to the patient Instructions for Use for detailed instructions. Use exactly as directed. Tell your healthcare provider immediately if you suspect that the On-body Injector may not have performed as intended or if you suspect the use of the On-body Injector resulted in a missed or partial dose. It is important that you put your used needles and syringes in a special sharps container. Do not put them in a trash can. If you do not have a sharps container, call your pharmacist or healthcare provider to get one. Talk to your pediatrician regarding the use of this medicine in children. While this drug may be prescribed for selected conditions, precautions do apply. Overdosage: If you think you have taken too much of this medicine contact a poison control center or emergency room at once. NOTE: This medicine is only for you. Do not share this medicine with others. What if I miss a dose? It is important not to miss your dose. Call your doctor or health care professional if you miss your dose. If you miss a dose due to an On-body Injector failure or leakage, a new dose should be administered as soon as possible using a single prefilled syringe for manual use. What may interact with this medicine? Interactions have not been studied. Give your health care provider a list of all the medicines, herbs, non-prescription drugs, or dietary supplements you use. Also tell them if you smoke, drink alcohol, or use illegal  drugs. Some items may interact with your medicine. This list may not describe all possible interactions. Give your health care provider a list of all the medicines, herbs, non-prescription drugs, or dietary supplements you use. Also tell them if you smoke, drink alcohol, or use illegal drugs. Some items may interact with your medicine. What should I watch for while using this medicine? You may need blood work done while you are taking this medicine. If you are going to need a MRI, CT scan, or other procedure, tell your doctor that you are using this medicine (On-Body Injector only). What side effects may I notice from receiving this medicine? Side effects that you should report to your doctor or health care professional as soon as possible:  allergic reactions like skin rash, itching or hives, swelling of the face, lips, or tongue  back pain  dizziness  fever  pain, redness, or irritation at site where injected  pinpoint red spots on the skin  red or dark-brown urine  shortness of breath or breathing problems  stomach or side pain, or pain at the shoulder  swelling  tiredness  trouble passing urine or change in the amount of urine Side effects that usually do not require medical attention (report to your doctor or health care professional if they continue or are bothersome):  bone pain  muscle pain This list may not describe all possible side effects. Call your doctor for medical advice about side effects. You may report side effects to FDA at 1-800-FDA-1088. Where should I keep my medicine? Keep out of the reach of children. If you are using this medicine at home, you will be instructed on how to store it. Throw away any unused medicine after the expiration date on the label. NOTE: This sheet is a summary. It may not cover all possible information. If you have questions about this medicine, talk to your doctor, pharmacist, or health care provider.  2020 Elsevier/[employer]  Standard (2017-04-27 16:57:08)

## 2019-09-07 NOTE — Progress Notes (Signed)
Pt received 1 unit PRBCs today, tolerated well.  Able to eat/drink and use restroom as needed without any issues during tx.  VS stable.

## 2019-09-08 LAB — TYPE AND SCREEN
ABO/RH(D): A POS
Antibody Screen: NEGATIVE
Unit division: 0

## 2019-09-08 LAB — BPAM RBC
Blood Product Expiration Date: 202108192359
ISSUE DATE / TIME: 202108041316
Unit Type and Rh: 6200

## 2019-09-15 ENCOUNTER — Other Ambulatory Visit: Payer: Medicare Other

## 2019-09-15 ENCOUNTER — Ambulatory Visit: Payer: Medicare Other | Admitting: Nurse Practitioner

## 2019-09-15 ENCOUNTER — Ambulatory Visit: Payer: Medicare Other

## 2019-09-16 MED FILL — Dexamethasone Sodium Phosphate Inj 100 MG/10ML: INTRAMUSCULAR | Qty: 1 | Status: AC

## 2019-09-16 MED FILL — Fosaprepitant Dimeglumine For IV Infusion 150 MG (Base Eq): INTRAVENOUS | Qty: 5 | Status: AC
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
## 2019-09-19 ENCOUNTER — Inpatient Hospital Stay: Payer: No Typology Code available for payment source

## 2019-09-19 ENCOUNTER — Other Ambulatory Visit: Payer: Self-pay

## 2019-09-19 ENCOUNTER — Encounter: Payer: Self-pay | Admitting: Nurse Practitioner

## 2019-09-19 ENCOUNTER — Inpatient Hospital Stay (HOSPITAL_BASED_OUTPATIENT_CLINIC_OR_DEPARTMENT_OTHER): Payer: No Typology Code available for payment source | Admitting: Nurse Practitioner

## 2019-09-19 ENCOUNTER — Inpatient Hospital Stay: Payer: No Typology Code available for payment source | Admitting: Nutrition

## 2019-09-19 VITALS — BP 111/58 | HR 70 | Temp 97.4°F | Resp 17 | Ht 71.0 in | Wt 151.3 lb

## 2019-09-19 DIAGNOSIS — Z95828 Presence of other vascular implants and grafts: Secondary | ICD-10-CM

## 2019-09-19 DIAGNOSIS — C250 Malignant neoplasm of head of pancreas: Secondary | ICD-10-CM

## 2019-09-19 DIAGNOSIS — D72828 Other elevated white blood cell count: Secondary | ICD-10-CM

## 2019-09-19 DIAGNOSIS — Z5111 Encounter for antineoplastic chemotherapy: Secondary | ICD-10-CM | POA: Diagnosis not present

## 2019-09-19 LAB — CBC WITH DIFFERENTIAL (CANCER CENTER ONLY)
Abs Immature Granulocytes: 0.25 10*3/uL — ABNORMAL HIGH (ref 0.00–0.07)
Basophils Absolute: 0 10*3/uL (ref 0.0–0.1)
Basophils Relative: 0 %
Eosinophils Absolute: 0 10*3/uL (ref 0.0–0.5)
Eosinophils Relative: 0 %
HCT: 24.8 % — ABNORMAL LOW (ref 39.0–52.0)
Hemoglobin: 7.7 g/dL — ABNORMAL LOW (ref 13.0–17.0)
Immature Granulocytes: 2 %
Lymphocytes Relative: 10 %
Lymphs Abs: 1.3 10*3/uL (ref 0.7–4.0)
MCH: 24.8 pg — ABNORMAL LOW (ref 26.0–34.0)
MCHC: 31 g/dL (ref 30.0–36.0)
MCV: 80 fL (ref 80.0–100.0)
Monocytes Absolute: 1.2 10*3/uL — ABNORMAL HIGH (ref 0.1–1.0)
Monocytes Relative: 9 %
Neutro Abs: 10 10*3/uL — ABNORMAL HIGH (ref 1.7–7.7)
Neutrophils Relative %: 79 %
Platelet Count: 215 10*3/uL (ref 150–400)
RBC: 3.1 MIL/uL — ABNORMAL LOW (ref 4.22–5.81)
RDW: 15.8 % — ABNORMAL HIGH (ref 11.5–15.5)
WBC Count: 12.8 10*3/uL — ABNORMAL HIGH (ref 4.0–10.5)
nRBC: 0 % (ref 0.0–0.2)

## 2019-09-19 LAB — CMP (CANCER CENTER ONLY)
ALT: 13 U/L (ref 0–44)
AST: 14 U/L — ABNORMAL LOW (ref 15–41)
Albumin: 2.7 g/dL — ABNORMAL LOW (ref 3.5–5.0)
Alkaline Phosphatase: 155 U/L — ABNORMAL HIGH (ref 38–126)
Anion gap: 10 (ref 5–15)
BUN: 11 mg/dL (ref 8–23)
CO2: 20 mmol/L — ABNORMAL LOW (ref 22–32)
Calcium: 9.6 mg/dL (ref 8.9–10.3)
Chloride: 102 mmol/L (ref 98–111)
Creatinine: 1.1 mg/dL (ref 0.61–1.24)
GFR, Est AFR Am: >60 mL/min (ref >60)
GFR, Estimated: >60 mL/min (ref >60)
Glucose, Bld: 119 mg/dL — ABNORMAL HIGH (ref 70–99)
Potassium: 3.9 mmol/L (ref 3.5–5.1)
Sodium: 132 mmol/L — ABNORMAL LOW (ref 135–145)
Total Bilirubin: 1.5 mg/dL — ABNORMAL HIGH (ref 0.3–1.2)
Total Protein: 6.8 g/dL (ref 6.5–8.1)

## 2019-09-19 LAB — PREPARE RBC (CROSSMATCH)

## 2019-09-19 MED ORDER — SODIUM CHLORIDE 0.9% FLUSH
10.0000 mL | INTRAVENOUS | Status: DC | PRN
  Filled 2019-09-19: qty 10

## 2019-09-19 MED ORDER — SODIUM CHLORIDE 0.9 % IV SOLN
Freq: Once | INTRAVENOUS | Status: AC
  Filled 2019-09-19: qty 250

## 2019-09-19 MED ORDER — CIPROFLOXACIN HCL 500 MG PO TABS: 500.0000 mg | ORAL_TABLET | Freq: Two times a day (BID) | ORAL | 0 refills | Status: DC

## 2019-09-19 MED ORDER — SODIUM CHLORIDE 0.9% FLUSH
10.0000 mL | Freq: Once | INTRAVENOUS | Status: AC
  Administered 2019-09-19: 10 mL
  Filled 2019-09-19: qty 10

## 2019-09-19 MED ORDER — SODIUM CHLORIDE 0.9% IV SOLUTION
250.0000 mL | Freq: Once | INTRAVENOUS | Status: DC
  Filled 2019-09-19: qty 250

## 2019-09-19 MED ORDER — HEPARIN SOD (PORK) LOCK FLUSH 100 UNIT/ML IV SOLN
500.0000 [IU] | Freq: Every day | INTRAVENOUS | Status: DC | PRN
  Filled 2019-09-19: qty 5

## 2019-09-19 NOTE — Patient Instructions (Signed)

## 2019-09-19 NOTE — Patient Instructions (Signed)

## 2019-09-19 NOTE — Progress Notes (Signed)
Nutrition follow-up completed with patient in the infusion room.  Patient is being treated for pancreas cancer. Weight decreased and documented as 151.3 pounds on August 16 down from 163.2 pounds August 4.  This is a 7% weight loss in less than 2 weeks which is significant.  This is a 16% weight loss in less than 3 months. I was unable to do a nutrition focused physical exam because patient was working with the lab for a blood draw.  Suspect protein calorie malnutrition. Patient denies nausea, vomiting, constipation, and diarrhea. Reports he still does not tolerate boost or Ensure. Patient states he is able to eat chicken, pork chops, and fruit.  However he is only eating a few bites and then he is full. Patient agreed to try clear oral nutrition supplements today and liked them.  Nutrition diagnosis: Inadequate oral intake continues.  Intervention: Patient educated to increase oral intake and small frequent meals and snacks. Encourage patient to drink oral nutrition supplements 2-3 times daily and provided clear liquid samples.  Also provided coupons. Encouraged weight maintenance.  Monitoring, evaluation, goals: Patient will tolerate adequate calories and protein to minimize further weight loss.  Next visit: Monday, August 30 during infusion.  **Disclaimer: This note was dictated with voice recognition software. Similar sounding words can inadvertently be transcribed and this note may contain transcription errors which may not have been corrected upon publication of note.**

## 2019-09-20 ENCOUNTER — Telehealth: Payer: Self-pay | Admitting: Nurse Practitioner

## 2019-09-20 LAB — TYPE AND SCREEN
ABO/RH(D): A POS
Antibody Screen: NEGATIVE
Unit division: 0

## 2019-09-20 LAB — BPAM RBC
Blood Product Expiration Date: 202109062359
ISSUE DATE / TIME: 202108161152
Unit Type and Rh: 6200

## 2019-09-20 NOTE — Telephone Encounter (Signed)
Scheduled per 8/16 los. Spoke with pt's daughter and is aware of appt on 8/25.

## 2019-09-23 ENCOUNTER — Emergency Department (HOSPITAL_COMMUNITY): Payer: No Typology Code available for payment source

## 2019-09-23 ENCOUNTER — Inpatient Hospital Stay (HOSPITAL_COMMUNITY)
Admission: EM | Admit: 2019-09-23 | Discharge: 2019-09-28 | DRG: 872 | Disposition: A | Discharge status: Home Health | Payer: No Typology Code available for payment source | Attending: Internal Medicine | Admitting: Internal Medicine

## 2019-09-23 ENCOUNTER — Other Ambulatory Visit: Payer: Self-pay

## 2019-09-23 ENCOUNTER — Encounter (HOSPITAL_COMMUNITY): Payer: Self-pay

## 2019-09-23 DIAGNOSIS — Z8249 Family history of ischemic heart disease and other diseases of the circulatory system: Secondary | ICD-10-CM

## 2019-09-23 DIAGNOSIS — D72825 Bandemia: Secondary | ICD-10-CM

## 2019-09-23 DIAGNOSIS — N179 Acute kidney failure, unspecified: Secondary | ICD-10-CM | POA: Diagnosis present

## 2019-09-23 DIAGNOSIS — I10 Essential (primary) hypertension: Secondary | ICD-10-CM | POA: Diagnosis present

## 2019-09-23 DIAGNOSIS — E860 Dehydration: Secondary | ICD-10-CM | POA: Diagnosis present

## 2019-09-23 DIAGNOSIS — C801 Malignant (primary) neoplasm, unspecified: Secondary | ICD-10-CM

## 2019-09-23 DIAGNOSIS — Z20822 Contact with and (suspected) exposure to covid-19: Secondary | ICD-10-CM | POA: Diagnosis present

## 2019-09-23 DIAGNOSIS — C250 Malignant neoplasm of head of pancreas: Secondary | ICD-10-CM | POA: Diagnosis not present

## 2019-09-23 DIAGNOSIS — K831 Obstruction of bile duct: Secondary | ICD-10-CM | POA: Diagnosis not present

## 2019-09-23 DIAGNOSIS — A419 Sepsis, unspecified organism: Secondary | ICD-10-CM | POA: Diagnosis present

## 2019-09-23 DIAGNOSIS — T451X5A Adverse effect of antineoplastic and immunosuppressive drugs, initial encounter: Secondary | ICD-10-CM | POA: Diagnosis present

## 2019-09-23 DIAGNOSIS — R7989 Other specified abnormal findings of blood chemistry: Secondary | ICD-10-CM | POA: Diagnosis not present

## 2019-09-23 DIAGNOSIS — T85590A Other mechanical complication of bile duct prosthesis, initial encounter: Secondary | ICD-10-CM

## 2019-09-23 DIAGNOSIS — R0602 Shortness of breath: Secondary | ICD-10-CM | POA: Diagnosis present

## 2019-09-23 DIAGNOSIS — K8309 Other cholangitis: Secondary | ICD-10-CM | POA: Diagnosis present

## 2019-09-23 DIAGNOSIS — D689 Coagulation defect, unspecified: Secondary | ICD-10-CM | POA: Diagnosis present

## 2019-09-23 DIAGNOSIS — K5900 Constipation, unspecified: Secondary | ICD-10-CM | POA: Diagnosis present

## 2019-09-23 DIAGNOSIS — R5381 Other malaise: Secondary | ICD-10-CM | POA: Diagnosis present

## 2019-09-23 DIAGNOSIS — Z832 Family history of diseases of the blood and blood-forming organs and certain disorders involving the immune mechanism: Secondary | ICD-10-CM

## 2019-09-23 DIAGNOSIS — D84821 Immunodeficiency due to drugs: Secondary | ICD-10-CM | POA: Diagnosis present

## 2019-09-23 DIAGNOSIS — R1011 Right upper quadrant pain: Secondary | ICD-10-CM | POA: Diagnosis not present

## 2019-09-23 DIAGNOSIS — C258 Malignant neoplasm of overlapping sites of pancreas: Secondary | ICD-10-CM | POA: Diagnosis present

## 2019-09-23 DIAGNOSIS — E876 Hypokalemia: Secondary | ICD-10-CM | POA: Diagnosis present

## 2019-09-23 DIAGNOSIS — Z807 Family history of other malignant neoplasms of lymphoid, hematopoietic and related tissues: Secondary | ICD-10-CM

## 2019-09-23 DIAGNOSIS — D630 Anemia in neoplastic disease: Secondary | ICD-10-CM | POA: Diagnosis present

## 2019-09-23 DIAGNOSIS — D6959 Other secondary thrombocytopenia: Secondary | ICD-10-CM | POA: Diagnosis present

## 2019-09-23 DIAGNOSIS — E872 Acidosis: Secondary | ICD-10-CM | POA: Diagnosis present

## 2019-09-23 DIAGNOSIS — K810 Acute cholecystitis: Secondary | ICD-10-CM | POA: Diagnosis present

## 2019-09-23 DIAGNOSIS — E46 Unspecified protein-calorie malnutrition: Secondary | ICD-10-CM | POA: Diagnosis present

## 2019-09-23 DIAGNOSIS — R6520 Severe sepsis without septic shock: Secondary | ICD-10-CM | POA: Diagnosis present

## 2019-09-23 DIAGNOSIS — T39395A Adverse effect of other nonsteroidal anti-inflammatory drugs [NSAID], initial encounter: Secondary | ICD-10-CM | POA: Diagnosis present

## 2019-09-23 DIAGNOSIS — D638 Anemia in other chronic diseases classified elsewhere: Secondary | ICD-10-CM

## 2019-09-23 DIAGNOSIS — Z6821 Body mass index (BMI) 21.0-21.9, adult: Secondary | ICD-10-CM

## 2019-09-23 DIAGNOSIS — D649 Anemia, unspecified: Secondary | ICD-10-CM | POA: Diagnosis present

## 2019-09-23 DIAGNOSIS — Z95828 Presence of other vascular implants and grafts: Secondary | ICD-10-CM

## 2019-09-23 DIAGNOSIS — Z66 Do not resuscitate: Secondary | ICD-10-CM | POA: Diagnosis present

## 2019-09-23 DIAGNOSIS — R188 Other ascites: Secondary | ICD-10-CM | POA: Diagnosis not present

## 2019-09-23 DIAGNOSIS — Z4659 Encounter for fitting and adjustment of other gastrointestinal appliance and device: Secondary | ICD-10-CM | POA: Diagnosis not present

## 2019-09-23 DIAGNOSIS — I959 Hypotension, unspecified: Secondary | ICD-10-CM | POA: Diagnosis present

## 2019-09-23 DIAGNOSIS — C259 Malignant neoplasm of pancreas, unspecified: Secondary | ICD-10-CM | POA: Diagnosis present

## 2019-09-23 LAB — CBC WITH DIFFERENTIAL/PLATELET
Abs Immature Granulocytes: 3.36 10*3/uL — ABNORMAL HIGH (ref 0.00–0.07)
Basophils Absolute: 0.2 10*3/uL — ABNORMAL HIGH (ref 0.0–0.1)
Basophils Relative: 0 %
Eosinophils Absolute: 0.1 10*3/uL (ref 0.0–0.5)
Eosinophils Relative: 0 %
HCT: 26.3 % — ABNORMAL LOW (ref 39.0–52.0)
Hemoglobin: 8.6 g/dL — ABNORMAL LOW (ref 13.0–17.0)
Immature Granulocytes: 6 %
Lymphocytes Relative: 1 %
Lymphs Abs: 0.4 10*3/uL — ABNORMAL LOW (ref 0.7–4.0)
MCH: 27 pg (ref 26.0–34.0)
MCHC: 32.7 g/dL (ref 30.0–36.0)
MCV: 82.4 fL (ref 80.0–100.0)
Monocytes Absolute: 2 10*3/uL — ABNORMAL HIGH (ref 0.1–1.0)
Monocytes Relative: 4 %
Neutro Abs: 52.2 10*3/uL — ABNORMAL HIGH (ref 1.7–7.7)
Neutrophils Relative %: 89 %
Platelets: 172 10*3/uL (ref 150–400)
RBC: 3.19 MIL/uL — ABNORMAL LOW (ref 4.22–5.81)
RDW: 16.3 % — ABNORMAL HIGH (ref 11.5–15.5)
WBC: 58.2 10*3/uL (ref 4.0–10.5)
nRBC: 0 % (ref 0.0–0.2)

## 2019-09-23 LAB — PROTIME-INR
INR: 1.4 — ABNORMAL HIGH (ref 0.8–1.2)
Prothrombin Time: 16.4 seconds — ABNORMAL HIGH (ref 11.4–15.2)

## 2019-09-23 LAB — BASIC METABOLIC PANEL
Anion gap: 14 (ref 5–15)
BUN: 17 mg/dL (ref 8–23)
CO2: 19 mmol/L — ABNORMAL LOW (ref 22–32)
Calcium: 8.6 mg/dL — ABNORMAL LOW (ref 8.9–10.3)
Chloride: 100 mmol/L (ref 98–111)
Creatinine, Ser: 2.02 mg/dL — ABNORMAL HIGH (ref 0.61–1.24)
GFR calc Af Amer: 39 mL/min — ABNORMAL LOW (ref >60)
GFR calc non Af Amer: 34 mL/min — ABNORMAL LOW (ref >60)
Glucose, Bld: 145 mg/dL — ABNORMAL HIGH (ref 70–99)
Potassium: 3.5 mmol/L (ref 3.5–5.1)
Sodium: 133 mmol/L — ABNORMAL LOW (ref 135–145)

## 2019-09-23 LAB — TYPE AND SCREEN
ABO/RH(D): A POS
Antibody Screen: NEGATIVE

## 2019-09-23 LAB — HEPATIC FUNCTION PANEL
ALT: 24 U/L (ref 0–44)
AST: 35 U/L (ref 15–41)
Albumin: 2.6 g/dL — ABNORMAL LOW (ref 3.5–5.0)
Alkaline Phosphatase: 292 U/L — ABNORMAL HIGH (ref 38–126)
Bilirubin, Direct: 3.9 mg/dL — ABNORMAL HIGH (ref 0.0–0.2)
Indirect Bilirubin: 1.9 mg/dL — ABNORMAL HIGH (ref 0.3–0.9)
Total Bilirubin: 5.8 mg/dL — ABNORMAL HIGH (ref 0.3–1.2)
Total Protein: 6.6 g/dL (ref 6.5–8.1)

## 2019-09-23 LAB — LACTIC ACID, PLASMA
Lactic Acid, Venous: 2.1 mmol/L (ref 0.5–1.9)
Lactic Acid, Venous: 2 mmol/L (ref 0.5–1.9)

## 2019-09-23 LAB — SARS CORONAVIRUS 2 BY RT PCR (HOSPITAL ORDER, PERFORMED IN ~~LOC~~ HOSPITAL LAB): SARS Coronavirus 2: NEGATIVE

## 2019-09-23 LAB — LIPASE, BLOOD: Lipase: 20 U/L (ref 11–51)

## 2019-09-23 MED ORDER — PROCHLORPERAZINE EDISYLATE 10 MG/2ML IJ SOLN
10.0000 mg | Freq: Once | INTRAMUSCULAR | Status: AC
  Administered 2019-09-23: 10 mg via INTRAVENOUS
  Filled 2019-09-23: qty 2

## 2019-09-23 MED ORDER — SODIUM CHLORIDE 0.9 % IV BOLUS: 500.0000 mL | Freq: Once | INTRAVENOUS | Status: DC

## 2019-09-23 MED ORDER — SODIUM CHLORIDE 0.9 % IV BOLUS: 1500.0000 mL | Freq: Once | INTRAVENOUS | Status: DC

## 2019-09-23 MED ORDER — MENTHOL 3 MG MT LOZG
1.0000 | LOZENGE | OROMUCOSAL | Status: DC | PRN
  Filled 2019-09-23: qty 9

## 2019-09-23 MED ORDER — LIP MEDEX EX OINT
1.0000 "application " | TOPICAL_OINTMENT | Freq: Two times a day (BID) | CUTANEOUS | Status: DC
  Administered 2019-09-23 – 2019-09-28 (×9): 1 via TOPICAL
  Filled 2019-09-23 (×3): qty 7

## 2019-09-23 MED ORDER — FENTANYL CITRATE (PF) 100 MCG/2ML IJ SOLN
50.0000 ug | Freq: Once | INTRAMUSCULAR | Status: AC
  Administered 2019-09-23: 50 ug via INTRAVENOUS
  Filled 2019-09-23: qty 2

## 2019-09-23 MED ORDER — ACETAMINOPHEN 325 MG PO TABS: 325.0000 mg | ORAL_TABLET | Freq: Four times a day (QID) | ORAL | Status: DC | PRN

## 2019-09-23 MED ORDER — PROCHLORPERAZINE EDISYLATE 10 MG/2ML IJ SOLN: 5.0000 mg | INTRAMUSCULAR | Status: DC | PRN

## 2019-09-23 MED ORDER — ONDANSETRON HCL 4 MG/2ML IJ SOLN: 4.0000 mg | Freq: Four times a day (QID) | INTRAMUSCULAR | Status: DC | PRN

## 2019-09-23 MED ORDER — PIPERACILLIN-TAZOBACTAM 3.375 G IVPB
3.3750 g | Freq: Three times a day (TID) | INTRAVENOUS | Status: DC
  Administered 2019-09-23 – 2019-09-28 (×14): 3.375 g via INTRAVENOUS
  Filled 2019-09-23 (×13): qty 50

## 2019-09-23 MED ORDER — PIPERACILLIN-TAZOBACTAM 3.375 G IVPB 30 MIN
3.3750 g | Freq: Once | INTRAVENOUS | Status: AC
  Administered 2019-09-23: 3.375 g via INTRAVENOUS
  Filled 2019-09-23: qty 50

## 2019-09-23 MED ORDER — METHOCARBAMOL 1000 MG/10ML IJ SOLN
1000.0000 mg | Freq: Four times a day (QID) | INTRAVENOUS | Status: DC | PRN
  Filled 2019-09-23: qty 10

## 2019-09-23 MED ORDER — BISACODYL 10 MG RE SUPP: 10.0000 mg | Freq: Two times a day (BID) | RECTAL | Status: DC | PRN

## 2019-09-23 MED ORDER — GUAIFENESIN-DM 100-10 MG/5ML PO SYRP
10.0000 mL | ORAL_SOLUTION | ORAL | Status: DC | PRN
  Administered 2019-09-26 – 2019-09-27 (×2): 10 mL via ORAL
  Filled 2019-09-23 (×2): qty 10

## 2019-09-23 MED ORDER — PHENOL 1.4 % MT LIQD
1.0000 | OROMUCOSAL | Status: DC | PRN
  Filled 2019-09-23: qty 177

## 2019-09-23 MED ORDER — ALUM & MAG HYDROXIDE-SIMETH 200-200-20 MG/5ML PO SUSP: 30.0000 mL | Freq: Four times a day (QID) | ORAL | Status: DC | PRN

## 2019-09-23 MED ORDER — SODIUM CHLORIDE 0.9 % IV BOLUS
2500.0000 mL | Freq: Once | INTRAVENOUS | Status: AC
  Administered 2019-09-23: 2500 mL via INTRAVENOUS

## 2019-09-23 MED ORDER — SODIUM CHLORIDE 0.9 % IV SOLN: Freq: Once | INTRAVENOUS | Status: AC

## 2019-09-23 MED ORDER — HYDROCORTISONE 1 % EX CREA
1.0000 "application " | TOPICAL_CREAM | Freq: Three times a day (TID) | CUTANEOUS | Status: DC | PRN
  Filled 2019-09-23: qty 28

## 2019-09-23 MED ORDER — MAGIC MOUTHWASH
15.0000 mL | Freq: Four times a day (QID) | ORAL | Status: DC | PRN
  Filled 2019-09-23: qty 15

## 2019-09-23 MED ORDER — ACETAMINOPHEN 650 MG RE SUPP: 650.0000 mg | Freq: Four times a day (QID) | RECTAL | Status: DC | PRN

## 2019-09-23 MED ORDER — SODIUM CHLORIDE (PF) 0.9 % IJ SOLN
INTRAMUSCULAR | Status: AC
  Filled 2019-09-23: qty 50

## 2019-09-23 MED ORDER — HYDROMORPHONE HCL 1 MG/ML IJ SOLN
0.5000 mg | INTRAMUSCULAR | Status: DC | PRN
  Administered 2019-09-23 – 2019-09-25 (×4): 0.5 mg via INTRAVENOUS
  Filled 2019-09-23 (×2): qty 1

## 2019-09-23 MED ORDER — MORPHINE SULFATE (PF) 2 MG/ML IV SOLN: 2.0000 mg | Freq: Once | INTRAVENOUS | Status: DC

## 2019-09-23 MED ORDER — FENTANYL CITRATE (PF) 100 MCG/2ML IJ SOLN: 50.0000 ug | Freq: Once | INTRAMUSCULAR | Status: DC

## 2019-09-23 MED ORDER — HYDROCORTISONE (PERIANAL) 2.5 % EX CREA
1.0000 "application " | TOPICAL_CREAM | Freq: Four times a day (QID) | CUTANEOUS | Status: DC | PRN
  Filled 2019-09-23: qty 28.35

## 2019-09-23 MED ORDER — SODIUM CHLORIDE 0.9 % IV SOLN
8.0000 mg | Freq: Four times a day (QID) | INTRAVENOUS | Status: DC | PRN
  Filled 2019-09-23: qty 4

## 2019-09-23 NOTE — Progress Notes (Addendum)
HEMATOLOGY-ONCOLOGY PROGRESS NOTE  SUBJECTIVE: The patient presented to the emergency room with worsening abdominal pain.  In the ER he was noted to be hypotensive and received IV fluids.  Labs on admission notable for WBC 58.2, hemoglobin 8.6, sodium 133, creatinine 2.02 (was 1.1 four days ago), calcium 8.6, albumin 2.6, alkaline phosphatase 292, total bilirubin 5.8, direct bilirubin 3.9, indirect bilirubin 1.9.  CT of the abdomen pelvis showed findings highly suspicious for acute cholecystitis, gallbladder distention and pericholecystic edema in the setting of gallstones, suboptimal evaluation of the pancreatic primary secondary to noncontrast technique, common duct stent in place with pneumobilia.  Abdominal ultrasound showed abnormal appearance of the gallbladder which is distended by sludge and small stones, irregular gallbladder wall thickening and pericholecystic edema.  Findings suspicious for acute cholecystitis.  The patient has been seen by general surgery.  They have recommended GI evaluation to determine if the patient needs biliary stent exchange, continuation of antibiotics, and consideration of HIDA scan if the patient continues to have findings suspicious for acute cholecystitis after the stent is evaluated.  They have deemed that he is not a good surgical candidate and could benefit from a percutaneous biliary drain.  The patient reports that he has been seen by GI but consult note currently pending.  The patient has been on chemotherapy with mFOLFIRINOX.  This was held on 09/19/2019.  The patient was seen in the emergency room today.  Daughter is at the bedside.  He reports that his abdominal pain has improved after receiving pain medication.  Pain is on the right side of his abdomen.  He reports a poor appetite, nausea, vomiting, diarrhea.  Daughter does not think he is is jaundiced as he has been in the past.  Denies chest pain or shortness of breath.  Bleeding reported.  The patient  reported that he had sweats and felt very cold at one point yesterday.  He did not check his temperature.  He is not aware of any fevers.  No elevated temperature documented here in the ER.  Oncology History Overview Note  Cancer Staging Malignant neoplasm of pancreas [facility]) Staging form: Exocrine Pancreas, AJCC 8th Edition - Clinical stage from 05/19/2019: Stage III (cT4, cN1, cM0) - Signed by Truitt Merle, MD on 05/24/2019    Malignant neoplasm of pancreas ([place])  05/10/2019 Tumor Marker   Baseline  CEA at 31.1 Ca 19-9 at 2411   05/11/2019 Procedure   ERCP by Dr Henrene Pastor 05/11/19  IMPRESSION 1. Malignant appearing distal bile duct stricture with upstream dilation. Status post ERCP with sphincterotomy and biliary stent placement   05/13/2019 Imaging   CT Chest and Pancreas 05/13/19 IMPRESSION: 1. Interval placement of common bile duct stent with decompression of the bile ducts. Pneumobilia is now noted compatible with biliary patency. 2. Diffuse infiltrative process involving the head, neck, body and tail of pancreas is identified. The diffusely infiltrative appearance of the pancreas is somewhat unusual. Although favored to represent pancreatic adenocarcinoma, other etiologies to consider include IgG4-related Sclerosing Disease of the pancreas. 3. There is encasement and narrowing of the portal venous confluence and proximal portal vein. Mild soft tissue stranding extends to but does not encase the superior mesenteric artery. No convincing evidence for involvement of the celiac trunk. 4. Borderline enlarged portacaval node. No convincing evidence for liver metastasis or metastatic disease to the chest. 5. Aortic atherosclerosis. Aortic Atherosclerosis (ICD10-I70.0).   05/19/2019 Initial Diagnosis   Malignant neoplasm of pancreas ([place])   05/19/2019 Cancer Staging   Staging  form: Exocrine Pancreas, AJCC 8th Edition - Clinical stage from 05/19/2019: Stage III (cT4, cN1, cM0) - Signed by Truitt Merle, MD on 05/24/2019   05/19/2019 Procedure   EUS by Dr Ardis Hughs 05/19/19 - Large mass involving much of the pancreatic parenchyma, clear encasing the PV and possibly involving the SMA as well. The mass was sampled with 3 transduodenal EUS FNB passes and the preliminary cytology was positive for malignancy (adenocarcinoma). - Previously placed plastic biliary stent was in the CBD in good position.    05/19/2019 Initial Biopsy   A. 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   06/08/2019 Procedure   PAC placed    06/16/2019 -  Chemotherapy   FOLFIRINOX q2weeks starting 06/16/19   08/04/2019 Imaging   US Abdomen  IMPRESSION: 1. Gallbladder mildly distended with sludge and tiny gallstones. No gallbladder wall thickening or pericholecystic fluid.   2. Biliary stent present with pneumobilia, likely due to stent present.   3. Much of pancreas obscured by gas. Visualized portions of pancreas appear grossly unremarkable.   4. Increased renal echogenicity, likely indicative of medical renal disease. No obstructing focus in either kidney. Cysts noted in each kidney.   08/10/2019 Imaging   MRI  IMPRESSION: 1. Infiltrative hypoenhancement in the pancreatic body, most of the pancreatic tail, and extending into a significant portion of the pancreatic head. This appearance in combination with the abnormal cytology on prior FNA is suspicious for an infiltrative pancreatic cancer involving most of the parenchyma of the pancreas, with sparing of the tip of the pancreatic tail and a small portion of the pancreatic head. 2. The common hepatic duct and common bile duct is obscured by low signal over an approximately 4.7 cm segment, although the lack of intrahepatic biliary dilatation suggests  that the stent is still in place and functional. 3. Cholelithiasis with mild gallbladder wall thickening. There is some accentuation of enhancement along the gallbladder fossa which can sometimes correlate with gallbladder inflammation causing local hyperemia. 4. Trace ascites.      REVIEW OF SYSTEMS:   Constitutional: Denies fevers, chills Respiratory: Denies cough, dyspnea or wheezes Cardiovascular: Denies palpitation, chest discomfort Gastrointestinal: Reports right-sided abdominal pain with nausea, vomiting, diarrhea Skin: Denies abnormal skin rashes Lymphatics: Denies new lymphadenopathy or easy bruising Neurological:Denies numbness, tingling or new weaknesses Behavioral/Psych: Mood is stable, no new changes  Extremities: No lower extremity edema All other systems were reviewed with the patient and are negative.  I have reviewed the past medical history, past surgical history, social history and family history with the patient and they are unchanged from previous note.   PHYSICAL EXAMINATION: ECOG PERFORMANCE STATUS: 1 - Symptomatic but completely ambulatory  Vitals:   09/23/19 1001 09/23/19 1030  BP: (!) 82/51 (!) 92/56  Pulse: 67 72  Resp: 16 (!) 23  Temp:    SpO2: 100% 100%   Filed Weights   09/23/19 0849  Weight: 69.4 kg    Intake/Output from previous day: No intake/output data recorded.  GENERAL: Alert, ill-appearing, no distress SKIN: skin color, texture, turgor are normal, no rashes or significant lesions EYES: Mild scleral icterus OROPHARYNX: No thrush or mucositis LUNGS: clear to auscultation and percussion with normal breathing effort HEART: regular rate & rhythm and no murmurs and no lower extremity edema ABDOMEN: Positive  bowel sounds, tenderness in the epigastric and right upper quadrant area with palpation NEURO: alert & oriented x 3 with fluent speech, no focal motor/sensory deficits  LABORATORY DATA:  I have reviewed the data as listed CMP  Latest Ref Rng & Units 09/23/2019 09/19/2019 09/05/2019  Glucose 70 - 99 mg/dL 145(H) 119(H) 99  BUN 8 - 23 mg/dL 17 11 9   Creatinine 0.61 - 1.24 mg/dL 2.02(H) 1.10 1.03  Sodium 135 - 145 mmol/L 133(L) 132(L) 134(L)  Potassium 3.5 - 5.1 mmol/L 3.5 3.9 4.1  Chloride 98 - 111 mmol/L 100 102 101  CO2 22 - 32 mmol/L 19(L) 20(L) 23  Calcium 8.9 - 10.3 mg/dL 8.6(L) 9.6 9.7  Total Protein 6.5 - 8.1 g/dL 6.6 6.8 6.7  Total Bilirubin 0.3 - 1.2 mg/dL 5.8(H) 1.5(H) 2.0(H)  Alkaline Phos 38 - 126 U/L 292(H) 155(H) 130(H)  AST 15 - 41 U/L 35 14(L) 18  ALT 0 - 44 U/L 24 13 19     Lab Results  Component Value Date   WBC 58.2 (HH) 09/23/2019   HGB 8.6 (L) 09/23/2019   HCT 26.3 (L) 09/23/2019   MCV 82.4 09/23/2019   PLT 172 09/23/2019   NEUTROABS 52.2 (H) 09/23/2019    CT ABDOMEN PELVIS WO CONTRAST  Result Date: 09/23/2019 CLINICAL DATA:  Nausea and vomiting. Weakness and hypertension for 1 week. History of pancreatic cancer. EXAM: CT ABDOMEN AND PELVIS WITHOUT CONTRAST TECHNIQUE: Multidetector CT imaging of the abdomen and pelvis was performed following the standard protocol without IV contrast. COMPARISON:  08/10/2019 MRI.  CT of 05/13/2019 FINDINGS: Lower chest: Clear lung bases. Normal heart size without pericardial or pleural effusion. Hepatobiliary: Limited evaluation for focal liver lesion. Common duct stent in place, with pneumobilia suggesting stent patency. Gallstones. Mild gallbladder distension with pericholecystic edema, including on 34/3. Pancreas: The pancreatic primary is poorly evaluated on this noncontrast exam. No gross pancreatic duct dilatation. No convincing evidence of pancreatitis. Spleen: Normal in size, without focal abnormality. Adrenals/Urinary Tract: Normal adrenal glands. No renal calculi or hydronephrosis. 2.0 cm interpolar left renal low-density lesion is likely a cyst. Mild right renal cortical thinning. No renal calculi or hydronephrosis. No bladder calculi. Stomach/Bowel:  Normal stomach, without wall thickening. Scattered colonic diverticula. Normal terminal ileum and appendix. Normal small bowel. Vascular/Lymphatic: No gross arterial encasement. No abdominopelvic adenopathy. An 11 mm upper normal portal caval node is similar to 12 mm on the prior. 21/3 today. Reproductive: Normal prostate. Other: No significant free fluid. No evidence of omental or peritoneal disease. Musculoskeletal: Prior trauma or surgery involving right iliac crest. Mild wedging of the T12 vertebral body, chronic. IMPRESSION: 1. Findings highly suspicious for acute cholecystitis. Gallbladder distension and pericholecystic edema in the setting of gallstones. Depending on clinical concern, ultrasound could confirm. 2. Suboptimal evaluation of the pancreatic primary secondary to noncontrast technique. Common duct stent in place with pneumobilia. 3.  Aortic Atherosclerosis (ICD10-I70.0). Electronically Signed   By: Abigail Miyamoto M.D.   On: 09/23/2019 11:05   DG Chest Portable 1 View  Result Date: 09/23/2019 CLINICAL DATA:  Cough.  History of pancreatic cancer EXAM: PORTABLE CHEST 1 VIEW COMPARISON:  08/08/2019 FINDINGS: The heart size and mediastinal contours are within normal limits. Both lungs are clear. The visualized skeletal structures are unremarkable. Port-A-Cath tip in the lower SVC unchanged from the prior study. IMPRESSION: No active disease. Electronically Signed   By: Franchot Gallo M.D.   On: 09/23/2019 12:38   US Abdomen Limited RUQ  Result Date: 09/23/2019  CLINICAL DATA:  Nausea and vomiting. History of pancreatic cancer and biliary stent EXAM: ULTRASOUND ABDOMEN LIMITED RIGHT UPPER QUADRANT COMPARISON:  CT 09/23/2019 FINDINGS: Gallbladder: Gallbladder lumen is completely filled with heterogeneous sludge and small shadowing stones. Irregular wall thickening with associated pericholecystic fluid. No sonographic Murphy sign noted by sonographer. Common bile duct: Diameter: 4 mm.  Pneumobilia  noted. Liver: Reverberation artifact within the intrahepatic biliary ducts compatible with pneumobilia. No focal lesion identified. Within normal limits in parenchymal echogenicity. Portal vein is patent on color Doppler imaging with normal direction of blood flow towards the liver. Other: None. IMPRESSION: 1. Abnormal appearance of the gallbladder which is distended by sludge and small stones. Irregular gallbladder wall thickening and pericholecystic edema. Findings are suspicious for acute cholecystitis. 2. Pneumobilia. Electronically Signed   By: Davina Poke D.O.   On: 09/23/2019 11:43    ASSESSMENT AND PLAN: 1.  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  Malignant biliary stricture 3. ? Acute cholecystitis 4.  Leukocytosis 5.  Anemia 6.  Hyperbilirubinemia 7.  Protein calorie malnutrition/weight loss 8.  Alcohol abuse 9.  Hypertension 10.  History of severe trauma with disability  -The patient has been receiving chemotherapy for his pancreatic cancer.  Last cycle was held due to anemia.  We will discuss further chemotherapy as an outpatient. -The patient has a malignant biliary stricture and now with?  Acute cholecystitis.  Has been evaluated by general surgery who has recommended GI evaluation of stent.  No immediate plans to bring to the OR.  They recommend IV antibiotics and consideration of HIDA scan if biliary stent is nonobstructing.  They would also recommend consideration of percutaneous biliary drain if IV antibiotics do not improve his condition. -Leukocytosis secondary to biliary obstruction.  Monitor WBC closely. -Hemoglobin stable at this time.  Monitor hemoglobin transfuse for hemoglobin less than 7. -Monitor T bili and LFTs closely. -Recommend dietitian consult for malnutrition and weight loss. -Management of chronic medical conditions per hospitalist.   LOS: 0 days   Mikey Bussing, DNP, AGPCNP-BC, AOCNP 09/23/19  Addendum  I have  seen the patient, examined him. I agree with the assessment and and plan and have edited the notes.   Patient had recurrent right upper quadrant abdominal pain for the past week, see EMR office 4 days ago, chemo was held due to the concern of recurrent cholangitis, and Cipro was called in.  However he developed fever, chills, worsening abdominal pain, nausea vomiting last night. lab reviewed, presented to ED with hypotension, significant leukocytosis, transaminitis and mild hyperbilirubinemia.  I have reviewed his CT and ultrasound also, and discussed with patient and his daughter at bedside. He is on antibiotics.  He has been seen by GI and surgical team, plan for ERCP and stent exchange in the near future. I agree with the plan, and we will f/u on Monday.  He is immunocompromised due to his chemotherapy, will hold chemo for next week until he recovers.  Truitt Merle  09/23/2019

## 2019-09-23 NOTE — Consult Note (Signed)
[name] [birth year]  [id number].    Requesting MD: Dr. Ronnald Nian Chief Complaint/Reason for Consult: Pancreatitic cancer on Chemo, possible cholecystitis   HPI: Isaac Doyle is a 64 y.o. 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 Burr Medico and on Chemo (chemo was held last week 2/2 anemia) who presented for abdominal pain.  Patient reports that he has had epigastric and right upper quadrant abdominal pain with associated nausea and diarrhea over the last 3 weeks. He notes that he was started on cipro last week for possible cholangitis. He reports that yesterday he began having increased abdominal pain that was more severe in nature and associated with several episodes of emesis.  He notes his pain is worse with lying flat and palpation.  He presented to the ED for evaluation.  In the ED patient was afebrile, without tachycardia but noted hypotension of 81/51.  He was received 2.5 L bolus.  WBC 58.2.  Lactic acid 2.1.  Creatinine 2.02.  Alk phos 292, AST 35, ALT 24, T bili 5.8. CT w/ gallbladder distension and pericholecystic edema in the setting of gallstones concerning for possible cholecystitis. There was suboptimal evaluation of the pancreatic primary secondary to noncontrast technique. The common duct stent was noted to be in place with pneumobilia. RUQ US showed gallbladder lumen is completely filled with sludge and small stones.  There is irregular wall thickening and pericholecystic fluid.  CBD 4 mm with pneumobilia present. We were asked to see.    ROS: Review of Systems  Constitutional: Negative for chills and fever.  Respiratory: Positive for cough and shortness of breath.   Cardiovascular: Negative for chest pain and leg swelling.  Gastrointestinal: Positive for abdominal pain, diarrhea, nausea and vomiting.  Genitourinary: Negative for dysuria.    Musculoskeletal: Negative for back pain.  Psychiatric/Behavioral: Negative for substance abuse.  All other systems reviewed and are negative.   Family History  Problem Relation Age of Onset   Lymphoma Mother 75       cancer in lymph nodes, unsure of primary   Heart attack Father    Lupus Sister     Past Medical History:  Diagnosis Date   Hypertension     Past Surgical History:  Procedure Laterality Date   BILIARY STENT PLACEMENT  05/11/2019   Procedure: BILIARY STENT PLACEMENT;  Surgeon: Irene Shipper, MD;  Location: [facility] ENDOSCOPY;  Service: Endoscopy;;   BILIARY STENT PLACEMENT  06/03/2019   Procedure: BILIARY STENT PLACEMENT;  Surgeon: Jackquline Denmark, MD;  Location: [facility] ENDOSCOPY;  Service: Endoscopy;;   ERCP N/A 05/11/2019   Procedure: ENDOSCOPIC RETROGRADE CHOLANGIOPANCREATOGRAPHY (ERCP);  Surgeon: Irene Shipper, MD;  Location: [facility] ENDOSCOPY;  Service: Endoscopy;  Laterality: N/A;  with    ERCP N/A 06/03/2019   Procedure: ENDOSCOPIC RETROGRADE CHOLANGIOPANCREATOGRAPHY (ERCP);  Surgeon: Jackquline Denmark, MD;  Location: [facility] ENDOSCOPY;  Service: Endoscopy;  Laterality: N/A;   ESOPHAGOGASTRODUODENOSCOPY (EGD) WITH PROPOFOL N/A 05/19/2019   Procedure: ESOPHAGOGASTRODUODENOSCOPY (EGD) WITH PROPOFOL;  Surgeon: [name], MD;  Location: [facility] ENDOSCOPY;  Service: Endoscopy;  Laterality: N/A;   EUS N/A 05/19/2019   Procedure: UPPER ENDOSCOPIC ULTRASOUND (EUS) LINEAR;  Surgeon: [name], MD;  Location: [facility] ENDOSCOPY;  Service: Endoscopy;  Laterality: N/A;   FINE NEEDLE ASPIRATION N/A 05/19/2019   Procedure: FINE NEEDLE ASPIRATION (FNA) LINEAR;  Surgeon: [name], MD;  Location: [facility] ENDOSCOPY;  Service: Endoscopy;  Laterality: N/A;   IR IMAGING GUIDED PORT INSERTION  06/08/2019   SPHINCTEROTOMY  05/11/2019   Procedure: SPHINCTEROTOMY;  Surgeon: Irene Shipper, MD;  Location: [facility] ENDOSCOPY;  Service: Endoscopy;;   STENT REMOVAL  06/03/2019   Procedure: STENT REMOVAL;  Surgeon:  Jackquline Denmark, MD;  Location: [facility] ENDOSCOPY;  Service: Endoscopy;;    Social History:  reports that he has never smoked. He has never used smokeless tobacco. He reports current alcohol use of about 22.0 standard drinks of alcohol per week. He reports previous drug use.  Allergies: No Known Allergies  (Not in a hospital admission)    Physical Exam: Blood pressure (!) 92/56, pulse 72, temperature (!) 97.5 F (36.4 C), temperature source Rectal, resp. rate (!) 23, height '5\' 11"'  (1.803 m), weight 69.4 kg, SpO2 100 %. General: pleasant, WD/WN AA male who is laying in bed in some discomfort  HEENT: head is normocephalic, atraumatic.  Sclera are noninjected.  PERRL.  Ears and nose without any masses or lesions.  Mouth is pink and moist. Dentition poor Heart: regular, rate, and rhythm.  Normal s1,s2. No obvious murmurs, gallops, or rubs noted.  Palpable radial pulses bilaterally. Port noted on chest wall Lungs: Right lung CTA. Left lung with rales at the base.  Respiratory effort nonlabored Abd: Soft, mild distension, there is epigastric and RUQ abdominal pain with voluntary guarding. +BS, no masses, hernias, or organomegaly MS: no BUE/BLE edema, calves soft and nontender Skin: warm and dry with no masses, lesions, or rashes Psych: A&Ox4 with an appropriate affect Neuro: cranial nerves grossly intact, equal strength in BUE/BLE bilaterally, normal speech, though process intact  Results for orders placed or performed during the hospital encounter of 09/23/19 (from the past 48 hour(s))  SARS Coronavirus 2 by RT PCR (hospital order, performed in [facility] hospital lab) Nasopharyngeal Nasopharyngeal Swab     Status: None   Collection Time: 09/23/19  8:50 AM   Specimen: Nasopharyngeal Swab  Result Value Ref Range   SARS Coronavirus 2 NEGATIVE NEGATIVE    Comment: (NOTE) SARS-CoV-2 target nucleic acids are NOT DETECTED.  The SARS-CoV-2 RNA is generally detectable in upper and lower respiratory  specimens during the acute phase of infection. The lowest concentration of SARS-CoV-2 viral copies this assay can detect is 250 copies / mL. A negative result does not preclude SARS-CoV-2 infection and should not be used as the sole basis for treatment or other patient management decisions.  A negative result may occur with improper specimen collection / handling, submission of specimen other than nasopharyngeal swab, presence of viral mutation(s) within the areas targeted by this assay, and inadequate number of viral copies (<250 copies / mL). A negative result must be combined with clinical observations, patient history, and epidemiological information.  Fact Sheet for Patients:   StrictlyIdeas.no  Fact Sheet for Healthcare Providers: BankingDealers.co.za  This test is not yet approved or  cleared by the [place] FDA and has been authorized for detection and/or diagnosis of SARS-CoV-2 by FDA under an Emergency Use Authorization (EUA).  This EUA will remain in effect (meaning this test can be used) for the duration of the COVID-19 declaration under Section 564(b)(1) of the Act, 21 U.S.C. section 360bbb-3(b)(1), unless the authorization is terminated or revoked sooner.  Performed at [facility], [place] [address] [place], [place] [address]   Type and screen Nice     Status: None   Collection Time: 09/23/19  9:20 AM  Result Value Ref Range   ABO/RH(D) A POS    Antibody Screen NEG    Sample Expiration      09/26/2019,2359 Performed at [facility], [place] [address], [place], [place] [address]   CBC with Differential     Status: Abnormal   Collection Time: 09/23/19  9:30 AM  Result Value Ref Range   WBC 58.2 (HH) 4.0 - 10.5 K/uL    Comment: REPEATED TO VERIFY THIS CRITICAL RESULT HAS VERIFIED AND BEEN CALLED TO BALDWIN,L BY JOLANDE MECIAL ON 08 20 2021 AT 1042, AND  HAS BEEN READ BACK.     RBC 3.19 (L) 4.22 - 5.81 MIL/uL   Hemoglobin 8.6 (L) 13.0 - 17.0 g/dL   HCT 26.3 (L) 39 - 52 %   MCV 82.4 80.0 - 100.0 fL   MCH 27.0 26.0 - 34.0 pg   MCHC 32.7 30.0 - 36.0 g/dL   RDW 16.3 (H) 11.5 - 15.5 %   Platelets 172 150 - 400 K/uL   nRBC 0.0 0.0 - 0.2 %   Neutrophils Relative % 89 %   Neutro Abs 52.2 (H) 1.7 - 7.7 K/uL   Lymphocytes Relative 1 %   Lymphs Abs 0.4 (L) 0.7 - 4.0 K/uL   Monocytes Relative 4 %   Monocytes Absolute 2.0 (H) 0 - 1 K/uL   Eosinophils Relative 0 %   Eosinophils Absolute 0.1 0 - 0 K/uL   Basophils Relative 0 %   Basophils Absolute 0.2 (H) 0 - 0 K/uL   WBC Morphology MORPHOLOGY UNREMARKABLE    Immature Granulocytes 6 %   Abs Immature Granulocytes 3.36 (H) 0.00 - 0.07 K/uL    Comment: Performed at [facility], [place] [address], [place], [place] [address]  Basic metabolic panel     Status: Abnormal   Collection Time: 09/23/19  9:30 AM  Result Value Ref Range   Sodium 133 (L) 135 - 145 mmol/L   Potassium 3.5 3.5 - 5.1 mmol/L   Chloride 100 98 - 111 mmol/L   CO2 19 (L) 22 - 32 mmol/L   Glucose, Bld 145 (H) 70 - 99 mg/dL    Comment: Glucose reference range applies only to samples taken after fasting for at least 8 hours.   BUN 17 8 - 23 mg/dL   Creatinine, Ser 2.02 (H) 0.61 - 1.24 mg/dL   Calcium 8.6 (L) 8.9 - 10.3 mg/dL   GFR calc non Af Amer 34 (L) >60 mL/min   GFR calc Af Amer 39 (L) >60 mL/min   Anion gap 14 5 - 15    Comment: Performed at [facility], [place] [address], [place], [place] [address]  Hepatic function panel     Status: Abnormal   Collection Time: 09/23/19  9:30 AM  Result Value Ref Range   Total Protein 6.6 6.5 - 8.1 g/dL   Albumin 2.6 (L) 3.5 - 5.0 g/dL   AST 35 15 - 41 U/L   ALT 24 0 - 44 U/L   Alkaline Phosphatase 292 (H) 38 - 126 U/L   Total Bilirubin 5.8 (H) 0.3 - 1.2 mg/dL   Bilirubin, Direct 3.9 (H) 0.0 - 0.2 mg/dL   Indirect Bilirubin 1.9 (H) 0.3 - 0.9 mg/dL      Comment: Performed at [facility], [place] [address] [place],  [address]  Lipase, blood     Status: None   Collection Time: 09/23/19  9:30 AM  Result Value Ref Range  Lipase 20 11 - 51 U/L    Comment: Performed at [facility], [place] [address] [place], [place] [address]  Protime-INR     Status: Abnormal   Collection Time: 09/23/19  9:30 AM  Result Value Ref Range   Prothrombin Time 16.4 (H) 11.4 - 15.2 seconds   INR 1.4 (H) 0.8 - 1.2    Comment: (NOTE) INR goal varies based on device and disease states. Performed at [facility], [place] [address] [place], [place] [address]   Lactic acid, plasma     Status: Abnormal   Collection Time: 09/23/19  9:36 AM  Result Value Ref Range   Lactic Acid, Venous 2.1 (HH) 0.5 - 1.9 mmol/L    Comment: CRITICAL RESULT CALLED TO, READ BACK BY AND VERIFIED WITH: BALDWIN,L RN '@10' :31 09/23/19 BY BOWMAN,K Performed at [facility], [place] [address], [place], [place] [address]    CT ABDOMEN PELVIS WO CONTRAST  Result Date: 09/23/2019 CLINICAL DATA:  Nausea and vomiting. Weakness and hypertension for 1 week. History of pancreatic cancer. EXAM: CT ABDOMEN AND PELVIS WITHOUT CONTRAST TECHNIQUE: Multidetector CT imaging of the abdomen and pelvis was performed following the standard protocol without IV contrast. COMPARISON:  08/10/2019 MRI.  CT of 05/13/2019 FINDINGS: Lower chest: Clear lung bases. Normal heart size without pericardial or pleural effusion. Hepatobiliary: Limited evaluation for focal liver lesion. Common duct stent in place, with pneumobilia suggesting stent patency. Gallstones. Mild gallbladder distension with pericholecystic edema, including on 34/3. Pancreas: The pancreatic primary is poorly evaluated on this noncontrast exam. No gross pancreatic duct dilatation. No convincing evidence of pancreatitis. Spleen: Normal in size, without focal abnormality.  Adrenals/Urinary Tract: Normal adrenal glands. No renal calculi or hydronephrosis. 2.0 cm interpolar left renal low-density lesion is likely a cyst. Mild right renal cortical thinning. No renal calculi or hydronephrosis. No bladder calculi. Stomach/Bowel: Normal stomach, without wall thickening. Scattered colonic diverticula. Normal terminal ileum and appendix. Normal small bowel. Vascular/Lymphatic: No gross arterial encasement. No abdominopelvic adenopathy. An 11 mm upper normal portal caval node is similar to 12 mm on the prior. 21/3 today. Reproductive: Normal prostate. Other: No significant free fluid. No evidence of omental or peritoneal disease. Musculoskeletal: Prior trauma or surgery involving right iliac crest. Mild wedging of the T12 vertebral body, chronic. IMPRESSION: 1. Findings highly suspicious for acute cholecystitis. Gallbladder distension and pericholecystic edema in the setting of gallstones. Depending on clinical concern, ultrasound could confirm. 2. Suboptimal evaluation of the pancreatic primary secondary to noncontrast technique. Common duct stent in place with pneumobilia. 3.  Aortic Atherosclerosis (ICD10-I70.0). Electronically Signed   By: Abigail Miyamoto M.D.   On: 09/23/2019 11:05   US Abdomen Limited RUQ  Result Date: 09/23/2019 CLINICAL DATA:  Nausea and vomiting. History of pancreatic cancer and biliary stent EXAM: ULTRASOUND ABDOMEN LIMITED RIGHT UPPER QUADRANT COMPARISON:  CT 09/23/2019 FINDINGS: Gallbladder: Gallbladder lumen is completely filled with heterogeneous sludge and small shadowing stones. Irregular wall thickening with associated pericholecystic fluid. No sonographic Murphy sign noted by sonographer. Common bile duct: Diameter: 4 mm.  Pneumobilia noted. Liver: Reverberation artifact within the intrahepatic biliary ducts compatible with pneumobilia. No focal lesion identified. Within normal limits in parenchymal echogenicity. Portal vein is patent on color Doppler  imaging with normal direction of blood flow towards the liver. Other: None. IMPRESSION: 1. Abnormal appearance of the gallbladder which is distended by sludge and small stones. Irregular gallbladder wall thickening and pericholecystic edema. Findings are suspicious for acute cholecystitis. 2.  Pneumobilia. Electronically Signed   By: Davina Poke D.O.   On: 09/23/2019 11:43    Anti-infectives (From admission, onward)   Start     Dose/Rate Route Frequency Ordered Stop   09/23/19 1300  piperacillin-tazobactam (ZOSYN) IVPB 3.375 g        3.375 g 12.5 mL/hr over 240 Minutes Intravenous Every 8 hours 09/23/19 1246     09/23/19 1030  piperacillin-tazobactam (ZOSYN) IVPB 3.375 g        3.375 g 100 mL/hr over 30 Minutes Intravenous  Once 09/23/19 1030 09/23/19 1144      Assessment/Plan HTN Prior alcohol abuse  Pancreatic adenocarcinoma w/ a malignant biliary stricture - diagnosed 05/2019 s/p bilary stent placement currently followed by Dr. Burr Medico and on Chemo (chemo was held last week 2/2 anemia)  RUQ abdominal pain w/ possible acute cholecystitis  - Recommend GI evaluation to determine if patient requires biliary stent exchange.  - Continue abx - If patient continues to have findings suspicious for acute cholecystitis after stent evaluation confirms there is no biliary obstruction, would recommend HIDA for confirmation and IR perc chole drainage. I do not think he is a good surgical candidate during this admission.  - We will continue to follow along with you   FEN - NPO, IVF VTE - SCDs ID - [email or web address]   Jillyn Ledger, [facility] 09/23/2019, 12:17 PM Please see Amion for pager number during day hours 7:00am-4:30pm

## 2019-09-23 NOTE — ED Notes (Signed)
Pt is still not able to provide urine sample at this time.

## 2019-09-23 NOTE — ED Provider Notes (Addendum)
[facility] DEPT Provider Note   CSN: [id number] Arrival date & time: 09/23/19  1517     History Chief Complaint  Patient presents with  . Nausea  . Emesis  . Diarrhea    Isaac Doyle is a 64 y.o. male.  Patient with history of pancreatic cancer undergoing chemotherapy with recent admission for symptomatic anemia presents to the ED with ongoing diarrhea, nausea, vomiting. Chemotherapy was stopped this past week due to recent blood transfusion needs. Was started on ciprofloxacin for concern for possible cholangitis. Patient returns with ongoing nausea vomiting diarrhea. Having ongoing epigastric abdominal pain. Not able to keep things down. He states that his stool is green. Denies any fever or chills.  The history is provided by the patient.       Past Medical History:  Diagnosis Date  . Hypertension     Patient Active Problem List   Diagnosis Date Noted  . Port-A-Cath in place 08/17/2019  . Symptomatic anemia 08/08/2019  . Biliary obstruction   . Malignant obstructive jaundice ([place]) 06/03/2019  . Malignant neoplasm of pancreas ([place])   . Biliary stricture   . ARF (acute renal failure) ([place]) 05/10/2019  . Essential hypertension 05/10/2019  . Hypokalemia 05/10/2019  . Normochromic normocytic anemia 05/10/2019  . Malignant biliary obstruction ([place])   . Jaundice 05/09/2019    Past Surgical History:  Procedure Laterality Date  . BILIARY STENT PLACEMENT  05/11/2019   Procedure: BILIARY STENT PLACEMENT;  Surgeon: Irene Shipper, MD;  Location: [facility] ENDOSCOPY;  Service: Endoscopy;;  . BILIARY STENT PLACEMENT  06/03/2019   Procedure: BILIARY STENT PLACEMENT;  Surgeon: Jackquline Denmark, MD;  Location: [facility] ENDOSCOPY;  Service: Endoscopy;;  . ERCP N/A 05/11/2019   Procedure: ENDOSCOPIC RETROGRADE CHOLANGIOPANCREATOGRAPHY (ERCP);  Surgeon: Irene Shipper, MD;  Location: [facility] ENDOSCOPY;  Service: Endoscopy;  Laterality: N/A;  with   . ERCP N/A 06/03/2019    Procedure: ENDOSCOPIC RETROGRADE CHOLANGIOPANCREATOGRAPHY (ERCP);  Surgeon: Jackquline Denmark, MD;  Location: [facility] ENDOSCOPY;  Service: Endoscopy;  Laterality: N/A;  . ESOPHAGOGASTRODUODENOSCOPY (EGD) WITH PROPOFOL N/A 05/19/2019   Procedure: ESOPHAGOGASTRODUODENOSCOPY (EGD) WITH PROPOFOL;  Surgeon: [name], MD;  Location: [facility] ENDOSCOPY;  Service: Endoscopy;  Laterality: N/A;  . EUS N/A 05/19/2019   Procedure: UPPER ENDOSCOPIC ULTRASOUND (EUS) LINEAR;  Surgeon: [name], MD;  Location: [facility] ENDOSCOPY;  Service: Endoscopy;  Laterality: N/A;  . FINE NEEDLE ASPIRATION N/A 05/19/2019   Procedure: FINE NEEDLE ASPIRATION (FNA) LINEAR;  Surgeon: [name], MD;  Location: [facility] ENDOSCOPY;  Service: Endoscopy;  Laterality: N/A;  . IR IMAGING GUIDED PORT INSERTION  06/08/2019  . SPHINCTEROTOMY  05/11/2019   Procedure: SPHINCTEROTOMY;  Surgeon: Irene Shipper, MD;  Location: [facility] ENDOSCOPY;  Service: Endoscopy;;  . [name] REMOVAL  06/03/2019   Procedure: STENT REMOVAL;  Surgeon: Jackquline Denmark, MD;  Location: [facility] ENDOSCOPY;  Service: Endoscopy;;       Family History  Problem Relation Age of Onset  . Lymphoma Mother 30       cancer in lymph nodes, unsure of primary  . Heart attack Father   . Lupus Sister     Social History   Tobacco Use  . Smoking status: Never Smoker  . Smokeless tobacco: Never Used  . Tobacco comment: plan to quit completely   Substance Use Topics  . Alcohol use: Yes    Alcohol/week: 22.0 standard drinks    Types: 12 Cans of beer, 10 Shots of liquor per week  .  Drug use: Not Currently    Home Medications Prior to Admission medications   Medication Sig Start Date End Date Taking? Authorizing Provider  atenolol (TENORMIN) 100 MG tablet Take 1 tablet (100 mg total) by mouth daily. 05/14/19  Yes Vann, Jessica U, DO  cetirizine (ZYRTEC) 10 MG tablet Take 10 mg by mouth daily.   Yes [provider]  ciprofloxacin (CIPRO) 500 MG tablet Take 1 tablet (500 mg total) by  mouth 2 (two) times daily. 09/19/19  Yes Alla Feeling, NP  diphenoxylate-atropine (LOMOTIL) 2.5-0.025 MG tablet Take 1-2 tablets by mouth 4 (four) times daily as needed for diarrhea or loose stools. Patient taking differently: Take 2 tablets by mouth 4 (four) times daily as needed for diarrhea or loose stools.  07/14/19  Yes Truitt Merle, MD  guaifenesin (HUMIBID E) 400 MG TABS tablet Take 1 tablet (400 mg total) by mouth every 6 (six) hours as needed. Patient taking differently: Take 400 mg by mouth in the morning and at bedtime.  05/13/19  Yes Vann, Jessica U, DO  ibuprofen (ADVIL) 200 MG tablet Take 200 mg by mouth every 6 (six) hours as needed for headache.   Yes [provider]  lidocaine-prilocaine (EMLA) cream Apply 1 application topically as needed. Patient taking differently: Apply 1 application topically as needed (To access the port).  06/23/19  Yes Truitt Merle, MD  loperamide (IMODIUM) 2 MG capsule Take 1-2 capsules (2-4 mg total) by mouth every 6 (six) hours as needed for diarrhea or loose stools. 07/14/19  Yes Truitt Merle, MD  magnesium oxide (MAG-OX) 400 (241.3 Mg) MG tablet Take 1 tablet (400 mg total) by mouth daily. Patient taking differently: Take 400 mg by mouth every other day.  05/14/19  Yes Vann, Jessica U, DO  pantoprazole (PROTONIX) 40 MG tablet Take 1 tablet (40 mg total) by mouth daily. 08/10/19 09/23/19 Yes Arrien, Jimmy Picket, MD  potassium chloride SA (KLOR-CON) 20 MEQ tablet Take 2 tablets (40 mEq total) by mouth daily. 08/30/19  Yes Truitt Merle, MD  sulindac (CLINORIL) 200 MG tablet Take 400 mg by mouth 2 (two) times daily.   Yes [provider]    Allergies    Patient has no known allergies.  Review of Systems   Review of Systems  Constitutional: Negative for chills and fever.  HENT: Negative for ear pain and sore throat.   Eyes: Negative for pain and visual disturbance.  Respiratory: Negative for cough and shortness of breath.   Cardiovascular: Negative  for chest pain and palpitations.  Gastrointestinal: Positive for diarrhea, nausea and vomiting. Negative for abdominal pain.  Genitourinary: Negative for dysuria and hematuria.  Musculoskeletal: Negative for arthralgias and back pain.  Skin: Negative for color change and rash.  Neurological: Negative for seizures and syncope.  All other systems reviewed and are negative.   Physical Exam Updated Vital Signs  ED Triage Vitals  Enc Vitals Group     BP 09/23/19 0848 (!) 81/51     Pulse Rate 09/23/19 0848 73     Resp 09/23/19 0848 16     Temp 09/23/19 0908 (!) 97.5 F (36.4 C)     Temp Source 09/23/19 0908 Rectal     SpO2 09/23/19 0848 99 %     Weight 09/23/19 0849 153 lb (69.4 kg)     Height 09/23/19 0849 5\' 11"  (1.803 m)     Head Circumference --      Peak Flow --      Pain  Score 09/23/19 0848 7     Pain Loc --      Pain Edu? --      Excl. in [place]? --     Physical Exam Vitals and nursing note reviewed.  Constitutional:      General: He is not in acute distress.    Appearance: He is well-developed and underweight. He is not ill-appearing.  HENT:     Head: Normocephalic and atraumatic.     Mouth/Throat:     Mouth: Mucous membranes are dry.  Eyes:     Extraocular Movements: Extraocular movements intact.     Conjunctiva/sclera: Conjunctivae normal.     Pupils: Pupils are equal, round, and reactive to light.  Cardiovascular:     Rate and Rhythm: Normal rate and regular rhythm.     Pulses: Normal pulses.     Heart sounds: Normal heart sounds. No murmur heard.   Pulmonary:     Effort: Pulmonary effort is normal. No respiratory distress.     Breath sounds: Normal breath sounds.  Abdominal:     Palpations: Abdomen is soft.     Tenderness: There is abdominal tenderness (RUQ/epigastric). There is no rebound.  Genitourinary:    Rectum: Guaiac result negative (brown stool on exam).  Musculoskeletal:     Cervical back: Normal range of motion and neck supple.  Skin:     General: Skin is warm and dry.     Capillary Refill: Capillary refill takes less than 2 seconds.  Neurological:     General: No focal deficit present.     Mental Status: He is alert.     ED Results / Procedures / Treatments   Labs (all labs ordered are listed, but only abnormal results are displayed) Labs Reviewed  CBC WITH DIFFERENTIAL/PLATELET - Abnormal; Notable for the following components:      Result Value   WBC 58.2 (*)    RBC 3.19 (*)    Hemoglobin 8.6 (*)    HCT 26.3 (*)    RDW 16.3 (*)    Neutro Abs 52.2 (*)    Lymphs Abs 0.4 (*)    Monocytes Absolute 2.0 (*)    Basophils Absolute 0.2 (*)    Abs Immature Granulocytes 3.36 (*)    All other components within normal limits  BASIC METABOLIC PANEL - Abnormal; Notable for the following components:   Sodium 133 (*)    CO2 19 (*)    Glucose, Bld 145 (*)    Creatinine, Ser 2.02 (*)    Calcium 8.6 (*)    GFR calc non Af Amer 34 (*)    GFR calc Af Amer 39 (*)    All other components within normal limits  HEPATIC FUNCTION PANEL - Abnormal; Notable for the following components:   Albumin 2.6 (*)    Alkaline Phosphatase 292 (*)    Total Bilirubin 5.8 (*)    Bilirubin, Direct 3.9 (*)    Indirect Bilirubin 1.9 (*)    All other components within normal limits  PROTIME-INR - Abnormal; Notable for the following components:   Prothrombin Time 16.4 (*)    INR 1.4 (*)    All other components within normal limits  LACTIC ACID, PLASMA - Abnormal; Notable for the following components:   Lactic Acid, Venous 2.1 (*)    All other components within normal limits  URINE CULTURE  CULTURE, BLOOD (ROUTINE X 2)  CULTURE, BLOOD (ROUTINE X 2)  SARS CORONAVIRUS 2 BY RT PCR (HOSPITAL ORDER, PERFORMED IN [facility])  C DIFFICILE QUICK SCREEN W PCR REFLEX  GASTROINTESTINAL PANEL BY PCR, STOOL (REPLACES STOOL CULTURE)  LIPASE, BLOOD  URINALYSIS, ROUTINE W REFLEX MICROSCOPIC  LACTIC ACID, PLASMA  TYPE AND SCREEN     EKG None  Radiology CT ABDOMEN PELVIS WO CONTRAST  Result Date: 09/23/2019 CLINICAL DATA:  Nausea and vomiting. Weakness and hypertension for 1 week. History of pancreatic cancer. EXAM: CT ABDOMEN AND PELVIS WITHOUT CONTRAST TECHNIQUE: Multidetector CT imaging of the abdomen and pelvis was performed following the standard protocol without IV contrast. COMPARISON:  08/10/2019 MRI.  CT of 05/13/2019 FINDINGS: Lower chest: Clear lung bases. Normal heart size without pericardial or pleural effusion. Hepatobiliary: Limited evaluation for focal liver lesion. Common duct stent in place, with pneumobilia suggesting stent patency. Gallstones. Mild gallbladder distension with pericholecystic edema, including on 34/3. Pancreas: The pancreatic primary is poorly evaluated on this noncontrast exam. No gross pancreatic duct dilatation. No convincing evidence of pancreatitis. Spleen: Normal in size, without focal abnormality. Adrenals/Urinary Tract: Normal adrenal glands. No renal calculi or hydronephrosis. 2.0 cm interpolar left renal low-density lesion is likely a cyst. Mild right renal cortical thinning. No renal calculi or hydronephrosis. No bladder calculi. Stomach/Bowel: Normal stomach, without wall thickening. Scattered colonic diverticula. Normal terminal ileum and appendix. Normal small bowel. Vascular/Lymphatic: No gross arterial encasement. No abdominopelvic adenopathy. An 11 mm upper normal portal caval node is similar to 12 mm on the prior. 21/3 today. Reproductive: Normal prostate. Other: No significant free fluid. No evidence of omental or peritoneal disease. Musculoskeletal: Prior trauma or surgery involving right iliac crest. Mild wedging of the T12 vertebral body, chronic. IMPRESSION: 1. Findings highly suspicious for acute cholecystitis. Gallbladder distension and pericholecystic edema in the setting of gallstones. Depending on clinical concern, ultrasound could confirm. 2. Suboptimal evaluation of  the pancreatic primary secondary to noncontrast technique. Common duct stent in place with pneumobilia. 3.  Aortic Atherosclerosis (ICD10-I70.0). Electronically Signed   By: Abigail Miyamoto M.D.   On: 09/23/2019 11:05   US Abdomen Limited RUQ  Result Date: 09/23/2019 CLINICAL DATA:  Nausea and vomiting. History of pancreatic cancer and biliary stent EXAM: ULTRASOUND ABDOMEN LIMITED RIGHT UPPER QUADRANT COMPARISON:  CT 09/23/2019 FINDINGS: Gallbladder: Gallbladder lumen is completely filled with heterogeneous sludge and small shadowing stones. Irregular wall thickening with associated pericholecystic fluid. No sonographic Murphy sign noted by sonographer. Common bile duct: Diameter: 4 mm.  Pneumobilia noted. Liver: Reverberation artifact within the intrahepatic biliary ducts compatible with pneumobilia. No focal lesion identified. Within normal limits in parenchymal echogenicity. Portal vein is patent on color Doppler imaging with normal direction of blood flow towards the liver. Other: None. IMPRESSION: 1. Abnormal appearance of the gallbladder which is distended by sludge and small stones. Irregular gallbladder wall thickening and pericholecystic edema. Findings are suspicious for acute cholecystitis. 2. Pneumobilia. Electronically Signed   By: Davina Poke D.O.   On: 09/23/2019 11:43    Procedures .Critical Care Performed by: Lennice Sites, DO Authorized by: Lennice Sites, DO   Critical care provider statement:    Critical care time (minutes):  45   Critical care was necessary to treat or prevent imminent or life-threatening deterioration of the following conditions:  Dehydration   Critical care was time spent personally by me on the following activities:  Discussions with consultants, evaluation of patient's response to treatment, examination of patient, ordering and performing treatments and interventions, ordering and review of laboratory studies, ordering and review of radiographic studies,  pulse oximetry, re-evaluation of  patient's condition, obtaining history from patient or surrogate and review of old charts   (including critical care time)  Medications Ordered in ED Medications  sodium chloride (PF) 0.9 % injection (  Not Given 09/23/19 0932)  0.9 %  sodium chloride infusion (has no administration in time range)  fentaNYL (SUBLIMAZE) injection 50 mcg (has no administration in time range)  prochlorperazine (COMPAZINE) injection 10 mg (10 mg Intravenous Given 09/23/19 0929)  sodium chloride 0.9 % bolus 2,500 mL (0 mLs Intravenous Stopped 09/23/19 1144)  piperacillin-tazobactam (ZOSYN) IVPB 3.375 g (0 g Intravenous Stopped 09/23/19 1144)    ED Course  I have reviewed the triage vital signs and the nursing notes.  Pertinent labs & imaging results that were available during my care of the patient were reviewed by me and considered in my medical decision making (see chart for details).    MDM Rules/Calculators/A&P                          Deja Kaigler is a 64 year old male with history of pancreatic cancer undergoing chemotherapy with recent need for blood transfusions from symptomatic anemia who presents to the ED with nausea, vomiting, diarrhea.  Blood pressure is 81/51.  Otherwise vitals are unremarkable.  Patient was put on ciprofloxacin several days ago as chemotherapy was held.  Bilirubin was increasing and they were concerned for possible cholangitis.  However patient overall appears well.  Does not have a fever temperature is a little bit low at 97.5.  He is not tachycardic and have low concern for sepsis at this time however will pursue infectious work-up.  Stool is grossly brown on exam.  Will add C. difficile, GI stool pathogen panel.  He has had a lot of nausea and vomiting diarrhea for the last several days.  Suspect that hypotension may be volume related as mucous membranes are dry, urine is decreased.  Will hold antibiotics at this time. RUQ/epigastric ab pain on  exam.   CT scan concerning for acute cholecystitis which was confirmed on ultrasound of the right upper quadrant.  On CT scan, bile duct stent appears to be patent.  Patient does have a white count of 58 and a lactic acid of 2.1.  Suspect that white count elevated as he also was given stimulating factor recently.  Does have an AKI with a creatinine of 2.  Bilirubin is 5.8.  However liver enzymes and lipase are normal.  This probably also suggest that common bile duct stent is pain.  Both general surgery and GI have been consulted.  At this time surgery likely to recommend the perc chole drain.  Blood pressures have stabilized in the upper 90s-100s.  Will admit to medicine for further care.    This chart was dictated using voice recognition software.  Despite best efforts to proofread,  errors can occur which can change the documentation meaning.    Final Clinical Impression(s) / ED Diagnoses Final diagnoses:  Hyperbilirubinemia  Acute cholecystitis  Sepsis, due to unspecified organism, unspecified whether acute organ dysfunction present [facility])    Rx / DC Orders ED Discharge Orders    None       Lennice Sites, DO 09/23/19 [place], [place], DO 09/23/19 1207

## 2019-09-23 NOTE — ED Triage Notes (Signed)
Pt came EMS from home for N/V/D, weakness and Hypotension x1 wk. Hx of Pancreatic Cancer.

## 2019-09-23 NOTE — H&P (View-Only) (Signed)
Referring Provider: No ref. provider found Primary Care Physician:  System, Pcp Not In Primary Gastroenterologist:  Dr. Havery Moros   Reason for Consultation:  Pancreatic cancer malignant biliary stricture with stent in place  HPI: Isaac Doyle is a 64 y.o. male history of hypertension.  He was diagnosed with pancreatic cancer with a malignant biliary stricture April 2021 by oncologist Dr. Burr Medico.  He presented to the hospital 05/09/2019 jaundice and elevated LFTs. Total bili 48.4.  AST 183.  ALT 176.  Alk phos 660.  WBC 10.9.  Hemoglobin 11.5.  An abdominal ultrasound showed a distended gallbladder with multiple stones and sludge with common bile duct and intrahepatic ductal dilatation. A MRCP showed biliary obstruction from a likely pancreatic malignancy. He underwent an ERCP 05/11/2019 by Dr. Henrene Pastor which identified a malignant distal bile duct stricture status post ERCP with biliary stent placement.  His LFTs drifted downward and he was discharged home on 05/13/2019. An outpatient EUS was done 05/19/2019 which identified a large pancreatic mass encasing the PV and SMA.  Pathology reports were positive for adenocarcinoma.  The previously placed plastic biliary stent was in the CBD in appropriate position.   He was readmitted to the hospital 06/03/2019 with obstructive jaundice.  A repeat ERCP was done 4/30 which showed the previously plastic stent had occluded and migrated partially into the duodenum.  The stent was removed.  The main bile duct was moderately dilated secondary to a distal malignant appearing 2 cm stricture, the right and left hepatic ducts were moderately dilated.  A 10 French by 6 cm covered metal stent was placed 5 cm into the common bile duct.  The stent was in good position post procedure.  LFTs did downward and he was discharged home on 06/04/2019.  He is followed by oncologist Dr. Annamaria Boots.  Chemotherapy with FOLFIRINOX every 2 weeks with starting 06/16/2019.  He was readmitted to the  hospital on 08/08/2019 due to having symptomatic anemia in the setting of pancreatic cancer with ongoing chemotherapy.  His hemoglobin level was 9.0. (Hg 8.2 on 08/04/2019).  Iron 22.  TIBC 182.  He received 2 units of packed red blood cells.  His alk phos level was 161.  AST 36.  ALT 34.  Total bili 2.7.  MRI/MRCP was done which showed multiple dependent gallstones, mild gallbladder wall thickening severely degenerated images due to motion artifact poor definition of the common hepatic duct and common bile duct but no intrahepatic biliary dilatation and therefore stent felt to be patent. His hemoglobin was 8.9 at the time of discharge on 7/7.  He was started on Pantoprazole 40 mg daily for GI prophylaxis.  He presented to [facility] emergency room today due to having nausea, vomiting x 2 (emesis consisted of partially undigested food, nonbloody emesis) and nonbloody diarrhea which started yesterday.  His chemotherapy was stopped last week due to worsening anemia which required a blood transfusion.  He was started on Ciprofloxacin 7 days ago due to having increased right upper quadrant pain concerning for possible cholangitis.  His abdominal pain worsened and he was advised to present to the ED for further evaluation.  He was hypertensive in the emergency room with SBP 90/100s. He received IV fluid his BP has stabilized. He is tachycardic. He is afebrile. General surgery was consulted for consideration of a percutaneous chole drain.  GI consult was requested to review the patency of the sustained biliary stent.   Labs 09/23/2019: Sodium 133.  Potassium 3.5.  BUN  17.  Creatinine 2.02.  Alk phos 292.  Lipase 20.  AST 35.  ALT 24.  Total bili 5.8.  Direct bili 3.9.  Lactic acid 2.1.  WBC 58.2.  Hemoglobin 8.6.  Hematocrit 26.3.  MCV 27.0.  Platelet 172.  INR 1.4.  Is coronavirus 2 negative.   Chest x-ray: No active disease  Right upper quadrant ultrasound 09/23/2019: 1. Abnormal appearance of the  gallbladder which is distended by sludge and small stones. Irregular gallbladder wall thickening and pericholecystic edema. Findings are suspicious for acute cholecystitis. 2. Pneumobilia.  Abdominal/pelvic CT with out contrast 09/23/2019: 1. Findings highly suspicious for acute cholecystitis. Gallbladder distension and pericholecystic edema in the setting of gallstones. Depending on clinical concern, ultrasound could confirm. 2. Suboptimal evaluation of the pancreatic primary secondary to noncontrast technique. Common duct stent in place with pneumobilia. 3.  Aortic Atherosclerosis    ERCP 06/03/2019 by Dr. 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  EUS 05/19/2019: - Large mass involving much of the pancreatic parenchyma, clear encasing the PV and possibly involving the SMA as well. The mass was sampled with 3 transduodenal EUS FNB passes and the preliminary cytology was positive for malignancy (adenocarcinoma). - Previously placed plastic biliary stent was in the CBD in good position.  ERCP 05/11/2019 by Dr. Henrene Pastor:  1. Malignant appearing distal bile duct stricture with upstream dilation. Status post ERCP with sphincterotomy and biliary stent placement   Past Medical History:  Diagnosis Date  . Hypertension     Past Surgical History:  Procedure Laterality Date  . BILIARY STENT PLACEMENT  05/11/2019   Procedure: BILIARY STENT PLACEMENT;  Surgeon: Irene Shipper, MD;  Location: [facility] ENDOSCOPY;  Service: Endoscopy;;  . BILIARY STENT PLACEMENT  06/03/2019   Procedure: BILIARY STENT PLACEMENT;  Surgeon: Jackquline Denmark, MD;  Location: [facility] ENDOSCOPY;  Service: Endoscopy;;  . ERCP N/A 05/11/2019   Procedure: ENDOSCOPIC RETROGRADE CHOLANGIOPANCREATOGRAPHY (ERCP);  Surgeon: Irene Shipper, MD;  Location[facility] ENDOSCOPY;  Service: Endoscopy;  Laterality: N/A;  with   . ERCP N/A 06/03/2019   Procedure: ENDOSCOPIC RETROGRADE CHOLANGIOPANCREATOGRAPHY (ERCP);  Surgeon:  Jackquline Denmark, MD;  Location: [facility] ENDOSCOPY;  Service: Endoscopy;  Laterality: N/A;  . ESOPHAGOGASTRODUODENOSCOPY (EGD) WITH PROPOFOL N/A 05/19/2019   Procedure: ESOPHAGOGASTRODUODENOSCOPY (EGD) WITH PROPOFOL;  Surgeon: [name], MD;  Location: [facility] ENDOSCOPY;  Service: Endoscopy;  Laterality: N/A;  . EUS N/A 05/19/2019   Procedure: UPPER ENDOSCOPIC ULTRASOUND (EUS) LINEAR;  Surgeon: [name], MD;  Location: [facility] ENDOSCOPY;  Service: Endoscopy;  Laterality: N/A;  . FINE NEEDLE ASPIRATION N/A 05/19/2019   Procedure: FINE NEEDLE ASPIRATION (FNA) LINEAR;  Surgeon: [name], MD;  Location: [facility] ENDOSCOPY;  Service: Endoscopy;  Laterality: N/A;  . IR IMAGING GUIDED PORT INSERTION  06/08/2019  . SPHINCTEROTOMY  05/11/2019   Procedure: SPHINCTEROTOMY;  Surgeon: Irene Shipper, MD;  Location: [facility] ENDOSCOPY;  Service: Endoscopy;;  . [name] REMOVAL  06/03/2019   Procedure: STENT REMOVAL;  Surgeon: Jackquline Denmark, MD;  Location: [facility] ENDOSCOPY;  Service: Endoscopy;;    Prior to Admission medications   Medication Sig Start Date End Date Taking? Authorizing Provider  atenolol (TENORMIN) 100 MG tablet Take 1 tablet (100 mg total) by mouth daily. 05/14/19  Yes Vann, Jessica U, DO  cetirizine (ZYRTEC) 10 MG tablet Take 10 mg by mouth daily.   Yes [provider]  ciprofloxacin (CIPRO) 500 MG tablet Take 1 tablet (500 mg total) by mouth 2 (two) times daily.  09/19/19  Yes Alla Feeling, NP  diphenoxylate-atropine (LOMOTIL) 2.5-0.025 MG tablet Take 1-2 tablets by mouth 4 (four) times daily as needed for diarrhea or loose stools. Patient taking differently: Take 2 tablets by mouth 4 (four) times daily as needed for diarrhea or loose stools.  07/14/19  Yes Truitt Merle, MD  guaifenesin (HUMIBID E) 400 MG TABS tablet Take 1 tablet (400 mg total) by mouth every 6 (six) hours as needed. Patient taking differently: Take 400 mg by mouth in the morning and at bedtime.  05/13/19  Yes Vann, Jessica U, DO  ibuprofen  (ADVIL) 200 MG tablet Take 200 mg by mouth every 6 (six) hours as needed for headache.   Yes [provider]  lidocaine-prilocaine (EMLA) cream Apply 1 application topically as needed. Patient taking differently: Apply 1 application topically as needed (To access the port).  06/23/19  Yes Truitt Merle, MD  loperamide (IMODIUM) 2 MG capsule Take 1-2 capsules (2-4 mg total) by mouth every 6 (six) hours as needed for diarrhea or loose stools. 07/14/19  Yes Truitt Merle, MD  magnesium oxide (MAG-OX) 400 (241.3 Mg) MG tablet Take 1 tablet (400 mg total) by mouth daily. Patient taking differently: Take 400 mg by mouth every other day.  05/14/19  Yes Vann, Jessica U, DO  pantoprazole (PROTONIX) 40 MG tablet Take 1 tablet (40 mg total) by mouth daily. 08/10/19 09/23/19 Yes Arrien, Jimmy Picket, MD  potassium chloride SA (KLOR-CON) 20 MEQ tablet Take 2 tablets (40 mEq total) by mouth daily. 08/30/19  Yes Truitt Merle, MD  sulindac (CLINORIL) 200 MG tablet Take 400 mg by mouth 2 (two) times daily.   Yes [provider]    Current Facility-Administered Medications  Medication Dose Route Frequency Provider Last Rate Last Admin  . 0.9 %  sodium chloride infusion   Intravenous Once Curatolo, Adam, DO      . fentaNYL (SUBLIMAZE) injection 50 mcg  50 mcg Intravenous Once Curatolo, Adam, DO      . sodium chloride (PF) 0.9 % injection            Current Outpatient Medications  Medication Sig Dispense Refill  . atenolol (TENORMIN) 100 MG tablet Take 1 tablet (100 mg total) by mouth daily. 30 tablet 0  . cetirizine (ZYRTEC) 10 MG tablet Take 10 mg by mouth daily.    . ciprofloxacin (CIPRO) 500 MG tablet Take 1 tablet (500 mg total) by mouth 2 (two) times daily. 14 tablet 0  . diphenoxylate-atropine (LOMOTIL) 2.5-0.025 MG tablet Take 1-2 tablets by mouth 4 (four) times daily as needed for diarrhea or loose stools. (Patient taking differently: Take 2 tablets by mouth 4 (four) times daily as needed for diarrhea  or loose stools. ) 90 tablet 0  . guaifenesin (HUMIBID E) 400 MG TABS tablet Take 1 tablet (400 mg total) by mouth every 6 (six) hours as needed. (Patient taking differently: Take 400 mg by mouth in the morning and at bedtime. ) 84 tablet   . ibuprofen (ADVIL) 200 MG tablet Take 200 mg by mouth every 6 (six) hours as needed for headache.    . lidocaine-prilocaine (EMLA) cream Apply 1 application topically as needed. (Patient taking differently: Apply 1 application topically as needed (To access the port). ) 30 g 0  . loperamide (IMODIUM) 2 MG capsule Take 1-2 capsules (2-4 mg total) by mouth every 6 (six) hours as needed for diarrhea or loose stools. 90 capsule 1  . magnesium oxide (MAG-OX) 400 (241.3  Mg) MG tablet Take 1 tablet (400 mg total) by mouth daily. (Patient taking differently: Take 400 mg by mouth every other day. )    . pantoprazole (PROTONIX) 40 MG tablet Take 1 tablet (40 mg total) by mouth daily. 30 tablet 0  . potassium chloride SA (KLOR-CON) 20 MEQ tablet Take 2 tablets (40 mEq total) by mouth daily. 28 tablet 1  . sulindac (CLINORIL) 200 MG tablet Take 400 mg by mouth 2 (two) times daily.      Allergies as of 09/23/2019  . (No Known Allergies)    Family History  Problem Relation Age of Onset  . Lymphoma Mother 40       cancer in lymph nodes, unsure of primary  . Heart attack Father   . Lupus Sister     Social History   Socioeconomic History  . Marital status: Divorced    Spouse name: Not on file  . Number of children: 1  . Years of education: Not on file  . Highest education level: Not on file  Occupational History  . Occupation: disabled veteran   Tobacco Use  . Smoking status: Never Smoker  . Smokeless tobacco: Never Used  . Tobacco comment: plan to quit completely   Substance and Sexual Activity  . Alcohol use: Yes    Alcohol/week: 22.0 standard drinks    Types: 12 Cans of beer, 10 Shots of liquor per week  . Drug use: Not Currently  . Sexual activity:  Not on file  Other Topics Concern  . Not on file  Social History Narrative  . Not on file   Social Determinants of Health   Financial Resource Strain:   . Difficulty of Paying Living Expenses: Not on file  Food Insecurity:   . Worried About Charity fundraiser in the Last Year: Not on file  . Ran Out of Food in the Last Year: Not on file  Transportation Needs:   . Lack of Transportation (Medical): Not on file  . Lack of Transportation (Non-Medical): Not on file  Physical Activity:   . Days of Exercise per Week: Not on file  . Minutes of Exercise per Session: Not on file  Stress:   . Feeling of Stress : Not on file  Social Connections:   . Frequency of Communication with Friends and Family: Not on file  . Frequency of Social Gatherings with Friends and Family: Not on file  . Attends Religious Services: Not on file  . Active Member of Clubs or Organizations: Not on file  . Attends Archivist Meetings: Not on file  . Marital Status: Not on file  Intimate Partner Violence:   . Fear of Current or Ex-Partner: Not on file  . Emotionally Abused: Not on file  . Physically Abused: Not on file  . Sexually Abused: Not on file    Review of Systems: See HPI, all other systems reviewed and are negative  Physical Exam: Vital signs in last 24 hours: Temp:  [97.5 F (36.4 C)] 97.5 F (36.4 C) (08/20 0908) Pulse Rate:  [67-73] 72 (08/20 1030) Resp:  [16-23] 23 (08/20 1030) BP: (81-92)/(51-56) 92/56 (08/20 1030) SpO2:  [99 %-100 %] 100 % (08/20 1030) Weight:  [69.4 kg] 69.4 kg (08/20 0849)   General: Ill-appearing 64 year old male in no immediate distress. Head:  Normocephalic and atraumatic. Eyes:  Mild scleral icterus. Conjunctiva pink. Ears:  Normal auditory acuity. Nose:  No deformity, discharge or lesions. Mouth:  Dentition intact. No ulcers  or lesions.  Neck:  Supple. No lymphadenopathy or thyromegaly.  Lungs: Inspiratory wheezes left lung fields otherwise clear  throughout. Right subclavian mediport present. Heart: Tachycardic, no murmurs. Abdomen: Epigastric and right upper quadrant tenderness without rebound or guarding. Nondistended. No mass. No HSM. Positive bowel sounds all four quadrants. Rectal: Deferred. Musculoskeletal:  Symmetrical without gross deformities.  Pulses:  Normal pulses noted. Extremities:  Without clubbing or edema. Neurologic:  Alert and  oriented x4. No focal deficits.  Skin:  Intact without significant lesions or rashes. Psych:  Alert and cooperative. Normal mood and affect.  Intake/Output from previous day: No intake/output data recorded. Intake/Output this shift: No intake/output data recorded.  Lab Results: Recent Labs    09/23/19 0930  WBC 58.2*  HGB 8.6*  HCT 26.3*  PLT 172   BMET Recent Labs    09/23/19 0930  NA 133*  K 3.5  CL 100  CO2 19*  GLUCOSE 145*  BUN 17  CREATININE 2.02*  CALCIUM 8.6*   LFT Recent Labs    09/23/19 0930  PROT 6.6  ALBUMIN 2.6*  AST 35  ALT 24  ALKPHOS 292*  BILITOT 5.8*  BILIDIR 3.9*  IBILI 1.9*   PT/INR Recent Labs    09/23/19 0930  LABPROT 16.4*  INR 1.4*   Hepatitis Panel No results for input(s): HEPBSAG, HCVAB, HEPAIGM, HEPBIGM in the last 72 hours.    Studies/Results: CT ABDOMEN PELVIS WO CONTRAST  Result Date: 09/23/2019 CLINICAL DATA:  Nausea and vomiting. Weakness and hypertension for 1 week. History of pancreatic cancer. EXAM: CT ABDOMEN AND PELVIS WITHOUT CONTRAST TECHNIQUE: Multidetector CT imaging of the abdomen and pelvis was performed following the standard protocol without IV contrast. COMPARISON:  08/10/2019 MRI.  CT of 05/13/2019 FINDINGS: Lower chest: Clear lung bases. Normal heart size without pericardial or pleural effusion. Hepatobiliary: Limited evaluation for focal liver lesion. Common duct stent in place, with pneumobilia suggesting stent patency. Gallstones. Mild gallbladder distension with pericholecystic edema, including on  34/3. Pancreas: The pancreatic primary is poorly evaluated on this noncontrast exam. No gross pancreatic duct dilatation. No convincing evidence of pancreatitis. Spleen: Normal in size, without focal abnormality. Adrenals/Urinary Tract: Normal adrenal glands. No renal calculi or hydronephrosis. 2.0 cm interpolar left renal low-density lesion is likely a cyst. Mild right renal cortical thinning. No renal calculi or hydronephrosis. No bladder calculi. Stomach/Bowel: Normal stomach, without wall thickening. Scattered colonic diverticula. Normal terminal ileum and appendix. Normal small bowel. Vascular/Lymphatic: No gross arterial encasement. No abdominopelvic adenopathy. An 11 mm upper normal portal caval node is similar to 12 mm on the prior. 21/3 today. Reproductive: Normal prostate. Other: No significant free fluid. No evidence of omental or peritoneal disease. Musculoskeletal: Prior trauma or surgery involving right iliac crest. Mild wedging of the T12 vertebral body, chronic. IMPRESSION: 1. Findings highly suspicious for acute cholecystitis. Gallbladder distension and pericholecystic edema in the setting of gallstones. Depending on clinical concern, ultrasound could confirm. 2. Suboptimal evaluation of the pancreatic primary secondary to noncontrast technique. Common duct stent in place with pneumobilia. 3.  Aortic Atherosclerosis (ICD10-I70.0). Electronically Signed   By: Abigail Miyamoto M.D.   On: 09/23/2019 11:05   US Abdomen Limited RUQ  Result Date: 09/23/2019 CLINICAL DATA:  Nausea and vomiting. History of pancreatic cancer and biliary stent EXAM: ULTRASOUND ABDOMEN LIMITED RIGHT UPPER QUADRANT COMPARISON:  CT 09/23/2019 FINDINGS: Gallbladder: Gallbladder lumen is completely filled with heterogeneous sludge and small shadowing stones. Irregular wall thickening with associated pericholecystic fluid. No sonographic Murphy sign  noted by sonographer. Common bile duct: Diameter: 4 mm.  Pneumobilia noted. Liver:  Reverberation artifact within the intrahepatic biliary ducts compatible with pneumobilia. No focal lesion identified. Within normal limits in parenchymal echogenicity. Portal vein is patent on color Doppler imaging with normal direction of blood flow towards the liver. Other: None. IMPRESSION: 1. Abnormal appearance of the gallbladder which is distended by sludge and small stones. Irregular gallbladder wall thickening and pericholecystic edema. Findings are suspicious for acute cholecystitis. 2. Pneumobilia. Electronically Signed   By: Davina Poke D.O.   On: 09/23/2019 11:43    IMPRESSION/PLAN:  41.  64 year old male with pancreatic adenocarcinoma in the head/neck/body with malignant biliary stricture s/p repeat ERCP 4/30  with a metal bilary stent placement. See HPI. He was admitted to the hospital with N/V/D and elevated LFTs.  Alk phos 292. AST 35. ALT 24. T. Bili 5.8. Lipase 20. WBC 58.2. CTAP concerning for acute cholecystitis.  General surgery consult requested for possible perc chole drain. Dr.Jacobs from out GI team reviewed the patient has a covered metal biliary stent in place, this is more likely than other types of stones to obstruct the cystic duct and so even if it is completely patent and causing no obstruction the recommendation is to remove the and replace it with a plastic stent or uncovered metal stent.  -Timing of repeat ERCP biliary stent exchange to be further verified by Dr. Hilarie Fredrickson -NPO -IVF per the hospitalist -Zosyn IV 3.343m IV Q hrs  2. Leukocytosis. WBC 58.2. He is afebrile.  Secondary to # 1   3. Anemia. Hg 8.6, stable. Last transfused 1 unit of PRBCs by oncology on 8/2 for Hg 7.6.  Further recommendations per Dr. PHilarie Fredrickson  4.  Weight loss  Further recommendations per Dr. PMaurine MinisterMDorathy Daft 09/23/2019, 12:23 PM

## 2019-09-23 NOTE — Consult Note (Signed)
Referring Provider: No ref. provider found Primary Care Physician:  System, Pcp Not In Primary Gastroenterologist:  Dr. Havery Moros   Reason for Consultation:  Pancreatic cancer malignant biliary stricture with stent in place  HPI: Isaac Doyle is a 64 y.o. male history of hypertension.  He was diagnosed with pancreatic cancer with a malignant biliary stricture April 2021 by oncologist Dr. Burr Medico.  He presented to the hospital 05/09/2019 jaundice and elevated LFTs. Total bili 48.4.  AST 183.  ALT 176.  Alk phos 660.  WBC 10.9.  Hemoglobin 11.5.  An abdominal ultrasound showed a distended gallbladder with multiple stones and sludge with common bile duct and intrahepatic ductal dilatation. A MRCP showed biliary obstruction from a likely pancreatic malignancy. He underwent an ERCP 05/11/2019 by Dr. Henrene Pastor which identified a malignant distal bile duct stricture status post ERCP with biliary stent placement.  His LFTs drifted downward and he was discharged home on 05/13/2019. An outpatient EUS was done 05/19/2019 which identified a large pancreatic mass encasing the PV and SMA.  Pathology reports were positive for adenocarcinoma.  The previously placed plastic biliary stent was in the CBD in appropriate position.   He was readmitted to the hospital 06/03/2019 with obstructive jaundice.  A repeat ERCP was done 4/30 which showed the previously plastic stent had occluded and migrated partially into the duodenum.  The stent was removed.  The main bile duct was moderately dilated secondary to a distal malignant appearing 2 cm stricture, the right and left hepatic ducts were moderately dilated.  A 10 French by 6 cm covered metal stent was placed 5 cm into the common bile duct.  The stent was in good position post procedure.  LFTs did downward and he was discharged home on 06/04/2019.  He is followed by oncologist Dr. Annamaria Boots.  Chemotherapy with FOLFIRINOX every 2 weeks with starting 06/16/2019.  He was readmitted to the  hospital on 08/08/2019 due to having symptomatic anemia in the setting of pancreatic cancer with ongoing chemotherapy.  His hemoglobin level was 9.0. (Hg 8.2 on 08/04/2019).  Iron 22.  TIBC 182.  He received 2 units of packed red blood cells.  His alk phos level was 161.  AST 36.  ALT 34.  Total bili 2.7.  MRI/MRCP was done which showed multiple dependent gallstones, mild gallbladder wall thickening severely degenerated images due to motion artifact poor definition of the common hepatic duct and common bile duct but no intrahepatic biliary dilatation and therefore stent felt to be patent. His hemoglobin was 8.9 at the time of discharge on 7/7.  He was started on Pantoprazole 40 mg daily for GI prophylaxis.  He presented to [facility] emergency room today due to having nausea, vomiting x 2 (emesis consisted of partially undigested food, nonbloody emesis) and nonbloody diarrhea which started yesterday.  His chemotherapy was stopped last week due to worsening anemia which required a blood transfusion.  He was started on Ciprofloxacin 7 days ago due to having increased right upper quadrant pain concerning for possible cholangitis.  His abdominal pain worsened and he was advised to present to the ED for further evaluation.  He was hypertensive in the emergency room with SBP 90/100s. He received IV fluid his BP has stabilized. He is tachycardic. He is afebrile. General surgery was consulted for consideration of a percutaneous chole drain.  GI consult was requested to review the patency of the sustained biliary stent.   Labs 09/23/2019: Sodium 133.  Potassium 3.5.  BUN  17.  Creatinine 2.02.  Alk phos 292.  Lipase 20.  AST 35.  ALT 24.  Total bili 5.8.  Direct bili 3.9.  Lactic acid 2.1.  WBC 58.2.  Hemoglobin 8.6.  Hematocrit 26.3.  MCV 27.0.  Platelet 172.  INR 1.4.  Is coronavirus 2 negative.   Chest x-ray: No active disease  Right upper quadrant ultrasound 09/23/2019: 1. Abnormal appearance of the  gallbladder which is distended by sludge and small stones. Irregular gallbladder wall thickening and pericholecystic edema. Findings are suspicious for acute cholecystitis. 2. Pneumobilia.  Abdominal/pelvic CT with out contrast 09/23/2019: 1. Findings highly suspicious for acute cholecystitis. Gallbladder distension and pericholecystic edema in the setting of gallstones. Depending on clinical concern, ultrasound could confirm. 2. Suboptimal evaluation of the pancreatic primary secondary to noncontrast technique. Common duct stent in place with pneumobilia. 3.  Aortic Atherosclerosis    ERCP 06/03/2019 by Dr. 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  EUS 05/19/2019: - Large mass involving much of the pancreatic parenchyma, clear encasing the PV and possibly involving the SMA as well. The mass was sampled with 3 transduodenal EUS FNB passes and the preliminary cytology was positive for malignancy (adenocarcinoma). - Previously placed plastic biliary stent was in the CBD in good position.  ERCP 05/11/2019 by Dr. Henrene Pastor:  1. Malignant appearing distal bile duct stricture with upstream dilation. Status post ERCP with sphincterotomy and biliary stent placement   Past Medical History:  Diagnosis Date  . Hypertension     Past Surgical History:  Procedure Laterality Date  . BILIARY STENT PLACEMENT  05/11/2019   Procedure: BILIARY STENT PLACEMENT;  Surgeon: Irene Shipper, MD;  Location: [facility] ENDOSCOPY;  Service: Endoscopy;;  . BILIARY STENT PLACEMENT  06/03/2019   Procedure: BILIARY STENT PLACEMENT;  Surgeon: Jackquline Denmark, MD;  Location: [facility] ENDOSCOPY;  Service: Endoscopy;;  . ERCP N/A 05/11/2019   Procedure: ENDOSCOPIC RETROGRADE CHOLANGIOPANCREATOGRAPHY (ERCP);  Surgeon: Irene Shipper, MD;  Location: [facility] ENDOSCOPY;  Service: Endoscopy;  Laterality: N/A;  with   . ERCP N/A 06/03/2019   Procedure: ENDOSCOPIC RETROGRADE CHOLANGIOPANCREATOGRAPHY (ERCP);  Surgeon:  Jackquline Denmark, MD;  Location: [facility] ENDOSCOPY;  Service: Endoscopy;  Laterality: N/A;  . ESOPHAGOGASTRODUODENOSCOPY (EGD) WITH PROPOFOL N/A 05/19/2019   Procedure: ESOPHAGOGASTRODUODENOSCOPY (EGD) WITH PROPOFOL;  Surgeon: [name], MD;  Location: [facility] ENDOSCOPY;  Service: Endoscopy;  Laterality: N/A;  . EUS N/A 05/19/2019   Procedure: UPPER ENDOSCOPIC ULTRASOUND (EUS) LINEAR;  Surgeon: [name], MD;  Location: [facility] ENDOSCOPY;  Service: Endoscopy;  Laterality: N/A;  . FINE NEEDLE ASPIRATION N/A 05/19/2019   Procedure: FINE NEEDLE ASPIRATION (FNA) LINEAR;  Surgeon: [name], MD;  Location: [facility] ENDOSCOPY;  Service: Endoscopy;  Laterality: N/A;  . IR IMAGING GUIDED PORT INSERTION  06/08/2019  . SPHINCTEROTOMY  05/11/2019   Procedure: SPHINCTEROTOMY;  Surgeon: Irene Shipper, MD;  Location: [facility] ENDOSCOPY;  Service: Endoscopy;;  . [name] REMOVAL  06/03/2019   Procedure: STENT REMOVAL;  Surgeon: Jackquline Denmark, MD;  Location: [facility] ENDOSCOPY;  Service: Endoscopy;;    Prior to Admission medications   Medication Sig Start Date End Date Taking? Authorizing Provider  atenolol (TENORMIN) 100 MG tablet Take 1 tablet (100 mg total) by mouth daily. 05/14/19  Yes Vann, Jessica U, DO  cetirizine (ZYRTEC) 10 MG tablet Take 10 mg by mouth daily.   Yes [provider]  ciprofloxacin (CIPRO) 500 MG tablet Take 1 tablet (500 mg total) by mouth 2 (two) times daily.  09/19/19  Yes Alla Feeling, NP  diphenoxylate-atropine (LOMOTIL) 2.5-0.025 MG tablet Take 1-2 tablets by mouth 4 (four) times daily as needed for diarrhea or loose stools. Patient taking differently: Take 2 tablets by mouth 4 (four) times daily as needed for diarrhea or loose stools.  07/14/19  Yes Truitt Merle, MD  guaifenesin (HUMIBID E) 400 MG TABS tablet Take 1 tablet (400 mg total) by mouth every 6 (six) hours as needed. Patient taking differently: Take 400 mg by mouth in the morning and at bedtime.  05/13/19  Yes Vann, Jessica U, DO  ibuprofen  (ADVIL) 200 MG tablet Take 200 mg by mouth every 6 (six) hours as needed for headache.   Yes [provider]  lidocaine-prilocaine (EMLA) cream Apply 1 application topically as needed. Patient taking differently: Apply 1 application topically as needed (To access the port).  06/23/19  Yes Truitt Merle, MD  loperamide (IMODIUM) 2 MG capsule Take 1-2 capsules (2-4 mg total) by mouth every 6 (six) hours as needed for diarrhea or loose stools. 07/14/19  Yes Truitt Merle, MD  magnesium oxide (MAG-OX) 400 (241.3 Mg) MG tablet Take 1 tablet (400 mg total) by mouth daily. Patient taking differently: Take 400 mg by mouth every other day.  05/14/19  Yes Vann, Jessica U, DO  pantoprazole (PROTONIX) 40 MG tablet Take 1 tablet (40 mg total) by mouth daily. 08/10/19 09/23/19 Yes Arrien, Jimmy Picket, MD  potassium chloride SA (KLOR-CON) 20 MEQ tablet Take 2 tablets (40 mEq total) by mouth daily. 08/30/19  Yes Truitt Merle, MD  sulindac (CLINORIL) 200 MG tablet Take 400 mg by mouth 2 (two) times daily.   Yes [provider]    Current Facility-Administered Medications  Medication Dose Route Frequency Provider Last Rate Last Admin  . 0.9 %  sodium chloride infusion   Intravenous Once Curatolo, Adam, DO      . fentaNYL (SUBLIMAZE) injection 50 mcg  50 mcg Intravenous Once Curatolo, Adam, DO      . sodium chloride (PF) 0.9 % injection            Current Outpatient Medications  Medication Sig Dispense Refill  . atenolol (TENORMIN) 100 MG tablet Take 1 tablet (100 mg total) by mouth daily. 30 tablet 0  . cetirizine (ZYRTEC) 10 MG tablet Take 10 mg by mouth daily.    . ciprofloxacin (CIPRO) 500 MG tablet Take 1 tablet (500 mg total) by mouth 2 (two) times daily. 14 tablet 0  . diphenoxylate-atropine (LOMOTIL) 2.5-0.025 MG tablet Take 1-2 tablets by mouth 4 (four) times daily as needed for diarrhea or loose stools. (Patient taking differently: Take 2 tablets by mouth 4 (four) times daily as needed for diarrhea  or loose stools. ) 90 tablet 0  . guaifenesin (HUMIBID E) 400 MG TABS tablet Take 1 tablet (400 mg total) by mouth every 6 (six) hours as needed. (Patient taking differently: Take 400 mg by mouth in the morning and at bedtime. ) 84 tablet   . ibuprofen (ADVIL) 200 MG tablet Take 200 mg by mouth every 6 (six) hours as needed for headache.    . lidocaine-prilocaine (EMLA) cream Apply 1 application topically as needed. (Patient taking differently: Apply 1 application topically as needed (To access the port). ) 30 g 0  . loperamide (IMODIUM) 2 MG capsule Take 1-2 capsules (2-4 mg total) by mouth every 6 (six) hours as needed for diarrhea or loose stools. 90 capsule 1  . magnesium oxide (MAG-OX) 400 (241.3  Mg) MG tablet Take 1 tablet (400 mg total) by mouth daily. (Patient taking differently: Take 400 mg by mouth every other day. )    . pantoprazole (PROTONIX) 40 MG tablet Take 1 tablet (40 mg total) by mouth daily. 30 tablet 0  . potassium chloride SA (KLOR-CON) 20 MEQ tablet Take 2 tablets (40 mEq total) by mouth daily. 28 tablet 1  . sulindac (CLINORIL) 200 MG tablet Take 400 mg by mouth 2 (two) times daily.      Allergies as of 09/23/2019  . (No Known Allergies)    Family History  Problem Relation Age of Onset  . Lymphoma Mother 40       cancer in lymph nodes, unsure of primary  . Heart attack Father   . Lupus Sister     Social History   Socioeconomic History  . Marital status: Divorced    Spouse name: Not on file  . Number of children: 1  . Years of education: Not on file  . Highest education level: Not on file  Occupational History  . Occupation: disabled veteran   Tobacco Use  . Smoking status: Never Smoker  . Smokeless tobacco: Never Used  . Tobacco comment: plan to quit completely   Substance and Sexual Activity  . Alcohol use: Yes    Alcohol/week: 22.0 standard drinks    Types: 12 Cans of beer, 10 Shots of liquor per week  . Drug use: Not Currently  . Sexual activity:  Not on file  Other Topics Concern  . Not on file  Social History Narrative  . Not on file   Social Determinants of Health   Financial Resource Strain:   . Difficulty of Paying Living Expenses: Not on file  Food Insecurity:   . Worried About Charity fundraiser in the Last Year: Not on file  . Ran Out of Food in the Last Year: Not on file  Transportation Needs:   . Lack of Transportation (Medical): Not on file  . Lack of Transportation (Non-Medical): Not on file  Physical Activity:   . Days of Exercise per Week: Not on file  . Minutes of Exercise per Session: Not on file  Stress:   . Feeling of Stress : Not on file  Social Connections:   . Frequency of Communication with Friends and Family: Not on file  . Frequency of Social Gatherings with Friends and Family: Not on file  . Attends Religious Services: Not on file  . Active Member of Clubs or Organizations: Not on file  . Attends Archivist Meetings: Not on file  . Marital Status: Not on file  Intimate Partner Violence:   . Fear of Current or Ex-Partner: Not on file  . Emotionally Abused: Not on file  . Physically Abused: Not on file  . Sexually Abused: Not on file    Review of Systems: See HPI, all other systems reviewed and are negative  Physical Exam: Vital signs in last 24 hours: Temp:  [97.5 F (36.4 C)] 97.5 F (36.4 C) (08/20 0908) Pulse Rate:  [67-73] 72 (08/20 1030) Resp:  [16-23] 23 (08/20 1030) BP: (81-92)/(51-56) 92/56 (08/20 1030) SpO2:  [99 %-100 %] 100 % (08/20 1030) Weight:  [69.4 kg] 69.4 kg (08/20 0849)   General: Ill-appearing 64 year old male in no immediate distress. Head:  Normocephalic and atraumatic. Eyes:  Mild scleral icterus. Conjunctiva pink. Ears:  Normal auditory acuity. Nose:  No deformity, discharge or lesions. Mouth:  Dentition intact. No ulcers  or lesions.  Neck:  Supple. No lymphadenopathy or thyromegaly.  Lungs: Inspiratory wheezes left lung fields otherwise clear  throughout. Right subclavian mediport present. Heart: Tachycardic, no murmurs. Abdomen: Epigastric and right upper quadrant tenderness without rebound or guarding. Nondistended. No mass. No HSM. Positive bowel sounds all four quadrants. Rectal: Deferred. Musculoskeletal:  Symmetrical without gross deformities.  Pulses:  Normal pulses noted. Extremities:  Without clubbing or edema. Neurologic:  Alert and  oriented x4. No focal deficits.  Skin:  Intact without significant lesions or rashes. Psych:  Alert and cooperative. Normal mood and affect.  Intake/Output from previous day: No intake/output data recorded. Intake/Output this shift: No intake/output data recorded.  Lab Results: Recent Labs    09/23/19 0930  WBC 58.2*  HGB 8.6*  HCT 26.3*  PLT 172   BMET Recent Labs    09/23/19 0930  NA 133*  K 3.5  CL 100  CO2 19*  GLUCOSE 145*  BUN 17  CREATININE 2.02*  CALCIUM 8.6*   LFT Recent Labs    09/23/19 0930  PROT 6.6  ALBUMIN 2.6*  AST 35  ALT 24  ALKPHOS 292*  BILITOT 5.8*  BILIDIR 3.9*  IBILI 1.9*   PT/INR Recent Labs    09/23/19 0930  LABPROT 16.4*  INR 1.4*   Hepatitis Panel No results for input(s): HEPBSAG, HCVAB, HEPAIGM, HEPBIGM in the last 72 hours.    Studies/Results: CT ABDOMEN PELVIS WO CONTRAST  Result Date: 09/23/2019 CLINICAL DATA:  Nausea and vomiting. Weakness and hypertension for 1 week. History of pancreatic cancer. EXAM: CT ABDOMEN AND PELVIS WITHOUT CONTRAST TECHNIQUE: Multidetector CT imaging of the abdomen and pelvis was performed following the standard protocol without IV contrast. COMPARISON:  08/10/2019 MRI.  CT of 05/13/2019 FINDINGS: Lower chest: Clear lung bases. Normal heart size without pericardial or pleural effusion. Hepatobiliary: Limited evaluation for focal liver lesion. Common duct stent in place, with pneumobilia suggesting stent patency. Gallstones. Mild gallbladder distension with pericholecystic edema, including on  34/3. Pancreas: The pancreatic primary is poorly evaluated on this noncontrast exam. No gross pancreatic duct dilatation. No convincing evidence of pancreatitis. Spleen: Normal in size, without focal abnormality. Adrenals/Urinary Tract: Normal adrenal glands. No renal calculi or hydronephrosis. 2.0 cm interpolar left renal low-density lesion is likely a cyst. Mild right renal cortical thinning. No renal calculi or hydronephrosis. No bladder calculi. Stomach/Bowel: Normal stomach, without wall thickening. Scattered colonic diverticula. Normal terminal ileum and appendix. Normal small bowel. Vascular/Lymphatic: No gross arterial encasement. No abdominopelvic adenopathy. An 11 mm upper normal portal caval node is similar to 12 mm on the prior. 21/3 today. Reproductive: Normal prostate. Other: No significant free fluid. No evidence of omental or peritoneal disease. Musculoskeletal: Prior trauma or surgery involving right iliac crest. Mild wedging of the T12 vertebral body, chronic. IMPRESSION: 1. Findings highly suspicious for acute cholecystitis. Gallbladder distension and pericholecystic edema in the setting of gallstones. Depending on clinical concern, ultrasound could confirm. 2. Suboptimal evaluation of the pancreatic primary secondary to noncontrast technique. Common duct stent in place with pneumobilia. 3.  Aortic Atherosclerosis (ICD10-I70.0). Electronically Signed   By: Kyle  Talbot M.D.   On: 09/23/2019 11:05   US Abdomen Limited RUQ  Result Date: 09/23/2019 CLINICAL DATA:  Nausea and vomiting. History of pancreatic cancer and biliary stent EXAM: ULTRASOUND ABDOMEN LIMITED RIGHT UPPER QUADRANT COMPARISON:  CT 09/23/2019 FINDINGS: Gallbladder: Gallbladder lumen is completely filled with heterogeneous sludge and small shadowing stones. Irregular wall thickening with associated pericholecystic fluid. No sonographic Murphy sign   noted by sonographer. Common bile duct: Diameter: 4 mm.  Pneumobilia noted. Liver:  Reverberation artifact within the intrahepatic biliary ducts compatible with pneumobilia. No focal lesion identified. Within normal limits in parenchymal echogenicity. Portal vein is patent on color Doppler imaging with normal direction of blood flow towards the liver. Other: None. IMPRESSION: 1. Abnormal appearance of the gallbladder which is distended by sludge and small stones. Irregular gallbladder wall thickening and pericholecystic edema. Findings are suspicious for acute cholecystitis. 2. Pneumobilia. Electronically Signed   By: Davina Poke D.O.   On: 09/23/2019 11:43    IMPRESSION/PLAN:  41.  64 year old male with pancreatic adenocarcinoma in the head/neck/body with malignant biliary stricture s/p repeat ERCP 4/30  with a metal bilary stent placement. See HPI. He was admitted to the hospital with N/V/D and elevated LFTs.  Alk phos 292. AST 35. ALT 24. T. Bili 5.8. Lipase 20. WBC 58.2. CTAP concerning for acute cholecystitis.  General surgery consult requested for possible perc chole drain. Dr.Jacobs from out GI team reviewed the patient has a covered metal biliary stent in place, this is more likely than other types of stones to obstruct the cystic duct and so even if it is completely patent and causing no obstruction the recommendation is to remove the and replace it with a plastic stent or uncovered metal stent.  -Timing of repeat ERCP biliary stent exchange to be further verified by Dr. Hilarie Fredrickson -NPO -IVF per the hospitalist -Zosyn IV 3.343m IV Q hrs  2. Leukocytosis. WBC 58.2. He is afebrile.  Secondary to # 1   3. Anemia. Hg 8.6, stable. Last transfused 1 unit of PRBCs by oncology on 8/2 for Hg 7.6.  Further recommendations per Dr. PHilarie Fredrickson  4.  Weight loss  Further recommendations per Dr. PMaurine MinisterMDorathy Daft 09/23/2019, 12:23 PM

## 2019-09-23 NOTE — H&P (Signed)
History and Physical    Isaac Doyle UMP:[id number] DOB: 12/19/1955 DOA: 09/23/2019  PCP: System, Pcp Not In Patient coming from: Home. Lives alone. Uses cane at baseline.  Chief Complaint: Cold sweat, vomiting and diarrhea  HPI: Isaac Doyle is a 64 y.o. male with history of pancreatic cancer on chemotherapy, biliary obstruction s/p stent in 05/2019, anemia on intermittent transfusion and recent diagnosis with possible cholangitis presenting with hot flash, cold sweats, nausea, vomiting and diarrhea last night.   Patient reports sudden onset of hot flash, cold sweat, vomiting and diarrhea about midnight. He had 2 episodes of emesis and one episode of diarrhea. Emesis was nonbloody but greenish. Diarrhea was watery but denies blood. Last emesis and diarrhea was about midnight. He called EMS this morning because he had severe right upper quadrant pain. His pain was about 9/10 but improved to 6/10 after pain medication in ED. He describes his pain as cramping. Denies radiation to his shoulder or his back. He also reports shortness of breath since last night. He denies cough, fever, chest pain, runny nose, sore throat or UTI symptoms.  Of note, patient was started on Cipro out of concern for cholangitis when he was noted to have hyperbilirubinemia about 3 weeks ago. He also received G-CSF.   He lives alone. Uses cane at baseline. Denies smoking cigarettes, drinking alcohol or recreational drug use.  In ED, BP 81/51>> 180/68 after 2.5 L IV fluid. WBC 58 with left shift. Hgb 8.6 (about baseline). LA 2.1>> 2.0. Na 133. Bicarb 19. AG 15. Cr 2.0 (baseline ~1.0). BUN 17. ALP 292. AST and ALT within normal. Total bili 5.8 (3.9 direct). CT abdomen and pelvis  and RUQ [place] concerning for acute cholecystitis. Resuscitated with IV fluid. Cultures obtained. Started on IV Zosyn. GI and general surgery consulted, and hospitalist service called for admission.   ROS All review of system negative except for  pertinent positives and negatives as history of present illness above.  PMH Past Medical History:  Diagnosis Date  . Hypertension    PSH Past Surgical History:  Procedure Laterality Date  . BILIARY STENT PLACEMENT  05/11/2019   Procedure: BILIARY STENT PLACEMENT;  Surgeon: Irene Shipper, MD;  Location: [facility] ENDOSCOPY;  Service: Endoscopy;;  . BILIARY STENT PLACEMENT  06/03/2019   Procedure: BILIARY STENT PLACEMENT;  Surgeon: Jackquline Denmark, MD;  Location: [facility] ENDOSCOPY;  Service: Endoscopy;;  . ERCP N/A 05/11/2019   Procedure: ENDOSCOPIC RETROGRADE CHOLANGIOPANCREATOGRAPHY (ERCP);  Surgeon: Irene Shipper, MD;  Location: [facility] ENDOSCOPY;  Service: Endoscopy;  Laterality: N/A;  with   . ERCP N/A 06/03/2019   Procedure: ENDOSCOPIC RETROGRADE CHOLANGIOPANCREATOGRAPHY (ERCP);  Surgeon: Jackquline Denmark, MD;  Location: [facility] ENDOSCOPY;  Service: Endoscopy;  Laterality: N/A;  . ESOPHAGOGASTRODUODENOSCOPY (EGD) WITH PROPOFOL N/A 05/19/2019   Procedure: ESOPHAGOGASTRODUODENOSCOPY (EGD) WITH PROPOFOL;  Surgeon: [name], MD;  Location: [facility] ENDOSCOPY;  Service: Endoscopy;  Laterality: N/A;  . EUS N/A 05/19/2019   Procedure: UPPER ENDOSCOPIC ULTRASOUND (EUS) LINEAR;  Surgeon: [name], MD;  Location: [facility] ENDOSCOPY;  Service: Endoscopy;  Laterality: N/A;  . FINE NEEDLE ASPIRATION N/A 05/19/2019   Procedure: FINE NEEDLE ASPIRATION (FNA) LINEAR;  Surgeon: [name], MD;  Location: [facility] ENDOSCOPY;  Service: Endoscopy;  Laterality: N/A;  . IR IMAGING GUIDED PORT INSERTION  06/08/2019  . SPHINCTEROTOMY  05/11/2019   Procedure: SPHINCTEROTOMY;  Surgeon: Irene Shipper, MD;  Location: [facility] ENDOSCOPY;  Service: Endoscopy;;  . STENT REMOVAL  06/03/2019   Procedure:  STENT REMOVAL;  Surgeon: Jackquline Denmark, MD;  Location: [facility] ENDOSCOPY;  Service: Endoscopy;;   Carolin Guernsey Family History  Problem Relation Age of Onset  . Lymphoma Mother 46       cancer in lymph nodes, unsure of primary  . Heart attack Father   . Lupus  Sister     Social Hx  reports that he has never smoked. He has never used smokeless tobacco. He reports current alcohol use of about 22.0 standard drinks of alcohol per week. He reports previous drug use.  Allergy No Known Allergies Home Meds Prior to Admission medications   Medication Sig Start Date End Date Taking? Authorizing Provider  atenolol (TENORMIN) 100 MG tablet Take 1 tablet (100 mg total) by mouth daily. 05/14/19  Yes Vann, Jessica U, DO  cetirizine (ZYRTEC) 10 MG tablet Take 10 mg by mouth daily.   Yes [provider]  ciprofloxacin (CIPRO) 500 MG tablet Take 1 tablet (500 mg total) by mouth 2 (two) times daily. 09/19/19  Yes Alla Feeling, NP  diphenoxylate-atropine (LOMOTIL) 2.5-0.025 MG tablet Take 1-2 tablets by mouth 4 (four) times daily as needed for diarrhea or loose stools. Patient taking differently: Take 2 tablets by mouth 4 (four) times daily as needed for diarrhea or loose stools.  07/14/19  Yes Truitt Merle, MD  guaifenesin (HUMIBID E) 400 MG TABS tablet Take 1 tablet (400 mg total) by mouth every 6 (six) hours as needed. Patient taking differently: Take 400 mg by mouth in the morning and at bedtime.  05/13/19  Yes Vann, Jessica U, DO  ibuprofen (ADVIL) 200 MG tablet Take 200 mg by mouth every 6 (six) hours as needed for headache.   Yes [provider]  lidocaine-prilocaine (EMLA) cream Apply 1 application topically as needed. Patient taking differently: Apply 1 application topically as needed (To access the port).  06/23/19  Yes Truitt Merle, MD  loperamide (IMODIUM) 2 MG capsule Take 1-2 capsules (2-4 mg total) by mouth every 6 (six) hours as needed for diarrhea or loose stools. 07/14/19  Yes Truitt Merle, MD  magnesium oxide (MAG-OX) 400 (241.3 Mg) MG tablet Take 1 tablet (400 mg total) by mouth daily. Patient taking differently: Take 400 mg by mouth every other day.  05/14/19  Yes Vann, Jessica U, DO  pantoprazole (PROTONIX) 40 MG tablet Take 1 tablet (40 mg  total) by mouth daily. 08/10/19 09/23/19 Yes Arrien, Jimmy Picket, MD  potassium chloride SA (KLOR-CON) 20 MEQ tablet Take 2 tablets (40 mEq total) by mouth daily. 08/30/19  Yes Truitt Merle, MD  sulindac (CLINORIL) 200 MG tablet Take 400 mg by mouth 2 (two) times daily.   Yes [provider]    Physical Exam: Vitals:   09/23/19 0908 09/23/19 1001 09/23/19 1030 09/23/19 1318  BP:  (!) 82/51 (!) 92/56 (!) 107/58  Pulse:  67 72 74  Resp:  16 (!) 23 (!) 21  Temp: (!) 97.5 F (36.4 C)     TempSrc: Rectal     SpO2:  100% 100% 100%  Weight:      Height:        GENERAL: Frail looking elderly male. Appears well.  HEENT: MMM.  Vision and hearing grossly intact.  NECK: Supple.  No apparent JVD.  RESP:  No IWOB. Good air movement bilaterally. CVS:  RRR. Heart sounds normal.  ABD/GI/GU: Bowel sounds present. Soft. RUQ tenderness. Positive Murphy. MSK/EXT:  Moves extremities. Significant muscle mass wasting SKIN: no apparent skin lesion or  wound NEURO: Awake, alert and oriented appropriately.  No gross deficit.  PSYCH: Calm. Normal affect.   Port a cath in place over right chest.  Personally Reviewed Radiological Exams CT ABDOMEN PELVIS WO CONTRAST  Result Date: 09/23/2019 CLINICAL DATA:  Nausea and vomiting. Weakness and hypertension for 1 week. History of pancreatic cancer. EXAM: CT ABDOMEN AND PELVIS WITHOUT CONTRAST TECHNIQUE: Multidetector CT imaging of the abdomen and pelvis was performed following the standard protocol without IV contrast. COMPARISON:  08/10/2019 MRI.  CT of 05/13/2019 FINDINGS: Lower chest: Clear lung bases. Normal heart size without pericardial or pleural effusion. Hepatobiliary: Limited evaluation for focal liver lesion. Common duct stent in place, with pneumobilia suggesting stent patency. Gallstones. Mild gallbladder distension with pericholecystic edema, including on 34/3. Pancreas: The pancreatic primary is poorly evaluated on this noncontrast exam. No gross  pancreatic duct dilatation. No convincing evidence of pancreatitis. Spleen: Normal in size, without focal abnormality. Adrenals/Urinary Tract: Normal adrenal glands. No renal calculi or hydronephrosis. 2.0 cm interpolar left renal low-density lesion is likely a cyst. Mild right renal cortical thinning. No renal calculi or hydronephrosis. No bladder calculi. Stomach/Bowel: Normal stomach, without wall thickening. Scattered colonic diverticula. Normal terminal ileum and appendix. Normal small bowel. Vascular/Lymphatic: No gross arterial encasement. No abdominopelvic adenopathy. An 11 mm upper normal portal caval node is similar to 12 mm on the prior. 21/3 today. Reproductive: Normal prostate. Other: No significant free fluid. No evidence of omental or peritoneal disease. Musculoskeletal: Prior trauma or surgery involving right iliac crest. Mild wedging of the T12 vertebral body, chronic. IMPRESSION: 1. Findings highly suspicious for acute cholecystitis. Gallbladder distension and pericholecystic edema in the setting of gallstones. Depending on clinical concern, ultrasound could confirm. 2. Suboptimal evaluation of the pancreatic primary secondary to noncontrast technique. Common duct stent in place with pneumobilia. 3.  Aortic Atherosclerosis (ICD10-I70.0). Electronically Signed   By: Abigail Miyamoto M.D.   On: 09/23/2019 11:05   DG Chest Portable 1 View  Result Date: 09/23/2019 CLINICAL DATA:  Cough.  History of pancreatic cancer EXAM: PORTABLE CHEST 1 VIEW COMPARISON:  08/08/2019 FINDINGS: The heart size and mediastinal contours are within normal limits. Both lungs are clear. The visualized skeletal structures are unremarkable. Port-A-Cath tip in the lower SVC unchanged from the prior study. IMPRESSION: No active disease. Electronically Signed   By: Franchot Gallo M.D.   On: 09/23/2019 12:38   US Abdomen Limited RUQ  Result Date: 09/23/2019 CLINICAL DATA:  Nausea and vomiting. History of pancreatic cancer and  biliary stent EXAM: ULTRASOUND ABDOMEN LIMITED RIGHT UPPER QUADRANT COMPARISON:  CT 09/23/2019 FINDINGS: Gallbladder: Gallbladder lumen is completely filled with heterogeneous sludge and small shadowing stones. Irregular wall thickening with associated pericholecystic fluid. No sonographic Murphy sign noted by sonographer. Common bile duct: Diameter: 4 mm.  Pneumobilia noted. Liver: Reverberation artifact within the intrahepatic biliary ducts compatible with pneumobilia. No focal lesion identified. Within normal limits in parenchymal echogenicity. Portal vein is patent on color Doppler imaging with normal direction of blood flow towards the liver. Other: None. IMPRESSION: 1. Abnormal appearance of the gallbladder which is distended by sludge and small stones. Irregular gallbladder wall thickening and pericholecystic edema. Findings are suspicious for acute cholecystitis. 2. Pneumobilia. Electronically Signed   By: Davina Poke D.O.   On: 09/23/2019 11:43     Personally Reviewed Labs: CBC: Recent Labs  Lab 09/19/19 0833 09/23/19 0930  WBC 12.8* 58.2*  NEUTROABS 10.0* 52.2*  HGB 7.7* 8.6*  HCT 24.8* 26.3*  MCV 80.0 82.4  PLT 215 098   Basic Metabolic Panel: Recent Labs  Lab 09/19/19 0833 09/23/19 0930  NA 132* 133*  K 3.9 3.5  CL 102 100  CO2 20* 19*  GLUCOSE 119* 145*  BUN 11 17  CREATININE 1.10 2.02*  CALCIUM 9.6 8.6*   GFR: Estimated Creatinine Clearance: 35.8 mL/min (A) (by C-G formula based on SCr of 2.02 mg/dL (H)). Liver Function Tests: Recent Labs  Lab 09/19/19 0833 09/23/19 0930  AST 14* 35  ALT 13 24  ALKPHOS 155* 292*  BILITOT 1.5* 5.8*  PROT 6.8 6.6  ALBUMIN 2.7* 2.6*   Recent Labs  Lab 09/23/19 0930  LIPASE 20   No results for input(s): AMMONIA in the last 168 hours. Coagulation Profile: Recent Labs  Lab 09/23/19 0930  INR 1.4*   Cardiac Enzymes: No results for input(s): CKTOTAL, CKMB, CKMBINDEX, TROPONINI in the last 168 hours. BNP (last 3  results) No results for input(s): PROBNP in the last 8760 hours. HbA1C: No results for input(s): HGBA1C in the last 72 hours. CBG: No results for input(s): GLUCAP in the last 168 hours. Lipid Profile: No results for input(s): CHOL, HDL, LDLCALC, TRIG, CHOLHDL, LDLDIRECT in the last 72 hours. Thyroid Function Tests: No results for input(s): TSH, T4TOTAL, FREET4, T3FREE, THYROIDAB in the last 72 hours. Anemia Panel: No results for input(s): VITAMINB12, FOLATE, FERRITIN, TIBC, IRON, RETICCTPCT in the last 72 hours. Urine analysis:    Component Value Date/Time   COLORURINE AMBER (A) 08/08/2019 2230   APPEARANCEUR CLEAR 08/08/2019 2230   LABSPEC 1.013 08/08/2019 2230   PHURINE 6.0 08/08/2019 2230   GLUCOSEU NEGATIVE 08/08/2019 2230   HGBUR NEGATIVE 08/08/2019 2230   BILIRUBINUR NEGATIVE 08/08/2019 2230   KETONESUR NEGATIVE 08/08/2019 2230   PROTEINUR NEGATIVE 08/08/2019 2230   NITRITE NEGATIVE 08/08/2019 2230   LEUKOCYTESUR NEGATIVE 08/08/2019 2230    Sepsis Labs:  Lactic acid 2.1>> 2.0.  Personally Reviewed EKG:  EKG was not obtained  Assessment/Plan Severe sepsis due to acute cholecystitis-tachypnea to 23 with significant leukocytosis. Also with evidence of endorgan damage including hypotension and lactic acidosis. Patient is immunocompromised. CT abdomen and pelvis and RUQ [place] concerning for acute cholecystitis with patent biliary stent. -Admit to stepdown unit -Continue IV fluid -Continue IV Zosyn -Follow GI and general surgery recommendations -Trend lactic acidosis and   History of pancreatic cancer on chemotherapy-patient was to complete chemotherapy which was held last week due to anemia -Oncology involved. He is followed by Dr. Burr Medico  History of biliary obstruction s/p biliary stent in 05/2019: Per CT and ultrasound report, biliary stent seems to be patent. However, total bili and direct bilirubin elevated. -GI consulted by EDP.  Hyperbilirubinemia:  Obstructive? -Defer to GI. -Continue trending  Acute kidney injury- Cr 2.0 (baseline~1.1). BUN is within normal. Could be a combination of dehydration and NSAID use. -IV fluid -Avoid/minimize nephrotoxic meds -Recheck in the morning  Metabolic acidosis: Likely from emesis and renal failure -Continue monitoring  Dyspnea/shortness of breath: Suspect this to be from cholecystitis. Lung exam reassuring. No acute finding on CXR. -Try as needed DuoNeb  Essential hypertension: Hypotensive on admission. Now normotensive. -Hold home antihypertensive medications  Anemia of chronic disease: H&H stable. -Continue monitoring  Leukocytosis/bandemia: Likely due to infection and G-CSF. -Manage infection as above -Continue monitoring  DVT prophylaxis: SCD  Code Status: DNR/DNI Family Communication: Updated patient's daughter at bedside.  Disposition Plan: Admit to stepdown unit Consults called: General surgery, GI and oncology Admission status: Inpatient. Patient with severe sepsis and  acute cholecystitis.    Mercy Riding MD [facility]  If 7PM-7AM, please contact night-coverage [email or web address]  09/23/2019, 1:28 PM

## 2019-09-24 ENCOUNTER — Encounter (HOSPITAL_COMMUNITY): Payer: Self-pay | Admitting: Student

## 2019-09-24 ENCOUNTER — Encounter (HOSPITAL_COMMUNITY)
Admission: EM | Disposition: A | Discharge status: Home Health | Payer: Self-pay | Source: Home / Self Care | Attending: Family Medicine

## 2019-09-24 ENCOUNTER — Inpatient Hospital Stay (HOSPITAL_COMMUNITY): Payer: No Typology Code available for payment source

## 2019-09-24 ENCOUNTER — Inpatient Hospital Stay (HOSPITAL_COMMUNITY): Payer: No Typology Code available for payment source | Admitting: Registered Nurse

## 2019-09-24 DIAGNOSIS — R1011 Right upper quadrant pain: Secondary | ICD-10-CM

## 2019-09-24 DIAGNOSIS — Z4659 Encounter for fitting and adjustment of other gastrointestinal appliance and device: Secondary | ICD-10-CM

## 2019-09-24 DIAGNOSIS — R7989 Other specified abnormal findings of blood chemistry: Secondary | ICD-10-CM

## 2019-09-24 HISTORY — PX: REMOVAL OF STONES: SHX5545

## 2019-09-24 HISTORY — PX: BILIARY STENT PLACEMENT: SHX5538

## 2019-09-24 HISTORY — PX: STENT REMOVAL: SHX6421

## 2019-09-24 HISTORY — PX: ERCP: SHX5425

## 2019-09-24 LAB — CULTURE, BLOOD (SINGLE)
Culture: NO GROWTH
Culture: NO GROWTH
Special Requests: ADEQUATE

## 2019-09-24 LAB — CBC
HCT: 26.9 % — ABNORMAL LOW (ref 39.0–52.0)
Hemoglobin: 8.8 g/dL — ABNORMAL LOW (ref 13.0–17.0)
MCH: 27.1 pg (ref 26.0–34.0)
MCHC: 32.7 g/dL (ref 30.0–36.0)
MCV: 82.8 fL (ref 80.0–100.0)
Platelets: 129 10*3/uL — ABNORMAL LOW (ref 150–400)
RBC: 3.25 MIL/uL — ABNORMAL LOW (ref 4.22–5.81)
RDW: 16.6 % — ABNORMAL HIGH (ref 11.5–15.5)
WBC: 26 10*3/uL — ABNORMAL HIGH (ref 4.0–10.5)
nRBC: 0 % (ref 0.0–0.2)

## 2019-09-24 LAB — URINALYSIS, ROUTINE W REFLEX MICROSCOPIC
Bacteria, UA: NONE SEEN
Bilirubin Urine: NEGATIVE
Glucose, UA: NEGATIVE mg/dL
Ketones, ur: NEGATIVE mg/dL
Leukocytes,Ua: NEGATIVE
Nitrite: NEGATIVE
Protein, ur: 100 mg/dL — AB
Specific Gravity, Urine: 1.023 (ref 1.005–1.030)
pH: 5 (ref 5.0–8.0)

## 2019-09-24 LAB — COMPREHENSIVE METABOLIC PANEL
ALT: 19 U/L (ref 0–44)
AST: 25 U/L (ref 15–41)
Albumin: 2.1 g/dL — ABNORMAL LOW (ref 3.5–5.0)
Alkaline Phosphatase: 180 U/L — ABNORMAL HIGH (ref 38–126)
Anion gap: 12 (ref 5–15)
BUN: 25 mg/dL — ABNORMAL HIGH (ref 8–23)
CO2: 16 mmol/L — ABNORMAL LOW (ref 22–32)
Calcium: 7.2 mg/dL — ABNORMAL LOW (ref 8.9–10.3)
Chloride: 108 mmol/L (ref 98–111)
Creatinine, Ser: 1.87 mg/dL — ABNORMAL HIGH (ref 0.61–1.24)
GFR calc Af Amer: 43 mL/min — ABNORMAL LOW (ref >60)
GFR calc non Af Amer: 37 mL/min — ABNORMAL LOW (ref >60)
Glucose, Bld: 105 mg/dL — ABNORMAL HIGH (ref 70–99)
Potassium: 4.2 mmol/L (ref 3.5–5.1)
Sodium: 136 mmol/L (ref 135–145)
Total Bilirubin: 5.8 mg/dL — ABNORMAL HIGH (ref 0.3–1.2)
Total Protein: 5.6 g/dL — ABNORMAL LOW (ref 6.5–8.1)

## 2019-09-24 LAB — PROTIME-INR
INR: 1.7 — ABNORMAL HIGH (ref 0.8–1.2)
Prothrombin Time: 19.3 seconds — ABNORMAL HIGH (ref 11.4–15.2)

## 2019-09-24 LAB — PHOSPHORUS: Phosphorus: 3.4 mg/dL (ref 2.5–4.6)

## 2019-09-24 LAB — MAGNESIUM: Magnesium: 1 mg/dL — ABNORMAL LOW (ref 1.7–2.4)

## 2019-09-24 SURGERY — ERCP, WITH INTERVENTION IF INDICATED
Anesthesia: General

## 2019-09-24 MED ORDER — MIDAZOLAM HCL 2 MG/2ML IJ SOLN
INTRAMUSCULAR | Status: AC
  Filled 2019-09-24: qty 2

## 2019-09-24 MED ORDER — INDOMETHACIN 50 MG RE SUPP
RECTAL | Status: AC
  Filled 2019-09-24: qty 2

## 2019-09-24 MED ORDER — SODIUM CHLORIDE 0.9 % IV SOLN: INTRAVENOUS | Status: DC

## 2019-09-24 MED ORDER — MIDAZOLAM HCL 5 MG/5ML IJ SOLN
INTRAMUSCULAR | Status: DC | PRN
  Administered 2019-09-24: 1 mg via INTRAVENOUS

## 2019-09-24 MED ORDER — HYDROMORPHONE HCL 1 MG/ML IJ SOLN
0.2500 mg | INTRAMUSCULAR | Status: DC | PRN
  Filled 2019-09-24 (×2): qty 0.5

## 2019-09-24 MED ORDER — GLUCAGON HCL RDNA (DIAGNOSTIC) 1 MG IJ SOLR
INTRAMUSCULAR | Status: AC
  Filled 2019-09-24: qty 1

## 2019-09-24 MED ORDER — DEXAMETHASONE SODIUM PHOSPHATE 10 MG/ML IJ SOLN
INTRAMUSCULAR | Status: DC | PRN
  Administered 2019-09-24: 7 mg via INTRAVENOUS

## 2019-09-24 MED ORDER — OXYCODONE HCL 5 MG/5ML PO SOLN: 5.0000 mg | Freq: Once | ORAL | Status: AC | PRN

## 2019-09-24 MED ORDER — PROPOFOL 10 MG/ML IV BOLUS
INTRAVENOUS | Status: DC | PRN
  Administered 2019-09-24: 80 mg via INTRAVENOUS
  Administered 2019-09-24: 20 mg via INTRAVENOUS

## 2019-09-24 MED ORDER — GLUCAGON HCL RDNA (DIAGNOSTIC) 1 MG IJ SOLR
INTRAMUSCULAR | Status: DC | PRN
  Administered 2019-09-24 (×3): .25 mg via INTRAVENOUS

## 2019-09-24 MED ORDER — SUGAMMADEX SODIUM 200 MG/2ML IV SOLN
INTRAVENOUS | Status: DC | PRN
  Administered 2019-09-24: 150 mg via INTRAVENOUS

## 2019-09-24 MED ORDER — PROPOFOL 10 MG/ML IV BOLUS
INTRAVENOUS | Status: AC
  Filled 2019-09-24: qty 20

## 2019-09-24 MED ORDER — AMISULPRIDE (ANTIEMETIC) 5 MG/2ML IV SOLN
10.0000 mg | Freq: Once | INTRAVENOUS | Status: DC | PRN
  Filled 2019-09-24: qty 4

## 2019-09-24 MED ORDER — PHENYLEPHRINE HCL-NACL 10-0.9 MG/250ML-% IV SOLN
INTRAVENOUS | Status: DC | PRN
  Administered 2019-09-24: 25 ug/min via INTRAVENOUS

## 2019-09-24 MED ORDER — ROCURONIUM BROMIDE 10 MG/ML (PF) SYRINGE
PREFILLED_SYRINGE | INTRAVENOUS | Status: DC | PRN
  Administered 2019-09-24: 50 mg via INTRAVENOUS

## 2019-09-24 MED ORDER — HYDROMORPHONE HCL 2 MG/ML IJ SOLN
INTRAMUSCULAR | Status: AC
  Filled 2019-09-24: qty 1

## 2019-09-24 MED ORDER — MAGNESIUM SULFATE 2 GM/50ML IV SOLN
2.0000 g | Freq: Once | INTRAVENOUS | Status: AC
  Administered 2019-09-24: 2 g via INTRAVENOUS
  Filled 2019-09-24: qty 50

## 2019-09-24 MED ORDER — FENTANYL CITRATE (PF) 100 MCG/2ML IJ SOLN
INTRAMUSCULAR | Status: AC
  Filled 2019-09-24: qty 2

## 2019-09-24 MED ORDER — LIDOCAINE 2% (20 MG/ML) 5 ML SYRINGE
INTRAMUSCULAR | Status: DC | PRN
  Administered 2019-09-24: 50 mg via INTRAVENOUS

## 2019-09-24 MED ORDER — SODIUM CHLORIDE 0.9 % IV SOLN: INTRAVENOUS | Status: DC | PRN

## 2019-09-24 MED ORDER — PROMETHAZINE HCL 25 MG/ML IJ SOLN: 6.2500 mg | INTRAMUSCULAR | Status: DC | PRN

## 2019-09-24 MED ORDER — CHLORHEXIDINE GLUCONATE CLOTH 2 % EX PADS
6.0000 | MEDICATED_PAD | Freq: Every day | CUTANEOUS | Status: DC
  Administered 2019-09-24 – 2019-09-28 (×5): 6 via TOPICAL

## 2019-09-24 MED ORDER — CIPROFLOXACIN IN D5W 400 MG/200ML IV SOLN
INTRAVENOUS | Status: AC
  Filled 2019-09-24: qty 200

## 2019-09-24 MED ORDER — OXYCODONE HCL 5 MG PO TABS
5.0000 mg | ORAL_TABLET | Freq: Once | ORAL | Status: AC | PRN
  Administered 2019-09-24: 5 mg via ORAL
  Filled 2019-09-24: qty 1

## 2019-09-24 MED ORDER — PHENYLEPHRINE HCL (PRESSORS) 10 MG/ML IV SOLN
INTRAVENOUS | Status: DC | PRN
  Administered 2019-09-24 (×2): 80 ug via INTRAVENOUS

## 2019-09-24 MED ORDER — ALBUMIN HUMAN 5 % IV SOLN: INTRAVENOUS | Status: DC | PRN

## 2019-09-24 MED ORDER — FENTANYL CITRATE (PF) 100 MCG/2ML IJ SOLN
INTRAMUSCULAR | Status: DC | PRN
Start: 2019-09-24 — End: 2019-09-24
  Administered 2019-09-24: 50 ug via INTRAVENOUS

## 2019-09-24 NOTE — Progress Notes (Signed)
Neo gtt infusing at 33 mcg/min upon arrival to pacu.

## 2019-09-24 NOTE — Progress Notes (Addendum)
PROGRESS NOTE    Isaac Doyle  XBM:[id number] DOB: 1955/04/25 DOA: 09/23/2019 PCP: System, Pcp Not In   Brief Narrative:  Isaac Doyle is a 64 y.o. male with history of pancreatic cancer on chemotherapy, biliary obstruction s/p stent in 05/2019, anemia on intermittent transfusion and recent diagnosis with possible cholangitis presenting with hot flash, cold sweats, nausea, vomiting and diarrhea last night.  Patient reports sudden onset of hot flashes, cold sweat, vomiting and diarrhea about midnight. He had 2 episodes of emesis and one episode of diarrhea. Emesis was nonbloody but greenish. Diarrhea was watery but denies blood. Last emesis and diarrhea was about midnight. He called EMS this morning because he had severe right upper quadrant pain. His pain was about 9/10 but improved to 6/10 after pain medication in ED. He describes his pain as cramping. Denies radiation to his shoulder or his back. He also reports shortness of breath since last night. He denies cough, fever, chest pain, runny nose, sore throat or UTI symptoms.Of note, patient was started on Cipro out of concern for cholangitis when he was noted to have hyperbilirubinemia about 3 weeks ago. He also received G-CSF.    In ED, BP 81/51>> 180/68 after 2.5 L IV fluid. WBC 58 with left shift. Hgb 8.6 (about baseline). LA 2.1>> 2.0. Na 133. Bicarb 19. AG 15. Cr 2.0 (baseline ~1.0). BUN 17. ALP 292. AST and ALT within normal. Total bili 5.8 (3.9 direct). CT abdomen and pelvis  and RUQ [place] concerning for acute cholecystitis. Resuscitated with IV fluid. Cultures obtained. Started on IV Zosyn. GI and general surgery consulted, and hospitalist service called for admission. Patient admitted for sepsis secondary to acute cholangitis, he underwent ERCP with stone removal, tolerated well.  he reports feeling better.  Assessment & Plan:   Principal Problem:   Cholangitis Active Problems:   ARF (acute renal failure) (HCC)   Hypokalemia    Malignant biliary obstruction (HCC)   Malignant neoplasm of pancreas (HCC)   Malignant obstructive jaundice (HCC)   Symptomatic anemia   Port-A-Cath in place   Severe sepsis with acute organ dysfunction (HCC)   Common bile duct obstruction s/p metal biliary stent 06/2019   Anemia of chronic disease   Elevated LFTs   RUQ abdominal pain   Severe sepsis due to acute cholecystitis-  tachypnea to 23 with significant leukocytosis. Also with evidence of endorgan damage including hypotension and lactic acidosis. Patient is immunocompromised. CT abdomen and pelvis and RUQ [place] concerning for acute cholecystitis with patent biliary stent. -Admit to stepdown unit -Continue IV fluids -Continue IV Zosyn -Follow GI and general surgery recommendations -GI recommended ERCP, underwent ERCP with stone removal,  stent removal..  Tolerated well. -We will continue to trend LFTs and other labs.  History of pancreatic cancer on chemotherapy- Patient was to complete chemotherapy which was held last week due to anemia -Oncology involved. He is followed by Dr. Burr Medico  History of biliary obstruction s/p biliary stent in 05/2019: Per CT and ultrasound report, biliary stent seems to be patent. However, total bili and direct bilirubin elevated. -GI consulted by EDP.  Hyperbilirubinemia: Obstructive? -Defer to GI. -Continue trending  Acute kidney injury- Cr 2.0 (baseline~1.1). BUN is within normal. Could be a combination of dehydration and NSAID use. -IV fluids -Avoid/minimize nephrotoxic meds. -Recheck in the morning.  Metabolic acidosis: Likely from emesis and renal failure -Continue monitoring  Dyspnea/shortness of breath: Suspect this to be from cholecystitis. Lung exam reassuring. No acute finding on CXR. -Try  as needed DuoNeb  Essential hypertension: Hypotensive on admission. Now normotensive. -Hold home antihypertensive medications  Anemia of chronic disease: H&H stable. -Continue  monitoring  Leukocytosis/bandemia: Likely due to infection and G-CSF. -Manage infection as above -Continue monitoring  DVT prophylaxis: SCD          Code Status: DNR/DNI Family Communication: Updated patient's daughter at bedside.           Disposition Plan: Admit to stepdown unit Consults called: General surgery, GI and oncology Patient is not medically clear ongoing work-up for cholangitis cholecystitis. Anticipated discharge skilled nursing facility or home with home services in 1 to 2 days.   Consultants:   Gastroenterology, General surgery.  Procedures: ERCP, stent removal, biliary stent placement, removal of stones.  Antimicrobials:  Anti-infectives (From admission, onward)   Start     Dose/Rate Route Frequency Ordered Stop   09/23/19 1900  piperacillin-tazobactam (ZOSYN) IVPB 3.375 g        3.375 g 12.5 mL/hr over 240 Minutes Intravenous Every 8 hours 09/23/19 1246     09/23/19 1030  piperacillin-tazobactam (ZOSYN) IVPB 3.375 g        3.375 g 100 mL/hr over 30 Minutes Intravenous  Once 09/23/19 1030 09/23/19 1144      Subjective: Patient was seen and examined at bedside.  He is a s/p ERCP with stone removal.  He reports feeling better,  has tolerated crackers.  Objective: Vitals:   09/24/19 1000 09/24/19 1015 09/24/19 1030 09/24/19 1100  BP: 92/80 106/60 98/62 108/68  Pulse: 85   85  Resp: (!) 21 (!) 22 (!) 22 18  Temp:   97.6 F (36.4 C) 97.6 F (36.4 C)  TempSrc:    Oral  SpO2: 98%  98% 100%  Weight:      Height:        Intake/Output Summary (Last 24 hours) at 09/24/2019 1525 Last data filed at 09/24/2019 0929 Gross per 24 hour  Intake 1000 ml  Output --  Net 1000 ml   Filed Weights   09/23/19 0849  Weight: 69.4 kg    Examination:  General exam: Appears calm and comfortable  Respiratory system: Clear to auscultation. Respiratory effort normal. Cardiovascular system: S1 & S2 heard, RRR. No JVD, murmurs, rubs, gallops or clicks. No pedal  edema. Gastrointestinal system: Abdomen is nondistended, soft and nontender. No organomegaly or masses felt. Normal bowel sounds heard. Central nervous system: Alert and oriented. No focal neurological deficits. Extremities: Symmetric 5 x 5 power. Skin: No rashes, lesions or ulcers Psychiatry: Judgement and insight appear normal. Mood & affect appropriate.     Data Reviewed: I have personally reviewed following labs and imaging studies  CBC: Recent Labs  Lab 09/19/19 0833 09/23/19 0930 09/24/19 0418  WBC 12.8* 58.2* 26.0*  NEUTROABS 10.0* 52.2*  --   HGB 7.7* 8.6* 8.8*  HCT 24.8* 26.3* 26.9*  MCV 80.0 82.4 82.8  PLT 215 172 702*   Basic Metabolic Panel: Recent Labs  Lab 09/19/19 0833 09/23/19 0930 09/24/19 0418  NA 132* 133* 136  K 3.9 3.5 4.2  CL 102 100 108  CO2 20* 19* 16*  GLUCOSE 119* 145* 105*  BUN 11 17 25*  CREATININE 1.10 2.02* 1.87*  CALCIUM 9.6 8.6* 7.2*  MG  --   --  1.0*  PHOS  --   --  3.4   GFR: Estimated Creatinine Clearance: 38.7 mL/min (A) (by C-G formula based on SCr of 1.87 mg/dL (H)). Liver Function Tests: Recent Labs  Lab 09/19/19 0833 09/23/19 0930 09/24/19 0418  AST 14* 35 25  ALT 13 24 19   ALKPHOS 155* 292* 180*  BILITOT 1.5* 5.8* 5.8*  PROT 6.8 6.6 5.6*  ALBUMIN 2.7* 2.6* 2.1*   Recent Labs  Lab 09/23/19 0930  LIPASE 20   No results for input(s): AMMONIA in the last 168 hours. Coagulation Profile: Recent Labs  Lab 09/23/19 0930 09/24/19 0418  INR 1.4* 1.7*   Cardiac Enzymes: No results for input(s): CKTOTAL, CKMB, CKMBINDEX, TROPONINI in the last 168 hours. BNP (last 3 results) No results for input(s): PROBNP in the last 8760 hours. HbA1C: No results for input(s): HGBA1C in the last 72 hours. CBG: No results for input(s): GLUCAP in the last 168 hours. Lipid Profile: No results for input(s): CHOL, HDL, LDLCALC, TRIG, CHOLHDL, LDLDIRECT in the last 72 hours. Thyroid Function Tests: No results for input(s): TSH,  T4TOTAL, FREET4, T3FREE, THYROIDAB in the last 72 hours. Anemia Panel: No results for input(s): VITAMINB12, FOLATE, FERRITIN, TIBC, IRON, RETICCTPCT in the last 72 hours. Sepsis Labs: Recent Labs  Lab 09/23/19 0936 09/23/19 1315  LATICACIDVEN 2.1* 2.0*    Recent Results (from the past 240 hour(s))  Culture, Blood     Status: None (Preliminary result)   Collection Time: 09/19/19 10:15 AM   Specimen: BLOOD RIGHT HAND  Result Value Ref Range Status   Specimen Description   Final    BLOOD RIGHT HAND Performed at [facility], [address] [place], [place] [address]    Special Requests   Final    NONE Performed at [facility], [place] [address] [place], [place] [address]    Culture   Final    NO GROWTH 4 DAYS Performed at [facility], [place] [address], [place], [place] [address]    Report Status PENDING  Incomplete  Culture, Blood     Status: None (Preliminary result)   Collection Time: 09/19/19 10:45 AM   Specimen: Porta Cath; Blood  Result Value Ref Range Status   Specimen Description   Final    PORTA CATH Performed at [facility], [place] [address], [place], [place] [address]    Special Requests   Final    BOTTLES DRAWN AEROBIC AND ANAEROBIC Blood Culture adequate volume   Culture   Final    NO GROWTH 4 DAYS Performed at [facility], [place] [address], [place], [place] [address]    Report Status PENDING  Incomplete  SARS Coronavirus 2 by RT PCR (hospital order, performed in [facility] hospital lab) Nasopharyngeal Nasopharyngeal Swab     Status: None   Collection Time: 09/23/19  8:50 AM   Specimen: Nasopharyngeal Swab  Result Value Ref Range Status   SARS Coronavirus 2 NEGATIVE NEGATIVE Final    Comment: (NOTE) SARS-CoV-2 target nucleic acids are NOT DETECTED.  The SARS-CoV-2 RNA is generally detectable in upper and lower respiratory specimens during the acute phase of infection. The  lowest concentration of SARS-CoV-2 viral copies this assay can detect is 250 copies / mL. A negative result does not preclude SARS-CoV-2 infection and should not be used as the sole basis for treatment or other patient management decisions.  A negative result may occur with improper specimen collection / handling, submission of specimen other than nasopharyngeal swab, presence of viral mutation(s) within the areas targeted by this assay, and inadequate number of viral copies (<250 copies / mL). A negative result must be combined  with clinical observations, patient history, and epidemiological information.  Fact Sheet for Patients:   StrictlyIdeas.no  Fact Sheet for Healthcare Providers: BankingDealers.co.za  This test is not yet approved or  cleared by the [place] FDA and has been authorized for detection and/or diagnosis of SARS-CoV-2 by FDA under an Emergency Use Authorization (EUA).  This EUA will remain in effect (meaning this test can be used) for the duration of the COVID-19 declaration under Section 564(b)(1) of the Act, 21 U.S.C. section 360bbb-3(b)(1), unless the authorization is terminated or revoked sooner.  Performed at [facility], [place] [address] [place], [place] [address]      Radiology Studies: CT ABDOMEN PELVIS WO CONTRAST  Result Date: 09/23/2019 CLINICAL DATA:  Nausea and vomiting. Weakness and hypertension for 1 week. History of pancreatic cancer. EXAM: CT ABDOMEN AND PELVIS WITHOUT CONTRAST TECHNIQUE: Multidetector CT imaging of the abdomen and pelvis was performed following the standard protocol without IV contrast. COMPARISON:  08/10/2019 MRI.  CT of 05/13/2019 FINDINGS: Lower chest: Clear lung bases. Normal heart size without pericardial or pleural effusion. Hepatobiliary: Limited evaluation for focal liver lesion. Common duct stent in place, with pneumobilia suggesting stent patency.  Gallstones. Mild gallbladder distension with pericholecystic edema, including on 34/3. Pancreas: The pancreatic primary is poorly evaluated on this noncontrast exam. No gross pancreatic duct dilatation. No convincing evidence of pancreatitis. Spleen: Normal in size, without focal abnormality. Adrenals/Urinary Tract: Normal adrenal glands. No renal calculi or hydronephrosis. 2.0 cm interpolar left renal low-density lesion is likely a cyst. Mild right renal cortical thinning. No renal calculi or hydronephrosis. No bladder calculi. Stomach/Bowel: Normal stomach, without wall thickening. Scattered colonic diverticula. Normal terminal ileum and appendix. Normal small bowel. Vascular/Lymphatic: No gross arterial encasement. No abdominopelvic adenopathy. An 11 mm upper normal portal caval node is similar to 12 mm on the prior. 21/3 today. Reproductive: Normal prostate. Other: No significant free fluid. No evidence of omental or peritoneal disease. Musculoskeletal: Prior trauma or surgery involving right iliac crest. Mild wedging of the T12 vertebral body, chronic. IMPRESSION: 1. Findings highly suspicious for acute cholecystitis. Gallbladder distension and pericholecystic edema in the setting of gallstones. Depending on clinical concern, ultrasound could confirm. 2. Suboptimal evaluation of the pancreatic primary secondary to noncontrast technique. Common duct stent in place with pneumobilia. 3.  Aortic Atherosclerosis (ICD10-I70.0). Electronically Signed   By: Abigail Miyamoto M.D.   On: 09/23/2019 11:05   DG Chest Portable 1 View  Result Date: 09/23/2019 CLINICAL DATA:  Cough.  History of pancreatic cancer EXAM: PORTABLE CHEST 1 VIEW COMPARISON:  08/08/2019 FINDINGS: The heart size and mediastinal contours are within normal limits. Both lungs are clear. The visualized skeletal structures are unremarkable. Port-A-Cath tip in the lower SVC unchanged from the prior study. IMPRESSION: No active disease. Electronically Signed    By: Franchot Gallo M.D.   On: 09/23/2019 12:38   DG ERCP BILIARY & PANCREATIC DUCTS  Result Date: 09/24/2019 CLINICAL DATA:  Pancreas cancer, biliary obstruction occluded stent EXAM: ERCP balloon sweep and stent exchange TECHNIQUE: Multiple spot images obtained with the fluoroscopic device and submitted for interpretation post-procedure. FLUOROSCOPY TIME:  Fluoroscopy Time:  2 minutes 29 seconds COMPARISON:  09/23/2019 CT FINDINGS: Limited ERCP demonstrates removal of the covered biliary stent. Balloon sweep performed. This was followed by placement of a new metallic self expanding biliary stent in a similar position. Final image demonstrates residual pneumobilia with decompression of the biliary tree. IMPRESSION: Limited imaging during ERCP with distal CBD stent exchanged  These images were submitted for radiologic interpretation only. Please see the procedural report for the amount of contrast and the fluoroscopy time utilized. Electronically Signed   By: Jerilynn Mages.  Shick M.D.   On: 09/24/2019 11:57   US Abdomen Limited RUQ  Result Date: 09/23/2019 CLINICAL DATA:  Nausea and vomiting. History of pancreatic cancer and biliary stent EXAM: ULTRASOUND ABDOMEN LIMITED RIGHT UPPER QUADRANT COMPARISON:  CT 09/23/2019 FINDINGS: Gallbladder: Gallbladder lumen is completely filled with heterogeneous sludge and small shadowing stones. Irregular wall thickening with associated pericholecystic fluid. No sonographic Murphy sign noted by sonographer. Common bile duct: Diameter: 4 mm.  Pneumobilia noted. Liver: Reverberation artifact within the intrahepatic biliary ducts compatible with pneumobilia. No focal lesion identified. Within normal limits in parenchymal echogenicity. Portal vein is patent on color Doppler imaging with normal direction of blood flow towards the liver. Other: None. IMPRESSION: 1. Abnormal appearance of the gallbladder which is distended by sludge and small stones. Irregular gallbladder wall thickening  and pericholecystic edema. Findings are suspicious for acute cholecystitis. 2. Pneumobilia. Electronically Signed   By: Davina Poke D.O.   On: 09/23/2019 11:43    Scheduled Meds:  lip balm  1 application Topical BID   Continuous Infusions:  methocarbamol (ROBAXIN) IV     ondansetron (ZOFRAN) IV     piperacillin-tazobactam (ZOSYN)  IV 3.375 g (09/24/19 1424)     LOS: 1 day    Time spent: 35 mins.    Shawna Clamp, MD [facility]   If 7PM-7AM, please contact night-coverage

## 2019-09-24 NOTE — Procedures (Signed)
Impression: Duodenal bulb deformed d/t extrinsic compression. Biliary sludge found and removed with balloon sweeping. Distal CBD stricture.  Diffuse biliary tree dilation proximal to the stricture. Indwelling covered metal biliary stent was partially occluded and was removed.  Uncovered metal biliary stent placed.  Cystic duct and gallbladder were not filled.   Recommendations: Observe post ERCP with therapeutic interventions. Continue IV Zosyn.  Surgery considering percutaneous cholecystostomy for cholecystitis.

## 2019-09-24 NOTE — ED Notes (Signed)
He is taken at this time by the Endo nurse to our PACU. He is awake and in no distress.

## 2019-09-24 NOTE — Anesthesia Preprocedure Evaluation (Signed)
Anesthesia Evaluation  Patient identified by MRN, date of birth, ID band Patient awake    Reviewed: Allergy & Precautions, NPO status , Patient's Chart, lab work & pertinent test results  Airway Mallampati: II  TM Distance: >3 FB Neck ROM: Full    Dental  (+) Dental Advisory Given, Poor Dentition, Missing, Chipped,    Pulmonary    Pulmonary exam normal  + decreased breath sounds      Cardiovascular hypertension, Pt. on medications  Rhythm:Regular Rate:Normal     Neuro/Psych    GI/Hepatic Malignant biliary obstruction Malignant neoplasm of pancreas  Malignant obstructive jaundice      Endo/Other    Renal/GU ARFRenal disease     Musculoskeletal   Abdominal   Peds  Hematology  (+) Blood dyscrasia, anemia ,   Anesthesia Other Findings Pancreatic Cancer  Reproductive/Obstetrics                             Anesthesia Physical  Anesthesia Plan  ASA: III  Anesthesia Plan: General   Post-op Pain Management:    Induction: Intravenous  PONV Risk Score and Plan: 2 and Treatment may vary due to age or medical condition, Ondansetron and Midazolam  Airway Management Planned: Oral ETT  Additional Equipment: None  Intra-op Plan:   Post-operative Plan: Extubation in OR  Informed Consent: I have reviewed the patients History and Physical, chart, labs and discussed the procedure including the risks, benefits and alternatives for the proposed anesthesia with the patient or authorized representative who has indicated his/her understanding and acceptance.     Dental advisory given  Plan Discussed with: CRNA and Anesthesiologist  Anesthesia Plan Comments: (  )        Anesthesia Quick Evaluation

## 2019-09-24 NOTE — Anesthesia Postprocedure Evaluation (Signed)
Anesthesia Post Note  Patient: Nicolo Tomko  Procedure(s) Performed: ENDOSCOPIC RETROGRADE CHOLANGIOPANCREATOGRAPHY (ERCP) (N/A ) STENT REMOVAL BILIARY STENT PLACEMENT (N/A ) REMOVAL OF STONES     Patient location during evaluation: PACU Anesthesia Type: General Level of consciousness: awake and alert Pain management: pain level controlled Vital Signs Assessment: post-procedure vital signs reviewed and stable Respiratory status: spontaneous breathing, nonlabored ventilation and respiratory function stable Cardiovascular status: blood pressure returned to baseline and stable Postop Assessment: no apparent nausea or vomiting Anesthetic complications: no   No complications documented.  Last Vitals:  Vitals:   09/24/19 1000 09/24/19 1015  BP: 92/80 106/60  Pulse: 85   Resp: (!) 21 (!) 22  Temp:    SpO2: 98%     Last Pain:  Vitals:   09/24/19 1000  TempSrc:   PainSc: 0-No pain                 Lynda Rainwater

## 2019-09-24 NOTE — Progress Notes (Signed)
Neo gtt off 

## 2019-09-24 NOTE — Anesthesia Procedure Notes (Addendum)
Procedure Name: Intubation Date/Time: 09/24/2019 8:14 AM Performed by: Lissa Morales, CRNA Pre-anesthesia Checklist: Patient identified, Emergency Drugs available, Suction available and Patient being monitored Patient Re-evaluated:Patient Re-evaluated prior to induction Oxygen Delivery Method: Circle system utilized Preoxygenation: Pre-oxygenation with 100% oxygen Induction Type: IV induction Ventilation: Mask ventilation without difficulty Laryngoscope Size: Mac and 4 Grade View: Grade I Tube type: Oral Tube size: 8.0 mm Number of attempts: 2 Airway Equipment and Method: Stylet and Oral airway Placement Confirmation: ETT inserted through vocal cords under direct vision,  positive ETCO2 and breath sounds checked- equal and bilateral Secured at: 23 cm Tube secured with: Tape Dental Injury: Teeth and Oropharynx as per pre-operative assessment  Comments: Poor positioning with 1st attempt. Very loose right incisor, intact after intubation. Discussed potential for tooth damage preop. (ppr dentition  With rotten teeth preop

## 2019-09-24 NOTE — Interval H&P Note (Signed)
History and Physical Interval Note:  09/24/2019 8:09 AM  Isaac Doyle  has presented today for surgery, with the diagnosis of pancreatic cancer with biliary obstruction.  The various methods of treatment have been discussed with the patient and family. After consideration of risks, benefits and other options for treatment, the patient has consented to  Procedure(s): ENDOSCOPIC RETROGRADE CHOLANGIOPANCREATOGRAPHY (ERCP) (N/A) as a surgical intervention.  The patient's history has been reviewed, patient examined, no change in status, stable for surgery.  I have reviewed the patient's chart and labs.  Questions were answered to the patient's satisfaction.     Pricilla Riffle. Fuller Plan

## 2019-09-24 NOTE — Transfer of Care (Signed)
Immediate Anesthesia Transfer of Care Note  Patient: Isaac Doyle  Procedure(s) Performed: ENDOSCOPIC RETROGRADE CHOLANGIOPANCREATOGRAPHY (ERCP) (N/A ) STENT REMOVAL BILIARY STENT PLACEMENT (N/A ) REMOVAL OF STONES  Patient Location: PACU  Anesthesia Type:General  Level of Consciousness: awake, alert , oriented and patient cooperative  Airway & Oxygen Therapy: Patient Spontanous Breathing and Patient connected to face mask oxygen  Post-op Assessment: Report given to RN, Post -op Vital signs reviewed and stable and Patient moving all extremities X 4  Post vital signs: stable  Last Vitals:  Vitals Value Taken Time  BP 88/61 09/24/19 0930  Temp 36.3 C 09/24/19 0930  Pulse 45 09/24/19 0931  Resp 20 09/24/19 0938  SpO2 77 % 09/24/19 0931  Vitals shown include unvalidated device data.  Last Pain:  Vitals:   09/24/19 0752  TempSrc: Oral  PainSc: 0-No pain         Complications: No complications documented.

## 2019-09-24 NOTE — Op Note (Addendum)
[facility] Patient Name: Isaac Doyle Procedure Date: 09/24/2019 MRN: [id number] Attending MD: Ladene Artist , MD Date of Birth: 1956/01/14 CSN: [id number] Age: 64 Admit Type: Inpatient Procedure:                ERCP Indications:              Abdominal pain of suspected biliary origin,                            Elevated liver enzymes, Stent change Providers:                Pricilla Riffle. Fuller Plan, MD, Grace Isaac, RN, Laverda Sorenson, Technician, Enrigue Catena, CRNA Referring MD:             [facility] Medicines:                General Anesthesia Complications:            No immediate complications. Estimated Blood Loss:     Estimated blood loss: none. Procedure:                Pre-Anesthesia Assessment:                           - Prior to the procedure, a History and Physical                            was performed, and patient medications and                            allergies were reviewed. The patient's tolerance of                            previous anesthesia was also reviewed. The risks                            and benefits of the procedure and the sedation                            options and risks were discussed with the patient.                            All questions were answered, and informed consent                            was obtained. Prior Anticoagulants: The patient has                            taken no previous anticoagulant or antiplatelet                            agents. ASA Grade Assessment: III - A patient with  severe systemic disease. After reviewing the risks                            and benefits, the patient was deemed in                            satisfactory condition to undergo the procedure.                           After obtaining informed consent, the scope was                            passed under direct vision. Throughout the                            procedure, the  patient's blood pressure, pulse, and                            oxygen saturations were monitored continuously. The                            [id number]-Q180V ([id number] Olympus Duodenoscope was                            introduced through the mouth, and used to inject                            contrast into and used to inject contrast into the                            bile duct. The ERCP was accomplished without                            difficulty. The patient tolerated the procedure                            well. Scope In: Scope Out: Findings:      A biliary stent was visible on the scout film. The esophagus was       successfully intubated under direct vision. The scope was advanced to       the major papilla in the descending duodenum without detailed       examination of the pharynx, larynx and associated structures, and upper       GI tract. 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       Otherwise the upper GI tract was grossly normal. A straight Roadrunner       wire was passed into the biliary tree. The short-nosed traction       sphincterotome was passed over the guidewire and the bile duct was then       deeply cannulated. Contrast was injected. I personally interpreted the       bile duct images. There was appropriate flow of contrast through the       ducts. The common  bile duct contained filling defects thought to be       sludge and air was noted. The biliary tree proximal to the stricture was       diffusely dilated, with a mass causing an obstruction. The largest       diameter was 12 mm. The common bile duct contained a single localized       stenosis 2 cm in length. The biliary tree contained one covered metal       stent. This was found to be partially occluded. The stent was removed       using a rat-toothed forceps. The biliary tree was swept with a 12 mm       balloon  starting at the bifurcation. An occlusion cholangiogram was       obtained. A moderate amount of sludge was swept from the CBD and no       sludge remained. A 10 mm by 6 cm bare metal stent was placed 5 cm into       the common bile duct. Thick bile and contrast flowed through the stent.       The stent was in good position. Both the cystic duct and the gallbladder       did not fill. The PD was not cannulated or injected by intention. 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                           - Prior sphincterotomy.                           - Moderate CBD sludge and pneumobilia.                           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                           - The biliary tree proximal to the stricture was                            dilated, with a stricture/mass causing an                            obstruction.                           - One stent was exchanged in the common bile duct                            with  an uncovered metal stent placed.                           - The biliary tree was swept and sludge was found                            and removed.                           - The cystic duct and gallbladder were not filled. Moderate Sedation:      Not Applicable - Patient had care per Anesthesia. Recommendation:           - Return patient to hospital ward for ongoing care.                           - Observe patient's clinical course following                            today's ERCP with therapeutic intervention.                           - Continue IV Zosyn.                           - Surgery considering percutaneous cholecystostomy                            for cholecystitis. Procedure Code(s):        --- Professional ---                            [phone], Endoscopic retrograde                            cholangiopancreatography (ERCP); with removal and                            exchange of stent(s), biliary or pancreatic duct,                            including pre- and post-dilation and guide wire                            passage, when performed, including sphincterotomy,                            when performed, each stent exchanged                           [id number], Endoscopic retrograde                            cholangiopancreatography (ERCP); with removal of                            calculi/debris from biliary/pancreatic duct(s) Diagnosis Code(s):        ---  Professional ---                           K83.1, Obstruction of bile duct                           R10.9, Unspecified abdominal pain                           R74.8, Abnormal levels of other serum enzymes                           Z46.59, Encounter for fitting and adjustment of                            other gastrointestinal appliance and device CPT copyright 2019 [employer]. All rights reserved. The codes documented in this report are preliminary and upon coder review may  be revised to meet current compliance requirements. Ladene Artist, MD 09/24/2019 9:33:12 AM This report has been signed electronically. Number of Addenda: 0

## 2019-09-24 NOTE — Progress Notes (Signed)
Neo gtt infusing at 20 mcg/min

## 2019-09-25 ENCOUNTER — Encounter (HOSPITAL_COMMUNITY): Payer: Self-pay | Admitting: Gastroenterology

## 2019-09-25 DIAGNOSIS — R7989 Other specified abnormal findings of blood chemistry: Secondary | ICD-10-CM

## 2019-09-25 LAB — BLOOD CULTURE ID PANEL (REFLEXED) - BCID2

## 2019-09-25 LAB — COMPREHENSIVE METABOLIC PANEL
ALT: 20 U/L (ref 0–44)
AST: 23 U/L (ref 15–41)
Albumin: 2.4 g/dL — ABNORMAL LOW (ref 3.5–5.0)
Alkaline Phosphatase: 112 U/L (ref 38–126)
Anion gap: 11 (ref 5–15)
BUN: 38 mg/dL — ABNORMAL HIGH (ref 8–23)
CO2: 17 mmol/L — ABNORMAL LOW (ref 22–32)
Calcium: 7.3 mg/dL — ABNORMAL LOW (ref 8.9–10.3)
Chloride: 108 mmol/L (ref 98–111)
Creatinine, Ser: 2.1 mg/dL — ABNORMAL HIGH (ref 0.61–1.24)
GFR calc Af Amer: 37 mL/min — ABNORMAL LOW (ref >60)
GFR calc non Af Amer: 32 mL/min — ABNORMAL LOW (ref >60)
Glucose, Bld: 201 mg/dL — ABNORMAL HIGH (ref 70–99)
Potassium: 4.3 mmol/L (ref 3.5–5.1)
Sodium: 136 mmol/L (ref 135–145)
Total Bilirubin: 4.2 mg/dL — ABNORMAL HIGH (ref 0.3–1.2)
Total Protein: 5.4 g/dL — ABNORMAL LOW (ref 6.5–8.1)

## 2019-09-25 LAB — CBC
HCT: 22.4 % — ABNORMAL LOW (ref 39.0–52.0)
Hemoglobin: 7.4 g/dL — ABNORMAL LOW (ref 13.0–17.0)
MCH: 27 pg (ref 26.0–34.0)
MCHC: 33 g/dL (ref 30.0–36.0)
MCV: 81.8 fL (ref 80.0–100.0)
Platelets: 108 10*3/uL — ABNORMAL LOW (ref 150–400)
RBC: 2.74 MIL/uL — ABNORMAL LOW (ref 4.22–5.81)
RDW: 17 % — ABNORMAL HIGH (ref 11.5–15.5)
WBC: 18.1 10*3/uL — ABNORMAL HIGH (ref 4.0–10.5)
nRBC: 0 % (ref 0.0–0.2)

## 2019-09-25 LAB — URINE CULTURE: Culture: NO GROWTH

## 2019-09-25 LAB — PREPARE RBC (CROSSMATCH)

## 2019-09-25 LAB — MAGNESIUM: Magnesium: 1.9 mg/dL (ref 1.7–2.4)

## 2019-09-25 LAB — PHOSPHORUS: Phosphorus: 3.8 mg/dL (ref 2.5–4.6)

## 2019-09-25 MED ORDER — SODIUM CHLORIDE 0.9% IV SOLUTION: Freq: Once | INTRAVENOUS | Status: DC

## 2019-09-25 MED ORDER — SODIUM CHLORIDE 0.9% FLUSH
10.0000 mL | Freq: Two times a day (BID) | INTRAVENOUS | Status: DC
  Administered 2019-09-25 – 2019-09-27 (×4): 10 mL

## 2019-09-25 MED ORDER — SODIUM CHLORIDE 0.9% FLUSH
10.0000 mL | INTRAVENOUS | Status: DC | PRN
  Administered 2019-09-28: 10 mL

## 2019-09-25 NOTE — Progress Notes (Signed)
PROGRESS NOTE    Isaac Doyle  ELF:[id number] DOB: 1956/01/25 DOA: 09/23/2019 PCP: System, Pcp Not In   Brief Narrative:  Isaac Doyle is a 64 y.o. male with history of pancreatic cancer on chemotherapy, biliary obstruction s/p stent in 05/2019, anemia on intermittent transfusion and recent diagnosis with possible cholangitis presenting with hot flash, cold sweats, nausea, vomiting and diarrhea last night.  Patient reports sudden onset of hot flashes, cold sweat, vomiting and diarrhea about midnight. He had 2 episodes of emesis and one episode of diarrhea. Emesis was nonbloody but greenish. Diarrhea was watery but denies blood. Last emesis and diarrhea was about midnight. He called EMS this morning because he had severe right upper quadrant pain. His pain was about 9/10 but improved to 6/10 after pain medication in ED. He describes his pain as cramping. Denies radiation to his shoulder or his back. He also reports shortness of breath since last night. He denies cough, fever, chest pain, runny nose, sore throat or UTI symptoms.Of note, patient was started on Cipro out of concern for cholangitis when he was noted to have hyperbilirubinemia about 3 weeks ago. He also received G-CSF.    In ED, BP 81/51>> 180/68 after 2.5 L IV fluid. WBC 58 with left shift. Hgb 8.6 (about baseline). LA 2.1>> 2.0. Na 133. Bicarb 19. AG 15. Cr 2.0 (baseline ~1.0). BUN 17. ALP 292. AST and ALT within normal. Total bili 5.8 (3.9 direct). CT abdomen and pelvis  and RUQ [place] concerning for acute cholecystitis. Resuscitated with IV fluid. Cultures obtained. Started on IV Zosyn. GI and general surgery consulted, and hospitalist service called for admission. Patient admitted for sepsis secondary to acute cholangitis, he underwent ERCP with stone removal, tolerated well.  he reports feeling better, GI recommended continue current management, LFTS improving. Patient might not need cholecystectomy.  Assessment & Plan:     Principal Problem:   Cholangitis Active Problems:   ARF (acute renal failure) (HCC)   Hypokalemia   Malignant biliary obstruction (HCC)   Malignant neoplasm of pancreas (HCC)   Malignant obstructive jaundice (HCC)   Symptomatic anemia   Port-A-Cath in place   Severe sepsis with acute organ dysfunction (HCC)   Common bile duct obstruction s/p metal biliary stent 06/2019   Anemia of chronic disease   Elevated LFTs   RUQ abdominal pain   Severe sepsis due to acute cholecystitis-  tachypnea to 23 with significant leukocytosis. Also with evidence of endorgan damage including hypotension and lactic acidosis. Patient is immunocompromised. CT abdomen and pelvis and RUQ [place] concerning for acute cholecystitis with patent biliary stent. -Admit to stepdown unit -Continue IV fluids -Continue IV Zosyn -Follow GI and general surgery recommendations -GI recommended ERCP, underwent ERCP with stone removal,  stent removal..  Tolerated well. -We will continue to trend LFTs and other labs. -LFTs trending down, patient reports feeling better, -Patient might not need cholecystectomy, started clear liquid diet.  Will advance to full liquids.  History of pancreatic cancer on chemotherapy- Patient was to complete chemotherapy which was held last week due to anemia -Oncology involved. He is followed by Dr. Burr Medico  History of biliary obstruction s/p biliary stent in 05/2019: Per CT and ultrasound report, biliary stent seems to be patent. However, total bili and direct bilirubin elevated. -GI consulted by EDP.  Hyperbilirubinemia: Obstructive? -Defer to GI. -Continue trending  Acute kidney injury- Cr 2.0 (baseline~1.1). BUN is within normal. Could be a combination of dehydration and NSAID use. -IV fluids -Avoid/minimize nephrotoxic  meds. -Recheck in the morning.  Metabolic acidosis: Likely from emesis and renal failure. -Continue monitoring  Dyspnea/shortness of breath: Suspect this to be from  cholecystitis. Lung exam reassuring. No acute finding on CXR. -Try as needed DuoNeb  Essential hypertension: Hypotensive on admission. Now normotensive. -Hold home antihypertensive medications  Anemia of chronic disease: H&H stable. -Hemoglobin 7.4,  will transfuse 1 PRBC. -Continue monitoring, check posttransfusion CBC.  Leukocytosis/bandemia: Likely due to infection and G-CSF. -Manage infection as above -Continue monitoring  DVT prophylaxis: SCD          Code Status: DNR/DNI Family Communication: Updated patient's daughter at bedside.           Disposition Plan: Admit to stepdown unit Consults called: General surgery, GI and oncology Patient is not medically clear ongoing work-up for cholangitis cholecystitis. Anticipated discharge skilled nursing facility or home with home services in 1 to 2 days.   Consultants:   Gastroenterology, General surgery.  Procedures: ERCP, stent removal, biliary stent placement, removal of stones.  Antimicrobials:  Anti-infectives (From admission, onward)   Start     Dose/Rate Route Frequency Ordered Stop   09/23/19 1900  piperacillin-tazobactam (ZOSYN) IVPB 3.375 g        3.375 g 12.5 mL/hr over 240 Minutes Intravenous Every 8 hours 09/23/19 1246     09/23/19 1030  piperacillin-tazobactam (ZOSYN) IVPB 3.375 g        3.375 g 100 mL/hr over 30 Minutes Intravenous  Once 09/23/19 1030 09/23/19 1144      Subjective: Patient was seen and examined at bedside.  No overnight events.  He is a s/p ERCP with stone removal.  He reports feeling better,  has tolerated crackers.  Objective: Vitals:   09/24/19 1651 09/24/19 2051 09/25/19 0151 09/25/19 0614  BP: 100/68 100/67 102/69 98/67  Pulse: 83 85 79 72  Resp: 20 20 20 20   Temp: 97.8 F (36.6 C) 97.8 F (36.6 C) 97.7 F (36.5 C) 97.9 F (36.6 C)  TempSrc: Oral Oral Oral Oral  SpO2: 100% 100% 100% 100%  Weight:      Height:        Intake/Output Summary (Last 24 hours) at 09/25/2019  0837 Last data filed at 09/24/2019 1700 Gross per 24 hour  Intake 1131.88 ml  Output --  Net 1131.88 ml   Filed Weights   09/23/19 0849  Weight: 69.4 kg    Examination:  General exam: Appears calm and comfortable  Respiratory system: Clear to auscultation. Respiratory effort normal. Cardiovascular system: S1 & S2 heard, RRR. No JVD, murmurs, rubs, gallops or clicks. No pedal edema. Gastrointestinal system: Abdomen is nondistended, soft and nontender. No organomegaly or masses felt. Normal bowel sounds heard. Central nervous system: Alert and oriented. No focal neurological deficits. Extremities: No leg edema, no cyanosis no swelling. Skin: No rashes, lesions or ulcers Psychiatry: Judgement and insight appear normal. Mood & affect appropriate.     Data Reviewed: I have personally reviewed following labs and imaging studies  CBC: Recent Labs  Lab 09/19/19 0833 09/23/19 0930 09/24/19 0418 09/25/19 0443  WBC 12.8* 58.2* 26.0* 18.1*  NEUTROABS 10.0* 52.2*  --   --   HGB 7.7* 8.6* 8.8* 7.4*  HCT 24.8* 26.3* 26.9* 22.4*  MCV 80.0 82.4 82.8 81.8  PLT 215 172 129* 950*   Basic Metabolic Panel: Recent Labs  Lab 09/19/19 0833 09/23/19 0930 09/24/19 0418 09/25/19 0443  NA 132* 133* 136 136  K 3.9 3.5 4.2 4.3  CL 102 100 108  108  CO2 20* 19* 16* 17*  GLUCOSE 119* 145* 105* 201*  BUN 11 17 25* 38*  CREATININE 1.10 2.02* 1.87* 2.10*  CALCIUM 9.6 8.6* 7.2* 7.3*  MG  --   --  1.0* 1.9  PHOS  --   --  3.4 3.8   GFR: Estimated Creatinine Clearance: 34.4 mL/min (A) (by C-G formula based on SCr of 2.1 mg/dL (H)). Liver Function Tests: Recent Labs  Lab 09/19/19 0833 09/23/19 0930 09/24/19 0418 09/25/19 0443  AST 14* 35 25 23  ALT 13 24 19 20   ALKPHOS 155* 292* 180* 112  BILITOT 1.5* 5.8* 5.8* 4.2*  PROT 6.8 6.6 5.6* 5.4*  ALBUMIN 2.7* 2.6* 2.1* 2.4*   Recent Labs  Lab 09/23/19 0930  LIPASE 20   No results for input(s): AMMONIA in the last 168  hours. Coagulation Profile: Recent Labs  Lab 09/23/19 0930 09/24/19 0418  INR 1.4* 1.7*   Cardiac Enzymes: No results for input(s): CKTOTAL, CKMB, CKMBINDEX, TROPONINI in the last 168 hours. BNP (last 3 results) No results for input(s): PROBNP in the last 8760 hours. HbA1C: No results for input(s): HGBA1C in the last 72 hours. CBG: No results for input(s): GLUCAP in the last 168 hours. Lipid Profile: No results for input(s): CHOL, HDL, LDLCALC, TRIG, CHOLHDL, LDLDIRECT in the last 72 hours. Thyroid Function Tests: No results for input(s): TSH, T4TOTAL, FREET4, T3FREE, THYROIDAB in the last 72 hours. Anemia Panel: No results for input(s): VITAMINB12, FOLATE, FERRITIN, TIBC, IRON, RETICCTPCT in the last 72 hours. Sepsis Labs: Recent Labs  Lab 09/23/19 0936 09/23/19 1315  LATICACIDVEN 2.1* 2.0*    Recent Results (from the past 240 hour(s))  Culture, Blood     Status: None   Collection Time: 09/19/19 10:15 AM   Specimen: BLOOD RIGHT HAND  Result Value Ref Range Status   Specimen Description   Final    BLOOD RIGHT HAND Performed at [facility], [address], [place], [place] [address]    Special Requests   Final    NONE Performed at [facility], [place] [address], [place], [place] [address]    Culture   Final    NO GROWTH 5 DAYS Performed at [facility], [place] [address]  [place], [place] [address]    Report Status 09/24/2019 FINAL  Final  Culture, Blood     Status: None   Collection Time: 09/19/19 10:45 AM   Specimen: Porta Cath; Blood  Result Value Ref Range Status   Specimen Description   Final    PORTA CATH Performed at [facility], [place] [address] [place], [place] [address]    Special Requests   Final    BOTTLES DRAWN AEROBIC AND ANAEROBIC Blood Culture adequate volume   Culture   Final    NO GROWTH 5 DAYS Performed at [facility], [place] [address] [place], [place]  [address]    Report Status 09/24/2019 FINAL  Final  SARS Coronavirus 2 by RT PCR (hospital order, performed in [facility] hospital lab) Nasopharyngeal Nasopharyngeal Swab     Status: None   Collection Time: 09/23/19  8:50 AM   Specimen: Nasopharyngeal Swab  Result Value Ref Range Status   SARS Coronavirus 2 NEGATIVE NEGATIVE Final    Comment: (NOTE) SARS-CoV-2 target nucleic acids are NOT DETECTED.  The SARS-CoV-2 RNA is generally detectable in upper and lower respiratory specimens during the acute phase of infection. The lowest concentration of  SARS-CoV-2 viral copies this assay can detect is 250 copies / mL. A negative result does not preclude SARS-CoV-2 infection and should not be used as the sole basis for treatment or other patient management decisions.  A negative result may occur with improper specimen collection / handling, submission of specimen other than nasopharyngeal swab, presence of viral mutation(s) within the areas targeted by this assay, and inadequate number of viral copies (<250 copies / mL). A negative result must be combined with clinical observations, patient history, and epidemiological information.  Fact Sheet for Patients:   StrictlyIdeas.no  Fact Sheet for Healthcare Providers: BankingDealers.co.za  This test is not yet approved or  cleared by the [place] FDA and has been authorized for detection and/or diagnosis of SARS-CoV-2 by FDA under an Emergency Use Authorization (EUA).  This EUA will remain in effect (meaning this test can be used) for the duration of the COVID-19 declaration under Section 564(b)(1) of the Act, 21 U.S.C. section 360bbb-3(b)(1), unless the authorization is terminated or revoked sooner.  Performed at [facility], [place] [address], [place], [place] [address]   Blood culture (routine x 2)     Status: None (Preliminary result)   Collection Time: 09/23/19  9:30 AM    Specimen: BLOOD  Result Value Ref Range Status   Specimen Description   Final    BLOOD SITE NOT SPECIFIED Performed at [facility] [address], [place], [place] [address]    Special Requests   Final    BOTTLES DRAWN AEROBIC AND ANAEROBIC Blood Culture results may not be optimal due to an inadequate volume of blood received in culture bottles Performed at [facility] [address] [place], [place] [address]    Culture  Setup Time   Final    ANAEROBIC BOTTLE ONLY GRAM POSITIVE COCCI CRITICAL RESULT CALLED TO, READ BACK BY AND VERIFIED WITH: Concepcion Elk [id number] FCP Performed at [facility], [place] [address] [place], [place] [address]    Culture GRAM POSITIVE COCCI  Final   Report Status PENDING  Incomplete  Blood Culture ID Panel (Reflexed)     Status: None   Collection Time: 09/23/19  9:30 AM  Result Value Ref Range Status   Enterococcus faecalis NOT DETECTED NOT DETECTED Final   Enterococcus Faecium NOT DETECTED NOT DETECTED Final   Listeria monocytogenes NOT DETECTED NOT DETECTED Final   Staphylococcus species NOT DETECTED NOT DETECTED Final   Staphylococcus aureus (BCID) NOT DETECTED NOT DETECTED Final   Staphylococcus epidermidis NOT DETECTED NOT DETECTED Final   Staphylococcus lugdunensis NOT DETECTED NOT DETECTED Final   Streptococcus species NOT DETECTED NOT DETECTED Final   Streptococcus agalactiae NOT DETECTED NOT DETECTED Final   Streptococcus pneumoniae NOT DETECTED NOT DETECTED Final   Streptococcus pyogenes NOT DETECTED NOT DETECTED Final   A.calcoaceticus-baumannii NOT DETECTED NOT DETECTED Final   Bacteroides fragilis NOT DETECTED NOT DETECTED Final   Enterobacterales NOT DETECTED NOT DETECTED Final   Enterobacter cloacae complex NOT DETECTED NOT DETECTED Final   Escherichia coli NOT DETECTED NOT DETECTED Final   Klebsiella aerogenes NOT DETECTED NOT DETECTED Final   Klebsiella oxytoca NOT DETECTED NOT  DETECTED Final   Klebsiella pneumoniae NOT DETECTED NOT DETECTED Final   Proteus species NOT DETECTED NOT DETECTED Final   Salmonella species NOT DETECTED NOT DETECTED Final   Serratia marcescens NOT DETECTED NOT DETECTED Final   Haemophilus influenzae NOT DETECTED NOT DETECTED Final   Neisseria meningitidis  NOT DETECTED NOT DETECTED Final   Pseudomonas aeruginosa NOT DETECTED NOT DETECTED Final   Stenotrophomonas maltophilia NOT DETECTED NOT DETECTED Final   Candida albicans NOT DETECTED NOT DETECTED Final   Candida auris NOT DETECTED NOT DETECTED Final   Candida glabrata NOT DETECTED NOT DETECTED Final   Candida krusei NOT DETECTED NOT DETECTED Final   Candida parapsilosis NOT DETECTED NOT DETECTED Final   Candida tropicalis NOT DETECTED NOT DETECTED Final   Cryptococcus neoformans/gattii NOT DETECTED NOT DETECTED Final    Comment: Performed at [facility], [place] [address], [place], [place] [address]     Radiology Studies: CT ABDOMEN PELVIS WO CONTRAST  Result Date: 09/23/2019 CLINICAL DATA:  Nausea and vomiting. Weakness and hypertension for 1 week. History of pancreatic cancer. EXAM: CT ABDOMEN AND PELVIS WITHOUT CONTRAST TECHNIQUE: Multidetector CT imaging of the abdomen and pelvis was performed following the standard protocol without IV contrast. COMPARISON:  08/10/2019 MRI.  CT of 05/13/2019 FINDINGS: Lower chest: Clear lung bases. Normal heart size without pericardial or pleural effusion. Hepatobiliary: Limited evaluation for focal liver lesion. Common duct stent in place, with pneumobilia suggesting stent patency. Gallstones. Mild gallbladder distension with pericholecystic edema, including on 34/3. Pancreas: The pancreatic primary is poorly evaluated on this noncontrast exam. No gross pancreatic duct dilatation. No convincing evidence of pancreatitis. Spleen: Normal in size, without focal abnormality. Adrenals/Urinary Tract: Normal adrenal glands. No renal calculi or  hydronephrosis. 2.0 cm interpolar left renal low-density lesion is likely a cyst. Mild right renal cortical thinning. No renal calculi or hydronephrosis. No bladder calculi. Stomach/Bowel: Normal stomach, without wall thickening. Scattered colonic diverticula. Normal terminal ileum and appendix. Normal small bowel. Vascular/Lymphatic: No gross arterial encasement. No abdominopelvic adenopathy. An 11 mm upper normal portal caval node is similar to 12 mm on the prior. 21/3 today. Reproductive: Normal prostate. Other: No significant free fluid. No evidence of omental or peritoneal disease. Musculoskeletal: Prior trauma or surgery involving right iliac crest. Mild wedging of the T12 vertebral body, chronic. IMPRESSION: 1. Findings highly suspicious for acute cholecystitis. Gallbladder distension and pericholecystic edema in the setting of gallstones. Depending on clinical concern, ultrasound could confirm. 2. Suboptimal evaluation of the pancreatic primary secondary to noncontrast technique. Common duct stent in place with pneumobilia. 3.  Aortic Atherosclerosis (ICD10-I70.0). Electronically Signed   By: Abigail Miyamoto M.D.   On: 09/23/2019 11:05   DG Chest Portable 1 View  Result Date: 09/23/2019 CLINICAL DATA:  Cough.  History of pancreatic cancer EXAM: PORTABLE CHEST 1 VIEW COMPARISON:  08/08/2019 FINDINGS: The heart size and mediastinal contours are within normal limits. Both lungs are clear. The visualized skeletal structures are unremarkable. Port-A-Cath tip in the lower SVC unchanged from the prior study. IMPRESSION: No active disease. Electronically Signed   By: Franchot Gallo M.D.   On: 09/23/2019 12:38   DG ERCP BILIARY & PANCREATIC DUCTS  Result Date: 09/24/2019 CLINICAL DATA:  Pancreas cancer, biliary obstruction occluded stent EXAM: ERCP balloon sweep and stent exchange TECHNIQUE: Multiple spot images obtained with the fluoroscopic device and submitted for interpretation post-procedure. FLUOROSCOPY  TIME:  Fluoroscopy Time:  2 minutes 29 seconds COMPARISON:  09/23/2019 CT FINDINGS: Limited ERCP demonstrates removal of the covered biliary stent. Balloon sweep performed. This was followed by placement of a new metallic self expanding biliary stent in a similar position. Final image demonstrates residual pneumobilia with decompression of the biliary tree. IMPRESSION: Limited imaging during ERCP with distal CBD stent exchanged These images were submitted for radiologic  interpretation only. Please see the procedural report for the amount of contrast and the fluoroscopy time utilized. Electronically Signed   By: Jerilynn Mages.  Shick M.D.   On: 09/24/2019 11:57   US Abdomen Limited RUQ  Result Date: 09/23/2019 CLINICAL DATA:  Nausea and vomiting. History of pancreatic cancer and biliary stent EXAM: ULTRASOUND ABDOMEN LIMITED RIGHT UPPER QUADRANT COMPARISON:  CT 09/23/2019 FINDINGS: Gallbladder: Gallbladder lumen is completely filled with heterogeneous sludge and small shadowing stones. Irregular wall thickening with associated pericholecystic fluid. No sonographic Murphy sign noted by sonographer. Common bile duct: Diameter: 4 mm.  Pneumobilia noted. Liver: Reverberation artifact within the intrahepatic biliary ducts compatible with pneumobilia. No focal lesion identified. Within normal limits in parenchymal echogenicity. Portal vein is patent on color Doppler imaging with normal direction of blood flow towards the liver. Other: None. IMPRESSION: 1. Abnormal appearance of the gallbladder which is distended by sludge and small stones. Irregular gallbladder wall thickening and pericholecystic edema. Findings are suspicious for acute cholecystitis. 2. Pneumobilia. Electronically Signed   By: Davina Poke D.O.   On: 09/23/2019 11:43    Scheduled Meds: . Chlorhexidine Gluconate Cloth  6 each Topical Daily  . lip balm  1 application Topical BID  . sodium chloride flush  10-40 mL Intracatheter Q12H   Continuous  Infusions: . methocarbamol (ROBAXIN) IV    . ondansetron (ZOFRAN) IV    . piperacillin-tazobactam (ZOSYN)  IV 3.375 g (09/25/19 0315)     LOS: 2 days    Time spent: 25 mins.    Shawna Clamp, MD [facility]   If 7PM-7AM, please contact night-coverage

## 2019-09-25 NOTE — Progress Notes (Signed)
   09/25/19 2013  Assess: MEWS Score  Temp 98 F (36.7 C)  BP 102/71  Pulse Rate 72  Resp 20  SpO2 100 %  Assess: MEWS Score  MEWS Temp 0  MEWS Systolic 0  MEWS Pulse 0  MEWS RR 0  MEWS LOC 0  MEWS Score 0  MEWS Score Color Green  Updated vitals taken at writers request. See note attached

## 2019-09-25 NOTE — Progress Notes (Signed)
[facility] Progress Note  CC:  Pancreatic cancer, malignant biliary stricture   Subjective: His abdominal pain is less today. He stated feeling much better today. No N/V. He is tolerating a clear liquid diet this morning. No BM x 3 days. No family at the bedside at this time.    Objective:   S/P ERCP 09/24/2019: - Deformed duodenal bulb due to extrinsic compression from pancreatic tumor. - Prior sphincterotomy. - Moderate CBD sludge and pneumobilia. - A single localized biliary stricture was found in the common bile duct. The stricture was malignant appearing. - The biliary tree proximal to the stricture was dilated, with a stricture/mass causing an obstruction. - One stent was exchanged in the common bile duct with an uncovered metal stent placed. - The biliary tree was swept and sludge was found and removed. - The cystic duct and gallbladder were not filled.  Right upper quadrant ultrasound 09/23/2019: 1. Abnormal appearance of the gallbladder which is distended by sludge and small stones. Irregular gallbladder wall thickening and pericholecystic edema. Findings are suspicious for acute cholecystitis. 2. Pneumobilia.  Abdominal/pelvic CT with out contrast 09/23/2019: 1. Findings highly suspicious for acute cholecystitis. Gallbladder distension and pericholecystic edema in the setting of gallstones. Depending on clinical concern, ultrasound could confirm. 2. Suboptimal evaluation of the pancreatic primary secondary to noncontrast technique. Common duct stent in place with pneumobilia. 3. Aortic Atherosclerosis    ERCP 06/03/2019 by Dr. 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  EUS 05/19/2019: - Large mass involving much of the pancreatic parenchyma, clear encasing the PV and possibly involving the SMA as well. The mass was sampled with 3 transduodenal EUS FNB passes and the preliminary cytology was positive for  malignancy (adenocarcinoma). - Previously placed plastic biliary stent was in the CBD in good position.  ERCP 05/11/2019 by Dr. Henrene Pastor:  1. Malignant appearing distal bile duct stricture with upstream dilation. Status post ERCP with sphincterotomy and biliary stent placement   Vital signs in last 24 hours: Temp:  [97.4 F (36.3 C)-97.9 F (36.6 C)] 97.9 F (36.6 C) (08/22 2130) Pulse Rate:  [39-85] 72 (08/22 0614) Resp:  [18-23] 20 (08/22 0614) BP: (88-110)/(54-80) 98/67 (08/22 8657) SpO2:  [98 %-100 %] 100 % (08/22 0614) Last BM Date: 09/23/19 General: Thin 64 year old male sitting up in the bed in NAD. Heart: RRR, no murmur.  Pulm:  Breath sounds clear throughout.  Abdomen: Soft, nondistended, mild epigastric and RUQ tenderness without rebound or guarding. + BS x 4 quads. No HSM.  GU: urine is dark amber color.  Extremities:  Without edema. Neurologic:  Alert and  oriented x4;  grossly normal neurologically. Psych:  Alert and cooperative. Normal mood and affect.  Intake/Output from previous day: 08/21 0701 - 08/22 0700 In: 1131.9 [I.V.:500; IV Piggyback:631.9] Out: -  Intake/Output this shift: No intake/output data recorded.  Lab Results: Recent Labs    09/23/19 0930 09/24/19 0418 09/25/19 0443  WBC 58.2* 26.0* 18.1*  HGB 8.6* 8.8* 7.4*  HCT 26.3* 26.9* 22.4*  PLT 172 129* 108*   BMET Recent Labs    09/23/19 0930 09/24/19 0418 09/25/19 0443  NA 133* 136 136  K 3.5 4.2 4.3  CL 100 108 108  CO2 19* 16* 17*  GLUCOSE 145* 105* 201*  BUN 17 25* 38*  CREATININE 2.02* 1.87* 2.10*  CALCIUM 8.6* 7.2* 7.3*   LFT Recent Labs    09/23/19 0930 09/24/19 0418 09/25/19 0443  PROT  6.6   < > 5.4*  ALBUMIN 2.6*   < > 2.4*  AST 35   < > 23  ALT 24   < > 20  ALKPHOS 292*   < > 112  BILITOT 5.8*   < > 4.2*  BILIDIR 3.9*  --   --   IBILI 1.9*  --   --    < > = values in this interval not displayed.   PT/INR Recent Labs    09/23/19 0930 09/24/19 0418  LABPROT  16.4* 19.3*  INR 1.4* 1.7*   Hepatitis Panel No results for input(s): HEPBSAG, HCVAB, HEPAIGM, HEPBIGM in the last 72 hours.  CT ABDOMEN PELVIS WO CONTRAST  Result Date: 09/23/2019 CLINICAL DATA:  Nausea and vomiting. Weakness and hypertension for 1 week. History of pancreatic cancer. EXAM: CT ABDOMEN AND PELVIS WITHOUT CONTRAST TECHNIQUE: Multidetector CT imaging of the abdomen and pelvis was performed following the standard protocol without IV contrast. COMPARISON:  08/10/2019 MRI.  CT of 05/13/2019 FINDINGS: Lower chest: Clear lung bases. Normal heart size without pericardial or pleural effusion. Hepatobiliary: Limited evaluation for focal liver lesion. Common duct stent in place, with pneumobilia suggesting stent patency. Gallstones. Mild gallbladder distension with pericholecystic edema, including on 34/3. Pancreas: The pancreatic primary is poorly evaluated on this noncontrast exam. No gross pancreatic duct dilatation. No convincing evidence of pancreatitis. Spleen: Normal in size, without focal abnormality. Adrenals/Urinary Tract: Normal adrenal glands. No renal calculi or hydronephrosis. 2.0 cm interpolar left renal low-density lesion is likely a cyst. Mild right renal cortical thinning. No renal calculi or hydronephrosis. No bladder calculi. Stomach/Bowel: Normal stomach, without wall thickening. Scattered colonic diverticula. Normal terminal ileum and appendix. Normal small bowel. Vascular/Lymphatic: No gross arterial encasement. No abdominopelvic adenopathy. An 11 mm upper normal portal caval node is similar to 12 mm on the prior. 21/3 today. Reproductive: Normal prostate. Other: No significant free fluid. No evidence of omental or peritoneal disease. Musculoskeletal: Prior trauma or surgery involving right iliac crest. Mild wedging of the T12 vertebral body, chronic. IMPRESSION: 1. Findings highly suspicious for acute cholecystitis. Gallbladder distension and pericholecystic edema in the setting  of gallstones. Depending on clinical concern, ultrasound could confirm. 2. Suboptimal evaluation of the pancreatic primary secondary to noncontrast technique. Common duct stent in place with pneumobilia. 3.  Aortic Atherosclerosis (ICD10-I70.0). Electronically Signed   By: Abigail Miyamoto M.D.   On: 09/23/2019 11:05   DG Chest Portable 1 View  Result Date: 09/23/2019 CLINICAL DATA:  Cough.  History of pancreatic cancer EXAM: PORTABLE CHEST 1 VIEW COMPARISON:  08/08/2019 FINDINGS: The heart size and mediastinal contours are within normal limits. Both lungs are clear. The visualized skeletal structures are unremarkable. Port-A-Cath tip in the lower SVC unchanged from the prior study. IMPRESSION: No active disease. Electronically Signed   By: Franchot Gallo M.D.   On: 09/23/2019 12:38   DG ERCP BILIARY & PANCREATIC DUCTS  Result Date: 09/24/2019 CLINICAL DATA:  Pancreas cancer, biliary obstruction occluded stent EXAM: ERCP balloon sweep and stent exchange TECHNIQUE: Multiple spot images obtained with the fluoroscopic device and submitted for interpretation post-procedure. FLUOROSCOPY TIME:  Fluoroscopy Time:  2 minutes 29 seconds COMPARISON:  09/23/2019 CT FINDINGS: Limited ERCP demonstrates removal of the covered biliary stent. Balloon sweep performed. This was followed by placement of a new metallic self expanding biliary stent in a similar position. Final image demonstrates residual pneumobilia with decompression of the biliary tree. IMPRESSION: Limited imaging during ERCP with distal CBD stent exchanged These images  were submitted for radiologic interpretation only. Please see the procedural report for the amount of contrast and the fluoroscopy time utilized. Electronically Signed   By: Jerilynn Mages.  Shick M.D.   On: 09/24/2019 11:57   US Abdomen Limited RUQ  Result Date: 09/23/2019 CLINICAL DATA:  Nausea and vomiting. History of pancreatic cancer and biliary stent EXAM: ULTRASOUND ABDOMEN LIMITED RIGHT UPPER  QUADRANT COMPARISON:  CT 09/23/2019 FINDINGS: Gallbladder: Gallbladder lumen is completely filled with heterogeneous sludge and small shadowing stones. Irregular wall thickening with associated pericholecystic fluid. No sonographic Murphy sign noted by sonographer. Common bile duct: Diameter: 4 mm.  Pneumobilia noted. Liver: Reverberation artifact within the intrahepatic biliary ducts compatible with pneumobilia. No focal lesion identified. Within normal limits in parenchymal echogenicity. Portal vein is patent on color Doppler imaging with normal direction of blood flow towards the liver. Other: None. IMPRESSION: 1. Abnormal appearance of the gallbladder which is distended by sludge and small stones. Irregular gallbladder wall thickening and pericholecystic edema. Findings are suspicious for acute cholecystitis. 2. Pneumobilia. Electronically Signed   By: Davina Poke D.O.   On: 09/23/2019 11:43    Assessment / Plan:  62. 64 year old male with pancreatic adenocarcinoma in the head/neck/body with a malignant biliary stricture. Patient was readmitted to the hospital 8/20 with RUQ pain and elevated Alk phos and T. Bili levels.  Leukocytosis with WBC 26 concerning for ascending cholangitis. S/P repeat ERCP with stent exchange (uncovered metal stent) to the common bile duct on 8/21. The biliary tree was swept and sludge removed. He remains on Zosyn. He is afebrile. WBC 26 -> 18.1. Alk phos and T. Bili levels drifting downward. Alk phos 180 ->112. AST 25 -> 23. ALT 19 ->20. AST 25 -> 23. ALT 19 ->20. -Continue Zosyn IV 3.338m IV Q 8 hrs -Repeat hepatic panel in am -Zofran 480mpo or IV Q 6hrs PRN -Pain management per the hospitalist -Consider nutritionist consult   2. AKI. Cr. Rising 1.87 -> 2.10. -IVF per the hospitalist -Repeat BMP in am  3. Anemia. Hg 8.8 -> 7.4. Last transfused 1 unit of PRBCs by oncology on 8/2 for Hg 7.6. -defer decision to transfuse PRBC to oncology and hospitalist   4.  Thrombocytopenia secondary to # 1 and chemo  5.  Coagulopathy. INR 1.7 on 8/21 -Repeat INR in am  6. Constipation -Dulcolax suppository tonight if no BM today -Miralax Q HS PRN  Further recommendations per Dr. PyHilarie Fredrickson        Principal Problem:   Cholangitis Active Problems:   ARF (acute renal failure) (HCC)   Hypokalemia   Malignant biliary obstruction (HCC)   Malignant neoplasm of pancreas (HCC)   Malignant obstructive jaundice (HCC)   Symptomatic anemia   Port-A-Cath in place   Severe sepsis with acute organ dysfunction (HCC)   Common bile duct obstruction s/p metal biliary stent 06/2019   Anemia of chronic disease   Elevated LFTs   RUQ abdominal pain     LOS: 2 days   CoPatrecia Pourennedy-Smith  09/25/2019, 9:01 AM

## 2019-09-25 NOTE — Progress Notes (Signed)
PHARMACY - PHYSICIAN COMMUNICATION CRITICAL VALUE ALERT - BLOOD CULTURE IDENTIFICATION (BCID)  Isaac Doyle is an 64 y.o. male who presented to [facility] on 09/23/2019 with a chief complaint of acute cholecystitis  Assessment:  1 of 2 sets from [facility] growing [facility]  Name of physician (or Provider) Contacted: Dwyane Dee  Current antibiotics: Zosyn  Changes to prescribed antibiotics recommended:  none  Results for orders placed or performed during the hospital encounter of 09/23/19  Blood Culture ID Panel (Reflexed) (Collected: 09/23/2019  9:30 AM)  Result Value Ref Range   Enterococcus faecalis NOT DETECTED NOT DETECTED   Enterococcus Faecium NOT DETECTED NOT DETECTED   Listeria monocytogenes NOT DETECTED NOT DETECTED   Staphylococcus species NOT DETECTED NOT DETECTED   Staphylococcus aureus (BCID) NOT DETECTED NOT DETECTED   Staphylococcus epidermidis NOT DETECTED NOT DETECTED   Staphylococcus lugdunensis NOT DETECTED NOT DETECTED   Streptococcus species NOT DETECTED NOT DETECTED   Streptococcus agalactiae NOT DETECTED NOT DETECTED   Streptococcus pneumoniae NOT DETECTED NOT DETECTED   Streptococcus pyogenes NOT DETECTED NOT DETECTED   A.calcoaceticus-baumannii NOT DETECTED NOT DETECTED   Bacteroides fragilis NOT DETECTED NOT DETECTED   Enterobacterales NOT DETECTED NOT DETECTED   Enterobacter cloacae complex NOT DETECTED NOT DETECTED   Escherichia coli NOT DETECTED NOT DETECTED   Klebsiella aerogenes NOT DETECTED NOT DETECTED   Klebsiella oxytoca NOT DETECTED NOT DETECTED   Klebsiella pneumoniae NOT DETECTED NOT DETECTED   Proteus species NOT DETECTED NOT DETECTED   Salmonella species NOT DETECTED NOT DETECTED   Serratia marcescens NOT DETECTED NOT DETECTED   Haemophilus influenzae NOT DETECTED NOT DETECTED   Neisseria meningitidis NOT DETECTED NOT DETECTED   Pseudomonas aeruginosa NOT DETECTED NOT DETECTED   Stenotrophomonas maltophilia NOT DETECTED NOT DETECTED   Candida  albicans NOT DETECTED NOT DETECTED   Candida auris NOT DETECTED NOT DETECTED   Candida glabrata NOT DETECTED NOT DETECTED   Candida krusei NOT DETECTED NOT DETECTED   Candida parapsilosis NOT DETECTED NOT DETECTED   Candida tropicalis NOT DETECTED NOT DETECTED   Cryptococcus neoformans/gattii NOT DETECTED NOT DETECTED    Isaac Doyle 09/25/2019  8:41 AM

## 2019-09-25 NOTE — Progress Notes (Signed)
Given findings at ERCP, I do not think this is related to his gallbladder and see no role for surgical intervention at this time.

## 2019-09-25 NOTE — Progress Notes (Signed)
   09/25/19 1919  Assess: MEWS Score  Temp (!) 96.6 F (35.9 C) (incorrect reading writer had NT retake)  BP 100/74  Pulse Rate 76  Resp 19  SpO2 100 %  O2 Device Nasal Cannula  Patient Activity (if Appropriate) In bed  O2 Flow Rate (L/min) 2 L/min  Assess: MEWS Score  MEWS Temp 1  MEWS Systolic 1  MEWS Pulse 0  MEWS RR 0  MEWS LOC 0  MEWS Score 2  MEWS Score Color Yellow  This shift NT  Reported that pt is in yellow and no previous staff member reported off on yellow MEWS to NT or myself. I believed the pt was drinking ice water at the time and the vitals/ temp was skewed. Requested a retake of pts vitals and temp was 98 bringing MEWS to green. 96.6 temp taken at 1919 was inaccurate .

## 2019-09-26 ENCOUNTER — Inpatient Hospital Stay (HOSPITAL_COMMUNITY): Payer: No Typology Code available for payment source

## 2019-09-26 DIAGNOSIS — R6520 Severe sepsis without septic shock: Secondary | ICD-10-CM

## 2019-09-26 DIAGNOSIS — A419 Sepsis, unspecified organism: Principal | ICD-10-CM

## 2019-09-26 DIAGNOSIS — C250 Malignant neoplasm of head of pancreas: Secondary | ICD-10-CM

## 2019-09-26 LAB — HEMOGLOBIN AND HEMATOCRIT, BLOOD
HCT: 26 % — ABNORMAL LOW (ref 39.0–52.0)
Hemoglobin: 8.4 g/dL — ABNORMAL LOW (ref 13.0–17.0)

## 2019-09-26 LAB — COMPREHENSIVE METABOLIC PANEL
ALT: 20 U/L (ref 0–44)
AST: 22 U/L (ref 15–41)
Albumin: 2.1 g/dL — ABNORMAL LOW (ref 3.5–5.0)
Alkaline Phosphatase: 101 U/L (ref 38–126)
Anion gap: 11 (ref 5–15)
BUN: 47 mg/dL — ABNORMAL HIGH (ref 8–23)
CO2: 18 mmol/L — ABNORMAL LOW (ref 22–32)
Calcium: 7.6 mg/dL — ABNORMAL LOW (ref 8.9–10.3)
Chloride: 108 mmol/L (ref 98–111)
Creatinine, Ser: 2.16 mg/dL — ABNORMAL HIGH (ref 0.61–1.24)
GFR calc Af Amer: 36 mL/min — ABNORMAL LOW (ref >60)
GFR calc non Af Amer: 31 mL/min — ABNORMAL LOW (ref >60)
Glucose, Bld: 134 mg/dL — ABNORMAL HIGH (ref 70–99)
Potassium: 3.9 mmol/L (ref 3.5–5.1)
Sodium: 137 mmol/L (ref 135–145)
Total Bilirubin: 2.2 mg/dL — ABNORMAL HIGH (ref 0.3–1.2)
Total Protein: 5.4 g/dL — ABNORMAL LOW (ref 6.5–8.1)

## 2019-09-26 LAB — TYPE AND SCREEN
ABO/RH(D): A POS
Antibody Screen: NEGATIVE
Unit division: 0

## 2019-09-26 LAB — CBC
HCT: 25.3 % — ABNORMAL LOW (ref 39.0–52.0)
Hemoglobin: 8.2 g/dL — ABNORMAL LOW (ref 13.0–17.0)
MCH: 26.7 pg (ref 26.0–34.0)
MCHC: 32.4 g/dL (ref 30.0–36.0)
MCV: 82.4 fL (ref 80.0–100.0)
Platelets: 108 10*3/uL — ABNORMAL LOW (ref 150–400)
RBC: 3.07 MIL/uL — ABNORMAL LOW (ref 4.22–5.81)
RDW: 17.1 % — ABNORMAL HIGH (ref 11.5–15.5)
WBC: 18.2 10*3/uL — ABNORMAL HIGH (ref 4.0–10.5)
nRBC: 0 % (ref 0.0–0.2)

## 2019-09-26 LAB — MAGNESIUM: Magnesium: 2.1 mg/dL (ref 1.7–2.4)

## 2019-09-26 LAB — BPAM RBC
Blood Product Expiration Date: 202109112359
ISSUE DATE / TIME: 202108221852
Unit Type and Rh: 6200

## 2019-09-26 LAB — PHOSPHORUS: Phosphorus: 3.6 mg/dL (ref 2.5–4.6)

## 2019-09-26 MED ORDER — SODIUM CHLORIDE 0.9 % IV SOLN: INTRAVENOUS | Status: AC | PRN

## 2019-09-26 MED ORDER — SODIUM BICARBONATE 650 MG PO TABS
650.0000 mg | ORAL_TABLET | Freq: Three times a day (TID) | ORAL | Status: AC
  Administered 2019-09-26 – 2019-09-28 (×6): 650 mg via ORAL
  Filled 2019-09-26 (×6): qty 1

## 2019-09-26 NOTE — Progress Notes (Addendum)
HEMATOLOGY-ONCOLOGY PROGRESS NOTE  SUBJECTIVE: The patient reports that he feels much better this morning.  Underwent ERCP with metal stent placement on 09/24/2019.  Operative report indicates that the no bulb was deformed due to extrinsic compression, distal CBD stricture, indwelling covered metal biliary stent partially occluded and was removed and replaced with an uncovered metal biliary stent.  Patient reports that his abdominal pain has resolved.  He is not having any nausea or vomiting.  Tolerating a full liquid diet.  Denies fevers and chills.  Blood culture from 09/23/2019 grew gram-positive cocci in the anaerobic bottle only.  He remains on IV antibiotics.  Oncology History Overview Note  Cancer Staging Malignant neoplasm of pancreas [facility]) Staging form: Exocrine Pancreas, AJCC 8th Edition - Clinical stage from 05/19/2019: Stage III (cT4, cN1, cM0) - Signed by Truitt Merle, MD on 05/24/2019    Malignant neoplasm of pancreas ([place])  05/10/2019 Tumor Marker   Baseline  CEA at 31.1 Ca 19-9 at 2411   05/11/2019 Procedure   ERCP by Dr Henrene Pastor 05/11/19  IMPRESSION 1. Malignant appearing distal bile duct stricture with upstream dilation. Status post ERCP with sphincterotomy and biliary stent placement   05/13/2019 Imaging   CT Chest and Pancreas 05/13/19 IMPRESSION: 1. Interval placement of common bile duct stent with decompression of the bile ducts. Pneumobilia is now noted compatible with biliary patency. 2. Diffuse infiltrative process involving the head, neck, body and tail of pancreas is identified. The diffusely infiltrative appearance of the pancreas is somewhat unusual. Although favored to represent pancreatic adenocarcinoma, other etiologies to consider include IgG4-related Sclerosing Disease of the pancreas. 3. There is encasement and narrowing of the portal venous confluence and proximal portal vein. Mild soft tissue stranding extends to but does not encase the superior mesenteric artery.  No convincing evidence for involvement of the celiac trunk. 4. Borderline enlarged portacaval node. No convincing evidence for liver metastasis or metastatic disease to the chest. 5. Aortic atherosclerosis. Aortic Atherosclerosis (ICD10-I70.0).   05/19/2019 Initial Diagnosis   Malignant neoplasm of pancreas ([place])   05/19/2019 Cancer Staging   Staging form: Exocrine Pancreas, AJCC 8th Edition - Clinical stage from 05/19/2019: Stage III (cT4, cN1, cM0) - Signed by Truitt Merle, MD on 05/24/2019   05/19/2019 Procedure   EUS by Dr Ardis Hughs 05/19/19 - Large mass involving much of the pancreatic parenchyma, clear encasing the PV and possibly involving the SMA as well. The mass was sampled with 3 transduodenal EUS FNB passes and the preliminary cytology was positive for malignancy (adenocarcinoma). - Previously placed plastic biliary stent was in the CBD in good position.    05/19/2019 Initial Biopsy   A. 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   06/08/2019 Procedure   PAC placed    06/16/2019 -  Chemotherapy   FOLFIRINOX q2weeks starting 06/16/19   08/04/2019 Imaging   US Abdomen  IMPRESSION: 1. Gallbladder mildly distended with sludge and tiny gallstones. No gallbladder wall thickening or pericholecystic fluid.   2. Biliary stent present with pneumobilia, likely due to stent present.   3. Much of pancreas obscured by gas. Visualized portions of pancreas appear grossly unremarkable.   4. Increased renal echogenicity, likely indicative of medical renal disease. No obstructing focus in either kidney. Cysts noted in each kidney.   08/10/2019 Imaging  MRI  IMPRESSION: 1. Infiltrative hypoenhancement in the pancreatic body, most of the pancreatic tail, and extending into a  significant portion of the pancreatic head. This appearance in combination with the abnormal cytology on prior FNA is suspicious for an infiltrative pancreatic cancer involving most of the parenchyma of the pancreas, with sparing of the tip of the pancreatic tail and a small portion of the pancreatic head. 2. The common hepatic duct and common bile duct is obscured by low signal over an approximately 4.7 cm segment, although the lack of intrahepatic biliary dilatation suggests that the stent is still in place and functional. 3. Cholelithiasis with mild gallbladder wall thickening. There is some accentuation of enhancement along the gallbladder fossa which can sometimes correlate with gallbladder inflammation causing local hyperemia. 4. Trace ascites.      REVIEW OF SYSTEMS:   Constitutional: Denies fevers, chills Respiratory: Denies cough, dyspnea or wheezes Cardiovascular: Denies palpitation, chest discomfort Gastrointestinal: Abdominal pain has resolved, denies nausea and vomiting Skin: Denies abnormal skin rashes Lymphatics: Denies new lymphadenopathy or easy bruising Neurological:Denies numbness, tingling or new weaknesses Behavioral/Psych: Mood is stable, no new changes  Extremities: No lower extremity edema All other systems were reviewed with the patient and are negative.  I have reviewed the past medical history, past surgical history, social history and family history with the patient and they are unchanged from previous note.   PHYSICAL EXAMINATION: ECOG PERFORMANCE STATUS: 1 - Symptomatic but completely ambulatory  Vitals:   09/25/19 2213 09/26/19 0512  BP: 111/74 110/71  Pulse: 74 70  Resp: 20 19  Temp: 97.7 F (36.5 C) (!) 97.5 F (36.4 C)  SpO2: 100% 100%   Filed Weights   09/23/19 0849  Weight: 69.4 kg    Intake/Output from previous day: 08/22 0701 - 08/23 0700 In: 1512.8 [P.O.:740; I.V.:260; Blood:412.8; IV Piggyback:100] Out: 400  [Urine:400]  GENERAL: Awake and alert, no distress SKIN: skin color, texture, turgor are normal, no rashes or significant lesions EYES: Mild scleral icterus OROPHARYNX: No thrush or mucositis LUNGS: clear to auscultation and percussion with normal breathing effort HEART: regular rate & rhythm and no murmurs and no lower extremity edema ABDOMEN: Positive bowel sounds, soft, nontender NEURO: alert & oriented x 3 with fluent speech, no focal motor/sensory deficits  LABORATORY DATA:  I have reviewed the data as listed CMP Latest Ref Rng & Units 09/25/2019 09/24/2019 09/23/2019  Glucose 70 - 99 mg/dL 201(H) 105(H) 145(H)  BUN 8 - 23 mg/dL 38(H) 25(H) 17  Creatinine 0.61 - 1.24 mg/dL 2.10(H) 1.87(H) 2.02(H)  Sodium 135 - 145 mmol/L 136 136 133(L)  Potassium 3.5 - 5.1 mmol/L 4.3 4.2 3.5  Chloride 98 - 111 mmol/L 108 108 100  CO2 22 - 32 mmol/L 17(L) 16(L) 19(L)  Calcium 8.9 - 10.3 mg/dL 7.3(L) 7.2(L) 8.6(L)  Total Protein 6.5 - 8.1 g/dL 5.4(L) 5.6(L) 6.6  Total Bilirubin 0.3 - 1.2 mg/dL 4.2(H) 5.8(H) 5.8(H)  Alkaline Phos 38 - 126 U/L 112 180(H) 292(H)  AST 15 - 41 U/L 23 25 35  ALT 0 - 44 U/L 20 19 24     Lab Results  Component Value Date   WBC 18.2 (H) 09/26/2019   HGB 8.2 (L) 09/26/2019   HCT 25.3 (L) 09/26/2019   MCV 82.4 09/26/2019   PLT 108 (L) 09/26/2019   NEUTROABS 52.2 (H) 09/23/2019    CT ABDOMEN PELVIS WO CONTRAST  Result Date: 09/23/2019 CLINICAL DATA:  Nausea and vomiting. Weakness and hypertension for 1 week. History  of pancreatic cancer. EXAM: CT ABDOMEN AND PELVIS WITHOUT CONTRAST TECHNIQUE: Multidetector CT imaging of the abdomen and pelvis was performed following the standard protocol without IV contrast. COMPARISON:  08/10/2019 MRI.  CT of 05/13/2019 FINDINGS: Lower chest: Clear lung bases. Normal heart size without pericardial or pleural effusion. Hepatobiliary: Limited evaluation for focal liver lesion. Common duct stent in place, with pneumobilia suggesting stent  patency. Gallstones. Mild gallbladder distension with pericholecystic edema, including on 34/3. Pancreas: The pancreatic primary is poorly evaluated on this noncontrast exam. No gross pancreatic duct dilatation. No convincing evidence of pancreatitis. Spleen: Normal in size, without focal abnormality. Adrenals/Urinary Tract: Normal adrenal glands. No renal calculi or hydronephrosis. 2.0 cm interpolar left renal low-density lesion is likely a cyst. Mild right renal cortical thinning. No renal calculi or hydronephrosis. No bladder calculi. Stomach/Bowel: Normal stomach, without wall thickening. Scattered colonic diverticula. Normal terminal ileum and appendix. Normal small bowel. Vascular/Lymphatic: No gross arterial encasement. No abdominopelvic adenopathy. An 11 mm upper normal portal caval node is similar to 12 mm on the prior. 21/3 today. Reproductive: Normal prostate. Other: No significant free fluid. No evidence of omental or peritoneal disease. Musculoskeletal: Prior trauma or surgery involving right iliac crest. Mild wedging of the T12 vertebral body, chronic. IMPRESSION: 1. Findings highly suspicious for acute cholecystitis. Gallbladder distension and pericholecystic edema in the setting of gallstones. Depending on clinical concern, ultrasound could confirm. 2. Suboptimal evaluation of the pancreatic primary secondary to noncontrast technique. Common duct stent in place with pneumobilia. 3.  Aortic Atherosclerosis (ICD10-I70.0). Electronically Signed   By: Abigail Miyamoto M.D.   On: 09/23/2019 11:05   DG Chest Portable 1 View  Result Date: 09/23/2019 CLINICAL DATA:  Cough.  History of pancreatic cancer EXAM: PORTABLE CHEST 1 VIEW COMPARISON:  08/08/2019 FINDINGS: The heart size and mediastinal contours are within normal limits. Both lungs are clear. The visualized skeletal structures are unremarkable. Port-A-Cath tip in the lower SVC unchanged from the prior study. IMPRESSION: No active disease.  Electronically Signed   By: Franchot Gallo M.D.   On: 09/23/2019 12:38   DG ERCP BILIARY & PANCREATIC DUCTS  Result Date: 09/24/2019 CLINICAL DATA:  Pancreas cancer, biliary obstruction occluded stent EXAM: ERCP balloon sweep and stent exchange TECHNIQUE: Multiple spot images obtained with the fluoroscopic device and submitted for interpretation post-procedure. FLUOROSCOPY TIME:  Fluoroscopy Time:  2 minutes 29 seconds COMPARISON:  09/23/2019 CT FINDINGS: Limited ERCP demonstrates removal of the covered biliary stent. Balloon sweep performed. This was followed by placement of a new metallic self expanding biliary stent in a similar position. Final image demonstrates residual pneumobilia with decompression of the biliary tree. IMPRESSION: Limited imaging during ERCP with distal CBD stent exchanged These images were submitted for radiologic interpretation only. Please see the procedural report for the amount of contrast and the fluoroscopy time utilized. Electronically Signed   By: Jerilynn Mages.  Shick M.D.   On: 09/24/2019 11:57   US Abdomen Limited RUQ  Result Date: 09/23/2019 CLINICAL DATA:  Nausea and vomiting. History of pancreatic cancer and biliary stent EXAM: ULTRASOUND ABDOMEN LIMITED RIGHT UPPER QUADRANT COMPARISON:  CT 09/23/2019 FINDINGS: Gallbladder: Gallbladder lumen is completely filled with heterogeneous sludge and small shadowing stones. Irregular wall thickening with associated pericholecystic fluid. No sonographic Murphy sign noted by sonographer. Common bile duct: Diameter: 4 mm.  Pneumobilia noted. Liver: Reverberation artifact within the intrahepatic biliary ducts compatible with pneumobilia. No focal lesion identified. Within normal limits in parenchymal echogenicity. Portal vein is patent on color Doppler imaging  with normal direction of blood flow towards the liver. Other: None. IMPRESSION: 1. Abnormal appearance of the gallbladder which is distended by sludge and small stones. Irregular  gallbladder wall thickening and pericholecystic edema. Findings are suspicious for acute cholecystitis. 2. Pneumobilia. Electronically Signed   By: Davina Poke D.O.   On: 09/23/2019 11:43    ASSESSMENT AND PLAN: 1.  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  Malignant biliary stricture 3.  Acute cholangitis with positive blood cultures 4.  Leukocytosis 5.  Anemia 6.  Hyperbilirubinemia 7.  Protein calorie malnutrition/weight loss 8.  Alcohol abuse 9.  Hypertension 10.  History of severe trauma with disability  -The patient has been receiving chemotherapy for his pancreatic cancer.  Last cycle was held due to anemia.  We will discuss further chemotherapy as an outpatient. -The patient underwent ERCP with stent placement this weekend.  His abdominal pain has resolved and he is feeling much better.  Awaiting follow-up CMP from this morning.  GI is advancing to a full liquid diet today.  GI recommends continuation of IV antibiotics and then transition to oral antibiotics.  Acute cholecystitis has been ruled out and general surgery has signed off. -Leukocytosis secondary to biliary obstruction.  WBC is trending downward. -Hemoglobin stable at this time.  Monitor hemoglobin transfuse for hemoglobin less than 7. -Monitor T bili and LFTs closely. -Recommend dietitian consult for malnutrition and weight loss. -Management of chronic medical conditions per hospitalist.   LOS: 3 days   Mikey Bussing, DNP, AGPCNP-BC, AOCNP 09/26/19  Addendum  I have seen the patient, examined him. I agree with the assessment and and plan and have edited the notes.   Patient underwent ERCP and stent exchange by Dr. Fuller Plan over the weekend, his liver function and hyperbilirubinemia have much improved.  However his creatinine still around 2, despite IV fluids, and he has dyspnea with when he talks and on [facility] oxygen when I saw him in late afternoon.  I will inform Dr. Dwyane Dee to see if  he needs a repeat chest x-ray. I will also add uric acid to morning lab tomorrow. Continue holding chemo for now until he recovers well. I will f/u on Wednesday if he is still in hospital.   Truitt Merle  09/26/2019

## 2019-09-26 NOTE — Progress Notes (Addendum)
[facility] Progress Note  Chief Complaint: Cholangitis with sepsis  History:  Mr. Owens Shark denies abdominal pain, nausea vomiting or chest pain today.  He still feels weakened by this illness, occasional dry cough. He has tolerated a full liquid diet since his recent procedure.  Surgical consultant note reviewed-no intervention felt to be necessary on gallbladder at this point.  Signout received and case discussed with Dr. Hilarie Fredrickson last evening. ROS:  Urinary: No dysuria with catheter in place.  Objective:   Current Facility-Administered Medications:  .  0.9 %  sodium chloride infusion (Manually program via Guardrails IV Fluids), , Intravenous, Once, Shawna Clamp, MD .  acetaminophen (TYLENOL) suppository 650 mg, 650 mg, Rectal, Q6H PRN, Cyndia Skeeters, Taye T, MD .  acetaminophen (TYLENOL) tablet 325-650 mg, 325-650 mg, Oral, Q6H PRN, Gonfa, Taye T, MD .  alum & mag hydroxide-simeth (MAALOX/MYLANTA) 200-200-20 MG/5ML suspension 30 mL, 30 mL, Oral, Q6H PRN, Cyndia Skeeters, Taye T, MD .  bisacodyl (DULCOLAX) suppository 10 mg, 10 mg, Rectal, Q12H PRN, Cyndia Skeeters, Taye T, MD .  Chlorhexidine Gluconate Cloth 2 % PADS 6 each, 6 each, Topical, Daily, Shawna Clamp, MD, 6 each at 09/25/19 1411 .  guaiFENesin-dextromethorphan (ROBITUSSIN DM) 100-10 MG/5ML syrup 10 mL, 10 mL, Oral, Q4H PRN, Gonfa, Taye T, MD .  hydrocortisone (ANUSOL-HC) 2.5 % rectal cream 1 application, 1 application, Topical, QID PRN, Gonfa, Taye T, MD .  hydrocortisone cream 1 % 1 application, 1 application, Topical, TID PRN, Gonfa, Taye T, MD .  HYDROmorphone (DILAUDID) injection 0.25-0.5 mg, 0.25-0.5 mg, Intravenous, Q5 min PRN, Lynda Rainwater, MD .  HYDROmorphone (DILAUDID) injection 0.5-2 mg, 0.5-2 mg, Intravenous, Q2H PRN, Wendee Beavers T, MD, 0.5 mg at 09/25/19 2229 .  lip balm (CARMEX) ointment 1 application, 1 application, Topical, BID, Mercy Riding, MD, 1 application at 85/63/14 2230 .  magic mouthwash, 15 mL, Oral, QID PRN, Gonfa,  Taye T, MD .  menthol-cetylpyridinium (CEPACOL) lozenge 3 mg, 1 lozenge, Oral, PRN, Gonfa, Taye T, MD .  methocarbamol (ROBAXIN) 1,000 mg in dextrose 5 % 100 mL IVPB, 1,000 mg, Intravenous, Q6H PRN, Gonfa, Taye T, MD .  ondansetron (ZOFRAN) injection 4 mg, 4 mg, Intravenous, Q6H PRN **OR** ondansetron (ZOFRAN) 8 mg in sodium chloride 0.9 % 50 mL IVPB, 8 mg, Intravenous, Q6H PRN, Gonfa, Taye T, MD .  phenol (CHLORASEPTIC) mouth spray 1-2 spray, 1-2 spray, Mouth/Throat, PRN, Gonfa, Taye T, MD .  piperacillin-tazobactam (ZOSYN) IVPB 3.375 g, 3.375 g, Intravenous, Q8H, Gonfa, Taye T, MD, Last Rate: 12.5 mL/hr at 09/26/19 0458, 3.375 g at 09/26/19 0458 .  prochlorperazine (COMPAZINE) injection 5-10 mg, 5-10 mg, Intravenous, Q4H PRN, Gonfa, Taye T, MD .  sodium chloride flush (NS) 0.9 % injection 10-40 mL, 10-40 mL, Intracatheter, Q12H, Shawna Clamp, MD, 10 mL at 09/25/19 1000 .  sodium chloride flush (NS) 0.9 % injection 10-40 mL, 10-40 mL, Intracatheter, PRN, Shawna Clamp, MD  . methocarbamol (ROBAXIN) IV    . ondansetron (ZOFRAN) IV    . piperacillin-tazobactam (ZOSYN)  IV 3.375 g (09/26/19 0458)     Vital signs in last 24 hrs: Vitals:   09/25/19 2213 09/26/19 0512  BP: 111/74 110/71  Pulse: 74 70  Resp: 20 19  Temp: 97.7 F (36.5 C) (!) 97.5 F (36.4 C)  SpO2: 100% 100%    Intake/Output Summary (Last 24 hours) at 09/26/2019 0850 Last data filed at 09/25/2019 2145 Gross per 24 hour  Intake 1152.83 ml  Output 400 ml  Net 752.83 ml  There only appears to be 400 cc of urine output noted in the last 24 hours.  His urine appears concentrated in the catheter bag  On 3 L supplemental nasal cannula oxygen  Physical Exam Not acutely ill-appearing.  He is alert and conversational but fatigued.  Chronically ill with poor muscle mass.  HEENT: sclera mildly   to an RN icteric, oral mucosa without lesions  Neck: supple, no thyromegaly, JVD or lymphadenopathy  Cardiac: RRR without  murmurs, S1S2 heard, no peripheral edema  Pulm: Breath sounds decreased throughout the left compared to right normal RR and effort noted  Abdomen: soft, no tenderness, with active bowel sounds. No guarding or palpable hepatosplenomegaly  Skin; warm and dry, no jaundice  Recent Labs:  CBC Latest Ref Rng & Units 09/26/2019 09/25/2019 09/24/2019  WBC 4.0 - 10.5 K/uL 18.2(H) 18.1(H) 26.0(H)  Hemoglobin 13.0 - 17.0 g/dL 8.2(L) 7.4(L) 8.8(L)  Hematocrit 39 - 52 % 25.3(L) 22.4(L) 26.9(L)  Platelets 150 - 400 K/uL 108(L) 108(L) 129(L)    Recent Labs  Lab 09/24/19 0418  INR 1.7*   CMP Latest Ref Rng & Units 09/25/2019 09/24/2019 09/23/2019  Glucose 70 - 99 mg/dL 201(H) 105(H) 145(H)  BUN 8 - 23 mg/dL 38(H) 25(H) 17  Creatinine 0.61 - 1.24 mg/dL 2.10(H) 1.87(H) 2.02(H)  Sodium 135 - 145 mmol/L 136 136 133(L)  Potassium 3.5 - 5.1 mmol/L 4.3 4.2 3.5  Chloride 98 - 111 mmol/L 108 108 100  CO2 22 - 32 mmol/L 17(L) 16(L) 19(L)  Calcium 8.9 - 10.3 mg/dL 7.3(L) 7.2(L) 8.6(L)  Total Protein 6.5 - 8.1 g/dL 5.4(L) 5.6(L) 6.6  Total Bilirubin 0.3 - 1.2 mg/dL 4.2(H) 5.8(H) 5.8(H)  Alkaline Phos 38 - 126 U/L 112 180(H) 292(H)  AST 15 - 41 U/L 23 25 35  ALT 0 - 44 U/L 20 19 24    Anaerobic blood culture bottle positive for gram-positive cocci.  Radiologic studies: Chest x-ray clear on 8/20  Assessment & Plan  Assessment: Acute cholangitis with possible superimposed cholecystitis and sepsis upon admission.  Elevated LFTs from underlying pancreatic cancer with biliary obstruction and then superimposed biliary stone/sludge debris and cholangitis.  Pancreatic cancer  Acute kidney injury, most likely result of sepsis.  I am concerned that his urine output has been poor, and I suspect he is still volume depleted based on that and the rising BUN.  Plan: His biliary infection is considerably improved with his white count have been decreased from 50 8K on admission down to 18 K yesterday and stable  there today. His LFTs are improved, also indicating control the infection after clearance of the bile duct and placement of new uncovered metal biliary stent.  I would continue him on IV Zosyn until tomorrow, at which point he can be converted to oral antibiotics.  If his blood culture does not grow at a specific species allowing tailoring of the antibiotics based on that, then I recommend Augmentin for an additional 10 days.  I have advanced him to a regular diet  I will also message the hospitalist about the patient's renal function, since I think that is currently what we will keep him in the hospital until it is improved.  We will follow peripherally, please call if urgent issues arise, and also when he is ready for discharge so we can arrange outpatient follow-up.  Total time 35 minutes. Nelida Meuse III Office: [phone]

## 2019-09-26 NOTE — Evaluation (Signed)
Physical Therapy Evaluation Patient Details Name: Isaac Doyle MRN: [id number] DOB: 01-10-1956 Today's Date: 09/26/2019   History of Present Illness  64 yo male admitted with sepsis 2* cholangitis. S/P ERCP 09/24/19. Hx of pancreatic ca-on chemo, anemia  Clinical Impression  On eval, pt required Min assist for mobility. He walked ~30 feet with use of a RW. Ambulation distance was limited by coughing spell, dyspnea, and general weakness. At this time, recommendation is for ST SNF rehab if pt is agreeable. Will plan to follow and progress activity as tolerated.     Follow Up Recommendations SNF (if pt is agreeable. If pt decides to return home, then HHPT and 24 hour supervision/assist)    Equipment Recommendations  Rolling walker with 5" wheels    Recommendations for Other Services       Precautions / Restrictions Precautions Precautions: Fall Restrictions Weight Bearing Restrictions: No      Mobility  Bed Mobility Overal bed mobility: Needs Assistance Bed Mobility: Supine to Sit     Supine to sit: Supervision;HOB elevated        Transfers Overall transfer level: Needs assistance Equipment used: Rolling walker (2 wheeled) Transfers: Sit to/from Stand Sit to Stand: Min assist;From elevated surface         General transfer comment: Assist to rise, stabilize, control desent. VCs safety, hand placement.  Ambulation/Gait Ambulation/Gait assistance: Min assist Gait Distance (Feet): 40 Feet Assistive device: Rolling walker (2 wheeled) Gait Pattern/deviations: Step-through pattern;Decreased step length - right;Decreased step length - left;Decreased stride length;Trunk flexed     General Gait Details: Pt walked ~15 feet then had a coughing spell +dyspnea 3/4. He was able to ambulate back to the room but it was effortful. Assist to stabilize throughout distance. Cues for safety, posture, RW proximity.  Stairs            Wheelchair Mobility    Modified Rankin  (Stroke Patients Only)       Balance Overall balance assessment: Needs assistance         Standing balance support: Bilateral upper extremity supported Standing balance-Leahy Scale: Poor                               Pertinent Vitals/Pain Pain Assessment: Faces Faces Pain Scale: Hurts little more Pain Location: abdomen Pain Descriptors / Indicators: Discomfort;Sore Pain Intervention(s): Monitored during session;Repositioned    Home Living Family/patient expects to be discharged to:: Private residence Living Arrangements: Alone Available Help at Discharge: Family;Available PRN/intermittently;Neighbor Type of Home: Apartment Home Access: Level entry     Home Layout: One level Home Equipment: Cane - single point      Prior Function Level of Independence: Independent with assistive device(s)         Comments: neighbors/family assist with meals and other household tasks as needed     Hand Dominance        Extremity/Trunk Assessment   Upper Extremity Assessment Upper Extremity Assessment: Generalized weakness    Lower Extremity Assessment Lower Extremity Assessment: Generalized weakness    Cervical / Trunk Assessment Cervical / Trunk Assessment: Kyphotic  Communication   Communication: No difficulties  Cognition Arousal/Alertness: Awake/alert Behavior During Therapy: WFL for tasks assessed/performed Overall Cognitive Status: Within Functional Limits for tasks assessed  General Comments      Exercises     Assessment/Plan    PT Assessment Patient needs continued PT services  PT Problem List Decreased strength;Decreased mobility;Decreased activity tolerance;Decreased balance;Decreased knowledge of use of DME;Pain       PT Treatment Interventions DME instruction;Gait training;Therapeutic activities;Therapeutic exercise;Patient/family education;Balance training;Functional mobility  training    PT Goals (Current goals can be found in the Care Plan section)  Acute Rehab PT Goals Patient Stated Goal: none stated PT Goal Formulation: With patient Time For Goal Achievement: 10/10/19 Potential to Achieve Goals: Good    Frequency Min 3X/week   Barriers to discharge Decreased caregiver support      Co-evaluation               AM-PAC PT "6 Clicks" Mobility  Outcome Measure Help needed turning from your back to your side while in a flat bed without using bedrails?: A Little Help needed moving from lying on your back to sitting on the side of a flat bed without using bedrails?: A Little Help needed moving to and from a bed to a chair (including a wheelchair)?: A Little Help needed standing up from a chair using your arms (e.g., wheelchair or bedside chair)?: A Little Help needed to walk in hospital room?: A Little Help needed climbing 3-5 steps with a railing? : A Lot 6 Click Score: 17    End of Session Equipment Utilized During Treatment: Gait belt Activity Tolerance: Patient limited by fatigue (limited by coughing, dyspnea) Patient left: in chair;with call bell/phone within reach;with chair alarm set   PT Visit Diagnosis: Muscle weakness (generalized) (M62.81);Difficulty in walking, not elsewhere classified (R26.2);Unsteadiness on feet (R26.81)    Time: 3845-3646 PT Time Calculation (min) (ACUTE ONLY): 24 min   Charges:   PT Evaluation $PT Eval Low Complexity: 1 Low PT Treatments $Gait Training: 8-22 mins          Doreatha Massed, PT Acute Rehabilitation  Office: [phone] Pager: [phone]

## 2019-09-26 NOTE — Progress Notes (Addendum)
PROGRESS NOTE    Isaac Doyle  DZH:[id number] DOB: 01/12/1956 DOA: 09/23/2019 PCP: System, Pcp Not In   Brief Narrative:  Isaac Doyle is a 64 y.o. male with history of pancreatic cancer on chemotherapy, biliary obstruction s/p stent in 05/2019, anemia on intermittent transfusion and recent diagnosis with possible cholangitis presenting with hot flash, cold sweats, nausea, vomiting and diarrhea last night.  Patient reports sudden onset of hot flashes, cold sweat, vomiting and diarrhea about midnight. He had 2 episodes of emesis and one episode of diarrhea. Emesis was nonbloody but greenish. Diarrhea was watery but denies blood. Last emesis and diarrhea was about midnight. He called EMS this morning because he had severe right upper quadrant pain. His pain was about 9/10 but improved to 6/10 after pain medication in ED. He describes his pain as cramping. Denies radiation to his shoulder or his back. He also reports shortness of breath since last night. He denies cough, fever, chest pain, runny nose, sore throat or UTI symptoms.Of note, patient was started on Cipro out of concern for cholangitis when he was noted to have hyperbilirubinemia about 3 weeks ago. He also received G-CSF.    In ED, BP 81/51>> 180/68 after 2.5 L IV fluid. WBC 58 with left shift. Hgb 8.6 (about baseline). LA 2.1>> 2.0. Na 133. Bicarb 19. AG 15. Cr 2.0 (baseline ~1.0). BUN 17. ALP 292. AST and ALT within normal. Total bili 5.8 (3.9 direct). CT abdomen and pelvis  and RUQ [place] concerning for acute cholecystitis. Resuscitated with IV fluid. Cultures obtained. Started on IV Zosyn. GI and general surgery consulted, and hospitalist service called for admission. Patient admitted for sepsis secondary to acute cholangitis, he underwent ERCP with stone removal,stent placement,  tolerated well.  He reports feeling better, GI recommended continue current management, LFTS improving.  General surgery signed off,  patient does not require  cholecystectomy.  GI recommended IV Zosyn until tomorrow,  start regular diet,  monitor for improvement in renal functions.  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 Also with evidence of endorgan damage including hypotension and lactic acidosis. Patient is immunocompromised. CT abdomen and pelvis and RUQ [place] concerning for acute cholecystitis with patent biliary stent. -Admit to stepdown unit -Continue IV fluids -Continue IV Zosyn -Follow GI and general surgery recommendations -GI recommended ERCP, underwent ERCP with stone removal,  stent removal..  Tolerated well. -We will continue to trend LFTs and other labs. -LFTs trending down, patient reports feeling better, denies nausea, vomiting, abdominal pain. -Patient might not need cholecystectomy, started clear liquid diet.  Will advance to full liquids.  History of pancreatic cancer on chemotherapy- Patient was to complete chemotherapy which was held last week due to anemia -Oncology involved. He is followed by Dr. Burr Medico  History of biliary obstruction s/p biliary stent in 05/2019:  Per CT and ultrasound report, biliary stent seems to be patent. However, total bili and direct bilirubin elevated. -GI consulted by EDP.  Hyperbilirubinemia: Obstructive? -Defer to GI. -Trending Down.  Acute kidney injury- Cr 2.0 (baseline~1.1). BUN is within normal. Could be a combination of dehydration and NSAID use. - continue IV  fluids -Avoid/minimize nephrotoxic meds. -Recheck in the morning.  Metabolic acidosis:  Improving.  Likely from emesis and renal failure. -Continue monitoring  Dyspnea/shortness of breath: Suspect this to be from cholecystitis. Lung exam reassuring. No acute finding on CXR. -Try as needed DuoNeb  Essential hypertension: Hypotensive on admission. Now normotensive. -Hold home antihypertensive medications  Anemia of chronic disease: H&H stable. -Hemoglobin 7.4,  will transfuse 1 PRBC. -posttransfusion CBC 8.4, check H and H   Leukocytosis/bandemia: Likely due to infection and G-CSF. -Manage infection as above -Continue monitoring  DVT prophylaxis: SCD          Code Status: DNR/DNI Family Communication: Updated patient's daughter at bedside.           Disposition Plan: Admit to stepdown unit Consults called: General surgery, GI and oncology Patient is not medically clear ongoing work-up for cholangitis cholecystitis. Anticipated discharge skilled nursing facility or home with home services in 1 to 2 days.   Consultants:   Gastroenterology, General surgery.  Procedures: ERCP, stent removal, biliary stent placement, removal of stones.  Antimicrobials:  Anti-infectives (From admission, onward)   Start     Dose/Rate Route Frequency Ordered Stop   09/23/19 1900  piperacillin-tazobactam (ZOSYN) IVPB 3.375 g        3.375 g 12.5 mL/hr over 240 Minutes Intravenous Every 8 hours 09/23/19 1246     09/23/19 1030  piperacillin-tazobactam (ZOSYN) IVPB 3.375 g        3.375 g 100 mL/hr over 30 Minutes Intravenous  Once 09/23/19 1030 09/23/19 1144      Subjective: Patient was seen and examined at bedside.  No overnight events.  He is a s/p ERCP with stone removal.  He reports feeling better, he denies nausea, vomiting abdominal pain.  Has tolerated clear liquid diet.  Objective: Vitals:   09/25/19 1919 09/25/19 2013 09/25/19 2213 09/26/19 0512  BP: 100/74 102/71 111/74 110/71   Pulse: 76 72 74 70  Resp: 19 20 20 19   Temp: (!) 96.6 F (35.9 C) 98 F (36.7 C) 97.7 F (36.5 C) (!) 97.5 F (36.4 C)  TempSrc: Oral Oral Oral Oral  SpO2: 100% 100% 100% 100%  Weight:      Height:        Intake/Output Summary (Last 24 hours) at 09/26/2019 1316 Last data filed at 09/25/2019 2145 Gross per 24 hour  Intake 662.83 ml  Output 400 ml  Net 262.83 ml   Filed Weights   09/23/19 0849  Weight: 69.4 kg    Examination:  General exam: Appears calm and comfortable  Respiratory system: Clear to auscultation. Respiratory effort normal. Cardiovascular system: S1 & S2 heard, RRR. No JVD, murmurs, rubs, gallops or clicks. No pedal edema. Gastrointestinal system: Abdomen is nondistended, soft and nontender. No organomegaly or masses felt. Normal bowel sounds heard. Central nervous system: Alert and oriented. No focal neurological deficits. Extremities: No leg edema, no cyanosis no swelling. Skin: No rashes, lesions or ulcers Psychiatry: Judgement and insight appear normal. Mood & affect appropriate.     Data Reviewed: I have personally reviewed following labs and imaging studies  CBC: Recent Labs  Lab 09/23/19 0930 09/24/19 0418 09/25/19 0443 09/26/19 0455 09/26/19 1201  WBC 58.2* 26.0* 18.1* 18.2*  --   NEUTROABS 52.2*  --   --   --   --  HGB 8.6* 8.8* 7.4* 8.2* 8.4*  HCT 26.3* 26.9* 22.4* 25.3* 26.0*  MCV 82.4 82.8 81.8 82.4  --   PLT 172 129* 108* 108*  --    Basic Metabolic Panel: Recent Labs  Lab 09/23/19 0930 09/24/19 0418 09/25/19 0443 09/26/19 0455  NA 133* 136 136 137  K 3.5 4.2 4.3 3.9  CL 100 108 108 108  CO2 19* 16* 17* 18*  GLUCOSE 145* 105* 201* 134*  BUN 17 25* 38* 47*  CREATININE 2.02* 1.87* 2.10* 2.16*  CALCIUM 8.6* 7.2* 7.3* 7.6*  MG  --  1.0* 1.9 2.1  PHOS  --  3.4 3.8 3.6   GFR: Estimated Creatinine Clearance: 33.5 mL/min (A) (by C-G formula based on SCr of 2.16 mg/dL (H)). Liver Function Tests: Recent Labs  Lab  09/23/19 0930 09/24/19 0418 09/25/19 0443 09/26/19 0455  AST 35 25 23 22   ALT 24 19 20 20   ALKPHOS 292* 180* 112 101  BILITOT 5.8* 5.8* 4.2* 2.2*  PROT 6.6 5.6* 5.4* 5.4*  ALBUMIN 2.6* 2.1* 2.4* 2.1*   Recent Labs  Lab 09/23/19 0930  LIPASE 20   No results for input(s): AMMONIA in the last 168 hours. Coagulation Profile: Recent Labs  Lab 09/23/19 0930 09/24/19 0418  INR 1.4* 1.7*   Cardiac Enzymes: No results for input(s): CKTOTAL, CKMB, CKMBINDEX, TROPONINI in the last 168 hours. BNP (last 3 results) No results for input(s): PROBNP in the last 8760 hours. HbA1C: No results for input(s): HGBA1C in the last 72 hours. CBG: No results for input(s): GLUCAP in the last 168 hours. Lipid Profile: No results for input(s): CHOL, HDL, LDLCALC, TRIG, CHOLHDL, LDLDIRECT in the last 72 hours. Thyroid Function Tests: No results for input(s): TSH, T4TOTAL, FREET4, T3FREE, THYROIDAB in the last 72 hours. Anemia Panel: No results for input(s): VITAMINB12, FOLATE, FERRITIN, TIBC, IRON, RETICCTPCT in the last 72 hours. Sepsis Labs: Recent Labs  Lab 09/23/19 0936 09/23/19 1315  LATICACIDVEN 2.1* 2.0*    Recent Results (from the past 240 hour(s))  Culture, Blood     Status: None   Collection Time: 09/19/19 10:15 AM   Specimen: BLOOD RIGHT HAND  Result Value Ref Range Status   Specimen Description   Final    BLOOD RIGHT HAND Performed at [facility], [address], [place], [place] [address]    Special Requests   Final    NONE Performed at [facility], [place] [address] [place], [place] [address]    Culture   Final    NO GROWTH 5 DAYS Performed at [facility], [place] [address], [place], [place] [address]    Report Status 09/24/2019 FINAL  Final  Culture, Blood     Status: None   Collection Time: 09/19/19 10:45 AM   Specimen: Porta Cath; Blood  Result Value Ref Range Status   Specimen Description   Final    PORTA  CATH Performed at [facility], [place] [address], [place], [place] [address]    Special Requests   Final    BOTTLES DRAWN AEROBIC AND ANAEROBIC Blood Culture adequate volume   Culture   Final    NO GROWTH 5 DAYS Performed at [facility], [place] [address] [place], [place] [address]    Report Status 09/24/2019 FINAL  Final  Urine culture     Status: None   Collection Time: 09/23/19  8:49 AM   Specimen: Urine, Random  Result Value  Ref Range Status   Specimen Description   Final    URINE, RANDOM Performed at [place] [address], [place], [place] [address]    Special Requests   Final    NONE Performed at [facility], [place] [address] [place], [place] [address]    Culture   Final    NO GROWTH Performed at [facility], [place] [address] [place], [place] [address]    Report Status 09/25/2019 FINAL  Final  SARS Coronavirus 2 by RT PCR (hospital order, performed in [facility] hospital lab) Nasopharyngeal Nasopharyngeal Swab     Status: None   Collection Time: 09/23/19  8:50 AM   Specimen: Nasopharyngeal Swab  Result Value Ref Range Status   SARS Coronavirus 2 NEGATIVE NEGATIVE Final    Comment: (NOTE) SARS-CoV-2 target nucleic acids are NOT DETECTED.  The SARS-CoV-2 RNA is generally detectable in upper and lower respiratory specimens during the acute phase of infection. The lowest concentration of SARS-CoV-2 viral copies this assay can detect is 250 copies / mL. A negative result does not preclude SARS-CoV-2 infection and should not be used as the sole basis for treatment or other patient management decisions.  A negative result may occur with improper specimen collection / handling, submission of specimen other than nasopharyngeal swab, presence of viral mutation(s) within the areas targeted by this assay, and inadequate number of viral copies (<250 copies / mL). A negative result must be combined  with clinical observations, patient history, and epidemiological information.  Fact Sheet for Patients:   StrictlyIdeas.no  Fact Sheet for Healthcare Providers: BankingDealers.co.za  This test is not yet approved or  cleared by the [place] FDA and has been authorized for detection and/or diagnosis of SARS-CoV-2 by FDA under an Emergency Use Authorization (EUA).  This EUA will remain in effect (meaning this test can be used) for the duration of the COVID-19 declaration under Section 564(b)(1) of the Act, 21 U.S.C. section 360bbb-3(b)(1), unless the authorization is terminated or revoked sooner.  Performed at [facility], [place] [address] [place], [place] [address]   Blood culture (routine x 2)     Status: None (Preliminary result)   Collection Time: 09/23/19  9:30 AM   Specimen: BLOOD  Result Value Ref Range Status   Specimen Description   Final    BLOOD SITE NOT SPECIFIED Performed at [place] [address] [place], [place] [address]    Special Requests   Final    BOTTLES DRAWN AEROBIC AND ANAEROBIC Blood Culture results may not be optimal due to an inadequate volume of blood received in culture bottles Performed at [place] [address] [place], [place] [address]    Culture  Setup Time   Final    ANAEROBIC BOTTLE ONLY GRAM POSITIVE COCCI CRITICAL RESULT CALLED TO, READ BACK BY AND VERIFIED WITH: Concepcion Elk [phone] FCP Performed at [facility], [place] [address], [place], [place] [address]    Culture GRAM POSITIVE COCCI  Final   Report Status PENDING  Incomplete  Blood Culture ID Panel (Reflexed)     Status: None   Collection Time: 09/23/19  9:30 AM  Result Value Ref Range Status   Enterococcus faecalis NOT DETECTED NOT DETECTED Final   Enterococcus Faecium NOT DETECTED NOT DETECTED Final   Listeria monocytogenes NOT DETECTED NOT DETECTED  Final   Staphylococcus species NOT DETECTED NOT DETECTED Final  Staphylococcus aureus (BCID) NOT DETECTED NOT DETECTED Final   Staphylococcus epidermidis NOT DETECTED NOT DETECTED Final   Staphylococcus lugdunensis NOT DETECTED NOT DETECTED Final   Streptococcus species NOT DETECTED NOT DETECTED Final   Streptococcus agalactiae NOT DETECTED NOT DETECTED Final   Streptococcus pneumoniae NOT DETECTED NOT DETECTED Final   Streptococcus pyogenes NOT DETECTED NOT DETECTED Final   A.calcoaceticus-baumannii NOT DETECTED NOT DETECTED Final   Bacteroides fragilis NOT DETECTED NOT DETECTED Final   Enterobacterales NOT DETECTED NOT DETECTED Final   Enterobacter cloacae complex NOT DETECTED NOT DETECTED Final   Escherichia coli NOT DETECTED NOT DETECTED Final   Klebsiella aerogenes NOT DETECTED NOT DETECTED Final   Klebsiella oxytoca NOT DETECTED NOT DETECTED Final   Klebsiella pneumoniae NOT DETECTED NOT DETECTED Final   Proteus species NOT DETECTED NOT DETECTED Final   Salmonella species NOT DETECTED NOT DETECTED Final   Serratia marcescens NOT DETECTED NOT DETECTED Final   Haemophilus influenzae NOT DETECTED NOT DETECTED Final   Neisseria meningitidis NOT DETECTED NOT DETECTED Final   Pseudomonas aeruginosa NOT DETECTED NOT DETECTED Final   Stenotrophomonas maltophilia NOT DETECTED NOT DETECTED Final   Candida albicans NOT DETECTED NOT DETECTED Final   Candida auris NOT DETECTED NOT DETECTED Final   Candida glabrata NOT DETECTED NOT DETECTED Final   Candida krusei NOT DETECTED NOT DETECTED Final   Candida parapsilosis NOT DETECTED NOT DETECTED Final   Candida tropicalis NOT DETECTED NOT DETECTED Final   Cryptococcus neoformans/gattii NOT DETECTED NOT DETECTED Final    Comment: Performed at [facility], [address], [place], [place] [address]     Radiology Studies: No results found.  Scheduled Meds: . sodium chloride   Intravenous Once  . Chlorhexidine Gluconate Cloth  6 each  Topical Daily  . lip balm  1 application Topical BID  . sodium chloride flush  10-40 mL Intracatheter Q12H   Continuous Infusions: . sodium chloride 75 mL/hr at 09/26/19 1124  . methocarbamol (ROBAXIN) IV    . ondansetron (ZOFRAN) IV    . piperacillin-tazobactam (ZOSYN)  IV 3.375 g (09/26/19 1123)     LOS: 3 days    Time spent: 25 mins.    Shawna Clamp, MD [facility]   If 7PM-7AM, please contact night-coverage

## 2019-09-27 DIAGNOSIS — D638 Anemia in other chronic diseases classified elsewhere: Secondary | ICD-10-CM

## 2019-09-27 LAB — COMPREHENSIVE METABOLIC PANEL
ALT: 19 U/L (ref 0–44)
AST: 23 U/L (ref 15–41)
Albumin: 2 g/dL — ABNORMAL LOW (ref 3.5–5.0)
Alkaline Phosphatase: 104 U/L (ref 38–126)
Anion gap: 11 (ref 5–15)
BUN: 42 mg/dL — ABNORMAL HIGH (ref 8–23)
CO2: 18 mmol/L — ABNORMAL LOW (ref 22–32)
Calcium: 7.7 mg/dL — ABNORMAL LOW (ref 8.9–10.3)
Chloride: 105 mmol/L (ref 98–111)
Creatinine, Ser: 1.73 mg/dL — ABNORMAL HIGH (ref 0.61–1.24)
GFR calc Af Amer: 47 mL/min — ABNORMAL LOW (ref >60)
GFR calc non Af Amer: 41 mL/min — ABNORMAL LOW (ref >60)
Glucose, Bld: 109 mg/dL — ABNORMAL HIGH (ref 70–99)
Potassium: 3.7 mmol/L (ref 3.5–5.1)
Sodium: 134 mmol/L — ABNORMAL LOW (ref 135–145)
Total Bilirubin: 2.5 mg/dL — ABNORMAL HIGH (ref 0.3–1.2)
Total Protein: 5.5 g/dL — ABNORMAL LOW (ref 6.5–8.1)

## 2019-09-27 LAB — CBC
HCT: 25.7 % — ABNORMAL LOW (ref 39.0–52.0)
Hemoglobin: 8.3 g/dL — ABNORMAL LOW (ref 13.0–17.0)
MCH: 26.5 pg (ref 26.0–34.0)
MCHC: 32.3 g/dL (ref 30.0–36.0)
MCV: 82.1 fL (ref 80.0–100.0)
Platelets: 87 10*3/uL — ABNORMAL LOW (ref 150–400)
RBC: 3.13 MIL/uL — ABNORMAL LOW (ref 4.22–5.81)
RDW: 17.3 % — ABNORMAL HIGH (ref 11.5–15.5)
WBC: 13.3 10*3/uL — ABNORMAL HIGH (ref 4.0–10.5)
nRBC: 0 % (ref 0.0–0.2)

## 2019-09-27 LAB — MAGNESIUM: Magnesium: 1.9 mg/dL (ref 1.7–2.4)

## 2019-09-27 LAB — PHOSPHORUS: Phosphorus: 3.9 mg/dL (ref 2.5–4.6)

## 2019-09-27 LAB — URIC ACID: Uric Acid, Serum: 6.9 mg/dL (ref 3.7–8.6)

## 2019-09-27 NOTE — Progress Notes (Signed)
PROGRESS NOTE    Isaac Doyle  LZJ:[id number] DOB: 05/30/1955 DOA: 09/23/2019 PCP: System, Pcp Not In   Brief Narrative:  Isaac Doyle is a 64 y.o. male with history of pancreatic cancer on chemotherapy, biliary obstruction s/p stent in 05/2019, anemia on intermittent transfusion and recent diagnosis with possible cholangitis presenting with hot flash, cold sweats, nausea, vomiting and diarrhea last night.  In ED, BP 81/51>> 180/68 after 2.5 L IV fluid. WBC 58 with left shift. Hgb 8.6 (about baseline). LA 2.1>> 2.0. Na 133. Bicarb 19. AG 15. Cr 2.0 (baseline ~1.0). BUN 17. ALP 292. AST and ALT within normal. Total bili 5.8 (3.9 direct). CT abdomen and pelvis  and RUQ [place] concerning for acute cholecystitis. Resuscitated with IV fluid. Cultures obtained. Started on IV Zosyn. GI and general surgery consulted, and hospitalist service called for admission.  Patient admitted for sepsis secondary to acute cholangitis, He underwent ERCP with stone removal, stent placement,  tolerated well.  He reports feeling better, GI recommended continue current management, LFTS improving.  General surgery signed off,  patient does not require cholecystectomy.  GI recommended Augmentin for 7-10 days on discharge, GI signed off, renal functions are improving.  PT recommended home with home PT.  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 Also with evidence of endorgan damage including hypotension and  lactic acidosis.   Patient is immunocompromised.   CT abdomen and pelvis and RUQ [place] concerning for acute cholecystitis with patent biliary stent. -Admitted to stepdown unit -Continue IV fluids -Continue IV Zosyn -Follow GI and general surgery recommendations -GI recommended ERCP, He underwent ERCP with stone removal,  stent removal..  Tolerated well. -LFTs trending down, patient reports feeling better, denies nausea, vomiting, abdominal pain. -Patient might not need cholecystectomy, started clear liquid diet.  Will advance to full liquids. -GI signed off recommended Augmentin for 7 to 10 days at discharge.  History of pancreatic cancer on chemotherapy- Patient was to complete chemotherapy which was held last week due to anemia -Oncology involved. He is followed by Dr. Burr Medico, recommended to hold on chemotherapy for now.  History of biliary obstruction s/p biliary stent in 05/2019:  Per CT and ultrasound report, biliary stent seems to be patent. However, total bili and direct bilirubin elevated.  Hyperbilirubinemia: -Trending Down.  Acute kidney injury- Cr 2.0 (baseline~1.1). BUN is within normal.  Could be a combination of dehydration and NSAID use. - continue IV fluids -Avoid/minimize nephrotoxic meds. -Renal functions improving trending down.  Metabolic acidosis:  Improving.  Likely from emesis and renal failure. -Continue monitoring  Dyspnea/shortness of breath: Suspect this to be from cholecystitis. Lung exam reassuring. No acute finding on CXR. -Try as needed DuoNeb  Essential hypertension: Hypotensive on admission. Now normotensive. -Hold home antihypertensive medications  Anemia of chronic disease: H&H stable. -Hemoglobin 7.4, s/p1 PRBC. -posttransfusion CBC 8.4,  Leukocytosis/bandemia: Likely due to infection and G-CSF. -Manage infection as above -Continue monitoring  DVT prophylaxis:  SCD          Code Status: DNR/DNI Family Communication: Updated  patient's daughter at bedside.           Disposition Plan:  Consults called: General surgery, GI and oncology Patient is not medically clear due to acute kidney injury. Anticipated discharge home with Home PT 8/25.  GI signed off. GI recommended Augmentin for 7 to 10 days on discharge.   Consultants:   Gastroenterology, General surgery.  Procedures: ERCP, stent removal, biliary stent placement, removal of stones.  Antimicrobials:  Anti-infectives (From admission, onward)   Start     Dose/Rate Route Frequency Ordered Stop   09/23/19 1900  piperacillin-tazobactam (ZOSYN) IVPB 3.375 g        3.375 g 12.5 mL/hr over 240 Minutes Intravenous Every 8 hours 09/23/19 1246     09/23/19 1030  piperacillin-tazobactam (ZOSYN) IVPB 3.375 g        3.375 g 100 mL/hr over 30 Minutes Intravenous  Once 09/23/19 1030 09/23/19 1144      Subjective: Patient was seen and examined at bedside.  No overnight events.   He reports feeling better, He denies nausea, vomiting abdominal pain.   Has tolerated full liquid diet.  Objective: Vitals:   09/26/19 1342 09/26/19 2045 09/27/19 0336 09/27/19 1330  BP: 122/87 121/74 102/64 128/79  Pulse: 72 72 72 86  Resp: 18 18 18 17   Temp: 98 F (36.7 C) 98.1 F (36.7 C) 98.1 F (36.7 C) 98.1 F (36.7 C)  TempSrc: Oral Oral Oral Oral  SpO2: 100% 100% 100% 100%  Weight:      Height:        Intake/Output Summary (Last 24 hours) at 09/27/2019 1531 Last data filed at 09/27/2019 0630 Gross per 24 hour  Intake --  Output 800 ml  Net -800 ml   Filed Weights   09/23/19 0849  Weight: 69.4 kg    Examination:  General exam: Appears calm and comfortable  Respiratory system: Clear to auscultation. Respiratory effort normal. Cardiovascular system: S1 & S2 heard, RRR. No JVD, murmurs, rubs, gallops or clicks. No pedal edema. Gastrointestinal system: Abdomen is nondistended, soft and nontender. No organomegaly or masses felt. Normal bowel sounds heard. Central  nervous system: Alert and oriented. No focal neurological deficits. Extremities: No leg edema, no cyanosis no swelling. Skin: No rashes, lesions or ulcers Psychiatry: Judgement and insight appear normal. Mood & affect appropriate.     Data Reviewed: I have personally reviewed following labs and imaging studies  CBC: Recent Labs  Lab 09/23/19 0930 09/23/19 0930 09/24/19 0418 09/25/19 0443 09/26/19 0455 09/26/19 1201 09/27/19 0442  WBC 58.2*  --  26.0* 18.1* 18.2*  --  13.3*  NEUTROABS 52.2*  --   --   --   --   --   --   HGB 8.6*   < > 8.8* 7.4* 8.2* 8.4* 8.3*  HCT 26.3*   < > 26.9* 22.4* 25.3* 26.0* 25.7*  MCV 82.4  --  82.8 81.8 82.4  --  82.1  PLT 172  --  129* 108* 108*  --  87*   < > = values in this interval not displayed.   Basic Metabolic Panel: Recent Labs  Lab 09/23/19 0930 09/24/19 0418 09/25/19 0443 09/26/19 0455 09/27/19 0442  NA 133* 136 136 137 134*  K 3.5 4.2 4.3 3.9 3.7  CL 100 108 108 108 105  CO2 19* 16* 17* 18* 18*  GLUCOSE 145* 105* 201* 134* 109*  BUN 17 25* 38* 47* 42*  CREATININE 2.02* 1.87* 2.10* 2.16* 1.73*  CALCIUM 8.6* 7.2* 7.3* 7.6* 7.7*  MG  --  1.0* 1.9 2.1 1.9  PHOS  --  3.4 3.8 3.6 3.9   GFR: Estimated Creatinine Clearance: 41.8 mL/min (A) (by C-G formula based on SCr of 1.73 mg/dL (H)). Liver Function Tests: Recent Labs  Lab 09/23/19 0930 09/24/19 0418 09/25/19 0443 09/26/19 0455 09/27/19 0442  AST 35 25 23 22 23   ALT 24 19 20 20 19   ALKPHOS 292* 180* 112 101 104  BILITOT 5.8* 5.8* 4.2* 2.2* 2.5*  PROT 6.6 5.6* 5.4* 5.4* 5.5*  ALBUMIN 2.6* 2.1* 2.4* 2.1* 2.0*   Recent Labs  Lab 09/23/19 0930  LIPASE 20   No results for input(s): AMMONIA in the last 168 hours. Coagulation Profile: Recent Labs  Lab 09/23/19 0930 09/24/19 0418  INR 1.4* 1.7*   Cardiac Enzymes: No results for input(s): CKTOTAL, CKMB, CKMBINDEX, TROPONINI in the last 168 hours. BNP (last 3 results) No results for input(s): PROBNP in the last  8760 hours. HbA1C: No results for input(s): HGBA1C in the last 72 hours. CBG: No results for input(s): GLUCAP in the last 168 hours. Lipid Profile: No results for input(s): CHOL, HDL, LDLCALC, TRIG, CHOLHDL, LDLDIRECT in the last 72 hours. Thyroid Function Tests: No results for input(s): TSH, T4TOTAL, FREET4, T3FREE, THYROIDAB in the last 72 hours. Anemia Panel: No results for input(s): VITAMINB12, FOLATE, FERRITIN, TIBC, IRON, RETICCTPCT in the last 72 hours. Sepsis Labs: Recent Labs  Lab 09/23/19 0936 09/23/19 1315  LATICACIDVEN 2.1* 2.0*    Recent Results (from the past 240 hour(s))  Culture, Blood     Status: None   Collection Time: 09/19/19 10:15 AM   Specimen: BLOOD RIGHT HAND  Result Value Ref Range Status   Specimen Description   Final    BLOOD RIGHT HAND Performed at [facility], [address], [place], [place] [address]    Special Requests   Final    NONE Performed at [facility], [place] [address], [place], [place] [address]    Culture   Final    NO GROWTH 5 DAYS Performed at [facility], [place] [address], [place], [place] [address]    Report Status 09/24/2019 FINAL  Final  Culture, Blood     Status: None   Collection Time: 09/19/19 10:45 AM   Specimen: Porta Cath; Blood  Result Value Ref Range Status   Specimen Description   Final    PORTA CATH Performed at [facility], [place] [address] [place], [place] [address]    Special Requests   Final    BOTTLES DRAWN AEROBIC AND ANAEROBIC Blood Culture adequate volume   Culture   Final    NO GROWTH 5 DAYS Performed at [facility], [place] [address] [place], [place] [address]    Report Status 09/24/2019 FINAL  Final  Urine culture     Status: None   Collection Time: 09/23/19  8:49 AM   Specimen: Urine, Random  Result Value Ref Range Status   Specimen Description   Final    URINE, RANDOM Performed at [place] [address], [place], [place] [address]    Special Requests   Final    NONE Performed at [facility], [place] [address] [place], [place] [address]    Culture   Final    NO GROWTH Performed  at [facility], [place] [address] [place], [place] [address]    Report Status 09/25/2019 FINAL  Final  SARS Coronavirus 2 by RT PCR (hospital order, performed in [facility] hospital lab) Nasopharyngeal Nasopharyngeal Swab     Status: None   Collection Time: 09/23/19  8:50 AM   Specimen: Nasopharyngeal Swab  Result Value Ref Range Status   SARS Coronavirus 2 NEGATIVE NEGATIVE Final    Comment: (NOTE) SARS-CoV-2 target nucleic acids are NOT DETECTED.  The SARS-CoV-2 RNA is generally detectable in upper and lower respiratory specimens during the acute phase of infection. The lowest concentration of SARS-CoV-2 viral copies this assay can detect is 250 copies / mL. A negative result does not preclude SARS-CoV-2 infection and should not be used as the sole basis for treatment or other patient management decisions.  A negative result may occur with improper specimen collection / handling, submission of specimen other than nasopharyngeal swab, presence of viral mutation(s) within the areas targeted by this assay, and inadequate number of viral copies (<250 copies / mL). A negative result must be combined with clinical observations, patient history, and epidemiological information.  Fact Sheet for Patients:   StrictlyIdeas.no  Fact Sheet for Healthcare Providers: BankingDealers.co.za  This test is not yet approved or  cleared by the [place] FDA and has been authorized for detection and/or diagnosis of SARS-CoV-2 by FDA under an Emergency Use Authorization (EUA).  This EUA will remain in effect (meaning this test can be used) for the duration of the COVID-19 declaration under Section 564(b)(1) of the Act, 21  U.S.C. section 360bbb-3(b)(1), unless the authorization is terminated or revoked sooner.  Performed at [facility], [place] [address] [place], [place] [address]   Blood culture (routine x 2)     Status: None (Preliminary result)   Collection Time: 09/23/19  9:30 AM   Specimen: BLOOD  Result Value Ref Range Status   Specimen Description   Final    BLOOD SITE NOT SPECIFIED Performed at [place] [address], [place], [place] [address]    Special Requests   Final    BOTTLES DRAWN AEROBIC AND ANAEROBIC Blood Culture results may not be optimal due to an inadequate volume of blood received in culture bottles Performed at [place] [address] [place], [place] [address]    Culture  Setup Time   Final    ANAEROBIC BOTTLE ONLY GRAM POSITIVE COCCI CRITICAL RESULT CALLED TO, READ BACK BY AND VERIFIED WITH: Concepcion Elk [phone] FCP Performed at [facility], [place] [address], [place], [place] [address]    Culture GRAM POSITIVE COCCI  Final   Report Status PENDING  Incomplete  Blood Culture ID Panel (Reflexed)     Status: None   Collection Time: 09/23/19  9:30 AM  Result Value Ref Range Status   Enterococcus faecalis NOT DETECTED NOT DETECTED Final   Enterococcus Faecium NOT DETECTED NOT DETECTED Final   Listeria monocytogenes NOT DETECTED NOT DETECTED Final   Staphylococcus species NOT DETECTED NOT DETECTED Final   Staphylococcus aureus (BCID) NOT DETECTED NOT DETECTED Final   Staphylococcus epidermidis NOT DETECTED NOT DETECTED Final   Staphylococcus lugdunensis NOT DETECTED NOT DETECTED Final   Streptococcus species NOT DETECTED NOT DETECTED Final   Streptococcus agalactiae NOT DETECTED NOT DETECTED Final   Streptococcus pneumoniae NOT DETECTED NOT DETECTED Final   Streptococcus pyogenes NOT DETECTED NOT DETECTED Final   A.calcoaceticus-baumannii NOT DETECTED  NOT DETECTED Final   Bacteroides fragilis NOT  DETECTED NOT DETECTED Final   Enterobacterales NOT DETECTED NOT DETECTED Final   Enterobacter cloacae complex NOT DETECTED NOT DETECTED Final   Escherichia coli NOT DETECTED NOT DETECTED Final   Klebsiella aerogenes NOT DETECTED NOT DETECTED Final   Klebsiella oxytoca NOT DETECTED NOT DETECTED Final   Klebsiella pneumoniae NOT DETECTED NOT DETECTED Final   Proteus species NOT DETECTED NOT DETECTED Final   Salmonella species NOT DETECTED NOT DETECTED Final   Serratia marcescens NOT DETECTED NOT DETECTED Final   Haemophilus influenzae NOT DETECTED NOT DETECTED Final   Neisseria meningitidis NOT DETECTED NOT DETECTED Final   Pseudomonas aeruginosa NOT DETECTED NOT DETECTED Final   Stenotrophomonas maltophilia NOT DETECTED NOT DETECTED Final   Candida albicans NOT DETECTED NOT DETECTED Final   Candida auris NOT DETECTED NOT DETECTED Final   Candida glabrata NOT DETECTED NOT DETECTED Final   Candida krusei NOT DETECTED NOT DETECTED Final   Candida parapsilosis NOT DETECTED NOT DETECTED Final   Candida tropicalis NOT DETECTED NOT DETECTED Final   Cryptococcus neoformans/gattii NOT DETECTED NOT DETECTED Final    Comment: Performed at [facility], [place] [address] [place], [place] [address]     Radiology Studies: DG CHEST PORT 1 VIEW  Result Date: 09/26/2019 CLINICAL DATA:  Shortness of breath EXAM: PORTABLE CHEST 1 VIEW COMPARISON:  09/23/2019 FINDINGS: Right Port-A-Cath remains in place, unchanged. Bibasilar airspace opacities, favor atelectasis. Small bilateral effusions suspected. Heart is normal size. No edema. No acute bony abnormality. IMPRESSION: Small bilateral pleural effusions with bibasilar opacities, likely atelectasis. Electronically Signed   By: Rolm Baptise M.D.   On: 09/26/2019 18:28    Scheduled Meds: . sodium chloride   Intravenous Once  . Chlorhexidine Gluconate Cloth  6 each Topical Daily  . lip balm  1 application Topical BID  . sodium bicarbonate  650 mg Oral  TID  . sodium chloride flush  10-40 mL Intracatheter Q12H   Continuous Infusions: . methocarbamol (ROBAXIN) IV    . ondansetron (ZOFRAN) IV    . piperacillin-tazobactam (ZOSYN)  IV 3.375 g (09/27/19 1048)     LOS: 4 days    Time spent: 25 mins.    Shawna Clamp, MD [facility]   If 7PM-7AM, please contact night-coverage

## 2019-09-27 NOTE — Evaluation (Signed)
Occupational Therapy Evaluation Patient Details Name: Isaac Doyle MRN: [id number] DOB: 03-12-1955 Today's Date: 09/27/2019    History of Present Illness 64 yo male admitted with sepsis 2* cholangitis. S/P ERCP 09/24/19. Hx of pancreatic ca-on chemo, anemia   Clinical Impression   Pt admitted with the above. Pt currently with functional limitations due to the deficits listed below (see OT Problem List).  Pt will benefit from skilled OT to increase their safety and independence with ADL and functional mobility for ADL to facilitate discharge to venue listed below.      Follow Up Recommendations  Home health OT;SNF;Supervision/Assistance - 24 hour    Equipment Recommendations  3 in 1 bedside commode    Recommendations for Other Services       Precautions / Restrictions Precautions Precautions: Fall      Mobility Bed Mobility Overal bed mobility: Modified Independent                Transfers Overall transfer level: Needs assistance Equipment used: Rolling walker (2 wheeled) Transfers: Sit to/from [employer] Transfers Sit to Stand: From elevated surface;Min assist Stand pivot transfers: Min assist       General transfer comment: Min guard for safety. Cues for safety, hand placement.    Balance Overall balance assessment: Needs assistance         Standing balance support: Bilateral upper extremity supported Standing balance-Leahy Scale: Poor                             ADL either performed or assessed with clinical judgement   ADL Overall ADL's : Needs assistance/impaired Eating/Feeding: Set up;Sitting   Grooming: Set up;Sitting   Upper Body Bathing: Set up;Sitting   Lower Body Bathing: Moderate assistance;Sit to/from stand;Cueing for safety;Cueing for sequencing   Upper Body Dressing : Set up;Sitting   Lower Body Dressing: Moderate assistance;Sit to/from stand;Cueing for safety;Cueing for sequencing   Toilet Transfer:  Moderate assistance;BSC;Cueing for sequencing;Cueing for safety   Toileting- Clothing Manipulation and Hygiene: Moderate assistance;Sit to/from stand;Cueing for safety;Cueing for sequencing       Functional mobility during ADLs: Minimal assistance General ADL Comments: pt very willing to participate.     Vision Patient Visual Report: No change from baseline              Pertinent Vitals/Pain Pain Assessment: No/denies pain     Hand Dominance     Extremity/Trunk Assessment         Cervical / Trunk Assessment Cervical / Trunk Assessment: Kyphotic   Communication Communication Communication: No difficulties   Cognition Arousal/Alertness: Awake/alert Behavior During Therapy: WFL for tasks assessed/performed Overall Cognitive Status: Within Functional Limits for tasks assessed                                                Home Living Family/patient expects to be discharged to:: Private residence Living Arrangements: Alone Available Help at Discharge: Family;Available PRN/intermittently;Neighbor Type of Home: Apartment Home Access: Level entry     Home Layout: One level     Bathroom Shower/Tub: Tub/shower unit         Home Equipment: Cane - single point          Prior Functioning/Environment Level of Independence: Independent with assistive device(s)        Comments: neighbors/family  assist with meals and other household tasks as needed        OT Problem List: Impaired balance (sitting and/or standing);Decreased activity tolerance;Decreased knowledge of use of DME or AE;Decreased strength      OT Treatment/Interventions: Self-care/ADL training;Patient/family education;DME and/or AE instruction    OT Goals(Current goals can be found in the care plan section) Acute Rehab OT Goals Patient Stated Goal: none stated OT Goal Formulation: With patient Time For Goal Achievement: 10/04/19 Potential to Achieve Goals: Good  OT  Frequency: Min 2X/week   Barriers to D/C:               AM-PAC OT "6 Clicks" Daily Activity     Outcome Measure Help from another person eating meals?: None Help from another person taking care of personal grooming?: A Little Help from another person toileting, which includes using toliet, bedpan, or urinal?: A Little Help from another person bathing (including washing, rinsing, drying)?: A Little Help from another person to put on and taking off regular upper body clothing?: A Little Help from another person to put on and taking off regular lower body clothing?: A Little 6 Click Score: 19   End of Session Equipment Utilized During Treatment: Gait belt;Rolling walker Nurse Communication: Mobility status  Activity Tolerance: Patient tolerated treatment well Patient left: in chair;with call bell/phone within reach;with chair alarm set  OT Visit Diagnosis: Unsteadiness on feet (R26.81);Muscle weakness (generalized) (M62.81);Repeated falls (R29.6)                Time: 7741-2878 OT Time Calculation (min): 29 min Charges:  OT General Charges $OT Visit: 1 Visit OT Evaluation $OT Eval Moderate Complexity: 1 Mod OT Treatments $Self Care/Home Management : 8-22 mins  Kari Baars, OT Acute Rehabilitation Services [phone] Office- [phone], Edwena Felty D 09/27/2019, 6:38 PM

## 2019-09-27 NOTE — Progress Notes (Signed)
Physical Therapy Treatment Patient Details Name: Isaac Doyle MRN: [id number] DOB: [birth year] Today's Date: 09/27/2019    History of Present Illness 64 yo male admitted with sepsis 2* cholangitis. S/P ERCP 09/24/19. Hx of pancreatic ca-on chemo, anemia    PT Comments    Progressing with mobility. Pt appears short of breath at times but O2 sats >90% on RA throughout session. Discussed d/c plan-pt politely declines placement. Hopefully we can maximize home health services for him. Will continue to follow and progress activity as tolerated.     Follow Up Recommendations  Home health PT (pt politely declines placement. If we could maximize home health services, that would be great)     Equipment Recommendations  Rolling walker with 5" wheels    Recommendations for Other Services       Precautions / Restrictions Precautions Precautions: Fall Restrictions Weight Bearing Restrictions: No    Mobility  Bed Mobility Overal bed mobility: Modified Independent                Transfers Overall transfer level: Needs assistance Equipment used: Rolling walker (2 wheeled) Transfers: Sit to/from Stand Sit to Stand: Min guard;From elevated surface         General transfer comment: Min guard for safety. Cues for safety, hand placement.  Ambulation/Gait Ambulation/Gait assistance: Min guard Gait Distance (Feet): 75 Feet Assistive device: Rolling walker (2 wheeled) Gait Pattern/deviations: Decreased step length - right;Decreased step length - left;Step-through pattern;Decreased stride length;Trunk flexed     General Gait Details: Min guard for safety. Slow gait speed. Cues for safety, posture, RW proximity. Dyspnea 2/4   Stairs             Wheelchair Mobility    Modified Rankin (Stroke Patients Only)       Balance Overall balance assessment: Needs assistance         Standing balance support: Bilateral upper extremity supported Standing balance-Leahy  Scale: Poor                              Cognition Arousal/Alertness: Awake/alert Behavior During Therapy: WFL for tasks assessed/performed Overall Cognitive Status: Within Functional Limits for tasks assessed                                        Exercises      General Comments        Pertinent Vitals/Pain Pain Assessment: Faces Faces Pain Scale: Hurts little more Pain Location: abdomen Pain Descriptors / Indicators: Discomfort;Sore Pain Intervention(s): Limited activity within patient's tolerance;Monitored during session;Repositioned    Home Living                      Prior Function            PT Goals (current goals can now be found in the care plan section) Progress towards PT goals: Progressing toward goals    Frequency    Min 3X/week      PT Plan Current plan remains appropriate    Co-evaluation              AM-PAC PT "6 Clicks" Mobility   Outcome Measure  Help needed turning from your back to your side while in a flat bed without using bedrails?: None Help needed moving from lying on your back to sitting on the side  of a flat bed without using bedrails?: A Little Help needed moving to and from a bed to a chair (including a wheelchair)?: A Little Help needed standing up from a chair using your arms (e.g., wheelchair or bedside chair)?: A Little Help needed to walk in hospital room?: A Little Help needed climbing 3-5 steps with a railing? : A Little 6 Click Score: 19    End of Session Equipment Utilized During Treatment: Gait belt Activity Tolerance: Patient tolerated treatment well Patient left: in bed;with call bell/phone within reach;with bed alarm set   PT Visit Diagnosis: Muscle weakness (generalized) (M62.81);Difficulty in walking, not elsewhere classified (R26.2);Unsteadiness on feet (R26.81)     Time: 3903-0092 PT Time Calculation (min) (ACUTE ONLY): 23 min  Charges:  $Gait Training: 8-22  mins              Doreatha Massed, PT Acute Rehabilitation  Office: [phone] Pager: [phone]

## 2019-09-27 NOTE — TOC Initial Note (Addendum)
Transition of Care [facility]) - Initial/Assessment Note    Patient Details  Name: Isaac Doyle MRN: [id number] Date of Birth: November 14, 1955  [facility]) CM/SW Contact:    Trish Mage, LCSW Phone Number: 09/27/2019, 1:56 PM  Clinical Narrative:    Patient seen in follow up to PT recommendation of SNF/24 hour supervision.  [name] is clear that he does not want to go to short term rehab, wants me to set up [facility] PT in the home.  When asked about supports, states he lives alone, but has neighbors on all sides in his retirement community [facility]] who look out for him.  He does need a walker, but states he does not need BSC and has grab handles in shower and by commode. We called daughter together to explain his options, she wondered if he can get an aide through the [place].  I called and left a message for [employer].  She expressed concerns about his ability to care for self, and pushed him a bit about rehab, but he was adamant about returning home.  After I left the room, daughter called me back, expressing concern for the plan, and wondered if she could make decisions for him as she is health care POA.  I explained that he needs to agree to this plan, and she plans on visiting this afternoon to see if she can convince him to go. TOC will continue to follow during the course of hospitalization.  Addendum:  Daughter was unable to convince patient to go to rehab.  Denton Ar, CSW at [place], called back and let me know he could qualify for up to 16 hours a week of in home aide help, and is approved for [facility] PT/OT through [facility]. This can start fairly immediately upon d/c.   He will stay with daughter until he is doing well enough to return home.                  Expected Discharge Plan: [place] Barriers to Discharge: No Barriers Identified   Patient Goals and CMS Choice Patient states their goals for this hospitalization and ongoing recovery are:: "I don't  want to go to a home." CMS Medicare.gov Compare Post Acute Care list provided to:: Patient Choice offered to / list presented to : Patient, Adult Children  Expected Discharge Plan and Services Expected Discharge Plan: [place]   Discharge Planning Services: CM Consult Post Acute Care Choice: [place] arrangements for the past 2 months: Apartment                                      Prior Living Arrangements/Services Living arrangements for the past 2 months: Apartment Lives with:: Self Patient language and need for interpreter reviewed:: Yes Do you feel safe going back to the place where you live?: Yes      Need for Family Participation in Patient Care: Yes (Comment) Care giver support system in place?: Yes (comment) Current home services: DME Criminal Activity/Legal Involvement Pertinent to Current Situation/Hospitalization: No - Comment as needed  Activities of Daily Living Home Assistive Devices/Equipment: Eyeglasses, Cane (specify quad or straight) ADL Screening (condition at time of admission) Patient's cognitive ability adequate to safely complete daily activities?: Yes Is the patient deaf or have difficulty hearing?: No Does the patient have difficulty seeing, even when wearing glasses/contacts?:  No Does the patient have difficulty concentrating, remembering, or making decisions?: No Patient able to express need for assistance with ADLs?: Yes Does the patient have difficulty dressing or bathing?: No Independently performs ADLs?: Yes (appropriate for developmental age) Does the patient have difficulty walking or climbing stairs?: Yes (secondary to weakness) Weakness of Legs: Both Weakness of Arms/Hands: None  Permission Sought/Granted Permission sought to share information with : Family Supports Permission granted to share information with : Yes, Verbal Permission Granted  Share Information with NAME: daughter            Emotional Assessment Appearance:: Appears stated age Attitude/Demeanor/Rapport: Engaged Affect (typically observed): Appropriate Orientation: : Oriented to Self, Oriented to Place, Oriented to Situation Alcohol / Substance Use: Not Applicable Psych Involvement: No (comment)  Admission diagnosis:  Acute cholecystitis [K81.0] Hyperbilirubinemia [E80.6] Severe sepsis with acute organ dysfunction (HCC) [A41.9, R65.20] Sepsis, due to unspecified organism, unspecified whether acute organ dysfunction present [facility]) [A41.9] Patient Active Problem List   Diagnosis Date Noted  . Elevated LFTs   . RUQ abdominal pain   . Severe sepsis with acute organ dysfunction ([place]) 09/23/2019  . 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 Anemia of chronic disease 09/23/2019  . Cholangitis 09/23/2019  . Acute cholecystitis   . Port-A-Cath in place 08/17/2019  . Symptomatic anemia 08/08/2019  . Malignant obstructive jaundice ([place]) 06/03/2019  . Malignant neoplasm of pancreas ([place])   . ARF (acute renal failure) ([place]) 05/10/2019  . Essential hypertension 05/10/2019  . Hypokalemia 05/10/2019  . Normochromic normocytic anemia 05/10/2019  . Malignant biliary obstruction ([place])   . Jaundice 05/09/2019   PCP:  System, Pcp Not In Pharmacy:   [facility] [place], [address] [place] - [address] [place] [address] Phone: [phone] Fax: [phone]     Social Determinants of Health (SDOH) Interventions    Readmission Risk Interventions No flowsheet data found.

## 2019-09-27 NOTE — Progress Notes (Addendum)
Progress Note       Chief Complaint: pancreatic cancer / cholangitis with sepsis  ASSESSMENT AND PLAN:   # Pancreatic cancer / biliary obstruction with acute cholangitis / sepsis /  possible cholecystitis  --Still not clear whether he had superimposed acute cholecystitis, maybe not given the improvement after biliary sludge removal and biliary stent exchange.   --WBC and LFTs continues to improve. Alk phos normal, Tbili remaining around 2.5, down from 5.8 --Anaerobic blood culture >>  Gram positive cocci.  --Has been on IV Zosyn. Recommended transition to PO Augmentin x 10 days.  --Hopefully home soon  # AKI, improving --Cr improved from 2.16 to 1.73.  --Urine output better. He had 400 ml out yesterday. This am he already has ~ 500 ml urine in foley bag.     SUBJECTIVE   Minimal RUQ discomfort but only when coughing. Tolerating PO. Seems a little shob when talking but 02 sat 98% on room air.   OBJECTIVE:     Vital signs in last 24 hours: Temp:  [98 F (36.7 C)-98.1 F (36.7 C)] 98.1 F (36.7 C) (08/24 0336) Pulse Rate:  [72] 72 (08/24 0336) Resp:  [18] 18 (08/24 0336) BP: (102-122)/(64-87) 102/64 (08/24 0336) SpO2:  [100 %] 100 % (08/24 0336) Last BM Date: 09/23/19 General:   Alert, in NAD Heart:  Regular rate and rhythm.  No lower extremity edema   Pulm: Normal respiratory effort   Abdomen:  Soft,  nontender, nondistended.  Normal bowel sounds.          Neurologic:  Alert and  oriented,  grossly normal neurologically. Psych:  Pleasant, cooperative.  Normal mood and affect.   Intake/Output from previous day: 08/23 0701 - 08/24 0700 In: -  Out: 800 [Urine:800] Intake/Output this shift: No intake/output data recorded.  Lab Results: Recent Labs    09/25/19 0443 09/25/19 0443 09/26/19 0455 09/26/19 1201 09/27/19 0442  WBC 18.1*  --  18.2*  --  13.3*  HGB 7.4*   < > 8.2* 8.4* 8.3*  HCT 22.4*   < > 25.3* 26.0* 25.7*  PLT 108*  --  108*  --  87*   < > =  values in this interval not displayed.   BMET Recent Labs    09/25/19 0443 09/26/19 0455 09/27/19 0442  NA 136 137 134*  K 4.3 3.9 3.7  CL 108 108 105  CO2 17* 18* 18*  GLUCOSE 201* 134* 109*  BUN 38* 47* 42*  CREATININE 2.10* 2.16* 1.73*  CALCIUM 7.3* 7.6* 7.7*   LFT Recent Labs    09/27/19 0442  PROT 5.5*  ALBUMIN 2.0*  AST 23  ALT 19  ALKPHOS 104  BILITOT 2.5*   PT/INR No results for input(s): LABPROT, INR in the last 72 hours. Hepatitis Panel No results for input(s): HEPBSAG, HCVAB, HEPAIGM, HEPBIGM in the last 72 hours.  DG CHEST PORT 1 VIEW  Result Date: 09/26/2019 CLINICAL DATA:  Shortness of breath EXAM: PORTABLE CHEST 1 VIEW COMPARISON:  09/23/2019 FINDINGS: Right Port-A-Cath remains in place, unchanged. Bibasilar airspace opacities, favor atelectasis. Small bilateral effusions suspected. Heart is normal size. No edema. No acute bony abnormality. IMPRESSION: Small bilateral pleural effusions with bibasilar opacities, likely atelectasis. Electronically Signed   By: Rolm Baptise M.D.   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 I personally interviewed and examined the patient.  Steady improvement in patient's infectious illness.  Afebrile, WBC continues to decrease to 13,000 today.  Multifactorial anemia and thrombocytopenia from recent sepsis and underlying malignancy.  LFTs have steadily improved back  to what is probably his new baseline with a mildly increased bilirubin in the setting of pancreatic cancer with metal biliary stent.  His cholangitis +/-cholecystitis is improved enough that he can be transitioned to Augmentin for another 7 to 10 days and continue regular diet. Internal medicine to decide timing of discharge based on renal function and respiratory status. He does not need regular follow-up with our clinic, but we are certainly available as the need arises.  Signing off, call as needed.   25 minutes were spent on this encounter (including chart review, history/exam, counseling/coordination of care, and documentation)   Nelida Meuse III Office: [phone]

## 2019-09-28 ENCOUNTER — Inpatient Hospital Stay: Payer: No Typology Code available for payment source | Admitting: Nurse Practitioner

## 2019-09-28 ENCOUNTER — Inpatient Hospital Stay: Payer: No Typology Code available for payment source

## 2019-09-28 DIAGNOSIS — N179 Acute kidney failure, unspecified: Secondary | ICD-10-CM

## 2019-09-28 LAB — CBC
HCT: 25.3 % — ABNORMAL LOW (ref 39.0–52.0)
Hemoglobin: 8 g/dL — ABNORMAL LOW (ref 13.0–17.0)
MCH: 26.2 pg (ref 26.0–34.0)
MCHC: 31.6 g/dL (ref 30.0–36.0)
MCV: 83 fL (ref 80.0–100.0)
Platelets: 86 10*3/uL — ABNORMAL LOW (ref 150–400)
RBC: 3.05 MIL/uL — ABNORMAL LOW (ref 4.22–5.81)
RDW: 17.6 % — ABNORMAL HIGH (ref 11.5–15.5)
WBC: 11.2 10*3/uL — ABNORMAL HIGH (ref 4.0–10.5)
nRBC: 0 % (ref 0.0–0.2)

## 2019-09-28 LAB — BASIC METABOLIC PANEL WITH GFR
Anion gap: 12 (ref 5–15)
BUN: 35 mg/dL — ABNORMAL HIGH (ref 8–23)
CO2: 20 mmol/L — ABNORMAL LOW (ref 22–32)
Calcium: 8.1 mg/dL — ABNORMAL LOW (ref 8.9–10.3)
Chloride: 107 mmol/L (ref 98–111)
Creatinine, Ser: 1.44 mg/dL — ABNORMAL HIGH (ref 0.61–1.24)
GFR calc Af Amer: 59 mL/min — ABNORMAL LOW
GFR calc non Af Amer: 51 mL/min — ABNORMAL LOW
Glucose, Bld: 117 mg/dL — ABNORMAL HIGH (ref 70–99)
Potassium: 3.4 mmol/L — ABNORMAL LOW (ref 3.5–5.1)
Sodium: 139 mmol/L (ref 135–145)

## 2019-09-28 MED ORDER — METHOCARBAMOL 500 MG PO TABS: 500.0000 mg | ORAL_TABLET | Freq: Four times a day (QID) | ORAL | 0 refills | Status: DC | PRN

## 2019-09-28 MED ORDER — AMOXICILLIN-POT CLAVULANATE 875-125 MG PO TABS: 1.0000 | ORAL_TABLET | Freq: Two times a day (BID) | ORAL | 0 refills | Status: DC

## 2019-09-28 MED ORDER — ATENOLOL 100 MG PO TABS: 100.0000 mg | ORAL_TABLET | Freq: Every day | ORAL | 0 refills | Status: AC

## 2019-09-28 MED ORDER — PANTOPRAZOLE SODIUM 40 MG PO TBEC: 40.0000 mg | DELAYED_RELEASE_TABLET | Freq: Every day | ORAL | 3 refills | Status: DC

## 2019-09-28 MED ORDER — HYDROCORTISONE (PERIANAL) 2.5 % EX CREA: 1.0000 "application " | TOPICAL_CREAM | Freq: Four times a day (QID) | CUTANEOUS | 2 refills | Status: DC | PRN

## 2019-09-28 MED ORDER — HEPARIN SOD (PORK) LOCK FLUSH 100 UNIT/ML IV SOLN
500.0000 [IU] | INTRAVENOUS | Status: AC | PRN
  Administered 2019-09-28: 500 [IU]

## 2019-09-28 MED ORDER — ONDANSETRON 4 MG PO TBDP: 4.0000 mg | ORAL_TABLET | Freq: Three times a day (TID) | ORAL | 0 refills | Status: AC | PRN

## 2019-09-28 MED ORDER — TRAMADOL HCL 50 MG PO TABS: 50.0000 mg | ORAL_TABLET | Freq: Three times a day (TID) | ORAL | 0 refills | Status: DC | PRN

## 2019-09-28 NOTE — Discharge Summary (Signed)
Physician Discharge Summary   Patient ID: Isaac Doyle MRN: [id number] DOB/AGE: 1955-11-05 64 y.o.  Admit date: 09/23/2019 Discharge date: 09/28/2019  Primary Care Physician:  System, Pcp Not In   Recommendations for Outpatient Follow-up:  1. Follow up with PCP in 1-2 weeks 2. Continue Augmentin 1 tab p.o. twice daily for 10 days 3. Follow CMET at the time of discharge.  Home Health: Home health PT, OT Equipment/Devices: DME 3 n 1, rolling walker  Discharge Condition: stable  CODE STATUS: DNR Diet recommendation: Heart healthy diet   Discharge Diagnoses:    . Severe sepsis with acute organ dysfunction ([place]) Acute cholangitis with possible cholecystitis status post ERCP, biliary sludge removal and biliary stent exchange . Malignant neoplasm of pancreas ([place]) on chemotherapy . Malignant biliary obstruction ([facility]) status post biliary stent in 05/2019 . Malignant obstructive jaundice ([place]) . Symptomatic anemia . Acute kidney injury ([place]) . Hypokalemia with metabolic acidosis Generalized deconditioning  Consults: Gastroenterology    Allergies:  No Known Allergies   DISCHARGE MEDICATIONS: Allergies as of 09/28/2019   No Known Allergies     Medication List    STOP taking these medications   Advil 200 MG tablet Generic drug: ibuprofen   ciprofloxacin 500 MG tablet Commonly known as: Cipro   sulindac 200 MG tablet Commonly known as: CLINORIL     TAKE these medications   amoxicillin-clavulanate 875-125 MG tablet Commonly known as: Augmentin Take 1 tablet by mouth 2 (two) times daily for 10 days.   atenolol 100 MG tablet Commonly known as: TENORMIN Take 1 tablet (100 mg total) by mouth daily. Hold until follow-up with your PCP What changed: additional instructions   cetirizine 10 MG tablet Commonly known as: ZYRTEC Take 10 mg by mouth daily.   diphenoxylate-atropine 2.5-0.025 MG tablet Commonly known as: LOMOTIL Take 1-2 tablets by mouth 4 (four)  times daily as needed for diarrhea or loose stools. What changed: how much to take   guaifenesin 400 MG Tabs tablet Commonly known as: HUMIBID E Take 1 tablet (400 mg total) by mouth every 6 (six) hours as needed. What changed: when to take this   hydrocortisone 2.5 % rectal cream Commonly known as: ANUSOL-HC Apply 1 application topically 4 (four) times daily as needed for hemorrhoids.   lidocaine-prilocaine cream Commonly known as: EMLA Apply 1 application topically as needed. What changed: reasons to take this   loperamide 2 MG capsule Commonly known as: IMODIUM Take 1-2 capsules (2-4 mg total) by mouth every 6 (six) hours as needed for diarrhea or loose stools.   magnesium oxide 400 (241.3 Mg) MG tablet Commonly known as: MAG-OX Take 1 tablet (400 mg total) by mouth daily. What changed: when to take this   methocarbamol 500 MG tablet Commonly known as: Robaxin Take 1 tablet (500 mg total) by mouth every 6 (six) hours as needed for muscle spasms.   ondansetron 4 MG disintegrating tablet Commonly known as: Zofran ODT Take 1 tablet (4 mg total) by mouth every 8 (eight) hours as needed for nausea or vomiting.   pantoprazole 40 MG tablet Commonly known as: PROTONIX Take 1 tablet (40 mg total) by mouth daily.   potassium chloride SA 20 MEQ tablet Commonly known as: KLOR-CON Take 2 tablets (40 mEq total) by mouth daily.   traMADol 50 MG tablet Commonly known as: Ultram Take 1 tablet (50 mg total) by mouth every 8 (eight) hours as needed for moderate pain or severe pain.  Durable Medical Equipment  (From admission, onward)         Start     Ordered   09/28/19 1157  For home use only DME Walker rolling  Once       Question Answer Comment  Walker: With [place] Wheels   Patient needs a walker to treat with the following condition Gait instability      09/28/19 1157   09/28/19 1100  For home use only DME 3 n 1  Once        09/28/19 1059            Brief H and P: For complete details please refer to admission H and P, but in brief Isaac Doyle a 64 y.o.malewith history ofpancreatic cancer on chemotherapy, biliary obstructions/pstent in 05/2019, anemia on intermittent transfusionand recentdiagnosis with possible cholangitis presenting with hot flash, cold sweats, nausea, vomiting and diarrhea last night.  Patient reports sudden onset of hot flashes, cold sweat, vomiting and diarrhea about midnight. He had 2 episodes of emesis and one episode of diarrhea. Emesis was nonbloody but greenish. Diarrhea was watery but denies blood. Last emesis and diarrhea was about midnight. He called EMS this morning because he had severe right upper quadrant pain.His pain was about 9/10butimproved to 6/10 after pain medication in ED. He describes his pain as cramping. Denies radiation to his shoulder or his back. He also reports shortness of breath since last night. He denies cough, fever, chest pain, runny nose, sore throat or UTI symptoms.Of note, patient was started on Cipro out of concern for cholangitis when he was noted to have hyperbilirubinemia about 3 weeks ago. He also received G-CSF.   In ED, BP 81/51>>180/68 after 2.5 LIV fluid.WBC 58 with left shift. Hgb 8.6 (about baseline).LA2.1>>2.0. DU202. Bicarb 19. AG 15.Cr2.0 (baseline~1.0). BUN 17. ALP 292. AST and ALT within normal. Total bili 5.8 (3.9 direct).CT abdomen and pelvis and RUQ USconcerning for acute cholecystitis.Resuscitated with IV fluid. Cultures obtained. Started on IV Zosyn.  GI, general surgery were consulted.  Patient was admitted for sepsis secondary to acute cholangitis.   Hospital Course:    Severe sepsis due to acute cholangitis, possible acute cholecystitis,  -Patient presented with tachypnea, significant leukocytosis, acute kidney injury, hypotension and lactic acidosis. -Patient has immunocompromise status due to pancreatic CA on chemotherapy -CT  abdomen pelvis, RUQ ultrasound concerning for acute cholecystitis with patent biliary stent.  Patient was admitted to stepdown unit, placed on IV fluids and IV Zosyn.  General surgery and GI were consulted. -Patient underwent ERCP with biliary sludge removal and biliary stent exchange. -Anaerobic blood cultures showed gram-positive cocci, however BC ID negative.  GI recommended p.o. Augmentin for 10 days.  Cleared to be discharged home.  History of pancreatic cancer on chemotherapy-  - Patient was to complete chemotherapy which was held prior week due to anemia -Oncology involved. He is followed by Dr. Burr Medico  History of biliary obstructions/pbiliary stent in 05/2019, hyperbilirubinemia Per CT and ultrasound report, biliary stent seems to be patent.  GI was consulted, underwent ERCP, with biliary sludge removal and stent exchange LFTs trending down   Acute kidney injury-  - Cr2.0 (baseline~1.1).  2.16 - BUN is within normal.Could be a combination of dehydration and NSAID use. - Patient was placed on IV fluid hydration, creatinine improving to 1.4 at the time of discharge.  Metabolic acidosis:  Improving.  -Likely from AKI, vomiting, improving  Dyspnea/shortness of breath:  -Possibly due to #1, currently no wheezing,  improving  Essential hypertension -BP now stable, continue atenolol   Anemia of chronic disease:  -H&H currently stable, 8.0 at the time of discharge.     Leukocytosis/bandemia:Likely due to infection and G-CSF. -Leukocytosis improving, 11.2 at the time of discharge  Generalized debility PT OT evaluation done, recommended home health PT with rolling walker, 3n 1  Day of Discharge S: No acute complaints, no fevers or chills, hoping to go home today.  BP 116/70   Pulse 86   Temp 97.9 F (36.6 C) (Oral)   Resp (!) 21   Ht 5\' 11"  (1.803 m)   Wt 69.4 kg   SpO2 100%   BMI 21.34 kg/m   Physical Exam: General: Alert and awake oriented x3 not in any  acute distress. HEENT: anicteric sclera, pupils reactive to light and accommodation CVS: S1-S2 clear no murmur rubs or gallops Chest: clear to auscultation bilaterally, no wheezing rales or rhonchi Abdomen: soft nontender, nondistended, normal bowel sounds Extremities: no cyanosis, clubbing or edema noted bilaterally Neuro: Cranial nerves II-XII intact, no focal neurological deficits    Get Medicines reviewed and adjusted: Please take all your medications with you for your next visit with your Primary MD  Please request your Primary MD to go over all hospital tests and procedure/radiological results at the follow up. Please ask your Primary MD to get all Hospital records sent to his/her office.  If you experience worsening of your admission symptoms, develop shortness of breath, life threatening emergency, suicidal or homicidal thoughts you must seek medical attention immediately by calling 911 or calling your MD immediately  if symptoms less severe.  You must read complete instructions/literature along with all the possible adverse reactions/side effects for all the Medicines you take and that have been prescribed to you. Take any new Medicines after you have completely understood and accept all the possible adverse reactions/side effects.   Do not drive when taking pain medications.   Do not take more than prescribed Pain, Sleep and Anxiety Medications  Special Instructions: If you have smoked or chewed Tobacco  in the last 2 yrs please stop smoking, stop any regular Alcohol  and or any Recreational drug use.  Wear Seat belts while driving.  Please note  You were cared for by a hospitalist during your hospital stay. Once you are discharged, your primary care physician will handle any further medical issues. Please note that NO REFILLS for any discharge medications will be authorized once you are discharged, as it is imperative that you return to your primary care physician (or  establish a relationship with a primary care physician if you do not have one) for your aftercare needs so that they can reassess your need for medications and monitor your lab values.   The results of significant diagnostics from this hospitalization (including imaging, microbiology, ancillary and laboratory) are listed below for reference.      Procedures/Studies:  CT ABDOMEN PELVIS WO CONTRAST  Result Date: 09/23/2019 CLINICAL DATA:  Nausea and vomiting. Weakness and hypertension for 1 week. History of pancreatic cancer. EXAM: CT ABDOMEN AND PELVIS WITHOUT CONTRAST TECHNIQUE: Multidetector CT imaging of the abdomen and pelvis was performed following the standard protocol without IV contrast. COMPARISON:  08/10/2019 MRI.  CT of 05/13/2019 FINDINGS: Lower chest: Clear lung bases. Normal heart size without pericardial or pleural effusion. Hepatobiliary: Limited evaluation for focal liver lesion. Common duct stent in place, with pneumobilia suggesting stent patency. Gallstones. Mild gallbladder distension with pericholecystic edema, including on 34/3. Pancreas:  The pancreatic primary is poorly evaluated on this noncontrast exam. No gross pancreatic duct dilatation. No convincing evidence of pancreatitis. Spleen: Normal in size, without focal abnormality. Adrenals/Urinary Tract: Normal adrenal glands. No renal calculi or hydronephrosis. 2.0 cm interpolar left renal low-density lesion is likely a cyst. Mild right renal cortical thinning. No renal calculi or hydronephrosis. No bladder calculi. Stomach/Bowel: Normal stomach, without wall thickening. Scattered colonic diverticula. Normal terminal ileum and appendix. Normal small bowel. Vascular/Lymphatic: No gross arterial encasement. No abdominopelvic adenopathy. An 11 mm upper normal portal caval node is similar to 12 mm on the prior. 21/3 today. Reproductive: Normal prostate. Other: No significant free fluid. No evidence of omental or peritoneal disease.  Musculoskeletal: Prior trauma or surgery involving right iliac crest. Mild wedging of the T12 vertebral body, chronic. IMPRESSION: 1. Findings highly suspicious for acute cholecystitis. Gallbladder distension and pericholecystic edema in the setting of gallstones. Depending on clinical concern, ultrasound could confirm. 2. Suboptimal evaluation of the pancreatic primary secondary to noncontrast technique. Common duct stent in place with pneumobilia. 3.  Aortic Atherosclerosis (ICD10-I70.0). Electronically Signed   By: Abigail Miyamoto M.D.   On: 09/23/2019 11:05   DG CHEST PORT 1 VIEW  Result Date: 09/26/2019 CLINICAL DATA:  Shortness of breath EXAM: PORTABLE CHEST 1 VIEW COMPARISON:  09/23/2019 FINDINGS: Right Port-A-Cath remains in place, unchanged. Bibasilar airspace opacities, favor atelectasis. Small bilateral effusions suspected. Heart is normal size. No edema. No acute bony abnormality. IMPRESSION: Small bilateral pleural effusions with bibasilar opacities, likely atelectasis. Electronically Signed   By: Rolm Baptise M.D.   On: 09/26/2019 18:28   DG Chest Portable 1 View  Result Date: 09/23/2019 CLINICAL DATA:  Cough.  History of pancreatic cancer EXAM: PORTABLE CHEST 1 VIEW COMPARISON:  08/08/2019 FINDINGS: The heart size and mediastinal contours are within normal limits. Both lungs are clear. The visualized skeletal structures are unremarkable. Port-A-Cath tip in the lower SVC unchanged from the prior study. IMPRESSION: No active disease. Electronically Signed   By: Franchot Gallo M.D.   On: 09/23/2019 12:38   DG ERCP BILIARY & PANCREATIC DUCTS  Result Date: 09/24/2019 CLINICAL DATA:  Pancreas cancer, biliary obstruction occluded stent EXAM: ERCP balloon sweep and stent exchange TECHNIQUE: Multiple spot images obtained with the fluoroscopic device and submitted for interpretation post-procedure. FLUOROSCOPY TIME:  Fluoroscopy Time:  2 minutes 29 seconds COMPARISON:  09/23/2019 CT FINDINGS: Limited  ERCP demonstrates removal of the covered biliary stent. Balloon sweep performed. This was followed by placement of a new metallic self expanding biliary stent in a similar position. Final image demonstrates residual pneumobilia with decompression of the biliary tree. IMPRESSION: Limited imaging during ERCP with distal CBD stent exchanged These images were submitted for radiologic interpretation only. Please see the procedural report for the amount of contrast and the fluoroscopy time utilized. Electronically Signed   By: Jerilynn Mages.  Shick M.D.   On: 09/24/2019 11:57   US Abdomen Limited RUQ  Result Date: 09/23/2019 CLINICAL DATA:  Nausea and vomiting. History of pancreatic cancer and biliary stent EXAM: ULTRASOUND ABDOMEN LIMITED RIGHT UPPER QUADRANT COMPARISON:  CT 09/23/2019 FINDINGS: Gallbladder: Gallbladder lumen is completely filled with heterogeneous sludge and small shadowing stones. Irregular wall thickening with associated pericholecystic fluid. No sonographic Murphy sign noted by sonographer. Common bile duct: Diameter: 4 mm.  Pneumobilia noted. Liver: Reverberation artifact within the intrahepatic biliary ducts compatible with pneumobilia. No focal lesion identified. Within normal limits in parenchymal echogenicity. Portal vein is patent on color Doppler imaging with normal direction  of blood flow towards the liver. Other: None. IMPRESSION: 1. Abnormal appearance of the gallbladder which is distended by sludge and small stones. Irregular gallbladder wall thickening and pericholecystic edema. Findings are suspicious for acute cholecystitis. 2. Pneumobilia. Electronically Signed   By: Davina Poke D.O.   On: 09/23/2019 11:43      LAB RESULTS: Basic Metabolic Panel: Recent Labs  Lab 09/27/19 0442 09/28/19 0446  NA 134* 139  K 3.7 3.4*  CL 105 107  CO2 18* 20*  GLUCOSE 109* 117*  BUN 42* 35*  CREATININE 1.73* 1.44*  CALCIUM 7.7* 8.1*  MG 1.9  --   PHOS 3.9  --    Liver Function  Tests: Recent Labs  Lab 09/26/19 0455 09/27/19 0442  AST 22 23  ALT 20 19  ALKPHOS 101 104  BILITOT 2.2* 2.5*  PROT 5.4* 5.5*  ALBUMIN 2.1* 2.0*   Recent Labs  Lab 09/23/19 0930  LIPASE 20   No results for input(s): AMMONIA in the last 168 hours. CBC: Recent Labs  Lab 09/23/19 0930 09/24/19 0418 09/27/19 0442 09/27/19 0442 09/28/19 0446  WBC 58.2*   < > 13.3*  --  11.2*  NEUTROABS 52.2*  --   --   --   --   HGB 8.6*   < > 8.3*  --  8.0*  HCT 26.3*   < > 25.7*  --  25.3*  MCV 82.4   < > 82.1   < > 83.0  PLT 172   < > 87*  --  86*   < > = values in this interval not displayed.   Cardiac Enzymes: No results for input(s): CKTOTAL, CKMB, CKMBINDEX, TROPONINI in the last 168 hours. BNP: Invalid input(s): POCBNP CBG: No results for input(s): GLUCAP in the last 168 hours.     Disposition and Follow-up:    DISPOSITION: Home with home health, patient declined placement.   DISCHARGE FOLLOW-UP  Follow-up Information    Danis, Estill Cotta III, MD. Schedule an appointment as soon as possible for a visit in 2 week(s).   Specialty: Gastroenterology Contact information: [address] [place] [address] [phone]                Time coordinating discharge:  54mins   Signed:   Estill Cotta M.D. [facility] 09/28/2019, 12:30 PM

## 2019-09-28 NOTE — TOC Transition Note (Signed)
Transition of Care [facility]) - CM/SW Discharge Note   Patient Details  Name: Jobe Mutch MRN: [id number] Date of Birth: 1955/10/22  [facility] CM/SW Contact:  Trish Mage, LCSW Phone Number: 09/28/2019, 1:27 PM   Clinical Narrative:   Patient to d/c home today.  Contacted [facility] to let them know of [facility] referral.  They will start with patient within 48 hours.  Called [employer] for order of DME RW.  Will deliver to room.  Family to transport home. TOC sign off.  Addendum: Family is ready to take patient home and no delivery of DME yet.  Called the DME number and asked them to deliver to home. Will be there within 48 hours. Confirmed.     Final next level of care: [place] Barriers to Discharge: No Barriers Identified   Patient Goals and CMS Choice Patient states their goals for this hospitalization and ongoing recovery are:: "I don't want to go to a home." CMS Medicare.gov Compare Post Acute Care list provided to:: Patient Choice offered to / list presented to : Patient, Adult Children  Discharge Placement                       Discharge Plan and Services   Discharge Planning Services: CM Consult Post Acute Care Choice: Home Health                               Social Determinants of Health (SDOH) Interventions     Readmission Risk Interventions No flowsheet data found.

## 2019-09-28 NOTE — Progress Notes (Signed)
Went over discharge information w/ patient. Patient verbalized understanding.

## 2019-09-29 LAB — CULTURE, BLOOD (ROUTINE X 2)

## 2019-09-30 ENCOUNTER — Inpatient Hospital Stay (HOSPITAL_COMMUNITY): Payer: No Typology Code available for payment source

## 2019-09-30 ENCOUNTER — Encounter (HOSPITAL_COMMUNITY): Payer: Self-pay

## 2019-09-30 ENCOUNTER — Inpatient Hospital Stay (HOSPITAL_COMMUNITY)
Admission: EM | Admit: 2019-09-30 | Discharge: 2019-10-06 | DRG: 444 | Disposition: A | Discharge status: Skilled Nursing Facility | Payer: No Typology Code available for payment source | Attending: Internal Medicine | Admitting: Internal Medicine

## 2019-09-30 ENCOUNTER — Emergency Department (HOSPITAL_COMMUNITY): Payer: No Typology Code available for payment source

## 2019-09-30 ENCOUNTER — Other Ambulatory Visit: Payer: Self-pay

## 2019-09-30 DIAGNOSIS — C250 Malignant neoplasm of head of pancreas: Secondary | ICD-10-CM | POA: Diagnosis not present

## 2019-09-30 DIAGNOSIS — C259 Malignant neoplasm of pancreas, unspecified: Secondary | ICD-10-CM | POA: Diagnosis present

## 2019-09-30 DIAGNOSIS — R188 Other ascites: Secondary | ICD-10-CM | POA: Diagnosis present

## 2019-09-30 DIAGNOSIS — D638 Anemia in other chronic diseases classified elsewhere: Secondary | ICD-10-CM | POA: Diagnosis present

## 2019-09-30 DIAGNOSIS — T85590A Other mechanical complication of bile duct prosthesis, initial encounter: Secondary | ICD-10-CM | POA: Diagnosis present

## 2019-09-30 DIAGNOSIS — Z8249 Family history of ischemic heart disease and other diseases of the circulatory system: Secondary | ICD-10-CM | POA: Diagnosis not present

## 2019-09-30 DIAGNOSIS — B954 Other streptococcus as the cause of diseases classified elsewhere: Secondary | ICD-10-CM | POA: Diagnosis present

## 2019-09-30 DIAGNOSIS — K651 Peritoneal abscess: Secondary | ICD-10-CM | POA: Diagnosis present

## 2019-09-30 DIAGNOSIS — Z79899 Other long term (current) drug therapy: Secondary | ICD-10-CM | POA: Diagnosis not present

## 2019-09-30 DIAGNOSIS — R1310 Dysphagia, unspecified: Secondary | ICD-10-CM | POA: Diagnosis not present

## 2019-09-30 DIAGNOSIS — R1313 Dysphagia, pharyngeal phase: Secondary | ICD-10-CM | POA: Diagnosis present

## 2019-09-30 DIAGNOSIS — Z807 Family history of other malignant neoplasms of lymphoid, hematopoietic and related tissues: Secondary | ICD-10-CM | POA: Diagnosis not present

## 2019-09-30 DIAGNOSIS — R1311 Dysphagia, oral phase: Secondary | ICD-10-CM | POA: Diagnosis present

## 2019-09-30 DIAGNOSIS — T8189XA Other complications of procedures, not elsewhere classified, initial encounter: Secondary | ICD-10-CM

## 2019-09-30 DIAGNOSIS — Z832 Family history of diseases of the blood and blood-forming organs and certain disorders involving the immune mechanism: Secondary | ICD-10-CM

## 2019-09-30 DIAGNOSIS — K668 Other specified disorders of peritoneum: Secondary | ICD-10-CM | POA: Diagnosis not present

## 2019-09-30 DIAGNOSIS — D696 Thrombocytopenia, unspecified: Secondary | ICD-10-CM | POA: Diagnosis present

## 2019-09-30 DIAGNOSIS — I864 Gastric varices: Secondary | ICD-10-CM | POA: Diagnosis present

## 2019-09-30 DIAGNOSIS — Z66 Do not resuscitate: Secondary | ICD-10-CM | POA: Diagnosis present

## 2019-09-30 DIAGNOSIS — E876 Hypokalemia: Secondary | ICD-10-CM | POA: Diagnosis present

## 2019-09-30 DIAGNOSIS — R1011 Right upper quadrant pain: Secondary | ICD-10-CM | POA: Diagnosis present

## 2019-09-30 DIAGNOSIS — D649 Anemia, unspecified: Secondary | ICD-10-CM

## 2019-09-30 DIAGNOSIS — Z6821 Body mass index (BMI) 21.0-21.9, adult: Secondary | ICD-10-CM | POA: Diagnosis not present

## 2019-09-30 DIAGNOSIS — Z20822 Contact with and (suspected) exposure to covid-19: Secondary | ICD-10-CM | POA: Diagnosis present

## 2019-09-30 DIAGNOSIS — E440 Moderate protein-calorie malnutrition: Secondary | ICD-10-CM | POA: Diagnosis present

## 2019-09-30 DIAGNOSIS — R1314 Dysphagia, pharyngoesophageal phase: Secondary | ICD-10-CM | POA: Diagnosis present

## 2019-09-30 DIAGNOSIS — K838 Other specified diseases of biliary tract: Principal | ICD-10-CM | POA: Diagnosis present

## 2019-09-30 DIAGNOSIS — B3781 Candidal esophagitis: Secondary | ICD-10-CM | POA: Diagnosis present

## 2019-09-30 DIAGNOSIS — I10 Essential (primary) hypertension: Secondary | ICD-10-CM | POA: Diagnosis present

## 2019-09-30 DIAGNOSIS — R109 Unspecified abdominal pain: Secondary | ICD-10-CM | POA: Diagnosis present

## 2019-09-30 LAB — CBC WITH DIFFERENTIAL/PLATELET
Abs Immature Granulocytes: 0.17 10*3/uL — ABNORMAL HIGH (ref 0.00–0.07)
Basophils Absolute: 0 10*3/uL (ref 0.0–0.1)
Basophils Relative: 0 %
Eosinophils Absolute: 0 10*3/uL (ref 0.0–0.5)
Eosinophils Relative: 0 %
HCT: 26.3 % — ABNORMAL LOW (ref 39.0–52.0)
Hemoglobin: 8.3 g/dL — ABNORMAL LOW (ref 13.0–17.0)
Immature Granulocytes: 1 %
Lymphocytes Relative: 8 %
Lymphs Abs: 1.1 10*3/uL (ref 0.7–4.0)
MCH: 26.3 pg (ref 26.0–34.0)
MCHC: 31.6 g/dL (ref 30.0–36.0)
MCV: 83.2 fL (ref 80.0–100.0)
Monocytes Absolute: 1.2 10*3/uL — ABNORMAL HIGH (ref 0.1–1.0)
Monocytes Relative: 9 %
Neutro Abs: 10.8 10*3/uL — ABNORMAL HIGH (ref 1.7–7.7)
Neutrophils Relative %: 82 %
Platelets: 137 10*3/uL — ABNORMAL LOW (ref 150–400)
RBC: 3.16 MIL/uL — ABNORMAL LOW (ref 4.22–5.81)
RDW: 17.4 % — ABNORMAL HIGH (ref 11.5–15.5)
WBC: 13.3 10*3/uL — ABNORMAL HIGH (ref 4.0–10.5)
nRBC: 0 % (ref 0.0–0.2)

## 2019-09-30 LAB — COMPREHENSIVE METABOLIC PANEL
ALT: 15 U/L (ref 0–44)
AST: 20 U/L (ref 15–41)
Albumin: 2.4 g/dL — ABNORMAL LOW (ref 3.5–5.0)
Alkaline Phosphatase: 117 U/L (ref 38–126)
Anion gap: 14 (ref 5–15)
BUN: 22 mg/dL (ref 8–23)
CO2: 22 mmol/L (ref 22–32)
Calcium: 8.5 mg/dL — ABNORMAL LOW (ref 8.9–10.3)
Chloride: 104 mmol/L (ref 98–111)
Creatinine, Ser: 0.94 mg/dL (ref 0.61–1.24)
GFR calc Af Amer: >60 mL/min (ref >60)
GFR calc non Af Amer: >60 mL/min (ref >60)
Glucose, Bld: 132 mg/dL — ABNORMAL HIGH (ref 70–99)
Potassium: 3 mmol/L — ABNORMAL LOW (ref 3.5–5.1)
Sodium: 140 mmol/L (ref 135–145)
Total Bilirubin: 2.1 mg/dL — ABNORMAL HIGH (ref 0.3–1.2)
Total Protein: 6.1 g/dL — ABNORMAL LOW (ref 6.5–8.1)

## 2019-09-30 LAB — CBC
HCT: 24.1 % — ABNORMAL LOW (ref 39.0–52.0)
Hemoglobin: 7.6 g/dL — ABNORMAL LOW (ref 13.0–17.0)
MCH: 26.5 pg (ref 26.0–34.0)
MCHC: 31.5 g/dL (ref 30.0–36.0)
MCV: 84 fL (ref 80.0–100.0)
Platelets: 135 10*3/uL — ABNORMAL LOW (ref 150–400)
RBC: 2.87 MIL/uL — ABNORMAL LOW (ref 4.22–5.81)
RDW: 17.5 % — ABNORMAL HIGH (ref 11.5–15.5)
WBC: 10.3 10*3/uL (ref 4.0–10.5)
nRBC: 0 % (ref 0.0–0.2)

## 2019-09-30 LAB — CREATININE, SERUM
Creatinine, Ser: 0.92 mg/dL (ref 0.61–1.24)
GFR calc Af Amer: >60 mL/min (ref >60)
GFR calc non Af Amer: >60 mL/min (ref >60)

## 2019-09-30 LAB — SARS CORONAVIRUS 2 BY RT PCR (HOSPITAL ORDER, PERFORMED IN ~~LOC~~ HOSPITAL LAB): SARS Coronavirus 2: NEGATIVE

## 2019-09-30 LAB — PROTIME-INR
INR: 1.6 — ABNORMAL HIGH (ref 0.8–1.2)
Prothrombin Time: 18.2 seconds — ABNORMAL HIGH (ref 11.4–15.2)

## 2019-09-30 LAB — LACTIC ACID, PLASMA: Lactic Acid, Venous: 1 mmol/L (ref 0.5–1.9)

## 2019-09-30 MED ORDER — ONDANSETRON 4 MG PO TBDP: 4.0000 mg | ORAL_TABLET | Freq: Three times a day (TID) | ORAL | Status: DC | PRN

## 2019-09-30 MED ORDER — HYDROMORPHONE HCL 1 MG/ML IJ SOLN
1.0000 mg | Freq: Once | INTRAMUSCULAR | Status: AC
  Administered 2019-09-30: 1 mg via INTRAVENOUS
  Filled 2019-09-30: qty 1

## 2019-09-30 MED ORDER — IOHEXOL 300 MG/ML  SOLN
100.0000 mL | Freq: Once | INTRAMUSCULAR | Status: AC | PRN
  Administered 2019-09-30: 100 mL via INTRAVENOUS

## 2019-09-30 MED ORDER — MIDAZOLAM HCL 2 MG/2ML IJ SOLN
INTRAMUSCULAR | Status: AC | PRN
  Administered 2019-09-30: 1 mg via INTRAVENOUS

## 2019-09-30 MED ORDER — HYDROCORTISONE (PERIANAL) 2.5 % EX CREA
1.0000 "application " | TOPICAL_CREAM | Freq: Four times a day (QID) | CUTANEOUS | Status: DC | PRN
  Filled 2019-09-30: qty 28.35

## 2019-09-30 MED ORDER — ENOXAPARIN SODIUM 40 MG/0.4ML ~~LOC~~ SOLN
40.0000 mg | SUBCUTANEOUS | Status: DC
  Administered 2019-09-30 – 2019-10-06 (×7): 40 mg via SUBCUTANEOUS
  Filled 2019-09-30 (×7): qty 0.4

## 2019-09-30 MED ORDER — LIDOCAINE HCL (PF) 1 % IJ SOLN
INTRAMUSCULAR | Status: AC | PRN
  Administered 2019-09-30: 10 mL

## 2019-09-30 MED ORDER — ATENOLOL 100 MG PO TABS
100.0000 mg | ORAL_TABLET | Freq: Every day | ORAL | Status: DC
  Administered 2019-09-30 – 2019-10-06 (×7): 100 mg via ORAL
  Filled 2019-09-30: qty 1
  Filled 2019-09-30 (×4): qty 2
  Filled 2019-09-30: qty 1
  Filled 2019-09-30: qty 2
  Filled 2019-09-30 (×3): qty 1
  Filled 2019-09-30: qty 2
  Filled 2019-09-30 (×2): qty 1

## 2019-09-30 MED ORDER — ACETAMINOPHEN 650 MG RE SUPP: 650.0000 mg | Freq: Four times a day (QID) | RECTAL | Status: DC | PRN

## 2019-09-30 MED ORDER — POTASSIUM CHLORIDE CRYS ER 20 MEQ PO TBCR
40.0000 meq | EXTENDED_RELEASE_TABLET | Freq: Two times a day (BID) | ORAL | Status: AC
  Administered 2019-09-30 – 2019-10-01 (×2): 40 meq via ORAL
  Filled 2019-09-30 (×2): qty 2

## 2019-09-30 MED ORDER — MIDAZOLAM HCL 2 MG/2ML IJ SOLN
INTRAMUSCULAR | Status: AC
  Filled 2019-09-30: qty 4

## 2019-09-30 MED ORDER — TRAMADOL HCL 50 MG PO TABS
50.0000 mg | ORAL_TABLET | Freq: Three times a day (TID) | ORAL | Status: DC | PRN
  Administered 2019-09-30 – 2019-10-06 (×5): 50 mg via ORAL
  Filled 2019-09-30 (×5): qty 1

## 2019-09-30 MED ORDER — FENTANYL CITRATE (PF) 100 MCG/2ML IJ SOLN
INTRAMUSCULAR | Status: AC | PRN
  Administered 2019-09-30: 50 ug via INTRAVENOUS

## 2019-09-30 MED ORDER — PIPERACILLIN-TAZOBACTAM 3.375 G IVPB 30 MIN
3.3750 g | Freq: Once | INTRAVENOUS | Status: AC
  Administered 2019-09-30: 3.375 g via INTRAVENOUS
  Filled 2019-09-30: qty 50

## 2019-09-30 MED ORDER — CHLORHEXIDINE GLUCONATE CLOTH 2 % EX PADS
6.0000 | MEDICATED_PAD | Freq: Every day | CUTANEOUS | Status: DC
  Administered 2019-10-01 – 2019-10-06 (×6): 6 via TOPICAL

## 2019-09-30 MED ORDER — PANTOPRAZOLE SODIUM 40 MG PO TBEC
40.0000 mg | DELAYED_RELEASE_TABLET | Freq: Every day | ORAL | Status: DC
  Administered 2019-09-30 – 2019-10-06 (×7): 40 mg via ORAL
  Filled 2019-09-30 (×7): qty 1

## 2019-09-30 MED ORDER — SODIUM CHLORIDE 0.9 % IV SOLN: INTRAVENOUS | Status: DC

## 2019-09-30 MED ORDER — FENTANYL CITRATE (PF) 100 MCG/2ML IJ SOLN
INTRAMUSCULAR | Status: AC
  Filled 2019-09-30: qty 2

## 2019-09-30 MED ORDER — PIPERACILLIN-TAZOBACTAM 3.375 G IVPB
3.3750 g | Freq: Three times a day (TID) | INTRAVENOUS | Status: DC
  Administered 2019-09-30 – 2019-10-05 (×13): 3.375 g via INTRAVENOUS
  Filled 2019-09-30 (×13): qty 50

## 2019-09-30 MED ORDER — ONDANSETRON HCL 4 MG/2ML IJ SOLN
4.0000 mg | Freq: Once | INTRAMUSCULAR | Status: AC
  Administered 2019-09-30: 4 mg via INTRAVENOUS
  Filled 2019-09-30: qty 2

## 2019-09-30 MED ORDER — LOPERAMIDE HCL 2 MG PO CAPS: 2.0000 mg | ORAL_CAPSULE | Freq: Four times a day (QID) | ORAL | Status: DC | PRN

## 2019-09-30 MED ORDER — SODIUM CHLORIDE (PF) 0.9 % IJ SOLN
INTRAMUSCULAR | Status: AC
  Filled 2019-09-30: qty 50

## 2019-09-30 MED ORDER — METHOCARBAMOL 500 MG PO TABS
500.0000 mg | ORAL_TABLET | Freq: Four times a day (QID) | ORAL | Status: DC | PRN
  Administered 2019-10-02 – 2019-10-05 (×2): 500 mg via ORAL
  Filled 2019-09-30 (×2): qty 1

## 2019-09-30 MED ORDER — ACETAMINOPHEN 325 MG PO TABS
650.0000 mg | ORAL_TABLET | Freq: Four times a day (QID) | ORAL | Status: DC | PRN
  Administered 2019-09-30 – 2019-10-03 (×2): 650 mg via ORAL
  Filled 2019-09-30 (×2): qty 2

## 2019-09-30 NOTE — Progress Notes (Signed)
General surgery called about this patient overnight. He was seen previously by our service for possible cholecystectomy with known pancreatic cancer. He underwent biliary stent exchange and was treated with antibiotics. He now returns with large subcapsular liver abscess. IR has seen and placed percutaneous drain today. There is nothing surgical to offer this patient at this time, no other recommendations from a surgical standpoint. We will not follow, but are available if there are questions.  Norm Parcel , [facility] 09/30/2019, 1:04 PM Please see Amion for pager number during day hours 7:00am-4:30pm

## 2019-09-30 NOTE — ED Provider Notes (Signed)
[facility] DEPT Provider Note   CSN: [id number] Arrival date & time: 09/30/19  5462     History Chief Complaint  Patient presents with  . Abdominal Pain    Isaac Doyle is a 64 y.o. male.  Patient with past medical history notable for pancreatic cancer, recently admitted for cholangitis and had stent placement and ERCP presents to the emergency department with a chief complaint of abdominal pain.  He was just released 2 days ago from the hospital, but reports worsening abdominal pain and swelling.  He denies nausea, vomiting, or diarrhea.  He reports his only symptom has been pain and feeling of swelling or distention.  He denies any other associated symptoms.  States that his next chemotherapy is for Monday.  The history is provided by the patient. No language interpreter was used.       Past Medical History:  Diagnosis Date  . Hypertension     Patient Active Problem List   Diagnosis Date Noted  . Elevated LFTs   . RUQ abdominal pain   . Severe sepsis with acute organ dysfunction ([place]) 09/23/2019  . 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 Anemia of chronic disease 09/23/2019  . Cholangitis 09/23/2019  . Acute cholecystitis   . Port-A-Cath in place 08/17/2019  . Symptomatic anemia 08/08/2019  . Malignant obstructive jaundice ([place]) 06/03/2019  . Malignant neoplasm of pancreas ([place])   . ARF (acute renal failure) ([place]) 05/10/2019  . Essential hypertension 05/10/2019  . Hypokalemia 05/10/2019  . Normochromic normocytic anemia 05/10/2019  . Malignant biliary obstruction ([place])   . Jaundice 05/09/2019    Past Surgical History:  Procedure Laterality Date  . BILIARY STENT PLACEMENT  05/11/2019   Procedure: BILIARY STENT PLACEMENT;  Surgeon: Irene Shipper, MD;  Location: [facility] ENDOSCOPY;  Service: Endoscopy;;  . BILIARY STENT PLACEMENT  06/03/2019   Procedure: BILIARY STENT PLACEMENT;  Surgeon: Jackquline Denmark,  MD;  Location: [facility] ENDOSCOPY;  Service: Endoscopy;;  . BILIARY STENT PLACEMENT N/A 09/24/2019   Procedure: BILIARY STENT PLACEMENT;  Surgeon: Ladene Artist, MD;  Location: [facility] ENDOSCOPY;  Service: Endoscopy;  Laterality: N/A;  . ERCP N/A 05/11/2019   Procedure: ENDOSCOPIC RETROGRADE CHOLANGIOPANCREATOGRAPHY (ERCP);  Surgeon: Irene Shipper, MD;  Location: [facility] ENDOSCOPY;  Service: Endoscopy;  Laterality: N/A;  with   . ERCP N/A 06/03/2019   Procedure: ENDOSCOPIC RETROGRADE CHOLANGIOPANCREATOGRAPHY (ERCP);  Surgeon: Jackquline Denmark, MD;  Location: [facility] ENDOSCOPY;  Service: Endoscopy;  Laterality: N/A;  . ERCP N/A 09/24/2019   Procedure: ENDOSCOPIC RETROGRADE CHOLANGIOPANCREATOGRAPHY (ERCP);  Surgeon: Ladene Artist, MD;  Location: [facility] ENDOSCOPY;  Service: Endoscopy;  Laterality: N/A;  . ESOPHAGOGASTRODUODENOSCOPY (EGD) WITH PROPOFOL N/A 05/19/2019   Procedure: ESOPHAGOGASTRODUODENOSCOPY (EGD) WITH PROPOFOL;  Surgeon: [name], MD;  Location: [facility] ENDOSCOPY;  Service: Endoscopy;  Laterality: N/A;  . EUS N/A 05/19/2019   Procedure: UPPER ENDOSCOPIC ULTRASOUND (EUS) LINEAR;  Surgeon: [name], MD;  Location: [facility] ENDOSCOPY;  Service: Endoscopy;  Laterality: N/A;  . FINE NEEDLE ASPIRATION N/A 05/19/2019   Procedure: FINE NEEDLE ASPIRATION (FNA) LINEAR;  Surgeon: [name], MD;  Location: [facility] ENDOSCOPY;  Service: Endoscopy;  Laterality: N/A;  . IR IMAGING GUIDED PORT INSERTION  06/08/2019  . REMOVAL OF STONES  09/24/2019   Procedure: REMOVAL OF STONES;  Surgeon: Ladene Artist, MD;  Location: [facility] ENDOSCOPY;  Service: Endoscopy;;  . [name]  05/11/2019   Procedure: SPHINCTEROTOMY;  Surgeon: Irene Shipper, MD;  Location: [facility] ENDOSCOPY;  Service: Endoscopy;;  . STENT REMOVAL  06/03/2019   Procedure: STENT REMOVAL;  Surgeon: Jackquline Denmark, MD;  Location: [facility] ENDOSCOPY;  Service: Endoscopy;;  . [name] REMOVAL  09/24/2019   Procedure: STENT REMOVAL;  Surgeon: Ladene Artist, MD;  Location: [facility]  ENDOSCOPY;  Service: Endoscopy;;       Family History  Problem Relation Age of Onset  . Lymphoma Mother 83       cancer in lymph nodes, unsure of primary  . Heart attack Father   . Lupus Sister     Social History   Tobacco Use  . Smoking status: Never Smoker  . Smokeless tobacco: Never Used  . Tobacco comment: plan to quit completely   Substance Use Topics  . Alcohol use: Yes    Alcohol/week: 22.0 standard drinks    Types: 12 Cans of beer, 10 Shots of liquor per week  . Drug use: Not Currently    Home Medications Prior to Admission medications   Medication Sig Start Date End Date Taking? Authorizing Provider  amoxicillin-clavulanate (AUGMENTIN) 875-125 MG tablet Take 1 tablet by mouth 2 (two) times daily for 10 days. 09/28/19 10/08/19  Rai, Vernelle Emerald, MD  atenolol (TENORMIN) 100 MG tablet Take 1 tablet (100 mg total) by mouth daily. Hold until follow-up with your PCP 09/28/19   Rai, Vernelle Emerald, MD  cetirizine (ZYRTEC) 10 MG tablet Take 10 mg by mouth daily.    [provider]  diphenoxylate-atropine (LOMOTIL) 2.5-0.025 MG tablet Take 1-2 tablets by mouth 4 (four) times daily as needed for diarrhea or loose stools. Patient taking differently: Take 2 tablets by mouth 4 (four) times daily as needed for diarrhea or loose stools.  07/14/19   Truitt Merle, MD  guaifenesin (HUMIBID E) 400 MG TABS tablet Take 1 tablet (400 mg total) by mouth every 6 (six) hours as needed. Patient taking differently: Take 400 mg by mouth in the morning and at bedtime.  05/13/19   Geradine Girt, DO  hydrocortisone (ANUSOL-HC) 2.5 % rectal cream Apply 1 application topically 4 (four) times daily as needed for hemorrhoids. 09/28/19   Rai, Vernelle Emerald, MD  lidocaine-prilocaine (EMLA) cream Apply 1 application topically as needed. Patient taking differently: Apply 1 application topically as needed (To access the port).  06/23/19   Truitt Merle, MD  loperamide (IMODIUM) 2 MG capsule Take 1-2 capsules (2-4 mg  total) by mouth every 6 (six) hours as needed for diarrhea or loose stools. 07/14/19   Truitt Merle, MD  magnesium oxide (MAG-OX) 400 (241.3 Mg) MG tablet Take 1 tablet (400 mg total) by mouth daily. Patient taking differently: Take 400 mg by mouth every other day.  05/14/19   Geradine Girt, DO  methocarbamol (ROBAXIN) 500 MG tablet Take 1 tablet (500 mg total) by mouth every 6 (six) hours as needed for muscle spasms. 09/28/19   Rai, Ripudeep Raliegh Ip, MD  ondansetron (ZOFRAN ODT) 4 MG disintegrating tablet Take 1 tablet (4 mg total) by mouth every 8 (eight) hours as needed for nausea or vomiting. 09/28/19   Rai, Ripudeep K, MD  pantoprazole (PROTONIX) 40 MG tablet Take 1 tablet (40 mg total) by mouth daily. 09/28/19 01/26/20  Rai, Ripudeep K, MD  potassium chloride SA (KLOR-CON) 20 MEQ tablet Take 2 tablets (40 mEq total) by mouth daily. 08/30/19   Truitt Merle, MD  traMADol (ULTRAM) 50 MG tablet Take 1 tablet (50 mg total) by mouth every 8 (eight) hours as needed for  moderate pain or severe pain. 09/28/19 09/27/20  Mendel Corning, MD    Allergies    Patient has no known allergies.  Review of Systems   Review of Systems  All other systems reviewed and are negative.   Physical Exam Updated Vital Signs BP 131/86 (BP Location: Left Arm) Comment: Simultaneous filing. User may not have seen previous data. Comment (BP Location): Simultaneous filing. User may not have seen previous data.  Pulse (!) 102 Comment: Simultaneous filing. User may not have seen previous data.  Temp 97.8 F (36.6 C) (Oral) Comment: Simultaneous filing. User may not have seen previous data. Comment (Src): Simultaneous filing. User may not have seen previous data.  Resp 20 Comment: Simultaneous filing. User may not have seen previous data.  Ht 5\' 11"  (1.803 m)   Wt 69.4 kg   SpO2 100% Comment: Simultaneous filing. User may not have seen previous data.  BMI 21.34 kg/m   Physical Exam Vitals and nursing note reviewed.  Constitutional:       Appearance: He is well-developed.  HENT:     Head: Normocephalic and atraumatic.  Eyes:     Conjunctiva/sclera: Conjunctivae normal.  Cardiovascular:     Rate and Rhythm: Normal rate and regular rhythm.     Heart sounds: No murmur heard.   Pulmonary:     Effort: Pulmonary effort is normal. No respiratory distress.     Breath sounds: Normal breath sounds.  Abdominal:     Palpations: Abdomen is soft.     Tenderness: There is abdominal tenderness.  Musculoskeletal:        General: Normal range of motion.     Cervical back: Neck supple.  Skin:    General: Skin is warm and dry.  Neurological:     Mental Status: He is alert and oriented to person, place, and time.  Psychiatric:        Mood and Affect: Mood normal.        Behavior: Behavior normal.     ED Results / Procedures / Treatments   Labs (all labs ordered are listed, but only abnormal results are displayed) Labs Reviewed  CBC WITH DIFFERENTIAL/PLATELET  COMPREHENSIVE METABOLIC PANEL  LACTIC ACID, PLASMA    EKG None  Radiology No results found.  Procedures Procedures (including critical care time)  Medications Ordered in ED Medications  HYDROmorphone (DILAUDID) injection 1 mg (has no administration in time range)  ondansetron (ZOFRAN) injection 4 mg (has no administration in time range)    ED Course  I have reviewed the triage vital signs and the nursing notes.  Pertinent labs & imaging results that were available during my care of the patient were reviewed by me and considered in my medical decision making (see chart for details).    MDM Rules/Calculators/A&P                          This patient complains of abdominal pain in the setting of recent cholangitis, current pancreatic cancer, this involves an extensive number of treatment options, and is a complaint that carries with it a high risk of complications and morbidity.    Pertinent Labs I ordered, reviewed, and interpreted labs, which  included WBC 13.3, HGB 8.3 (stable), K 3.0, LFTs normal, T. Bili 2.1.  Imaging Interpretation I ordered imaging studies which included CT ab/pel, which showed large fluid collection around the liver concerning for abscess or biloma.   Medications I ordered medication dilaudid and zofran  for pain and nausea.  Sources Previous records obtained and reviewed recent admit for cholangitis and ERCP with stent replacement.  Reassessments After the interventions stated above, I reevaluated the patient and found with somewhat improved pain and agreeable with plan for admission.  Consultants  Appreciate telephone consult from Dr. Ninfa Linden - General Surgery - Recommends medicine admit and IR for drain placement.  States no surgical intervention indicated at this time.  Case discussed with Dr. Hal Hope, who is appreciated for bringing the patient into the hospital.  Will also need IR for drain placement.  Plan Admit to medicine.    Final Clinical Impression(s) / ED Diagnoses Final diagnoses:  Right upper quadrant abdominal pain  Intraabdominal fluid collection    Rx / DC Orders ED Discharge Orders    None       Maruice, [place], PA-C 09/30/19 [id number]    Ripley Fraise, MD 09/30/19 [phone]

## 2019-09-30 NOTE — Procedures (Signed)
Interventional Radiology Procedure Note  Procedure: Image guided drain placement, RUQ, biloma.  44F pigtail drain.  Complications: None  EBL: None Sample: Culture sent  Recommendations: - Routine drain care, with sterile flushes, record output - follow up Cx - routine wound care  Signed,  Dulcy Fanny. Earleen Newport, DO

## 2019-09-30 NOTE — ED Provider Notes (Signed)
Patient seen/examined in the Emergency Department in conjunction with Advanced Practice Provider [facility] Patient reports right sided abdominal pain.  Pt with recent admission for cholangitis/ERCP and biliary stent replacement Exam : awake/alert, uncomfortable appearing, mild RUQ tenderness Scleral icterus noted Plan: labs/imaging pending.  Anticipate admission     Ripley Fraise, MD 09/30/19 [phone]

## 2019-09-30 NOTE — Progress Notes (Signed)
Referring Physician(s): Chiu,S  Supervising Physician: Corrie Mckusick  Patient Status:  [facility] ED TBA  Chief Complaint:  Abdominal pain  Subjective: Patient service for Port-A-Cath placement on 06/08/2019.  He has a history of pancreatic carcinoma with previous biliary stent placement in April of this year.  He was recently admitted to [facility] secondary to sepsis/cholangitis and underwent biliary stent exchange on 09/24/2019.  He was discharged home on 8/25.  He now presents to [facility] ED with persistent abdominal pain.  CT of the abdomen pelvis today revealed:  1. 20 x 6 x 15 cm peritoneal or subcapsular organized collection along the right liver with a few bubbles of gas. The collection has developed rapidly and is likely an abscess or biloma. 2. Moderate ascites in the lower abdomen with smooth peritoneal thickening in the pelvis, new from prior and attributed to peritonitis rather than malignancy. 3. Recently replaced biliary stent which appears patent with diffuse gas filling. 4. Cholelithiasis. Improved but persistent inflammatory changes at the gallbladder wall. 5. 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  Request received from [facility] for abdominal fluid collection drain placement/rule out biloma.  Patient currently denies headache, significant chest pain, dyspnea, back pain, nausea, vomiting or bleeding.  He does have some increased oral secretions.  Past Medical History:  Diagnosis Date   Hypertension    Past Surgical History:  Procedure Laterality Date   BILIARY STENT PLACEMENT  05/11/2019   Procedure: BILIARY STENT PLACEMENT;  Surgeon: Irene Shipper, MD;  Location: [facility] ENDOSCOPY;  Service: Endoscopy;;   BILIARY STENT PLACEMENT  06/03/2019   Procedure: BILIARY STENT PLACEMENT;   Surgeon: Jackquline Denmark, MD;  Location: [facility] ENDOSCOPY;  Service: Endoscopy;;   BILIARY STENT PLACEMENT N/A 09/24/2019   Procedure: BILIARY STENT PLACEMENT;  Surgeon: Ladene Artist, MD;  Location: [facility] ENDOSCOPY;  Service: Endoscopy;  Laterality: N/A;   ERCP N/A 05/11/2019   Procedure: ENDOSCOPIC RETROGRADE CHOLANGIOPANCREATOGRAPHY (ERCP);  Surgeon: Irene Shipper, MD;  Location: [facility] ENDOSCOPY;  Service: Endoscopy;  Laterality: N/A;  with    ERCP N/A 06/03/2019   Procedure: ENDOSCOPIC RETROGRADE CHOLANGIOPANCREATOGRAPHY (ERCP);  Surgeon: Jackquline Denmark, MD;  Location: [facility] ENDOSCOPY;  Service: Endoscopy;  Laterality: N/A;   ERCP N/A 09/24/2019   Procedure: ENDOSCOPIC RETROGRADE CHOLANGIOPANCREATOGRAPHY (ERCP);  Surgeon: Ladene Artist, MD;  Location: [facility] ENDOSCOPY;  Service: Endoscopy;  Laterality: N/A;   ESOPHAGOGASTRODUODENOSCOPY (EGD) WITH PROPOFOL N/A 05/19/2019   Procedure: ESOPHAGOGASTRODUODENOSCOPY (EGD) WITH PROPOFOL;  Surgeon: [name], MD;  Location: [facility] ENDOSCOPY;  Service: Endoscopy;  Laterality: N/A;   EUS N/A 05/19/2019   Procedure: UPPER ENDOSCOPIC ULTRASOUND (EUS) LINEAR;  Surgeon: [name], MD;  Location: [facility] ENDOSCOPY;  Service: Endoscopy;  Laterality: N/A;   FINE NEEDLE ASPIRATION N/A 05/19/2019   Procedure: FINE NEEDLE ASPIRATION (FNA) LINEAR;  Surgeon: [name], MD;  Location: [facility] ENDOSCOPY;  Service: Endoscopy;  Laterality: N/A;   IR IMAGING GUIDED PORT INSERTION  06/08/2019   REMOVAL OF STONES  09/24/2019   Procedure: REMOVAL OF STONES;  Surgeon: Ladene Artist, MD;  Location: [facility] ENDOSCOPY;  Service: Endoscopy;;   SPHINCTEROTOMY  05/11/2019   Procedure: [name];  Surgeon: Irene Shipper, MD;  Location: [facility] ENDOSCOPY;  Service: Endoscopy;;   STENT REMOVAL  06/03/2019   Procedure: STENT REMOVAL;  Surgeon: Jackquline Denmark, MD;  Location:  [place] ENDOSCOPY;  Service: Endoscopy;;   STENT REMOVAL  09/24/2019   Procedure: STENT REMOVAL;  Surgeon: Ladene Artist,  MD;  Location: [facility] ENDOSCOPY;  Service: Endoscopy;;      Allergies: Patient has no known allergies.  Medications: Prior to Admission medications   Medication Sig Start Date End Date Taking? Authorizing Provider  amoxicillin-clavulanate (AUGMENTIN) 875-125 MG tablet Take 1 tablet by mouth 2 (two) times daily for 10 days. 09/28/19 10/08/19 Yes Rai, Ripudeep K, MD  atenolol (TENORMIN) 100 MG tablet Take 1 tablet (100 mg total) by mouth daily. Hold until follow-up with your PCP Patient taking differently: Take 100 mg by mouth daily.  09/28/19  Yes Rai, Ripudeep K, MD  cetirizine (ZYRTEC) 10 MG tablet Take 10 mg by mouth daily.   Yes [provider]  diphenoxylate-atropine (LOMOTIL) 2.5-0.025 MG tablet Take 1-2 tablets by mouth 4 (four) times daily as needed for diarrhea or loose stools. Patient taking differently: Take 2 tablets by mouth 4 (four) times daily as needed for diarrhea or loose stools.  07/14/19  Yes Truitt Merle, MD  guaifenesin (HUMIBID E) 400 MG TABS tablet Take 1 tablet (400 mg total) by mouth every 6 (six) hours as needed. Patient taking differently: Take 400 mg by mouth in the morning and at bedtime.  05/13/19  Yes Geradine Girt, DO  hydrocortisone (ANUSOL-HC) 2.5 % rectal cream Apply 1 application topically 4 (four) times daily as needed for hemorrhoids. 09/28/19  Yes Rai, Ripudeep K, MD  lidocaine-prilocaine (EMLA) cream Apply 1 application topically as needed. Patient taking differently: Apply 1 application topically as needed (To access the port).  06/23/19  Yes Truitt Merle, MD  loperamide (IMODIUM) 2 MG capsule Take 1-2 capsules (2-4 mg total) by mouth every 6 (six) hours as needed for diarrhea or loose stools. 07/14/19  Yes Truitt Merle, MD  magnesium oxide (MAG-OX) 400 (241.3 Mg) MG tablet Take 1 tablet (400 mg total) by mouth daily. Patient taking differently: Take 400 mg by mouth every other day.  05/14/19  Yes Geradine Girt, DO  methocarbamol (ROBAXIN) 500 MG tablet Take 1  tablet (500 mg total) by mouth every 6 (six) hours as needed for muscle spasms. 09/28/19  Yes Rai, Ripudeep K, MD  ondansetron (ZOFRAN ODT) 4 MG disintegrating tablet Take 1 tablet (4 mg total) by mouth every 8 (eight) hours as needed for nausea or vomiting. 09/28/19  Yes Rai, Ripudeep K, MD  pantoprazole (PROTONIX) 40 MG tablet Take 1 tablet (40 mg total) by mouth daily. 09/28/19 01/26/20 Yes Rai, Ripudeep K, MD  potassium chloride SA (KLOR-CON) 20 MEQ tablet Take 2 tablets (40 mEq total) by mouth daily. 08/30/19  Yes Truitt Merle, MD  traMADol (ULTRAM) 50 MG tablet Take 1 tablet (50 mg total) by mouth every 8 (eight) hours as needed for moderate pain or severe pain. 09/28/19 09/27/20 Yes Rai, Ripudeep K, MD     Vital Signs: BP 128/83    Pulse 94    Temp 97.8 F (36.6 C) (Oral) Comment: Simultaneous filing. User may not have seen previous data. Comment (Src): Simultaneous filing. User may not have seen previous data.   Resp (!) 22    Ht 5\' 11"  (1.803 m)    Wt 153 lb (69.4 kg)    SpO2 100%    BMI 21.34 kg/m   Physical Exam awake, alert.  Daughter in room.  Right chest wall Port-A-Cath in place, site clean and dry.  Chest with slightly diminished breath sounds  bases.  Heart with slightly tachycardic but regular rhythm.  Abdomen soft, few bowel sounds, slightly distended, right abdominal tenderness to palpation.  No lower extremity edema.  Imaging: CT ABDOMEN PELVIS W CONTRAST  Result Date: 09/30/2019 CLINICAL DATA:  Abdominal distension for 4 days. Pancreas cancer with recent ERCP EXAM: CT ABDOMEN AND PELVIS WITH CONTRAST TECHNIQUE: Multidetector CT imaging of the abdomen and pelvis was performed using the standard protocol following bolus administration of intravenous contrast. CONTRAST:  162mL OMNIPAQUE IOHEXOL 300 MG/ML  SOLN COMPARISON:  Seven days ago FINDINGS: Lower chest:  Trace pleural effusions.  No acute finding Hepatobiliary: Pneumobilia in the setting of pancreatic stent, which is patent and  diffusely air-filled. No liver mass is seen. Liver scalloping described below.Cholelithiasis and gallbladder distension with mild thickening and regional fat stranding, also seen previously. Pancreas: Hypodensity in loss of architecture in the midline body correlating with the history. No evidence of acute pancreatitis Spleen: Enlarged in the setting of portal venous narrowing. Adrenals/Urinary Tract: Negative adrenals. Small kidneys. No hydronephrosis. Bilateral renal cystic densities. Unremarkable bladder. Stomach/Bowel:  No obstruction. No evidence of bowel inflammation. Vascular/Lymphatic: No acute vascular finding. There is portal confluence narrowing and gastric varices. No mass or adenopathy. Reproductive:No pathologic findings. Other: Interval development of organized peritoneal collection lateral to the liver which measures 20 x 6 cm in contains a few bubbles of gas. The collection is rim enhancing. There is new ascites in the gutters and moderate peritoneal collection in the pelvis with detectable wall thickening. These have occurred rapidly, more consistent with inflammatory collections than a manifestation of peritoneal tumor spread. Musculoskeletal: Degenerative changes without acute finding. 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 IMPRESSION: 1. 20 x 6 x 15 cm peritoneal or subcapsular organized collection along the right liver with a few bubbles of gas. The collection has developed rapidly and is likely an abscess or biloma. 2. Moderate ascites in the lower abdomen with smooth peritoneal thickening in the pelvis, new from prior and attributed to peritonitis rather than malignancy. 3. Recently replaced biliary stent which appears patent with diffuse gas filling. 4. Cholelithiasis. Improved but persistent inflammatory changes at the gallbladder wall. 5. 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 Electronically Signed   By: Monte Fantasia M.D.   On:  09/30/2019 06:19   DG CHEST PORT 1 VIEW  Result Date: 09/26/2019 CLINICAL DATA:  Shortness of breath EXAM: PORTABLE CHEST 1 VIEW COMPARISON:  09/23/2019 FINDINGS: Right Port-A-Cath remains in place, unchanged. Bibasilar airspace opacities, favor atelectasis. Small bilateral effusions suspected. Heart is normal size. No edema. No acute bony abnormality. IMPRESSION: Small bilateral pleural effusions with bibasilar opacities, likely atelectasis. Electronically Signed   By: Rolm Baptise M.D.   On: 09/26/2019 18:28    Labs:  CBC: Recent Labs    09/26/19 0455 09/26/19 0455 09/26/19 1201 09/27/19 0442 09/28/19 0446 09/30/19 0445  WBC 18.2*  --   --  13.3* 11.2* 13.3*  HGB 8.2*   < > 8.4* 8.3* 8.0* 8.3*  HCT 25.3*   < > 26.0* 25.7* 25.3* 26.3*  PLT 108*  --   --  87* 86* 137*   < > = values in this interval not displayed.    COAGS: Recent Labs    05/11/19 0516 06/03/19 0146 09/23/19 0930 09/24/19 0418  INR 1.3* 1.0 1.4* 1.7*    BMP: Recent Labs    09/26/19 0455 09/27/19 0442 09/28/19 0446 09/30/19 0445  NA 137 134* 139 140  K 3.9 3.7  3.4* 3.0*  CL 108 105 107 104  CO2 18* 18* 20* 22  GLUCOSE 134* 109* 117* 132*  BUN 47* 42* 35* 22  CALCIUM 7.6* 7.7* 8.1* 8.5*  CREATININE 2.16* 1.73* 1.44* 0.94  GFRNONAA 31* 41* 51* >60  GFRAA 36* 47* 59* >60    LIVER FUNCTION TESTS: Recent Labs    09/25/19 0443 09/26/19 0455 09/27/19 0442 09/30/19 0445  BILITOT 4.2* 2.2* 2.5* 2.1*  AST 23 22 23 20   ALT 20 20 19 15   ALKPHOS 112 101 104 117  PROT 5.4* 5.4* 5.5* 6.1*  ALBUMIN 2.4* 2.1* 2.0* 2.4*    Assessment and Plan: history of pancreatic carcinoma with previous biliary stent placement in April of this year.  He was recently admitted to [facility] secondary to sepsis/cholangitis and underwent biliary stent exchange on 09/24/2019.  He was discharged home on 8/25.  He now presents to [facility] ED with persistent abdominal pain.  CT of the abdomen pelvis today  revealed:  1. 20 x 6 x 15 cm peritoneal or subcapsular organized collection along the right liver with a few bubbles of gas. The collection has developed rapidly and is likely an abscess or biloma. 2. Moderate ascites in the lower abdomen with smooth peritoneal thickening in the pelvis, new from prior and attributed to peritonitis rather than malignancy. 3. Recently replaced biliary stent which appears patent with diffuse gas filling. 4. Cholelithiasis. Improved but persistent inflammatory changes at the gallbladder wall. 5. 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  Request received from [facility] for abdominal fluid collection drain placement/rule out biloma.  Latest imaging studies have been reviewed by Dr. Earleen Newport.  Details/risks of procedure, including not limited to, internal bleeding, infection, injury to adjacent structures discussed with patient and daughter with their understanding and consent.  Procedure scheduled for later today.   Electronically Signed: D. Rowe Nash, PA-C 09/30/2019, 10:21 AM   I spent a total of 30 minutes at the the patient's bedside AND on the patient's hospital floor or unit, greater than 50% of which was counseling/coordinating care for CT-guided abdominal fluid collection drain placement   Patient ID: Isaac Doyle, male   DOB: [birth year], 64 y.o.   MRN: [id number]

## 2019-09-30 NOTE — Progress Notes (Signed)
HEMATOLOGY-ONCOLOGY PROGRESS NOTE  SUBJECTIVE: [name] came to the emergency room secondary to abdominal pain.  He was just discharged from the hospital on 09/28/2019.  Labs in the ER showed a WBC of 13.3, hemoglobin 8.3, and platelet count of 137,000.  His potassium of 3.0 and total bilirubin was 2.1.  T bili has trended downward since hospital discharge.  A CT of the abdomen and pelvis was performed which showed a 20 x 6 x 15 cm peritoneal or subcapsular organized collection along the right liver with a few bubbles of gas, the collection has developed rapidly and is likely an abscess or biloma.  He also has moderate ascites.  The recently placed biliary stent appears patent.  ED provider discussed case with general surgery who recommended medical admission with IR consult for drain placement.  IR evaluation is pending.  Patient denied having any fevers or chills.  Reports right upper quadrant abdominal pain.  He does not have any nausea or vomiting.  He is unsure if he is having any constipation or diarrhea.  Denied any bleeding.  Oncology History Overview Note  Cancer Staging Malignant neoplasm of pancreas [facility]) Staging form: Exocrine Pancreas, AJCC 8th Edition - Clinical stage from 05/19/2019: Stage III (cT4, cN1, cM0) - Signed by Isaac Merle, MD on 05/24/2019    Malignant neoplasm of pancreas ([place])  05/10/2019 Tumor Marker   Baseline  CEA at 31.1 Ca 19-9 at 2411   05/11/2019 Procedure   ERCP by Dr Isaac Doyle 05/11/19  IMPRESSION 1. Malignant appearing distal bile duct stricture with upstream dilation. Status post ERCP with sphincterotomy and biliary stent placement   05/13/2019 Imaging   CT Chest and Pancreas 05/13/19 IMPRESSION: 1. Interval placement of common bile duct stent with decompression of the bile ducts. Pneumobilia is now noted compatible with biliary patency. 2. Diffuse infiltrative process involving the head, neck, body and tail of pancreas is identified. The diffusely  infiltrative appearance of the pancreas is somewhat unusual. Although favored to represent pancreatic adenocarcinoma, other etiologies to consider include IgG4-related Sclerosing Disease of the pancreas. 3. There is encasement and narrowing of the portal venous confluence and proximal portal vein. Mild soft tissue stranding extends to but does not encase the superior mesenteric artery. No convincing evidence for involvement of the celiac trunk. 4. Borderline enlarged portacaval node. No convincing evidence for liver metastasis or metastatic disease to the chest. 5. Aortic atherosclerosis. Aortic Atherosclerosis (ICD10-I70.0).   05/19/2019 Initial Diagnosis   Malignant neoplasm of pancreas ([place])   05/19/2019 Cancer Staging   Staging form: Exocrine Pancreas, AJCC 8th Edition - Clinical stage from 05/19/2019: Stage III (cT4, cN1, cM0) - Signed by Isaac Merle, MD on 05/24/2019   05/19/2019 Procedure   EUS by Dr Isaac Doyle 05/19/19 - Large mass involving much of the pancreatic parenchyma, clear encasing the PV and possibly involving the SMA as well. The mass was sampled with 3 transduodenal EUS FNB passes and the preliminary cytology was positive for malignancy (adenocarcinoma). - Previously placed plastic biliary stent was in the CBD in good position.    05/19/2019 Initial Biopsy   A. 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   06/08/2019 Procedure   PAC placed    06/16/2019 -  Chemotherapy   FOLFIRINOX q2weeks starting 06/16/19   08/04/2019 Imaging  US Abdomen  IMPRESSION: 1. Gallbladder mildly distended with sludge and tiny gallstones. No gallbladder wall thickening or pericholecystic fluid.   2. Biliary stent present with pneumobilia, likely due to stent present.   3. Much of  pancreas obscured by gas. Visualized portions of pancreas appear grossly unremarkable.   4. Increased renal echogenicity, likely indicative of medical renal disease. No obstructing focus in either kidney. Cysts noted in each kidney.   08/10/2019 Imaging   MRI  IMPRESSION: 1. Infiltrative hypoenhancement in the pancreatic body, most of the pancreatic tail, and extending into a significant portion of the pancreatic head. This appearance in combination with the abnormal cytology on prior FNA is suspicious for an infiltrative pancreatic cancer involving most of the parenchyma of the pancreas, with sparing of the tip of the pancreatic tail and a small portion of the pancreatic head. 2. The common hepatic duct and common bile duct is obscured by low signal over an approximately 4.7 cm segment, although the lack of intrahepatic biliary dilatation suggests that the stent is still in place and functional. 3. Cholelithiasis with mild gallbladder wall thickening. There is some accentuation of enhancement along the gallbladder fossa which can sometimes correlate with gallbladder inflammation causing local hyperemia. 4. Trace ascites.      REVIEW OF SYSTEMS:   Constitutional: Denies fevers, chills  Respiratory: Denies cough, dyspnea or wheezes Cardiovascular: Denies palpitation, chest discomfort Gastrointestinal: Reports right upper quadrant abdominal pain, denies nausea and vomiting Skin: Denies abnormal skin rashes Lymphatics: Denies new lymphadenopathy or easy bruising Neurological:Denies numbness, tingling or new weaknesses Behavioral/Psych: Mood is stable, no new changes  Extremities: No lower extremity edema All other systems were reviewed with the patient and are negative.  I have reviewed the past medical history, past surgical history, social history and family history with the patient and they are unchanged from previous note.   PHYSICAL EXAMINATION: ECOG PERFORMANCE STATUS:  1 - Symptomatic but completely ambulatory  Vitals:   09/30/19 0700 09/30/19 0722  BP: 123/72 123/72  Pulse:  88  Resp: 16 17  Temp:    SpO2: 100% 100%   Filed Weights   09/30/19 0431  Weight: 69.4 kg    Intake/Output from previous day: No intake/output data recorded.  GENERAL:alert, no distress and comfortable SKIN: skin color, texture, turgor are normal, no rashes or significant lesions EYES: Mild scleral icterus OROPHARYNX:no exudate, no erythema and lips, buccal mucosa, and tongue normal  LUNGS: clear to auscultation and percussion with normal breathing effort HEART: regular rate & rhythm and no murmurs and no lower extremity edema ABDOMEN: Positive bowel sounds, tenderness over the right upper quadrant, nondistended NEURO: alert & oriented x 3 with fluent speech, no focal motor/sensory deficits  LABORATORY DATA:  I have reviewed the data as listed CMP Latest Ref Rng & Units 09/30/2019 09/28/2019 09/27/2019  Glucose 70 - 99 mg/dL 132(H) 117(H) 109(H)  BUN 8 - 23 mg/dL 22 35(H) 42(H)  Creatinine 0.61 - 1.24 mg/dL 0.94 1.44(H) 1.73(H)  Sodium 135 - 145 mmol/L 140 139 134(L)  Potassium 3.5 - 5.1 mmol/L 3.0(L) 3.4(L) 3.7  Chloride 98 - 111 mmol/L 104 107 105  CO2 22 - 32 mmol/L 22 20(L) 18(L)  Calcium 8.9 - 10.3 mg/dL 8.5(L) 8.1(L) 7.7(L)  Total Protein 6.5 - 8.1 g/dL 6.1(L) - 5.5(L)  Total Bilirubin 0.3 - 1.2 mg/dL 2.1(H) - 2.5(H)  Alkaline Phos 38 - 126 U/L 117 - 104  AST 15 - 41 U/L 20 - 23  ALT 0 -  44 U/L 15 - 19    Lab Results  Component Value Date   WBC 13.3 (H) 09/30/2019   HGB 8.3 (L) 09/30/2019   HCT 26.3 (L) 09/30/2019   MCV 83.2 09/30/2019   PLT 137 (L) 09/30/2019   NEUTROABS 10.8 (H) 09/30/2019    CT ABDOMEN PELVIS WO CONTRAST  Result Date: 09/23/2019 CLINICAL DATA:  Nausea and vomiting. Weakness and hypertension for 1 week. History of pancreatic cancer. EXAM: CT ABDOMEN AND PELVIS WITHOUT CONTRAST TECHNIQUE: Multidetector CT imaging of the abdomen and  pelvis was performed following the standard protocol without IV contrast. COMPARISON:  08/10/2019 MRI.  CT of 05/13/2019 FINDINGS: Lower chest: Clear lung bases. Normal heart size without pericardial or pleural effusion. Hepatobiliary: Limited evaluation for focal liver lesion. Common duct stent in place, with pneumobilia suggesting stent patency. Gallstones. Mild gallbladder distension with pericholecystic edema, including on 34/3. Pancreas: The pancreatic primary is poorly evaluated on this noncontrast exam. No gross pancreatic duct dilatation. No convincing evidence of pancreatitis. Spleen: Normal in size, without focal abnormality. Adrenals/Urinary Tract: Normal adrenal glands. No renal calculi or hydronephrosis. 2.0 cm interpolar left renal low-density lesion is likely a cyst. Mild right renal cortical thinning. No renal calculi or hydronephrosis. No bladder calculi. Stomach/Bowel: Normal stomach, without wall thickening. Scattered colonic diverticula. Normal terminal ileum and appendix. Normal small bowel. Vascular/Lymphatic: No gross arterial encasement. No abdominopelvic adenopathy. An 11 mm upper normal portal caval node is similar to 12 mm on the prior. 21/3 today. Reproductive: Normal prostate. Other: No significant free fluid. No evidence of omental or peritoneal disease. Musculoskeletal: Prior trauma or surgery involving right iliac crest. Mild wedging of the T12 vertebral body, chronic. IMPRESSION: 1. Findings highly suspicious for acute cholecystitis. Gallbladder distension and pericholecystic edema in the setting of gallstones. Depending on clinical concern, ultrasound could confirm. 2. Suboptimal evaluation of the pancreatic primary secondary to noncontrast technique. Common duct stent in place with pneumobilia. 3.  Aortic Atherosclerosis (ICD10-I70.0). Electronically Signed   By: Abigail Miyamoto M.D.   On: 09/23/2019 11:05   CT ABDOMEN PELVIS W CONTRAST  Result Date: 09/30/2019 CLINICAL DATA:   Abdominal distension for 4 days. Pancreas cancer with recent ERCP EXAM: CT ABDOMEN AND PELVIS WITH CONTRAST TECHNIQUE: Multidetector CT imaging of the abdomen and pelvis was performed using the standard protocol following bolus administration of intravenous contrast. CONTRAST:  173mL OMNIPAQUE IOHEXOL 300 MG/ML  SOLN COMPARISON:  Seven days ago FINDINGS: Lower chest:  Trace pleural effusions.  No acute finding Hepatobiliary: Pneumobilia in the setting of pancreatic stent, which is patent and diffusely air-filled. No liver mass is seen. Liver scalloping described below.Cholelithiasis and gallbladder distension with mild thickening and regional fat stranding, also seen previously. Pancreas: Hypodensity in loss of architecture in the midline body correlating with the history. No evidence of acute pancreatitis Spleen: Enlarged in the setting of portal venous narrowing. Adrenals/Urinary Tract: Negative adrenals. Small kidneys. No hydronephrosis. Bilateral renal cystic densities. Unremarkable bladder. Stomach/Bowel:  No obstruction. No evidence of bowel inflammation. Vascular/Lymphatic: No acute vascular finding. There is portal confluence narrowing and gastric varices. No mass or adenopathy. Reproductive:No pathologic findings. Other: Interval development of organized peritoneal collection lateral to the liver which measures 20 x 6 cm in contains a few bubbles of gas. The collection is rim enhancing. There is new ascites in the gutters and moderate peritoneal collection in the pelvis with detectable wall thickening. These have occurred rapidly, more consistent with inflammatory collections than a manifestation of peritoneal tumor spread. Musculoskeletal:  Degenerative changes without acute finding. 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 IMPRESSION: 1. 20 x 6 x 15 cm peritoneal or subcapsular organized collection along the right liver with a few bubbles of gas. The collection has developed rapidly and is  likely an abscess or biloma. 2. Moderate ascites in the lower abdomen with smooth peritoneal thickening in the pelvis, new from prior and attributed to peritonitis rather than malignancy. 3. Recently replaced biliary stent which appears patent with diffuse gas filling. 4. Cholelithiasis. Improved but persistent inflammatory changes at the gallbladder wall. 5. 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 Electronically Signed   By: Monte Fantasia M.D.   On: 09/30/2019 06:19   DG CHEST PORT 1 VIEW  Result Date: 09/26/2019 CLINICAL DATA:  Shortness of breath EXAM: PORTABLE CHEST 1 VIEW COMPARISON:  09/23/2019 FINDINGS: Right Port-A-Cath remains in place, unchanged. Bibasilar airspace opacities, favor atelectasis. Small bilateral effusions suspected. Heart is normal size. No edema. No acute bony abnormality. IMPRESSION: Small bilateral pleural effusions with bibasilar opacities, likely atelectasis. Electronically Signed   By: Rolm Baptise M.D.   On: 09/26/2019 18:28   DG Chest Portable 1 View  Result Date: 09/23/2019 CLINICAL DATA:  Cough.  History of pancreatic cancer EXAM: PORTABLE CHEST 1 VIEW COMPARISON:  08/08/2019 FINDINGS: The heart size and mediastinal contours are within normal limits. Both lungs are clear. The visualized skeletal structures are unremarkable. Port-A-Cath tip in the lower SVC unchanged from the prior study. IMPRESSION: No active disease. Electronically Signed   By: Franchot Gallo M.D.   On: 09/23/2019 12:38   DG ERCP BILIARY & PANCREATIC DUCTS  Result Date: 09/24/2019 CLINICAL DATA:  Pancreas cancer, biliary obstruction occluded stent EXAM: ERCP balloon sweep and stent exchange TECHNIQUE: Multiple spot images obtained with the fluoroscopic device and submitted for interpretation post-procedure. FLUOROSCOPY TIME:  Fluoroscopy Time:  2 minutes 29 seconds COMPARISON:  09/23/2019 CT FINDINGS: Limited ERCP demonstrates removal of the covered biliary stent.  Balloon sweep performed. This was followed by placement of a new metallic self expanding biliary stent in a similar position. Final image demonstrates residual pneumobilia with decompression of the biliary tree. IMPRESSION: Limited imaging during ERCP with distal CBD stent exchanged These images were submitted for radiologic interpretation only. Please see the procedural report for the amount of contrast and the fluoroscopy time utilized. Electronically Signed   By: Jerilynn Mages.  Shick M.D.   On: 09/24/2019 11:57   US Abdomen Limited RUQ  Result Date: 09/23/2019 CLINICAL DATA:  Nausea and vomiting. History of pancreatic cancer and biliary stent EXAM: ULTRASOUND ABDOMEN LIMITED RIGHT UPPER QUADRANT COMPARISON:  CT 09/23/2019 FINDINGS: Gallbladder: Gallbladder lumen is completely filled with heterogeneous sludge and small shadowing stones. Irregular wall thickening with associated pericholecystic fluid. No sonographic Murphy sign noted by sonographer. Common bile duct: Diameter: 4 mm.  Pneumobilia noted. Liver: Reverberation artifact within the intrahepatic biliary ducts compatible with pneumobilia. No focal lesion identified. Within normal limits in parenchymal echogenicity. Portal vein is patent on color Doppler imaging with normal direction of blood flow towards the liver. Other: None. IMPRESSION: 1. Abnormal appearance of the gallbladder which is distended by sludge and small stones. Irregular gallbladder wall thickening and pericholecystic edema. Findings are suspicious for acute cholecystitis. 2. Pneumobilia. Electronically Signed   By: Davina Poke D.O.   On: 09/23/2019 11:43    ASSESSMENT AND PLAN: 1.  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  Abdominal pain secondary to peritoneal abscess versus biloma  3.  Leukocytosis 4.  Anemia 5.  Mild thrombocytopenia, improved 6.  Hyperbilirubinemia, improved 7.  Protein calorie malnutrition/weight loss 8.  Alcohol abuse 9.   Hypertension 10.  History of severe trauma disability  -We will continue to hold chemotherapy until he improves. -Agree with evaluation by IR for drainage of fluid collection.  Please send for culture. -Anemia is overall stable.  Monitor closely and transfuse if hemoglobin is less than 8. -Recommend dietitian consult when he is no longer n.p.o. -Monitor liver function closely.   LOS: 0 days   Isaac Bussing, DNP, AGPCNP-BC, AOCNP 09/30/19

## 2019-09-30 NOTE — Progress Notes (Signed)
Pharmacy Antibiotic Note  Isaac Doyle is a 64 y.o. male admitted on 09/30/2019 with intra-abdominal infection.  Pharmacy has been consulted for piperacillin/tazobactam dosing.  Pt has PMH significant for pancreatic cancer. Recent hospitalization for cholangitis requiring bile duct stent placement - was on pip/tazo during admission and discharged on Augmentin on 09/28/19. Readmitted on 8/27 with complaints of abdominal pain.  Today, 09/30/19  WBC 13.3  SCr 0.94, CrCl 77 mL/min  Lactate 1  Plan:  Piperacillin/tazobactam 3.375 g IV once over 30 mins followed by piperacillin/tazobactam 3.375 g IV q8h EI  Pharmacy to sign off as renal function at baseline and WNL.  Height: 5\' 11"  (180.3 cm) Weight: 69.4 kg (153 lb) IBW/kg (Calculated) : 75.3  Temp (24hrs), Avg:97.8 F (36.6 C), Min:97.8 F (36.6 C), Max:97.8 F (36.6 C)  Recent Labs  Lab 09/23/19 0936 09/23/19 1315 09/24/19 0418 09/25/19 0443 09/26/19 0455 09/27/19 0442 09/28/19 0446 09/30/19 0445  WBC  --   --    < > 18.1* 18.2* 13.3* 11.2* 13.3*  CREATININE  --   --    < > 2.10* 2.16* 1.73* 1.44* 0.94  LATICACIDVEN 2.1* 2.0*  --   --   --   --   --  1.0   < > = values in this interval not displayed.    Estimated Creatinine Clearance: 76.9 mL/min (by C-G formula based on SCr of 0.94 mg/dL).    No Known Allergies  Antimicrobials this admission: Piperacillin/tazobactam 8/27 >>   Thank you for allowing pharmacy to be a part of this patient's care.  Lenis Noon, PharmD 09/30/2019 8:48 AM

## 2019-09-30 NOTE — ED Triage Notes (Signed)
Patient brought in by EMS for right sided abdominal pain x4 days after having stent placed in abdomen. Patient has history of pancreatic cancer, given 9mcg of Fentanyl en route to ED.

## 2019-09-30 NOTE — H&P (Signed)
History and Physical    Isaac Doyle YTK:[id number] DOB: 07-Mar-1955 DOA: 09/30/2019  PCP: System, Pcp Not In  Patient coming from: Home  Chief Complaint: Abd pain  HPI: Isaac Doyle is a 64 y.o. male with medical history significant of hypertension, pancreatic cancer post common bile duct stent placement who was recently discharged following admission for severe sepsis with cholangitis, discharged with oral Augmentin on 09/28/2019.  Following discharge, patient reported worsening of upper quadrant abdominal pain with a developing "knot" in the upper abdomen.  Patient denies chest pain shortness of breath  ED Course: In emergency department, patient was noted to have a presenting white blood count of 13.3 thousand.  Patient did undergo a CT abdomen pelvis with findings notable for a 20 x 6 x 15 cm peritoneal versus subcapsular organized collection along the right liver suspicious for abscess versus biloma.  General surgery was initially consulted who had recommended medical admission with IR consult for drain placement.  Hospitalist service consulted for consideration for medical admission  Review of Systems:  Review of Systems  Constitutional: Negative for chills and fever.  HENT: Negative for congestion, ear discharge, ear pain and nosebleeds.   Eyes: Negative for double vision, photophobia and pain.  Respiratory: Negative for sputum production and shortness of breath.   Cardiovascular: Negative for palpitations, orthopnea and claudication.  Gastrointestinal: Positive for abdominal pain. Negative for blood in stool and constipation.  Genitourinary: Negative for frequency, hematuria and urgency.  Musculoskeletal: Negative for back pain, joint pain and neck pain.  Neurological: Negative for tremors, sensory change, seizures and loss of consciousness.  Psychiatric/Behavioral: Negative for hallucinations and memory loss. The patient is not nervous/anxious.     Past Medical History:    Diagnosis Date  . Hypertension     Past Surgical History:  Procedure Laterality Date  . BILIARY STENT PLACEMENT  05/11/2019   Procedure: BILIARY STENT PLACEMENT;  Surgeon: Irene Shipper, MD;  Location: [facility] ENDOSCOPY;  Service: Endoscopy;;  . BILIARY STENT PLACEMENT  06/03/2019   Procedure: BILIARY STENT PLACEMENT;  Surgeon: Jackquline Denmark, MD;  Location: [facility] ENDOSCOPY;  Service: Endoscopy;;  . BILIARY STENT PLACEMENT N/A 09/24/2019   Procedure: BILIARY STENT PLACEMENT;  Surgeon: Ladene Artist, MD;  Location: [facility] ENDOSCOPY;  Service: Endoscopy;  Laterality: N/A;  . ERCP N/A 05/11/2019   Procedure: ENDOSCOPIC RETROGRADE CHOLANGIOPANCREATOGRAPHY (ERCP);  Surgeon: Irene Shipper, MD;  Location: [facility] ENDOSCOPY;  Service: Endoscopy;  Laterality: N/A;  with   . ERCP N/A 06/03/2019   Procedure: ENDOSCOPIC RETROGRADE CHOLANGIOPANCREATOGRAPHY (ERCP);  Surgeon: Jackquline Denmark, MD;  Location: [facility] ENDOSCOPY;  Service: Endoscopy;  Laterality: N/A;  . ERCP N/A 09/24/2019   Procedure: ENDOSCOPIC RETROGRADE CHOLANGIOPANCREATOGRAPHY (ERCP);  Surgeon: Ladene Artist, MD;  Location: [facility] ENDOSCOPY;  Service: Endoscopy;  Laterality: N/A;  . ESOPHAGOGASTRODUODENOSCOPY (EGD) WITH PROPOFOL N/A 05/19/2019   Procedure: ESOPHAGOGASTRODUODENOSCOPY (EGD) WITH PROPOFOL;  Surgeon: [name], MD;  Location: [facility] ENDOSCOPY;  Service: Endoscopy;  Laterality: N/A;  . EUS N/A 05/19/2019   Procedure: UPPER ENDOSCOPIC ULTRASOUND (EUS) LINEAR;  Surgeon: [name], MD;  Location: [facility] ENDOSCOPY;  Service: Endoscopy;  Laterality: N/A;  . FINE NEEDLE ASPIRATION N/A 05/19/2019   Procedure: FINE NEEDLE ASPIRATION (FNA) LINEAR;  Surgeon: [name], MD;  Location: [facility] ENDOSCOPY;  Service: Endoscopy;  Laterality: N/A;  . IR IMAGING GUIDED PORT INSERTION  06/08/2019  . REMOVAL OF STONES  09/24/2019   Procedure: REMOVAL OF STONES;  Surgeon: Ladene Artist, MD;  Location: [facility] ENDOSCOPY;  Service: Endoscopy;;  . SPHINCTEROTOMY  05/11/2019    Procedure: SPHINCTEROTOMY;  Surgeon: Irene Shipper, MD;  Location: [facility] ENDOSCOPY;  Service: Endoscopy;;  . [name] REMOVAL  06/03/2019   Procedure: STENT REMOVAL;  Surgeon: Jackquline Denmark, MD;  Location: [facility] ENDOSCOPY;  Service: Endoscopy;;  . STENT REMOVAL  09/24/2019   Procedure: STENT REMOVAL;  Surgeon: Ladene Artist, MD;  Location: [facility] ENDOSCOPY;  Service: Endoscopy;;     reports that he has never smoked. He has never used smokeless tobacco. He reports current alcohol use of about 22.0 standard drinks of alcohol per week. He reports previous drug use.  No Known Allergies  Family History  Problem Relation Age of Onset  . Lymphoma Mother 6       cancer in lymph nodes, unsure of primary  . Heart attack Father   . Lupus Sister     Prior to Admission medications   Medication Sig Start Date End Date Taking? Authorizing Provider  amoxicillin-clavulanate (AUGMENTIN) 875-125 MG tablet Take 1 tablet by mouth 2 (two) times daily for 10 days. 09/28/19 10/08/19  Rai, Vernelle Emerald, MD  atenolol (TENORMIN) 100 MG tablet Take 1 tablet (100 mg total) by mouth daily. Hold until follow-up with your PCP 09/28/19   Rai, Vernelle Emerald, MD  cetirizine (ZYRTEC) 10 MG tablet Take 10 mg by mouth daily.    [provider]  diphenoxylate-atropine (LOMOTIL) 2.5-0.025 MG tablet Take 1-2 tablets by mouth 4 (four) times daily as needed for diarrhea or loose stools. Patient taking differently: Take 2 tablets by mouth 4 (four) times daily as needed for diarrhea or loose stools.  07/14/19   Truitt Merle, MD  guaifenesin (HUMIBID E) 400 MG TABS tablet Take 1 tablet (400 mg total) by mouth every 6 (six) hours as needed. Patient taking differently: Take 400 mg by mouth in the morning and at bedtime.  05/13/19   Geradine Girt, DO  hydrocortisone (ANUSOL-HC) 2.5 % rectal cream Apply 1 application topically 4 (four) times daily as needed for hemorrhoids. 09/28/19   Rai, Vernelle Emerald, MD  lidocaine-prilocaine (EMLA) cream Apply 1  application topically as needed. Patient taking differently: Apply 1 application topically as needed (To access the port).  06/23/19   Truitt Merle, MD  loperamide (IMODIUM) 2 MG capsule Take 1-2 capsules (2-4 mg total) by mouth every 6 (six) hours as needed for diarrhea or loose stools. 07/14/19   Truitt Merle, MD  magnesium oxide (MAG-OX) 400 (241.3 Mg) MG tablet Take 1 tablet (400 mg total) by mouth daily. Patient taking differently: Take 400 mg by mouth every other day.  05/14/19   Geradine Girt, DO  methocarbamol (ROBAXIN) 500 MG tablet Take 1 tablet (500 mg total) by mouth every 6 (six) hours as needed for muscle spasms. 09/28/19   Rai, Ripudeep Raliegh Ip, MD  ondansetron (ZOFRAN ODT) 4 MG disintegrating tablet Take 1 tablet (4 mg total) by mouth every 8 (eight) hours as needed for nausea or vomiting. 09/28/19   Rai, Ripudeep K, MD  pantoprazole (PROTONIX) 40 MG tablet Take 1 tablet (40 mg total) by mouth daily. 09/28/19 01/26/20  Rai, Ripudeep K, MD  potassium chloride SA (KLOR-CON) 20 MEQ tablet Take 2 tablets (40 mEq total) by mouth daily. 08/30/19   Truitt Merle, MD  traMADol (ULTRAM) 50 MG tablet Take 1 tablet (50 mg total) by mouth every 8 (eight) hours as needed for moderate pain or severe pain. 09/28/19 09/27/20  Mendel Corning, MD  Physical Exam: Vitals:   09/30/19 0615 09/30/19 0630 09/30/19 0700 09/30/19 0722  BP: 129/76 128/74 123/72 123/72  Pulse:    88  Resp: 15 15 16 17   Temp:      TempSrc:      SpO2: 100% 100% 100% 100%  Weight:      Height:        Constitutional: NAD, calm, comfortable Vitals:   09/30/19 0615 09/30/19 0630 09/30/19 0700 09/30/19 0722  BP: 129/76 128/74 123/72 123/72  Pulse:    88  Resp: 15 15 16 17   Temp:      TempSrc:      SpO2: 100% 100% 100% 100%  Weight:      Height:       Eyes: PERRL, lids and conjunctivae normal ENMT: Mucous membranes are moist. Posterior pharynx clear of any exudate or lesions.Normal dentition.  Neck: normal, supple, no masses, no  thyromegaly Respiratory: clear to auscultation bilaterally, no wheezing, no crackles. Normal respiratory effort. No accessory muscle use.  Cardiovascular: Regular rate and rhythm, 1, S2 Abdomen: Mildly tender, distended,  Bowel sounds positive.  Musculoskeletal: no clubbing / cyanosis. No joint deformity upper and lower extremities. Good ROM, no contractures. Normal muscle tone.  Skin: no rashes, lesions Neurologic: CN 2-12 grossly intact. Sensation intact. Strength 5/5 in all 4.  Psychiatric: Normal judgment and insight. Alert and oriented x 3. Normal mood.    Labs on Admission: I have personally reviewed following labs and imaging studies  CBC: Recent Labs  Lab 09/23/19 0930 09/24/19 0418 09/25/19 0443 09/25/19 0443 09/26/19 0455 09/26/19 1201 09/27/19 0442 09/28/19 0446 09/30/19 0445  WBC 58.2*   < > 18.1*  --  18.2*  --  13.3* 11.2* 13.3*  NEUTROABS 52.2*  --   --   --   --   --   --   --  10.8*  HGB 8.6*   < > 7.4*   < > 8.2* 8.4* 8.3* 8.0* 8.3*  HCT 26.3*   < > 22.4*   < > 25.3* 26.0* 25.7* 25.3* 26.3*  MCV 82.4   < > 81.8  --  82.4  --  82.1 83.0 83.2  PLT 172   < > 108*  --  108*  --  87* 86* 137*   < > = values in this interval not displayed.   Basic Metabolic Panel: Recent Labs  Lab 09/24/19 0418 09/24/19 0418 09/25/19 0443 09/26/19 0455 09/27/19 0442 09/28/19 0446 09/30/19 0445  NA 136   < > 136 137 134* 139 140  K 4.2   < > 4.3 3.9 3.7 3.4* 3.0*  CL 108   < > 108 108 105 107 104  CO2 16*   < > 17* 18* 18* 20* 22  GLUCOSE 105*   < > 201* 134* 109* 117* 132*  BUN 25*   < > 38* 47* 42* 35* 22  CREATININE 1.87*   < > 2.10* 2.16* 1.73* 1.44* 0.94  CALCIUM 7.2*   < > 7.3* 7.6* 7.7* 8.1* 8.5*  MG 1.0*  --  1.9 2.1 1.9  --   --   PHOS 3.4  --  3.8 3.6 3.9  --   --    < > = values in this interval not displayed.   GFR: Estimated Creatinine Clearance: 76.9 mL/min (by C-G formula based on SCr of 0.94 mg/dL). Liver Function Tests: Recent Labs  Lab  09/24/19 0418 09/25/19 0443 09/26/19 0455 09/27/19 0442 09/30/19 0445  AST  25 23 22 23 20   ALT 19 20 20 19 15   ALKPHOS 180* 112 101 104 117  BILITOT 5.8* 4.2* 2.2* 2.5* 2.1*  PROT 5.6* 5.4* 5.4* 5.5* 6.1*  ALBUMIN 2.1* 2.4* 2.1* 2.0* 2.4*   Recent Labs  Lab 09/23/19 0930  LIPASE 20   No results for input(s): AMMONIA in the last 168 hours. Coagulation Profile: Recent Labs  Lab 09/23/19 0930 09/24/19 0418  INR 1.4* 1.7*   Cardiac Enzymes: No results for input(s): CKTOTAL, CKMB, CKMBINDEX, TROPONINI in the last 168 hours. BNP (last 3 results) No results for input(s): PROBNP in the last 8760 hours. HbA1C: No results for input(s): HGBA1C in the last 72 hours. CBG: No results for input(s): GLUCAP in the last 168 hours. Lipid Profile: No results for input(s): CHOL, HDL, LDLCALC, TRIG, CHOLHDL, LDLDIRECT in the last 72 hours. Thyroid Function Tests: No results for input(s): TSH, T4TOTAL, FREET4, T3FREE, THYROIDAB in the last 72 hours. Anemia Panel: No results for input(s): VITAMINB12, FOLATE, FERRITIN, TIBC, IRON, RETICCTPCT in the last 72 hours. Urine analysis:    Component Value Date/Time   COLORURINE AMBER (A) 09/23/2019 0848   APPEARANCEUR CLOUDY (A) 09/23/2019 0848   LABSPEC 1.023 09/23/2019 0848   PHURINE 5.0 09/23/2019 0848   GLUCOSEU NEGATIVE 09/23/2019 0848   HGBUR SMALL (A) 09/23/2019 0848   BILIRUBINUR NEGATIVE 09/23/2019 0848   KETONESUR NEGATIVE 09/23/2019 0848   PROTEINUR 100 (A) 09/23/2019 0848   NITRITE NEGATIVE 09/23/2019 0848   LEUKOCYTESUR NEGATIVE 09/23/2019 0848   Sepsis Labs: !!!!!!!!!!!!!!!!!!!!!!!!!!!!!!!!!!!!!!!!!!!! @LABRCNTIP (procalcitonin:4,lacticidven:4) ) Recent Results (from the past 240 hour(s))  Urine culture     Status: None   Collection Time: 09/23/19  8:49 AM   Specimen: Urine, Random  Result Value Ref Range Status   Specimen Description   Final    URINE, RANDOM Performed at [facility], [place] [address], [place], [place] [address]    Special Requests   Final    NONE Performed at [facility], [place] [address] [place], [place] [address]    Culture   Final    NO GROWTH Performed at [facility], [place] [address], [place], [place] [address]    Report Status 09/25/2019 FINAL  Final  SARS Coronavirus 2 by RT PCR (hospital order, performed in [facility] hospital lab) Nasopharyngeal Nasopharyngeal Swab     Status: None   Collection Time: 09/23/19  8:50 AM   Specimen: Nasopharyngeal Swab  Result Value Ref Range Status   SARS Coronavirus 2 NEGATIVE NEGATIVE Final    Comment: (NOTE) SARS-CoV-2 target nucleic acids are NOT DETECTED.  The SARS-CoV-2 RNA is generally detectable in upper and lower respiratory specimens during the acute phase of infection. The lowest concentration of SARS-CoV-2 viral copies this assay can detect is 250 copies / mL. A negative result does not preclude SARS-CoV-2 infection and should not be used as the sole basis for treatment or other patient management decisions.  A negative result may occur with improper specimen collection / handling, submission of specimen other than nasopharyngeal swab, presence of viral mutation(s) within the areas targeted by this assay, and inadequate number of viral copies (<250 copies / mL). A negative result must be combined with clinical observations, patient history, and epidemiological information.  Fact Sheet for Patients:   StrictlyIdeas.no  Fact Sheet for Healthcare Providers: BankingDealers.co.za  This test is not yet approved or  cleared by the [place] FDA and has been authorized for detection and/or diagnosis  of SARS-CoV-2 by FDA under an Emergency Use Authorization (EUA).  This EUA will remain in effect (meaning this test can be used) for the duration of the COVID-19 declaration under Section 564(b)(1) of the Act, 21 U.S.C. section  360bbb-3(b)(1), unless the authorization is terminated or revoked sooner.  Performed at [facility], [place] [address] [place], [place] [address]   Blood culture (routine x 2)     Status: Abnormal   Collection Time: 09/23/19  9:30 AM   Specimen: BLOOD  Result Value Ref Range Status   Specimen Description   Final    BLOOD SITE NOT SPECIFIED Performed at [place] [address] [place], [place] [address]    Special Requests   Final    BOTTLES DRAWN AEROBIC AND ANAEROBIC Blood Culture results may not be optimal due to an inadequate volume of blood received in culture bottles Performed at [place] [address], [place], [place] [address]    Culture  Setup Time   Final    ANAEROBIC BOTTLE ONLY GRAM POSITIVE COCCI CRITICAL RESULT CALLED TO, READ BACK BY AND VERIFIED WITH: Ellin Mayhew HICKS [id number] FCP    Culture (A)  Final    ANAEROCOCCUS PREVOTII Standardized susceptibility testing for this organism is not available. Performed at [facility], [place] [address], [place], [place] [address]    Report Status 09/29/2019 FINAL  Final  Blood Culture ID Panel (Reflexed)     Status: None   Collection Time: 09/23/19  9:30 AM  Result Value Ref Range Status   Enterococcus faecalis NOT DETECTED NOT DETECTED Final   Enterococcus Faecium NOT DETECTED NOT DETECTED Final   Listeria monocytogenes NOT DETECTED NOT DETECTED Final   Staphylococcus species NOT DETECTED NOT DETECTED Final   Staphylococcus aureus (BCID) NOT DETECTED NOT DETECTED Final   Staphylococcus epidermidis NOT DETECTED NOT DETECTED Final   Staphylococcus lugdunensis NOT DETECTED NOT DETECTED Final   Streptococcus species NOT DETECTED NOT DETECTED Final   Streptococcus agalactiae NOT DETECTED NOT DETECTED Final   Streptococcus pneumoniae NOT DETECTED NOT DETECTED Final   Streptococcus pyogenes NOT DETECTED NOT DETECTED Final   A.calcoaceticus-baumannii NOT  DETECTED NOT DETECTED Final   Bacteroides fragilis NOT DETECTED NOT DETECTED Final   Enterobacterales NOT DETECTED NOT DETECTED Final   Enterobacter cloacae complex NOT DETECTED NOT DETECTED Final   Escherichia coli NOT DETECTED NOT DETECTED Final   Klebsiella aerogenes NOT DETECTED NOT DETECTED Final   Klebsiella oxytoca NOT DETECTED NOT DETECTED Final   Klebsiella pneumoniae NOT DETECTED NOT DETECTED Final   Proteus species NOT DETECTED NOT DETECTED Final   Salmonella species NOT DETECTED NOT DETECTED Final   Serratia marcescens NOT DETECTED NOT DETECTED Final   Haemophilus influenzae NOT DETECTED NOT DETECTED Final   Neisseria meningitidis NOT DETECTED NOT DETECTED Final   Pseudomonas aeruginosa NOT DETECTED NOT DETECTED Final   Stenotrophomonas maltophilia NOT DETECTED NOT DETECTED Final   Candida albicans NOT DETECTED NOT DETECTED Final   Candida auris NOT DETECTED NOT DETECTED Final   Candida glabrata NOT DETECTED NOT DETECTED Final   Candida krusei NOT DETECTED NOT DETECTED Final   Candida parapsilosis NOT DETECTED NOT DETECTED Final   Candida tropicalis NOT DETECTED NOT DETECTED Final   Cryptococcus neoformans/gattii NOT DETECTED NOT DETECTED Final    Comment: Performed at [facility], [address], [place], [place] [address]     Radiological Exams on Admission: [place]  Result Date: 09/30/2019 CLINICAL DATA:  Abdominal distension for 4 days. Pancreas cancer with recent ERCP EXAM: CT ABDOMEN AND PELVIS WITH CONTRAST TECHNIQUE: Multidetector CT imaging of the abdomen and pelvis was performed using the standard protocol following bolus administration of intravenous contrast. CONTRAST:  125mL OMNIPAQUE IOHEXOL 300 MG/ML  SOLN COMPARISON:  Seven days ago FINDINGS: Lower chest:  Trace pleural effusions.  No acute finding Hepatobiliary: Pneumobilia in the setting of pancreatic stent, which is patent and diffusely air-filled. No liver mass is seen. Liver  scalloping described below.Cholelithiasis and gallbladder distension with mild thickening and regional fat stranding, also seen previously. Pancreas: Hypodensity in loss of architecture in the midline body correlating with the history. No evidence of acute pancreatitis Spleen: Enlarged in the setting of portal venous narrowing. Adrenals/Urinary Tract: Negative adrenals. Small kidneys. No hydronephrosis. Bilateral renal cystic densities. Unremarkable bladder. Stomach/Bowel:  No obstruction. No evidence of bowel inflammation. Vascular/Lymphatic: No acute vascular finding. There is portal confluence narrowing and gastric varices. No mass or adenopathy. Reproductive:No pathologic findings. Other: Interval development of organized peritoneal collection lateral to the liver which measures 20 x 6 cm in contains a few bubbles of gas. The collection is rim enhancing. There is new ascites in the gutters and moderate peritoneal collection in the pelvis with detectable wall thickening. These have occurred rapidly, more consistent with inflammatory collections than a manifestation of peritoneal tumor spread. Musculoskeletal: Degenerative changes without acute finding. 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 IMPRESSION: 1. 20 x 6 x 15 cm peritoneal or subcapsular organized collection along the right liver with a few bubbles of gas. The collection has developed rapidly and is likely an abscess or biloma. 2. Moderate ascites in the lower abdomen with smooth peritoneal thickening in the pelvis, new from prior and attributed to peritonitis rather than malignancy. 3. Recently replaced biliary stent which appears patent with diffuse gas filling. 4. Cholelithiasis. Improved but persistent inflammatory changes at the gallbladder wall. 5. 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 Electronically Signed   By: Monte Fantasia M.D.   On: 09/30/2019 06:19    EKG: Independently reviewed. 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 Abdominal pain secondary to peritoneal abscess versus biloma without sepsis 1. Patient was recently discharged on 09/28/2019 for severe sepsis secondary to acute cholangitis. On that visit patient underwent ERCP with biliary sludge removal with biliary stent exchange. 2. Patient was discharged on Augmentin 3. On this presentation, CT scan personally reviewed, findings notable for a 20 x 6 x 15 cm peritoneal or subcapsular organized collection along the right liver. Findings suggestive of abscess versus biloma 4. EDP did discuss case with general surgery who recommended medical admission with IR consult for drain placement 5. We will consult interventional radiology. We will keep n.p.o. for now 6. Repeat CBC in the morning 7. We will continue patient on empiric Zosyn 2. Hypertension 1. Blood pressure stable at present would continue home regimen as tolerated 3. Pancreatic cancer 1. Followed by oncology, sees Dr. Burr Medico 4. History of CBD obstruction status post stent placement 1. Labs reviewed, LFTs appear stable at this time 2. Would repeat LFTs in the morning  DVT prophylaxis: Lovenox Code Status: DNR Family Communication: Pt in room  Disposition  Plan:   Consults called:  Admission status: Patient is will likely require greater than 2 midnight stay for stent placement as well as IV antibiotics for large fluid collection  Marylu Lund MD [facility] Pager On Amion  If 7PM-7AM, please contact night-coverage  09/30/2019, 7:44 AM

## 2019-10-01 DIAGNOSIS — R1011 Right upper quadrant pain: Secondary | ICD-10-CM

## 2019-10-01 LAB — COMPREHENSIVE METABOLIC PANEL
ALT: 13 U/L (ref 0–44)
AST: 18 U/L (ref 15–41)
Albumin: 1.9 g/dL — ABNORMAL LOW (ref 3.5–5.0)
Alkaline Phosphatase: 97 U/L (ref 38–126)
Anion gap: 10 (ref 5–15)
BUN: 17 mg/dL (ref 8–23)
CO2: 22 mmol/L (ref 22–32)
Calcium: 8 mg/dL — ABNORMAL LOW (ref 8.9–10.3)
Chloride: 104 mmol/L (ref 98–111)
Creatinine, Ser: 0.92 mg/dL (ref 0.61–1.24)
GFR calc Af Amer: >60 mL/min (ref >60)
GFR calc non Af Amer: >60 mL/min (ref >60)
Glucose, Bld: 107 mg/dL — ABNORMAL HIGH (ref 70–99)
Potassium: 3.3 mmol/L — ABNORMAL LOW (ref 3.5–5.1)
Sodium: 136 mmol/L (ref 135–145)
Total Bilirubin: 1.6 mg/dL — ABNORMAL HIGH (ref 0.3–1.2)
Total Protein: 5.3 g/dL — ABNORMAL LOW (ref 6.5–8.1)

## 2019-10-01 LAB — CBC
HCT: 27.4 % — ABNORMAL LOW (ref 39.0–52.0)
Hemoglobin: 8.4 g/dL — ABNORMAL LOW (ref 13.0–17.0)
MCH: 25.9 pg — ABNORMAL LOW (ref 26.0–34.0)
MCHC: 30.7 g/dL (ref 30.0–36.0)
MCV: 84.6 fL (ref 80.0–100.0)
Platelets: 172 10*3/uL (ref 150–400)
RBC: 3.24 MIL/uL — ABNORMAL LOW (ref 4.22–5.81)
RDW: 17.4 % — ABNORMAL HIGH (ref 11.5–15.5)
WBC: 16.6 10*3/uL — ABNORMAL HIGH (ref 4.0–10.5)
nRBC: 0 % (ref 0.0–0.2)

## 2019-10-01 MED ORDER — BOOST / RESOURCE BREEZE PO LIQD CUSTOM
1.0000 | Freq: Three times a day (TID) | ORAL | Status: DC
  Administered 2019-10-01 – 2019-10-04 (×10): 1 via ORAL

## 2019-10-01 MED ORDER — ALBUTEROL SULFATE (2.5 MG/3ML) 0.083% IN NEBU
3.0000 mL | INHALATION_SOLUTION | Freq: Four times a day (QID) | RESPIRATORY_TRACT | Status: DC
  Administered 2019-10-01 (×2): 3 mL via RESPIRATORY_TRACT
  Filled 2019-10-01 (×2): qty 3

## 2019-10-01 MED ORDER — PROSOURCE PLUS PO LIQD
30.0000 mL | Freq: Two times a day (BID) | ORAL | Status: DC
  Administered 2019-10-02 – 2019-10-06 (×9): 30 mL via ORAL
  Filled 2019-10-01 (×9): qty 30

## 2019-10-01 MED ORDER — SODIUM CHLORIDE 0.9% FLUSH: 10.0000 mL | INTRAVENOUS | Status: DC | PRN

## 2019-10-01 MED ORDER — SODIUM CHLORIDE 0.9% FLUSH
10.0000 mL | Freq: Two times a day (BID) | INTRAVENOUS | Status: DC
  Administered 2019-10-01 – 2019-10-06 (×5): 10 mL

## 2019-10-01 MED ORDER — ALBUTEROL SULFATE (2.5 MG/3ML) 0.083% IN NEBU
3.0000 mL | INHALATION_SOLUTION | Freq: Two times a day (BID) | RESPIRATORY_TRACT | Status: DC
  Administered 2019-10-02 – 2019-10-04 (×6): 3 mL via RESPIRATORY_TRACT
  Filled 2019-10-01 (×6): qty 3

## 2019-10-01 NOTE — Evaluation (Signed)
Occupational Therapy Evaluation Patient Details Name: Isaac Doyle MRN: [id number] DOB: [birth year] Today's Date: 10/01/2019    History of Present Illness As per HPI: 64 y.o. male with medical history significant of hypertension, pancreatic cancer post common bile duct stent placement who was recently discharged following admission for severe sepsis with cholangitis, Readmitted with abdominal pain and S/p drain placement by IR right upper quadrant, felt to be blioma   Clinical Impression   [name] is a 64 year old man with medical history listed above s/p IR RUQ drain placement who presents with generalized weakness, decreased activity tolerance, impaired balance and complaints of pain resulting in impaired ability to perform baseline ADLs and independent mobility. With MMT patient demonstrated functional strength but overall generalized weakness with functional tasks. Patient required min assist for standing and transfer to recliner exhibiting fatigue and heavy breathing with minimal activity. Patient mod assist for LB ADLs and set up assist for UB ADLs. With donning socks at side of bed patient began to fall to the side with weight shifting secondary to fatigue. Patient will benefit from skilled OT services to improve deficits in order to improve functional abilities. Patient reports he will be discharging to his daughter's house and will have her assistance and that "they" delivered a RW yesterday to his house. Therapist recommended short term rehab at discharge to patient but patient not overly agreeable. If patient refuses SNF would recommend 24/7 assistance at home and DME listed below.    Follow Up Recommendations  SNF;Home health OT;Supervision/Assistance - 24 hour    Equipment Recommendations  3 in 1 bedside commode    Recommendations for Other Services       Precautions / Restrictions Precautions Precautions: Fall Precaution Comments: R side drain Restrictions Weight  Bearing Restrictions: No      Mobility Bed Mobility Overal bed mobility: Needs Assistance Bed Mobility: Supine to Sit     Supine to sit: HOB elevated;Min guard     General bed mobility comments: Increased time to transfer to side of bed. verbal cues to watch for lines./drains.  Transfers Overall transfer level: Needs assistance Equipment used: 1 person hand held assist Transfers: Sit to/from [facility] Sit to Stand: Min assist Stand pivot transfers: Min assist       General transfer comment: MIn assist for steadying to stand and transfer to recliner. Patient holding therapist hand and using other hand on bed rail and then chair arm. Patient unsteady. Fatigued quickly with heavy breathing.    Balance Overall balance assessment: Needs assistance Sitting-balance support: No upper extremity supported;Feet supported Sitting balance-Leahy Scale: Fair Sitting balance - Comments: With fatigue began to fall over on side of bed - but initially did well.   Standing balance support: Bilateral upper extremity supported Standing balance-Leahy Scale: Poor                             ADL either performed or assessed with clinical judgement   ADL Overall ADL's : Needs assistance/impaired Eating/Feeding: Set up;Sitting   Grooming: Set up;Sitting;Wash/dry face;Oral care Grooming Details (indicate cue type and reason): oral care seated in recliner Upper Body Bathing: Set up;Sitting   Lower Body Bathing: Sit to/from stand;Cueing for safety;Minimal assistance   Upper Body Dressing : Set up;Sitting   Lower Body Dressing: Set up;Supervision/safety;Min guard;Moderate assistance Lower Body Dressing Details (indicate cue type and reason): MIn assist to donn socks at side of bed using figure  4 position. Fatigued quickly with task and beginning to fall over onto bed. Would need assistance to pull clothing up and maintain standing position. Toilet Transfer: Minimal  assistance;Stand-pivot;BSC;Cueing for safety   Toileting- Clothing Manipulation and Hygiene: Moderate assistance;Sit to/from stand;Cueing for safety;Cueing for sequencing       Functional mobility during ADLs: Minimal assistance       Vision Patient Visual Report: No change from baseline       Perception     Praxis      Pertinent Vitals/Pain Pain Assessment: 0-10 Pain Score: 8  Pain Location: R abdomen at drain site Pain Descriptors / Indicators: Discomfort;Sore;Grimacing Pain Intervention(s): Limited activity within patient's tolerance;Monitored during session;Premedicated before session     Hand Dominance Right   Extremity/Trunk Assessment Upper Extremity Assessment Upper Extremity Assessment: Overall WFL for tasks assessed (Good strength with MMT)   Lower Extremity Assessment Lower Extremity Assessment: Defer to PT evaluation   Cervical / Trunk Assessment Cervical / Trunk Assessment: Kyphotic   Communication Communication Communication: No difficulties   Cognition Arousal/Alertness: Awake/alert Behavior During Therapy: WFL for tasks assessed/performed Overall Cognitive Status: Within Functional Limits for tasks assessed                                     General Comments       Exercises     Shoulder Instructions      Home Living Family/patient expects to be discharged to:: Private residence Living Arrangements: Alone Available Help at Discharge: Family;Available PRN/intermittently Type of Home: Apartment Home Access: Level entry     Home Layout: One level     Bathroom Shower/Tub: Teacher, early years/pre: Standard     Home Equipment: Cane - single point;Walker - 2 wheels   Additional Comments: WHen he discharges has plans to go home to his daughter's house. 2 steps in the front. No steps at the back door. tub/shower. RW accessible sized bathroom.      Prior Functioning/Environment Level of Independence:  Independent with assistive device(s)        Comments: neighbors/family assist with meals and other household tasks as needed        OT Problem List: Impaired balance (sitting and/or standing);Decreased activity tolerance;Decreased knowledge of use of DME or AE;Decreased strength      OT Treatment/Interventions: Self-care/ADL training;Patient/family education;DME and/or AE instruction;Therapeutic activities;Therapeutic exercise    OT Goals(Current goals can be found in the care plan section) Acute Rehab OT Goals Patient Stated Goal: to get back to [facility] OT Goal Formulation: With patient Time For Goal Achievement: 10/15/19 Potential to Achieve Goals: Good  OT Frequency: Min 2X/week   Barriers to D/C:            Co-evaluation              AM-PAC OT "6 Clicks" Daily Activity     Outcome Measure Help from another person eating meals?: A Little Help from another person taking care of personal grooming?: A Little Help from another person toileting, which includes using toliet, bedpan, or urinal?: A Lot Help from another person bathing (including washing, rinsing, drying)?: A Lot Help from another person to put on and taking off regular upper body clothing?: A Little Help from another person to put on and taking off regular lower body clothing?: A Lot 6 Click Score: 15   End of Session Nurse Communication: Mobility status  Activity Tolerance:  Patient limited by fatigue Patient left: in chair;with call bell/phone within reach;with chair alarm set  OT Visit Diagnosis: Unsteadiness on feet (R26.81);Muscle weakness (generalized) (M62.81);Repeated falls (R29.6)                Time: 8599-2341 OT Time Calculation (min): 27 min Charges:  OT General Charges $OT Visit: 1 Visit OT Evaluation $OT Eval Moderate Complexity: 1 Mod OT Treatments $Self Care/Home Management : 8-22 mins  Zlatan Hornback, OTR/L [place] [phone] Pager: [place] 10/01/2019, 1:02 PM

## 2019-10-01 NOTE — Evaluation (Signed)
Physical Therapy Evaluation Patient Details Name: Isaac Doyle MRN: [id number] DOB: Jul 03, 1955 Today's Date: 10/01/2019   History of Present Illness  As per HPI: 64 y.o. male with medical history significant of hypertension, pancreatic cancer post common bile duct stent placement who was recently discharged following admission for severe sepsis with cholangitis, Readmitted with abdominal pain and S/p drain placement by IR right upper quadrant, felt to be blioma  Clinical Impression  Pt admitted with above diagnosis. Pt currently with functional limitations due to the deficits listed below (see PT Problem List). Pt will benefit from skilled PT to increase their independence and safety with mobility to allow discharge to the venue listed below.  Pt fatigued with gait and shaky at the end requiring MIN A with RW.  Recommend SNF, but if he refuses, then recommend home with daughter and 24/7 A with 3-1 BSC and possibly a hospital bed.     Follow Up Recommendations SNF;Supervision/Assistance - 24 hour;Home health PT    Equipment Recommendations  3in1 (PT);Hospital bed (if refuses SNF and goes to daughter, recommend 3-1, daughter asking about a hospital bed)    Recommendations for Other Services       Precautions / Restrictions Precautions Precautions: Fall Precaution Comments: R side drain Restrictions Weight Bearing Restrictions: No      Mobility  Bed Mobility Overal bed mobility: Needs Assistance Bed Mobility: Sit to Supine     Supine to sit: HOB elevated;Min guard Sit to supine: Supervision   General bed mobility comments: Increased time, but able to get legs up on bed himself. Cues for technique.  Transfers Overall transfer level: Needs assistance Equipment used: Rolling walker (2 wheeled) Transfers: Sit to/from Stand Sit to Stand: Min assist Stand pivot transfers: Min assist       General transfer comment: MIN A to power up from  recliner.  Ambulation/Gait Ambulation/Gait assistance: Min guard;Min assist Gait Distance (Feet): 40 Feet (x2) Assistive device: Rolling walker (2 wheeled) Gait Pattern/deviations: Step-through pattern;Decreased step length - right;Decreased step length - left;Trunk flexed     General Gait Details: MIN/guard initially and MIN A with turn at break halfway.  Towards end of gait, he began to have noted weakness in his legs with shakiness.  Stairs            Wheelchair Mobility    Modified Rankin (Stroke Patients Only)       Balance Overall balance assessment: Needs assistance Sitting-balance support: No upper extremity supported;Feet supported Sitting balance-Leahy Scale: Fair Sitting balance - Comments: With fatigue began to fall over on side of bed - but initially did well.   Standing balance support: Bilateral upper extremity supported Standing balance-Leahy Scale: Poor Standing balance comment: requires UE support                             Pertinent Vitals/Pain Pain Assessment: Faces Faces Pain Scale: Hurts little more Pain Location: R abdomen at drain site Pain Descriptors / Indicators: Grimacing Pain Intervention(s): Limited activity within patient's tolerance;Monitored during session;Repositioned    Home Living Family/patient expects to be discharged to:: Private residence Living Arrangements: Alone Available Help at Discharge: Family;Available PRN/intermittently Type of Home: Apartment Home Access: Level entry     Home Layout: One level Home Equipment: Cane - single point;Walker - 2 wheels Additional Comments: If he doesn't go to a SNF he would he discharge to his daughter's house. 2 steps in the front. No steps at the  back door. tub/shower. RW accessible sized bathroom. 24 hour S is not guaranteed at this time.    Prior Function Level of Independence: Independent with assistive device(s)         Comments: AMb with RW     Hand  Dominance   Dominant Hand: Right    Extremity/Trunk Assessment   Upper Extremity Assessment Upper Extremity Assessment: Defer to OT evaluation    Lower Extremity Assessment Lower Extremity Assessment: Generalized weakness    Cervical / Trunk Assessment Cervical / Trunk Assessment: Kyphotic  Communication   Communication: No difficulties  Cognition Arousal/Alertness: Awake/alert Behavior During Therapy: WFL for tasks assessed/performed Overall Cognitive Status: Within Functional Limits for tasks assessed                                        General Comments      Exercises     Assessment/Plan    PT Assessment Patient needs continued PT services  PT Problem List Decreased strength;Decreased mobility;Decreased activity tolerance;Decreased balance;Decreased knowledge of use of DME       PT Treatment Interventions DME instruction;Gait training;Functional mobility training;Therapeutic activities;Therapeutic exercise;Balance training;Stair training    PT Goals (Current goals can be found in the Care Plan section)  Acute Rehab PT Goals Patient Stated Goal: Daughter wants pt to get stronger PT Goal Formulation: With family Time For Goal Achievement: 10/15/19 Potential to Achieve Goals: Good    Frequency Min 3X/week   Barriers to discharge Decreased caregiver support      Co-evaluation               AM-PAC PT "6 Clicks" Mobility  Outcome Measure Help needed turning from your back to your side while in a flat bed without using bedrails?: A Little Help needed moving from lying on your back to sitting on the side of a flat bed without using bedrails?: A Little Help needed moving to and from a bed to a chair (including a wheelchair)?: A Little Help needed standing up from a chair using your arms (e.g., wheelchair or bedside chair)?: A Little Help needed to walk in hospital room?: A Little Help needed climbing 3-5 steps with a railing? : A Lot 6  Click Score: 17    End of Session Equipment Utilized During Treatment: Gait belt Activity Tolerance: Patient tolerated treatment well Patient left: in bed;with call bell/phone within reach;with bed alarm set;with family/visitor present   PT Visit Diagnosis: Muscle weakness (generalized) (M62.81);Difficulty in walking, not elsewhere classified (R26.2);Unsteadiness on feet (R26.81)    Time: 4801-6553 PT Time Calculation (min) (ACUTE ONLY): 27 min   Charges:   PT Evaluation $PT Eval Moderate Complexity: 1 Mod PT Treatments $Gait Training: 8-22 mins        Isaac Doyle, [place] Pager [phone] 10/01/2019   Isaac Doyle 10/01/2019, 4:15 PM

## 2019-10-01 NOTE — Evaluation (Signed)
Clinical/Bedside Swallow Evaluation Patient Details  Name: Isaac Doyle MRN: [id number] Date of Birth: [birth year]  Today's Date: 10/01/2019 Time: SLP Start Time (ACUTE ONLY): 7017 SLP Stop Time (ACUTE ONLY): 1735 SLP Time Calculation (min) (ACUTE ONLY): 25 min  Past Medical History:  Past Medical History:  Diagnosis Date  . Hypertension    Past Surgical History:  Past Surgical History:  Procedure Laterality Date  . BILIARY STENT PLACEMENT  05/11/2019   Procedure: BILIARY STENT PLACEMENT;  Surgeon: Irene Shipper, MD;  Location: [facility] ENDOSCOPY;  Service: Endoscopy;;  . BILIARY STENT PLACEMENT  06/03/2019   Procedure: BILIARY STENT PLACEMENT;  Surgeon: Jackquline Denmark, MD;  Location: [facility] ENDOSCOPY;  Service: Endoscopy;;  . BILIARY STENT PLACEMENT N/A 09/24/2019   Procedure: BILIARY STENT PLACEMENT;  Surgeon: Ladene Artist, MD;  Location: [facility] ENDOSCOPY;  Service: Endoscopy;  Laterality: N/A;  . ERCP N/A 05/11/2019   Procedure: ENDOSCOPIC RETROGRADE CHOLANGIOPANCREATOGRAPHY (ERCP);  Surgeon: Irene Shipper, MD;  Location: [facility] ENDOSCOPY;  Service: Endoscopy;  Laterality: N/A;  with   . ERCP N/A 06/03/2019   Procedure: ENDOSCOPIC RETROGRADE CHOLANGIOPANCREATOGRAPHY (ERCP);  Surgeon: Jackquline Denmark, MD;  Location: [facility] ENDOSCOPY;  Service: Endoscopy;  Laterality: N/A;  . ERCP N/A 09/24/2019   Procedure: ENDOSCOPIC RETROGRADE CHOLANGIOPANCREATOGRAPHY (ERCP);  Surgeon: Ladene Artist, MD;  Location: [facility] ENDOSCOPY;  Service: Endoscopy;  Laterality: N/A;  . ESOPHAGOGASTRODUODENOSCOPY (EGD) WITH PROPOFOL N/A 05/19/2019   Procedure: ESOPHAGOGASTRODUODENOSCOPY (EGD) WITH PROPOFOL;  Surgeon: [name], MD;  Location: [facility] ENDOSCOPY;  Service: Endoscopy;  Laterality: N/A;  . EUS N/A 05/19/2019   Procedure: UPPER ENDOSCOPIC ULTRASOUND (EUS) LINEAR;  Surgeon: [name], MD;  Location: [facility] ENDOSCOPY;  Service: Endoscopy;  Laterality: N/A;  . FINE NEEDLE ASPIRATION N/A 05/19/2019   Procedure: FINE NEEDLE  ASPIRATION (FNA) LINEAR;  Surgeon: [name], MD;  Location: [facility] ENDOSCOPY;  Service: Endoscopy;  Laterality: N/A;  . IR IMAGING GUIDED PORT INSERTION  06/08/2019  . REMOVAL OF STONES  09/24/2019   Procedure: REMOVAL OF STONES;  Surgeon: Ladene Artist, MD;  Location: [facility] ENDOSCOPY;  Service: Endoscopy;;  . [name]  05/11/2019   Procedure: SPHINCTEROTOMY;  Surgeon: Irene Shipper, MD;  Location: [facility] ENDOSCOPY;  Service: Endoscopy;;  . [name] REMOVAL  06/03/2019   Procedure: STENT REMOVAL;  Surgeon: Jackquline Denmark, MD;  Location: [facility] ENDOSCOPY;  Service: Endoscopy;;  . STENT REMOVAL  09/24/2019   Procedure: STENT REMOVAL;  Surgeon: Ladene Artist, MD;  Location: [facility] ENDOSCOPY;  Service: Endoscopy;;   HPI:  64 y.o. male with medical history significant of hypertension, pancreatic cancer post common bile duct stent placement who was recently discharged following admission for severe sepsis with cholangitis, Readmitted with abdominal pain and S/p drain placement by IR right upper quadrant, felt to be blioma   Assessment / Plan / Recommendation Clinical Impression  Based on SLP's observations and patient report, he presents with a moderate pharyngeal-esophageal dysphagia with c/o globus sensation, retrograde movement of liquids, gagging and regurgitation. SLP observed patient to appear with difficulty with swallow of sip of water, delayed swallow initiation and audible swallow with belching. Plan to perform objective swallow study to help in determining what is causing his dysphagia. SLP Visit Diagnosis: Dysphagia, unspecified (R13.10)    Aspiration Risk  Mild aspiration risk    Diet Recommendation Thin liquid;Dysphagia 1 (Puree)   Liquid Administration via: Cup;Straw Medication Administration: Crushed with puree Supervision: Patient able to self feed Compensations: Minimize environmental distractions;Small sips/bites;Slow rate Postural Changes:  Seated upright at 90 degrees;Remain upright  for at least 30 minutes after po intake    Other  Recommendations Oral Care Recommendations: Oral care BID   Follow up Recommendations Other (comment) (TBD)      Frequency and Duration min 2x/week  1 week       Prognosis Prognosis for Safe Diet Advancement: Fair Barriers to Reach Goals: Severity of deficits      Swallow Study   General Date of Onset: 09/30/19 HPI: 64 y.o. male with medical history significant of hypertension, pancreatic cancer post common bile duct stent placement who was recently discharged following admission for severe sepsis with cholangitis, Readmitted with abdominal pain and S/p drain placement by IR right upper quadrant, felt to be blioma Type of Study: Bedside Swallow Evaluation Previous Swallow Assessment: N/A Diet Prior to this Study: Thin liquids;Dysphagia 1 (puree) Temperature Spikes Noted: No Respiratory Status: Room air History of Recent Intubation: No Behavior/Cognition: Alert;Cooperative;Pleasant mood Oral Cavity Assessment: Within Functional Limits Oral Care Completed by SLP: Recent completion by staff Oral Cavity - Dentition: Adequate natural dentition Vision: Functional for self-feeding Self-Feeding Abilities: Able to feed self Patient Positioning: Upright in bed Baseline Vocal Quality: Normal Volitional Cough: Strong Volitional Swallow: Able to elicit    Oral/Motor/Sensory Function Overall Oral Motor/Sensory Function: Within functional limits   Ice Chips     Thin Liquid Thin Liquid: Impaired Presentation: Straw;Self Fed Pharyngeal  Phase Impairments: Suspected delayed Swallow Other Comments: patient grimacing and reporting water getting stuck    Nectar Thick     Honey Thick     Puree     Solid           Sonia Baller, MA, CCC-SLP 10/01/19 6:12 PM

## 2019-10-01 NOTE — Progress Notes (Signed)
Initial Nutrition Assessment  DOCUMENTATION CODES:   Not applicable  INTERVENTION:  Boost Breeze po TID, each supplement provides 250 kcal and 9 grams of protein ProSource Plus 30 ml po BID, each supplement provides 100 kcal and 15 grams of protein Magic cup BID with meals, each supplement provides 290 kcal and 9 grams of protein CIB daily with breakfast, each supplement with 237 ml whole milk provides 280 kcal and 13 grams protein  NUTRITION DIAGNOSIS:   Inadequate oral intake related to decreased appetite as evidenced by per patient/family report, percent weight loss.   GOAL:   Patient will meet greater than or equal to 90% of their needs    MONITOR:   Labs, I & O's, Supplement acceptance, PO intake, Weight trends, Skin  REASON FOR ASSESSMENT:   Malnutrition Screening Tool, Consult Assessment of nutrition requirement/status  ASSESSMENT:  RD working remotely.  64 year old male with past medical history significant of HTN, pancreatic cancer s/p CBD stent who was recently discharged following admission for severe sepsis with cholangitis presented with worsening upper quadrant abdominal pain.  CT abdomen pelvis suspicious for abscess verses biloma, general surgery consulted who recommended admission with IR consult and is  s/p RUQ drain placement on 8/27.  Able to speak with pt via phone, says he's not doing too good today and reports poor appetite at home. He endorses new chewing/swallowing difficulties, diet downgraded to DYS 1 with thin liquids on 8/27, reports tolerating puree diet well and recalls applesauce and sherbet for lunch today. Patient does not tolerate Ensure supplements, says they give him diarrhea but likes clear supplements. Patient agreeable to trying CIB with breakfast, if tolerated RD encouraged pt to drink at home. Will order Boost Breeze TID, Magic Cup on lunch and dinner trays, and CIB with whole milk on breakfast tray.  Per chart, weights have trended  down 10 lbs (6.2%) in the last 3 weeks and ~22 lbs (12.5%) over the past 2 months; significant. Given significant history of pancreatic cancer, highly suspect severe malnutrition however unable to identify without NFPE, will plan to perform at follow-up.  I/Os: -1082 ml since admit    Drains: -1015 ml x 24 hrs UOP: 725 ml x 24 hrs  Medications reviewed and include: Tenormin, Protonix IVPB: Zosyn Labs: K 3.3 (L), WBC 16.6 (H), Hgb 8.4 (L), HCT 27.4 (L)  NUTRITION - FOCUSED PHYSICAL EXAM: Unable to perform at this time, RD working remotely.  Diet Order:   Diet Order            DIET - DYS 1 Room service appropriate? Yes; Fluid consistency: Thin  Diet effective now                 EDUCATION NEEDS:   Education needs have been addressed  Skin:  Skin Assessment: Reviewed RN Assessment  Last BM:  pta  Height:   Ht Readings from Last 1 Encounters:  09/30/19 5\' 11"  (1.803 m)    Weight:   Wt Readings from Last 1 Encounters:  09/30/19 69.4 kg    Ideal Body Weight:  78.2 kg  BMI:  Body mass index is 21.34 kg/m.  Estimated Nutritional Needs:   Kcal:  2220-2430  Protein:  111-122  Fluid:  >/= 2.2 L   Lajuan Lines, RD, LDN Clinical Nutrition After Hours/Weekend Pager # in [place]

## 2019-10-01 NOTE — Progress Notes (Signed)
PROGRESS NOTE    Isaac Doyle  SPQ:[id number] DOB: 05/15/1955 DOA: 09/30/2019 PCP: System, Pcp Not In   Chief Complaint  Patient presents with  . Abdominal Pain   Brief Narrative: As per HPI: 64 y.o. male with medical history significant of hypertension, pancreatic cancer post common bile duct stent placement who was recently discharged following admission for severe sepsis with cholangitis, discharged with oral Augmentin on 09/28/2019, admitted 8/27  With worsening of upper quadrant abdominal pain with a developing "knot" in the upper abdomen, in ED WBC 13.3 K,CT abdomen pelvis with findings notable for a 20 x 6 x 15 cm peritoneal versus subcapsular organized collection along the right liver suspicious for abscess versus biloma.  General surgery was initially consulted who had recommended IR consultation.  Patient was admitted under hospitalist for further  Management. 8/27: Image guided right upper quadrant drain placement, FELT TO BE BLIOMA.  Subjective: Shortness of breath sometime On RA. No chest pain nausea vomiting or diarrhea. Mid abd pain + Drain + with cloudy output Afebrile overnight, noted bump with WBC 16.6. Saturating on room air Drain output 1015 mL DYSPHAGIA overnight- wants pureed. slp eval pending  Assessment & Plan:  Right upper quadrant abdominal pain CT shows 20 X6X 15 cm peritoneal versus subcapsular allograft collection suspicious for abscess versus biloma. S/p drain placement by IR right upper quadrant, felt to be blioma. Having significant drain output.  WBC up overnight.  But afebrile.  Blood pressure stable.  Continue on empiric Zosyn,follow-up DRAIN culture and monitor output, continue drain care as per IR.  Mild hypokalemia will be replaced.  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  CBD obstruction history with  recent stent placed, monitor LFTs, with slightly elevated T bili which is downtrending from previous value.  Essential hypertension: Blood pressure is controlled, continue home atenolol.  Swallowing issues overnight, speech consulted  Protein calorie malnutrition moderate with weight loss in the setting of cancer, consulted dietitian.  Augment diet.  Recent admission 8/2--8/25 for severe sepsis/acute cholangitis possible acute cholecystitis, AKI, metabolic acidosis dyspnea  DVT prophylaxis: enoxaparin (LOVENOX) injection 40 mg Start: 09/30/19 0845 Code Status:   Code Status: DNR  Family Communication: plan of care discussed with patient at bedside.  Status is: Inpatient  Remains inpatient appropriate because:IV treatments appropriate due to intensity of illness or inability to take PO and Inpatient level of care appropriate due to severity of illness  Dispo: The patient is from: Home with daughter              Anticipated d/c is to: Home              Anticipated d/c date is: 3 days              Patient currently is not medically stable to d/c.  Nutrition: Diet Order            DIET - DYS 1 Room service appropriate? Yes; Fluid consistency: Thin  Diet effective now               Body mass index is 21.34 kg/m.  Consultants:see note  Procedures:see note Microbiology:see note Blood Culture    Component Value Date/Time   SDES  09/30/2019 1226    ABDOMEN ABSCESS Performed at [facility], [place] [address], [place], [place] [address]    SPECREQUEST  09/30/2019 1226  NONE Performed at [facility], [place] [address], [place], [place] [address]    CULT PENDING 09/30/2019 1226   REPTSTATUS PENDING 09/30/2019 1226    Other culture-see note  Medications: Scheduled Meds: . atenolol  100 mg Oral Daily  . Chlorhexidine Gluconate Cloth  6 each Topical Daily  . enoxaparin (LOVENOX) injection  40 mg Subcutaneous Q24H  . pantoprazole  40 mg  Oral Daily  . potassium chloride  40 mEq Oral BID  . sodium chloride flush  10-40 mL Intracatheter Q12H   Continuous Infusions: . sodium chloride 75 mL/hr at 09/30/19 1800  . piperacillin-tazobactam (ZOSYN)  IV 3.375 g (10/01/19 0543)    Antimicrobials: Anti-infectives (From admission, onward)   Start     Dose/Rate Route Frequency Ordered Stop   09/30/19 1500  piperacillin-tazobactam (ZOSYN) IVPB 3.375 g        3.375 g 12.5 mL/hr over 240 Minutes Intravenous Every 8 hours 09/30/19 0848     09/30/19 0845  piperacillin-tazobactam (ZOSYN) IVPB 3.375 g        3.375 g 100 mL/hr over 30 Minutes Intravenous  Once 09/30/19 0843 09/30/19 1225       Objective: Vitals: Today's Vitals   10/01/19 0205 10/01/19 0538 10/01/19 0600 10/01/19 0800  BP: 113/82  133/79   Pulse: 67  68   Resp: 17  15   Temp: (!) 97.3 F (36.3 C)  97.6 F (36.4 C)   TempSrc: Oral  Oral   SpO2: 100%  100%   Weight:      Height:      PainSc:  6   0-No pain    Intake/Output Summary (Last 24 hours) at 10/01/2019 0937 Last data filed at 10/01/2019 0827 Gross per 24 hour  Intake 657.5 ml  Output 1810 ml  Net -1152.5 ml   Filed Weights   09/30/19 0431  Weight: 69.4 kg   Weight change:    Intake/Output from previous day: 08/27 0701 - 08/28 0700 In: 657.5 [I.V.:657.5] Out: 1740 [Urine:725; Drains:1015] Intake/Output this shift: Total I/O In: -  Out: 70 [Drains:70]  Examination:  General exam: AAOx3, weak, frail,NAD HEENT:Oral mucosa moist, Ear/Nose WNL grossly,dentition normal. Respiratory system: bilaterally mild wheezing,no use of accessory muscle, non tender. Cardiovascular system: S1 & S2 +, regular, No JVD. Gastrointestinal system: Abdomen soft, mild mid abdomen Tenderness,ND,BS+. RUQ Drain+ Nervous System:Alert, awake, moving extremities and grossly nonfocal Extremities: No edema, distal peripheral pulses palpable.  Skin: No rashes,no icterus. MSK: Normal muscle bulk,tone, power  Data  Reviewed: I have personally reviewed following labs and imaging studies CBC: Recent Labs  Lab 09/27/19 0442 09/28/19 0446 09/30/19 0445 09/30/19 1328 10/01/19 0709  WBC 13.3* 11.2* 13.3* 10.3 16.6*  NEUTROABS  --   --  10.8*  --   --   HGB 8.3* 8.0* 8.3* 7.6* 8.4*  HCT 25.7* 25.3* 26.3* 24.1* 27.4*  MCV 82.1 83.0 83.2 84.0 84.6  PLT 87* 86* 137* 135* 536   Basic Metabolic Panel: Recent Labs  Lab 09/25/19 0443 09/25/19 0443 09/26/19 0455 09/26/19 0455 09/27/19 0442 09/28/19 0446 09/30/19 0445 09/30/19 1328 10/01/19 0709  NA 136   < > 137  --  134* 139 140  --  136  K 4.3   < > 3.9  --  3.7 3.4* 3.0*  --  3.3*  CL 108   < > 108  --  105 107 104  --  104  CO2 17*   < > 18*  --  18*  20* 22  --  22  GLUCOSE 201*   < > 134*  --  109* 117* 132*  --  107*  BUN 38*   < > 47*  --  42* 35* 22  --  17  CREATININE 2.10*   < > 2.16*   < > 1.73* 1.44* 0.94 0.92 0.92  CALCIUM 7.3*   < > 7.6*  --  7.7* 8.1* 8.5*  --  8.0*  MG 1.9  --  2.1  --  1.9  --   --   --   --   PHOS 3.8  --  3.6  --  3.9  --   --   --   --    < > = values in this interval not displayed.   GFR: Estimated Creatinine Clearance: 78.6 mL/min (by C-G formula based on SCr of 0.92 mg/dL). Liver Function Tests: Recent Labs  Lab 09/25/19 0443 09/26/19 0455 09/27/19 0442 09/30/19 0445 10/01/19 0709  AST 23 22 23 20 18   ALT 20 20 19 15 13   ALKPHOS 112 101 104 117 97  BILITOT 4.2* 2.2* 2.5* 2.1* 1.6*  PROT 5.4* 5.4* 5.5* 6.1* 5.3*  ALBUMIN 2.4* 2.1* 2.0* 2.4* 1.9*   No results for input(s): LIPASE, AMYLASE in the last 168 hours. No results for input(s): AMMONIA in the last 168 hours. Coagulation Profile: Recent Labs  Lab 09/30/19 0445  INR 1.6*   Cardiac Enzymes: No results for input(s): CKTOTAL, CKMB, CKMBINDEX, TROPONINI in the last 168 hours. BNP (last 3 results) No results for input(s): PROBNP in the last 8760 hours. HbA1C: No results for input(s): HGBA1C in the last 72 hours. CBG: No results for  input(s): GLUCAP in the last 168 hours. Lipid Profile: No results for input(s): CHOL, HDL, LDLCALC, TRIG, CHOLHDL, LDLDIRECT in the last 72 hours. Thyroid Function Tests: No results for input(s): TSH, T4TOTAL, FREET4, T3FREE, THYROIDAB in the last 72 hours. Anemia Panel: No results for input(s): VITAMINB12, FOLATE, FERRITIN, TIBC, IRON, RETICCTPCT in the last 72 hours. Sepsis Labs: Recent Labs  Lab 09/30/19 0445  LATICACIDVEN 1.0    Recent Results (from the past 240 hour(s))  Urine culture     Status: None   Collection Time: 09/23/19  8:49 AM   Specimen: Urine, Random  Result Value Ref Range Status   Specimen Description   Final    URINE, RANDOM Performed at [facility] [address] [place], [place] [address]    Special Requests   Final    NONE Performed at [facility], [place] [address], [place], [place] [address]    Culture   Final    NO GROWTH Performed at [facility], [place] [address] [place], [place] [address]    Report Status 09/25/2019 FINAL  Final  SARS Coronavirus 2 by RT PCR (hospital order, performed in [facility] hospital lab) Nasopharyngeal Nasopharyngeal Swab     Status: None   Collection Time: 09/23/19  8:50 AM   Specimen: Nasopharyngeal Swab  Result Value Ref Range Status   SARS Coronavirus 2 NEGATIVE NEGATIVE Final    Comment: (NOTE) SARS-CoV-2 target nucleic acids are NOT DETECTED.  The SARS-CoV-2 RNA is generally detectable in upper and lower respiratory specimens during the acute phase of infection. The lowest concentration of SARS-CoV-2 viral copies this assay can detect is 250 copies / mL. A negative result does not preclude SARS-CoV-2 infection and should not be used as the sole basis for treatment or  other patient management decisions.  A negative result may occur with improper specimen collection / handling, submission of specimen other than nasopharyngeal swab, presence of viral mutation(s) within  the areas targeted by this assay, and inadequate number of viral copies (<250 copies / mL). A negative result must be combined with clinical observations, patient history, and epidemiological information.  Fact Sheet for Patients:   StrictlyIdeas.no  Fact Sheet for Healthcare Providers: BankingDealers.co.za  This test is not yet approved or  cleared by the [place] FDA and has been authorized for detection and/or diagnosis of SARS-CoV-2 by FDA under an Emergency Use Authorization (EUA).  This EUA will remain in effect (meaning this test can be used) for the duration of the COVID-19 declaration under Section 564(b)(1) of the Act, 21 U.S.C. section 360bbb-3(b)(1), unless the authorization is terminated or revoked sooner.  Performed at [facility], [place] [address], [place], [place] [address]   Blood culture (routine x 2)     Status: Abnormal   Collection Time: 09/23/19  9:30 AM   Specimen: BLOOD  Result Value Ref Range Status   Specimen Description   Final    BLOOD SITE NOT SPECIFIED Performed at [facility] [address], [place], [place] [address]    Special Requests   Final    BOTTLES DRAWN AEROBIC AND ANAEROBIC Blood Culture results may not be optimal due to an inadequate volume of blood received in culture bottles Performed at [facility] [address] [place], [place] [address]    Culture  Setup Time   Final    ANAEROBIC BOTTLE ONLY GRAM POSITIVE COCCI CRITICAL RESULT CALLED TO, READ BACK BY AND VERIFIED WITH: Ellin Mayhew HICKS [id number] FCP    Culture (A)  Final    ANAEROCOCCUS PREVOTII Standardized susceptibility testing for this organism is not available. Performed at [facility], [place] [address], [place],  [address]    Report Status 09/29/2019 FINAL  Final  Blood Culture ID Panel (Reflexed)     Status: None   Collection Time: 09/23/19   9:30 AM  Result Value Ref Range Status   Enterococcus faecalis NOT DETECTED NOT DETECTED Final   Enterococcus Faecium NOT DETECTED NOT DETECTED Final   Listeria monocytogenes NOT DETECTED NOT DETECTED Final   Staphylococcus species NOT DETECTED NOT DETECTED Final   Staphylococcus aureus (BCID) NOT DETECTED NOT DETECTED Final   Staphylococcus epidermidis NOT DETECTED NOT DETECTED Final   Staphylococcus lugdunensis NOT DETECTED NOT DETECTED Final   Streptococcus species NOT DETECTED NOT DETECTED Final   Streptococcus agalactiae NOT DETECTED NOT DETECTED Final   Streptococcus pneumoniae NOT DETECTED NOT DETECTED Final   Streptococcus pyogenes NOT DETECTED NOT DETECTED Final   A.calcoaceticus-baumannii NOT DETECTED NOT DETECTED Final   Bacteroides fragilis NOT DETECTED NOT DETECTED Final   Enterobacterales NOT DETECTED NOT DETECTED Final   Enterobacter cloacae complex NOT DETECTED NOT DETECTED Final   Escherichia coli NOT DETECTED NOT DETECTED Final   Klebsiella aerogenes NOT DETECTED NOT DETECTED Final   Klebsiella oxytoca NOT DETECTED NOT DETECTED Final   Klebsiella pneumoniae NOT DETECTED NOT DETECTED Final   Proteus species NOT DETECTED NOT DETECTED Final   Salmonella species NOT DETECTED NOT DETECTED Final   Serratia marcescens NOT DETECTED NOT DETECTED Final   Haemophilus influenzae NOT DETECTED NOT DETECTED Final   Neisseria meningitidis NOT DETECTED NOT DETECTED Final   Pseudomonas aeruginosa NOT DETECTED NOT DETECTED Final   Stenotrophomonas maltophilia NOT DETECTED  NOT DETECTED Final   Candida albicans NOT DETECTED NOT DETECTED Final   Candida auris NOT DETECTED NOT DETECTED Final   Candida glabrata NOT DETECTED NOT DETECTED Final   Candida krusei NOT DETECTED NOT DETECTED Final   Candida parapsilosis NOT DETECTED NOT DETECTED Final   Candida tropicalis NOT DETECTED NOT DETECTED Final   Cryptococcus neoformans/gattii NOT DETECTED NOT DETECTED Final    Comment: Performed at  [facility], [place] [address], [place], [place] [address]  SARS Coronavirus 2 by RT PCR (hospital order, performed in [facility] hospital lab) Nasopharyngeal Nasopharyngeal Swab     Status: None   Collection Time: 09/30/19  7:44 AM   Specimen: Nasopharyngeal Swab  Result Value Ref Range Status   SARS Coronavirus 2 NEGATIVE NEGATIVE Final    Comment: (NOTE) SARS-CoV-2 target nucleic acids are NOT DETECTED.  The SARS-CoV-2 RNA is generally detectable in upper and lower respiratory specimens during the acute phase of infection. The lowest concentration of SARS-CoV-2 viral copies this assay can detect is 250 copies / mL. A negative result does not preclude SARS-CoV-2 infection and should not be used as the sole basis for treatment or other patient management decisions.  A negative result may occur with improper specimen collection / handling, submission of specimen other than nasopharyngeal swab, presence of viral mutation(s) within the areas targeted by this assay, and inadequate number of viral copies (<250 copies / mL). A negative result must be combined with clinical observations, patient history, and epidemiological information.  Fact Sheet for Patients:   StrictlyIdeas.no  Fact Sheet for Healthcare Providers: BankingDealers.co.za  This test is not yet approved or  cleared by the [place] FDA and has been authorized for detection and/or diagnosis of SARS-CoV-2 by FDA under an Emergency Use Authorization (EUA).  This EUA will remain in effect (meaning this test can be used) for the duration of the COVID-19 declaration under Section 564(b)(1) of the Act, 21 U.S.C. section 360bbb-3(b)(1), unless the authorization is terminated or revoked sooner.  Performed at [facility], [place] [address], [place], [place] [address]   Aerobic/Anaerobic Culture (surgical/deep wound)     Status: None (Preliminary result)    Collection Time: 09/30/19 12:26 PM   Specimen: Abdomen; Abscess  Result Value Ref Range Status   Specimen Description   Final    ABDOMEN ABSCESS Performed at [facility] [address], [place], [place] [address]    Special Requests   Final    NONE Performed at [facility], [place] [address] [place], [place] [address]    Gram Stain   Final    ABUNDANT WBC PRESENT,BOTH PMN AND MONONUCLEAR NO ORGANISMS SEEN Performed at [facility], [place] [address] [place], [place] [address]    Culture PENDING  Incomplete   Report Status PENDING  Incomplete      Radiology Studies: CT ABDOMEN PELVIS W CONTRAST  Result Date: 09/30/2019 CLINICAL DATA:  Abdominal distension for 4 days. Pancreas cancer with recent ERCP EXAM: CT ABDOMEN AND PELVIS WITH CONTRAST TECHNIQUE: Multidetector CT imaging of the abdomen and pelvis was performed using the standard protocol following bolus administration of intravenous contrast. CONTRAST:  140mL OMNIPAQUE IOHEXOL 300 MG/ML  SOLN COMPARISON:  Seven days ago FINDINGS: Lower chest:  Trace pleural effusions.  No acute finding Hepatobiliary: Pneumobilia in the setting of pancreatic stent, which is patent and diffusely air-filled. No liver mass is seen. Liver scalloping described below.Cholelithiasis and gallbladder distension with mild  thickening and regional fat stranding, also seen previously. Pancreas: Hypodensity in loss of architecture in the midline body correlating with the history. No evidence of acute pancreatitis Spleen: Enlarged in the setting of portal venous narrowing. Adrenals/Urinary Tract: Negative adrenals. Small kidneys. No hydronephrosis. Bilateral renal cystic densities. Unremarkable bladder. Stomach/Bowel:  No obstruction. No evidence of bowel inflammation. Vascular/Lymphatic: No acute vascular finding. There is portal confluence narrowing and gastric varices. No mass or adenopathy. Reproductive:No pathologic  findings. Other: Interval development of organized peritoneal collection lateral to the liver which measures 20 x 6 cm in contains a few bubbles of gas. The collection is rim enhancing. There is new ascites in the gutters and moderate peritoneal collection in the pelvis with detectable wall thickening. These have occurred rapidly, more consistent with inflammatory collections than a manifestation of peritoneal tumor spread. Musculoskeletal: Degenerative changes without acute finding. 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 IMPRESSION: 1. 20 x 6 x 15 cm peritoneal or subcapsular organized collection along the right liver with a few bubbles of gas. The collection has developed rapidly and is likely an abscess or biloma. 2. Moderate ascites in the lower abdomen with smooth peritoneal thickening in the pelvis, new from prior and attributed to peritonitis rather than malignancy. 3. Recently replaced biliary stent which appears patent with diffuse gas filling. 4. Cholelithiasis. Improved but persistent inflammatory changes at the gallbladder wall. 5. 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 Electronically Signed   By: Monte Fantasia M.D.   On: 09/30/2019 06:19   CT IMAGE GUIDED DRAINAGE BY PERCUTANEOUS CATHETER  Result Date: 09/30/2019 INDICATION: 64 year old male with a history apparent biloma referred for drainage EXAM: CT GUIDED DRAINAGE OF  ABSCESS MEDICATIONS: The patient is currently admitted to the hospital and receiving intravenous antibiotics. The antibiotics were administered within an appropriate time frame prior to the initiation of the procedure. ANESTHESIA/SEDATION: 2.0 mg IV Versed 100 mcg IV Fentanyl Moderate Sedation Time:  15 minutes The patient was continuously monitored during the procedure by the interventional radiology nurse under my direct supervision. COMPLICATIONS: None TECHNIQUE: Informed written consent was obtained from the patient after a  thorough discussion of the procedural risks, benefits and alternatives. All questions were addressed. Maximal Sterile Barrier Technique was utilized including caps, mask, sterile gowns, sterile gloves, sterile drape, hand hygiene and skin antiseptic. A timeout was performed prior to the initiation of the procedure. PROCEDURE: The operative field was prepped with chlorhexidine in a sterile fashion, and a sterile drape was applied covering the operative field. A sterile gown and sterile gloves were used for the procedure. Local anesthesia was provided with 1% Lidocaine. Patient was positioned supine position on the CT gantry table. Scout CT was acquired for planning purposes. Patient is prepped and draped in the usual sterile fashion. 1% lidocaine used for local anesthesia. Using CT guidance, trocar needle was advanced into the fluid collection of the right upper quadrant. Modified Seldinger technique was used to place a 12 French pigtail catheter drain. Approximately 840 cc of yellowish fluid was aspirated. Culture was sent to the lab. Catheter was sutured in position and attached to bulb suction. Final CT was acquired. Patient tolerated the procedure well and remained hemodynamically stable throughout. No complications were encountered and no significant blood loss. FINDINGS: Fluid collection of the right upper quadrant was successfully decompressed with pigtail catheter drainage. Cholelithiasis persist. Stent of the biliary system. IMPRESSION: Status post CT-guided drainage of right upper quadrant fluid collection. Signed, Dulcy Fanny. Earleen Newport, DO, RPVI  [facility] Rad[facility] Electronically Signed   By: Corrie Mckusick D.O.   On: 09/30/2019 13:05     LOS: 1 day   Antonieta Pert, MD [facility]  10/01/2019, 9:37 AM

## 2019-10-02 ENCOUNTER — Inpatient Hospital Stay (HOSPITAL_COMMUNITY): Payer: No Typology Code available for payment source

## 2019-10-02 DIAGNOSIS — I10 Essential (primary) hypertension: Secondary | ICD-10-CM

## 2019-10-02 LAB — COMPREHENSIVE METABOLIC PANEL
ALT: 15 U/L (ref 0–44)
AST: 19 U/L (ref 15–41)
Albumin: 1.8 g/dL — ABNORMAL LOW (ref 3.5–5.0)
Alkaline Phosphatase: 93 U/L (ref 38–126)
Anion gap: 8 (ref 5–15)
BUN: 18 mg/dL (ref 8–23)
CO2: 21 mmol/L — ABNORMAL LOW (ref 22–32)
Calcium: 7.6 mg/dL — ABNORMAL LOW (ref 8.9–10.3)
Chloride: 106 mmol/L (ref 98–111)
Creatinine, Ser: 0.94 mg/dL (ref 0.61–1.24)
GFR calc Af Amer: >60 mL/min (ref >60)
GFR calc non Af Amer: >60 mL/min (ref >60)
Glucose, Bld: 132 mg/dL — ABNORMAL HIGH (ref 70–99)
Potassium: 3.3 mmol/L — ABNORMAL LOW (ref 3.5–5.1)
Sodium: 135 mmol/L (ref 135–145)
Total Bilirubin: 1.5 mg/dL — ABNORMAL HIGH (ref 0.3–1.2)
Total Protein: 5.3 g/dL — ABNORMAL LOW (ref 6.5–8.1)

## 2019-10-02 LAB — CBC
HCT: 25.5 % — ABNORMAL LOW (ref 39.0–52.0)
Hemoglobin: 7.9 g/dL — ABNORMAL LOW (ref 13.0–17.0)
MCH: 25.8 pg — ABNORMAL LOW (ref 26.0–34.0)
MCHC: 31 g/dL (ref 30.0–36.0)
MCV: 83.3 fL (ref 80.0–100.0)
Platelets: 183 10*3/uL (ref 150–400)
RBC: 3.06 MIL/uL — ABNORMAL LOW (ref 4.22–5.81)
RDW: 17.4 % — ABNORMAL HIGH (ref 11.5–15.5)
WBC: 17.3 10*3/uL — ABNORMAL HIGH (ref 4.0–10.5)
nRBC: 0 % (ref 0.0–0.2)

## 2019-10-02 MED ORDER — POTASSIUM CHLORIDE 20 MEQ/15ML (10%) PO SOLN
20.0000 meq | Freq: Once | ORAL | Status: AC
  Administered 2019-10-02: 20 meq via ORAL
  Filled 2019-10-02: qty 15

## 2019-10-02 MED ORDER — OXYCODONE HCL 5 MG PO TABS
5.0000 mg | ORAL_TABLET | Freq: Four times a day (QID) | ORAL | Status: DC | PRN
  Administered 2019-10-02 – 2019-10-05 (×4): 5 mg via ORAL
  Filled 2019-10-02 (×4): qty 1

## 2019-10-02 NOTE — Progress Notes (Signed)
Modified Barium Swallow Progress Note  Patient Details  Name: Isaac Doyle MRN: [id number] Date of Birth: 12-Mar-1955  Today's Date: 10/02/2019  Modified Barium Swallow completed.  Full report located under Chart Review in the Imaging Section.  Brief recommendations include the following:  Clinical Impression  MBS was completed using thin liquids via spoon and cup, nectar thick liquids via cup, pureed material and dual texstured solids.  The patient was able to self feed.  He presented with a mild oropharyngeal dysphagia.   Recommend a dysphagia 2 diet with thin liquids.  He should alternate food/liquids and remain upright for 30-60 minutes after meals to facilitate clearance of esophagus.  ST will follow during acute stay.  GI work up to fully assess esophageal function may be beneficial.  See below for information regarding swallowing physiology.    ORAL Phase    -Decreased lingual control which led to premature loss of the bolus.  This allowed material to fall to the level of the pyriform sinuses prior to the swallow trigger.   -Decreased lingual motion most notable during dual textured solids which led to delayed A/P transpor of the bolus.  -Mild oral residue.    PHARYNGEAL Phase    -Decreased laryngeal elevation.   -Decreased UES opening that appeared to be at least partly mechanical from bulging seen off the spine at the level of the UES.  This led to mild pyriform sinus residue.   -Penetration into the laryngeal vestibule was seen x 1 during the swallow given first teaspoon sip of thin liquids.  It was not replicated.  Cued cough and reswallow was effective to clear material from the laryngeal vestibule.    ESOPHAGEAL Phase    -Sweep revealed it slow to clear with material noted in the cervical esophagus.  Retrograde flow of the bolus was noted.  Liquid wash was not helpful to clear material.     Swallow Evaluation Recommendations   Recommended Consults: Consider GI  evaluation   SLP Diet Recommendations: Dysphagia 2 (Fine chop) solids;Thin liquid   Liquid Administration via: Cup   Medication Administration: Whole meds with liquid   Supervision: Patient able to self feed       Postural Changes: Remain semi-upright after after feeds/meals (Comment);Seated upright at 90 degrees   Oral Care Recommendations: Oral care BID       Shelly Flatten, MA, CCC-SLP Acute Rehab SLP [phone]  Lamar Sprinkles 10/02/2019,2:48 PM

## 2019-10-02 NOTE — Progress Notes (Signed)
PROGRESS NOTE    Isaac Doyle  XIP:[id number] DOB: 12-13-1955 DOA: 09/30/2019 PCP: System, Pcp Not In   Chief Complaint  Patient presents with  . Abdominal Pain   Brief Narrative: As per HPI: 64 y.o. male with medical history significant of hypertension, pancreatic cancer post common bile duct stent placement who was recently discharged following admission for severe sepsis with cholangitis, discharged with oral Augmentin on 09/28/2019, admitted 8/27  With worsening of upper quadrant abdominal pain with a developing "knot" in the upper abdomen, in ED WBC 13.3 K,CT abdomen pelvis with findings notable for a 20 x 6 x 15 cm peritoneal versus subcapsular organized collection along the right liver suspicious for abscess versus biloma.  General surgery was initially consulted who had recommended IR consultation.  Patient was admitted under hospitalist for further  Management. 8/27: Image guided right upper quadrant drain placement, FELT TO BE BLIOMA.  Subjective: Afebrile overnight, saturating well on room air.  WBC increased to 17.3  Potassium 3.3.   Drain output ~255 mL.   C/O pain on rt abdomen at drain site.  Assessment & Plan:  Right upper quadrant abdominal pain CT shows 20 X6X 15 cm peritoneal versus subcapsular allograft collection suspicious for abscess versus biloma.s/p drain placement by IR right upper quadrant, felt to be blioma.Drain culture with abundant WBCs no organisms seen, final culture pending.  WBC uptrending, but patient is afebrile. Vitals are stable.Continue on empiric Zosyn and monitor.  IR managing drain.  We will add oxycodone for pain as pain not controlled by tramadol  Mild hypokalemia: We will replete orally.   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  Moderate pharyngeal-esophageal dysphagia, seen by SLP now on dysphagia/pured diet with thin liquid  Essential hypertension: BP is controlled.Continue home atenolol.   Protein calorie malnutrition moderate with weight loss in the setting of cancer, consulted dietitian.  Augment diet. Has had low appetite and diarrhea and vomtting and has lost weight since being on chemo. SLP,dietitian.  Recent admission 8/2--8/25 for severe sepsis/acute cholangitis possible acute cholecystitis, AKI, metabolic acidosis dyspnea  DVT prophylaxis: enoxaparin (LOVENOX) injection 40 mg Start: 09/30/19 0845 Code Status:   Code Status: DNR  Family Communication: plan of care discussed with patient at bedside. I updated his daughter and is in agreement abt SNF.  Status is: Inpatient  Remains inpatient appropriate because:IV treatments appropriate due to intensity of illness or inability to take PO and Inpatient level of care appropriate due to severity of illness  Dispo: The patient is from: Home with daughter              Anticipated d/c is to: Home PT OT following has advised skilled nursing facility,onsulted TOC              Anticipated d/c date is: 1 day              Patient currently is not medically stable to d/c.  Nutrition: Diet Order            DIET - DYS 1 Room service appropriate? Yes; Fluid consistency: Thin  Diet effective now               Body mass index is 21.34 kg/m.  Consultants:see note  Procedures:see note Microbiology:see note Blood Culture    Component Value Date/Time   SDES  09/30/2019 1226    ABDOMEN ABSCESS Performed at [facility], [place] [address] [place], [place] [address]    SPECREQUEST  09/30/2019 1226    NONE Performed at [facility], [place] [address], [place], [place] [address]    CULT PENDING 09/30/2019 1226   REPTSTATUS PENDING 09/30/2019 1226     Other culture-see note  Medications: Scheduled Meds: . (feeding supplement) PROSource Plus  30 mL Oral BID BM  . albuterol  3 mL Inhalation BID  . atenolol  100 mg Oral Daily  . Chlorhexidine Gluconate Cloth  6 each Topical Daily  . enoxaparin (LOVENOX) injection  40 mg Subcutaneous Q24H  . feeding supplement  1 Container Oral TID BM  . pantoprazole  40 mg Oral Daily  . sodium chloride flush  10-40 mL Intracatheter Q12H   Continuous Infusions: . sodium chloride 75 mL/hr at 10/02/19 0553  . piperacillin-tazobactam (ZOSYN)  IV 3.375 g (10/02/19 0555)    Antimicrobials: Anti-infectives (From admission, onward)   Start     Dose/Rate Route Frequency Ordered Stop   09/30/19 1500  piperacillin-tazobactam (ZOSYN) IVPB 3.375 g        3.375 g 12.5 mL/hr over 240 Minutes Intravenous Every 8 hours 09/30/19 0848     09/30/19 0845  piperacillin-tazobactam (ZOSYN) IVPB 3.375 g        3.375 g 100 mL/hr over 30 Minutes Intravenous  Once 09/30/19 0843 09/30/19 1225       Objective: Vitals: Today's Vitals   10/02/19 0600 10/02/19 0619 10/02/19 0850 10/02/19 0900  BP:  115/74    Pulse:  69    Resp:  14    Temp:  98.2 F (36.8 C)    TempSrc:  Oral    SpO2:  100%  99%  Weight:      Height:      PainSc: Asleep  9      Intake/Output Summary (Last 24 hours) at 10/02/2019 1048 Last data filed at 10/02/2019 0622 Gross per 24 hour  Intake 1217.84 ml  Output 1055 ml  Net 162.84 ml   Filed Weights   09/30/19 0431  Weight: 69.4 kg   Weight change:    Intake/Output from previous day: 08/28 0701 - 08/29 0700 In: 1597.8 [P.O.:120; I.V.:1262.4; IV Piggyback:205.5] Out: 1155 [Urine:900; Drains:255] Intake/Output this shift: No intake/output data recorded.  Examination: General exam:AAOx3,NAD,weak appearing. HEENT:Oral mucosa moist,Ear/Nose WNL grossly,dentition normal. Respiratory system: bilaterally clear,no wheezing or crackles,no use of accessory muscle Cardiovascular system: S1  & S2 +, No JVD. Gastrointestinal system: Abdomen soft, Tender Rt quadrant, drain intact with biliary outoput,ND,BS+ Nervous System:Alert, awake, moving extremities and grossly nonfocal Extremities: No edema, distal peripheral pulses palpable.  Skin: No rashes,no icterus. MSK: Normal muscle bulk,tone, power  Data Reviewed: I have personally reviewed following labs and imaging studies CBC: Recent Labs  Lab 09/28/19 0446 09/30/19 0445 09/30/19 1328 10/01/19 0709 10/02/19 0318  WBC 11.2* 13.3* 10.3 16.6* 17.3*  NEUTROABS  --  10.8*  --   --   --   HGB 8.0* 8.3* 7.6* 8.4* 7.9*  HCT 25.3* 26.3* 24.1* 27.4* 25.5*  MCV 83.0 83.2 84.0 84.6 83.3  PLT 86* 137* 135* 172 948   Basic Metabolic Panel: Recent Labs  Lab 09/26/19 0455 09/26/19 0455 09/27/19 0442 09/27/19 0442 09/28/19 0446 09/30/19 0445 09/30/19 1328 10/01/19 0709 10/02/19 0318  NA 137   < > 134*  --  139 140  --  136 135  K 3.9   < > 3.7  --  3.4* 3.0*  --  3.3* 3.3*  CL 108   < > 105  --  107 104  --  104 106  CO2 18*   < > 18*  --  20* 22  --  22 21*  GLUCOSE 134*   < > 109*  --  117* 132*  --  107* 132*  BUN 47*   < > 42*  --  35* 22  --  17 18  CREATININE 2.16*   < > 1.73*   < > 1.44* 0.94 0.92 0.92 0.94  CALCIUM 7.6*   < > 7.7*  --  8.1* 8.5*  --  8.0* 7.6*  MG 2.1  --  1.9  --   --   --   --   --   --   PHOS 3.6  --  3.9  --   --   --   --   --   --    < > = values in this interval not displayed.   GFR: Estimated Creatinine Clearance: 76.9 mL/min (by C-G formula based on SCr of 0.94 mg/dL). Liver Function Tests: Recent Labs  Lab 09/26/19 0455 09/27/19 0442 09/30/19 0445 10/01/19 0709 10/02/19 0318  AST 22 23 20 18 19   ALT 20 19 15 13 15   ALKPHOS 101 104 117 97 93  BILITOT 2.2* 2.5* 2.1* 1.6* 1.5*  PROT 5.4* 5.5* 6.1* 5.3* 5.3*  ALBUMIN 2.1* 2.0* 2.4* 1.9* 1.8*   No results for input(s): LIPASE, AMYLASE in the last 168 hours. No results for input(s): AMMONIA in the last 168 hours. Coagulation  Profile: Recent Labs  Lab 09/30/19 0445  INR 1.6*   Cardiac Enzymes: No results for input(s): CKTOTAL, CKMB, CKMBINDEX, TROPONINI in the last 168 hours. BNP (last 3 results) No results for input(s): PROBNP in the last 8760 hours. HbA1C: No results for input(s): HGBA1C in the last 72 hours. CBG: No results for input(s): GLUCAP in the last 168 hours. Lipid Profile: No results for input(s): CHOL, HDL, LDLCALC, TRIG, CHOLHDL, LDLDIRECT in the last 72 hours. Thyroid Function Tests: No results for input(s): TSH, T4TOTAL, FREET4, T3FREE, THYROIDAB in the last 72 hours. Anemia Panel: No results for input(s): VITAMINB12, FOLATE, FERRITIN, TIBC, IRON, RETICCTPCT in the last 72 hours. Sepsis Labs: Recent Labs  Lab 09/30/19 0445  LATICACIDVEN 1.0    Recent Results (from the past 240 hour(s))  Urine culture     Status: None   Collection Time: 09/23/19  8:49 AM   Specimen: Urine, Random  Result Value Ref Range Status   Specimen Description   Final    URINE, RANDOM Performed at [facility] [address], [place], [place] [address]    Special Requests   Final    NONE Performed at [facility], [place] [address] [place], [place] [address]    Culture   Final    NO GROWTH Performed at [facility], [place] [address] [place], [place] [address]    Report Status 09/25/2019 FINAL  Final  SARS Coronavirus 2 by RT PCR (hospital order, performed in [facility] hospital lab) Nasopharyngeal Nasopharyngeal Swab     Status: None   Collection Time: 09/23/19  8:50 AM   Specimen: Nasopharyngeal Swab  Result Value Ref Range Status   SARS Coronavirus 2 NEGATIVE NEGATIVE Final    Comment: (NOTE) SARS-CoV-2 target nucleic  acids are NOT DETECTED.  The SARS-CoV-2 RNA is generally detectable in upper and lower respiratory specimens during the acute phase of infection. The lowest concentration of SARS-CoV-2 viral copies this assay can detect is 250 copies /  mL. A negative result does not preclude SARS-CoV-2 infection and should not be used as the sole basis for treatment or other patient management decisions.  A negative result may occur with improper specimen collection / handling, submission of specimen other than nasopharyngeal swab, presence of viral mutation(s) within the areas targeted by this assay, and inadequate number of viral copies (<250 copies / mL). A negative result must be combined with clinical observations, patient history, and epidemiological information.  Fact Sheet for Patients:   StrictlyIdeas.no  Fact Sheet for Healthcare Providers: BankingDealers.co.za  This test is not yet approved or  cleared by the [place] FDA and has been authorized for detection and/or diagnosis of SARS-CoV-2 by FDA under an Emergency Use Authorization (EUA).  This EUA will remain in effect (meaning this test can be used) for the duration of the COVID-19 declaration under Section 564(b)(1) of the Act, 21 U.S.C. section 360bbb-3(b)(1), unless the authorization is terminated or revoked sooner.  Performed at [facility], [name] [address], [place], [place] [address]   Blood culture (routine x 2)     Status: Abnormal   Collection Time: 09/23/19  9:30 AM   Specimen: BLOOD  Result Value Ref Range Status   Specimen Description   Final    BLOOD SITE NOT SPECIFIED Performed at [facility] [address] [place], [place] [address]    Special Requests   Final    BOTTLES DRAWN AEROBIC AND ANAEROBIC Blood Culture results may not be optimal due to an inadequate volume of blood received in culture bottles Performed at [facility] [address], [place], [place] [address]    Culture  Setup Time   Final    ANAEROBIC BOTTLE ONLY GRAM POSITIVE COCCI CRITICAL RESULT CALLED TO, READ BACK BY AND VERIFIED WITH: Ellin Mayhew HICKS [id number] FCP      Culture (A)  Final    ANAEROCOCCUS PREVOTII Standardized susceptibility testing for this organism is not available. Performed at [facility], [place] [address], [place], [place] [address]    Report Status 09/29/2019 FINAL  Final  Blood Culture ID Panel (Reflexed)     Status: None   Collection Time: 09/23/19  9:30 AM  Result Value Ref Range Status   Enterococcus faecalis NOT DETECTED NOT DETECTED Final   Enterococcus Faecium NOT DETECTED NOT DETECTED Final   Listeria monocytogenes NOT DETECTED NOT DETECTED Final   Staphylococcus species NOT DETECTED NOT DETECTED Final   Staphylococcus aureus (BCID) NOT DETECTED NOT DETECTED Final   Staphylococcus epidermidis NOT DETECTED NOT DETECTED Final   Staphylococcus lugdunensis NOT DETECTED NOT DETECTED Final   Streptococcus species NOT DETECTED NOT DETECTED Final   Streptococcus agalactiae NOT DETECTED NOT DETECTED Final   Streptococcus pneumoniae NOT DETECTED NOT DETECTED Final   Streptococcus pyogenes NOT DETECTED NOT DETECTED Final   A.calcoaceticus-baumannii NOT DETECTED NOT DETECTED Final   Bacteroides fragilis NOT DETECTED NOT DETECTED Final   Enterobacterales NOT DETECTED NOT DETECTED Final   Enterobacter cloacae complex NOT DETECTED NOT DETECTED Final   Escherichia coli NOT DETECTED NOT DETECTED Final   Klebsiella aerogenes NOT DETECTED NOT DETECTED Final   Klebsiella oxytoca NOT DETECTED NOT DETECTED Final   Klebsiella pneumoniae NOT DETECTED NOT DETECTED Final  Proteus species NOT DETECTED NOT DETECTED Final   Salmonella species NOT DETECTED NOT DETECTED Final   Serratia marcescens NOT DETECTED NOT DETECTED Final   Haemophilus influenzae NOT DETECTED NOT DETECTED Final   Neisseria meningitidis NOT DETECTED NOT DETECTED Final   Pseudomonas aeruginosa NOT DETECTED NOT DETECTED Final   Stenotrophomonas maltophilia NOT DETECTED NOT DETECTED Final   Candida albicans NOT DETECTED NOT DETECTED Final   Candida auris NOT DETECTED  NOT DETECTED Final   Candida glabrata NOT DETECTED NOT DETECTED Final   Candida krusei NOT DETECTED NOT DETECTED Final   Candida parapsilosis NOT DETECTED NOT DETECTED Final   Candida tropicalis NOT DETECTED NOT DETECTED Final   Cryptococcus neoformans/gattii NOT DETECTED NOT DETECTED Final    Comment: Performed at [facility], [address] [place], [place] [address]  SARS Coronavirus 2 by RT PCR (hospital order, performed in [facility] hospital lab) Nasopharyngeal Nasopharyngeal Swab     Status: None   Collection Time: 09/30/19  7:44 AM   Specimen: Nasopharyngeal Swab  Result Value Ref Range Status   SARS Coronavirus 2 NEGATIVE NEGATIVE Final    Comment: (NOTE) SARS-CoV-2 target nucleic acids are NOT DETECTED.  The SARS-CoV-2 RNA is generally detectable in upper and lower respiratory specimens during the acute phase of infection. The lowest concentration of SARS-CoV-2 viral copies this assay can detect is 250 copies / mL. A negative result does not preclude SARS-CoV-2 infection and should not be used as the sole basis for treatment or other patient management decisions.  A negative result may occur with improper specimen collection / handling, submission of specimen other than nasopharyngeal swab, presence of viral mutation(s) within the areas targeted by this assay, and inadequate number of viral copies (<250 copies / mL). A negative result must be combined with clinical observations, patient history, and epidemiological information.  Fact Sheet for Patients:   StrictlyIdeas.no  Fact Sheet for Healthcare Providers: BankingDealers.co.za  This test is not yet approved or  cleared by the [place] FDA and has been authorized for detection and/or diagnosis of SARS-CoV-2 by FDA under an Emergency Use Authorization (EUA).  This EUA will remain in effect (meaning this test can be used) for the duration of the COVID-19  declaration under Section 564(b)(1) of the Act, 21 U.S.C. section 360bbb-3(b)(1), unless the authorization is terminated or revoked sooner.  Performed at [facility], [place] [address] [place], [place] [address]   Aerobic/Anaerobic Culture (surgical/deep wound)     Status: None (Preliminary result)   Collection Time: 09/30/19 12:26 PM   Specimen: Abdomen; Abscess  Result Value Ref Range Status   Specimen Description   Final    ABDOMEN ABSCESS Performed at [facility] [address], [place], [place] [address]    Special Requests   Final    NONE Performed at [facility], [place] [address] [place], [place] [address]    Gram Stain   Final    ABUNDANT WBC PRESENT,BOTH PMN AND MONONUCLEAR NO ORGANISMS SEEN Performed at [facility], [place] [address], [place], [place] [address]    Culture PENDING  Incomplete   Report Status PENDING  Incomplete      Radiology Studies: CT IMAGE GUIDED DRAINAGE BY PERCUTANEOUS CATHETER  Result Date: 09/30/2019 INDICATION: 64 year old male with a history apparent biloma referred for drainage EXAM: CT GUIDED DRAINAGE OF  ABSCESS MEDICATIONS: The patient is currently admitted to the hospital and receiving intravenous antibiotics. The antibiotics were  administered within an appropriate time frame prior to the initiation of the procedure. ANESTHESIA/SEDATION: 2.0 mg IV Versed 100 mcg IV Fentanyl Moderate Sedation Time:  15 minutes The patient was continuously monitored during the procedure by the interventional radiology nurse under my direct supervision. COMPLICATIONS: None TECHNIQUE: Informed written consent was obtained from the patient after a thorough discussion of the procedural risks, benefits and alternatives. All questions were addressed. Maximal Sterile Barrier Technique was utilized including caps, mask, sterile gowns, sterile gloves, sterile drape, hand hygiene and skin antiseptic. A timeout  was performed prior to the initiation of the procedure. PROCEDURE: The operative field was prepped with chlorhexidine in a sterile fashion, and a sterile drape was applied covering the operative field. A sterile gown and sterile gloves were used for the procedure. Local anesthesia was provided with 1% Lidocaine. Patient was positioned supine position on the CT gantry table. Scout CT was acquired for planning purposes. Patient is prepped and draped in the usual sterile fashion. 1% lidocaine used for local anesthesia. Using CT guidance, trocar needle was advanced into the fluid collection of the right upper quadrant. Modified Seldinger technique was used to place a 12 French pigtail catheter drain. Approximately 840 cc of yellowish fluid was aspirated. Culture was sent to the lab. Catheter was sutured in position and attached to bulb suction. Final CT was acquired. Patient tolerated the procedure well and remained hemodynamically stable throughout. No complications were encountered and no significant blood loss. FINDINGS: Fluid collection of the right upper quadrant was successfully decompressed with pigtail catheter drainage. Cholelithiasis persist. Stent of the biliary system. IMPRESSION: Status post CT-guided drainage of right upper quadrant fluid collection. Signed, Dulcy Fanny. Dellia Nims, RPVI [facility] Radiology Electronically Signed   By: Corrie Mckusick D.O.   On: 09/30/2019 13:05     LOS: 2 days   Antonieta Pert, MD [facility]  10/02/2019, 10:48 AM

## 2019-10-03 ENCOUNTER — Inpatient Hospital Stay: Payer: No Typology Code available for payment source

## 2019-10-03 ENCOUNTER — Inpatient Hospital Stay: Payer: No Typology Code available for payment source | Admitting: Nutrition

## 2019-10-03 ENCOUNTER — Inpatient Hospital Stay: Payer: No Typology Code available for payment source | Admitting: Hematology

## 2019-10-03 DIAGNOSIS — C250 Malignant neoplasm of head of pancreas: Secondary | ICD-10-CM

## 2019-10-03 DIAGNOSIS — R1310 Dysphagia, unspecified: Secondary | ICD-10-CM

## 2019-10-03 DIAGNOSIS — K668 Other specified disorders of peritoneum: Secondary | ICD-10-CM

## 2019-10-03 DIAGNOSIS — R188 Other ascites: Secondary | ICD-10-CM

## 2019-10-03 LAB — COMPREHENSIVE METABOLIC PANEL
ALT: 14 U/L (ref 0–44)
AST: 22 U/L (ref 15–41)
Albumin: 1.7 g/dL — ABNORMAL LOW (ref 3.5–5.0)
Alkaline Phosphatase: 105 U/L (ref 38–126)
Anion gap: 7 (ref 5–15)
BUN: 15 mg/dL (ref 8–23)
CO2: 22 mmol/L (ref 22–32)
Calcium: 7.7 mg/dL — ABNORMAL LOW (ref 8.9–10.3)
Chloride: 107 mmol/L (ref 98–111)
Creatinine, Ser: 0.86 mg/dL (ref 0.61–1.24)
GFR calc Af Amer: >60 mL/min (ref >60)
GFR calc non Af Amer: >60 mL/min (ref >60)
Glucose, Bld: 140 mg/dL — ABNORMAL HIGH (ref 70–99)
Potassium: 3.1 mmol/L — ABNORMAL LOW (ref 3.5–5.1)
Sodium: 136 mmol/L (ref 135–145)
Total Bilirubin: 1.4 mg/dL — ABNORMAL HIGH (ref 0.3–1.2)
Total Protein: 5.2 g/dL — ABNORMAL LOW (ref 6.5–8.1)

## 2019-10-03 LAB — CBC
HCT: 23.5 % — ABNORMAL LOW (ref 39.0–52.0)
Hemoglobin: 7.3 g/dL — ABNORMAL LOW (ref 13.0–17.0)
MCH: 26.3 pg (ref 26.0–34.0)
MCHC: 31.1 g/dL (ref 30.0–36.0)
MCV: 84.5 fL (ref 80.0–100.0)
Platelets: 174 10*3/uL (ref 150–400)
RBC: 2.78 MIL/uL — ABNORMAL LOW (ref 4.22–5.81)
RDW: 17.2 % — ABNORMAL HIGH (ref 11.5–15.5)
WBC: 12.5 10*3/uL — ABNORMAL HIGH (ref 4.0–10.5)
nRBC: 0 % (ref 0.0–0.2)

## 2019-10-03 LAB — PREPARE RBC (CROSSMATCH)

## 2019-10-03 MED ORDER — NYSTATIN 100000 UNIT/ML MT SUSP
5.0000 mL | Freq: Four times a day (QID) | OROMUCOSAL | Status: DC
  Administered 2019-10-03 – 2019-10-06 (×11): 500000 [IU] via ORAL
  Filled 2019-10-03 (×11): qty 5

## 2019-10-03 MED ORDER — SODIUM CHLORIDE 0.9 % IV SOLN
INTRAVENOUS | Status: DC | PRN
  Administered 2019-10-03 – 2019-10-05 (×2): 250 mL via INTRAVENOUS

## 2019-10-03 MED ORDER — POTASSIUM CHLORIDE 10 MEQ/100ML IV SOLN
10.0000 meq | INTRAVENOUS | Status: AC
  Administered 2019-10-03 (×2): 10 meq via INTRAVENOUS
  Filled 2019-10-03 (×2): qty 100

## 2019-10-03 MED ORDER — FERROUS SULFATE 325 (65 FE) MG PO TABS
325.0000 mg | ORAL_TABLET | Freq: Two times a day (BID) | ORAL | Status: DC
  Administered 2019-10-03 – 2019-10-06 (×6): 325 mg via ORAL
  Filled 2019-10-03 (×6): qty 1

## 2019-10-03 MED ORDER — SODIUM CHLORIDE 0.9% IV SOLUTION: Freq: Once | INTRAVENOUS | Status: DC

## 2019-10-03 MED ORDER — POTASSIUM CHLORIDE CRYS ER 20 MEQ PO TBCR
40.0000 meq | EXTENDED_RELEASE_TABLET | Freq: Once | ORAL | Status: AC
  Administered 2019-10-03: 08:00:00 40 meq via ORAL
  Filled 2019-10-03: qty 2

## 2019-10-03 NOTE — [place] FL2 (Addendum)
[place] MEDICAID FL2 LEVEL OF CARE SCREENING TOOL     IDENTIFICATION  Patient Name: Isaac Doyle Birthdate: 1955/04/29 Sex: male Admission Date (Current Location): 09/30/2019  [facility] and [place] Number:  Herbalist and Address:  [facility],  [place] [address] [place]      Provider Number: [id number]  Attending Physician Name and Address:  Antonieta Pert, MD  Relative Name and Phone Number:  Milagros Evener Daughter [phone]    Current Level of Care: Hospital Recommended Level of Care: [place] Prior Approval Number:    Date Approved/Denied:   PASRR Number:   [id number]  Discharge Plan: SNF    Current Diagnoses: Patient Active Problem List   Diagnosis Date Noted  . Abdominal pain 09/30/2019  . Elevated LFTs   . RUQ abdominal pain   . Severe sepsis with acute organ dysfunction ([place]) 09/23/2019  . 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 Anemia of chronic disease 09/23/2019  . Cholangitis 09/23/2019  . Acute cholecystitis   . Port-A-Cath in place 08/17/2019  . Symptomatic anemia 08/08/2019  . Malignant obstructive jaundice ([place]) 06/03/2019  . Malignant neoplasm of pancreas ([place])   . ARF (acute renal failure) ([place]) 05/10/2019  . Essential hypertension 05/10/2019  . Hypokalemia 05/10/2019  . Normochromic normocytic anemia 05/10/2019  . Malignant biliary obstruction ([place])   . Jaundice 05/09/2019    Orientation RESPIRATION BLADDER Height & Weight     Self, Time, Situation, Place  Normal Continent Weight: 153 lb (69.4 kg) Height:  5\' 11"  (180.3 cm)  BEHAVIORAL SYMPTOMS/MOOD NEUROLOGICAL BOWEL NUTRITION STATUS      Continent  dysphagia 1 pured diet with thin liquid.   AMBULATORY STATUS COMMUNICATION OF NEEDS Skin   Extensive Assist Verbally Normal, Other (Comment) (Closed System Drain 1 Bilateral RLQ Bulb JP)                       Personal Care Assistance Level of  Assistance  Bathing, Feeding, Dressing Bathing Assistance: Maximum assistance Feeding assistance: Limited assistance Dressing Assistance: Maximum assistance     Functional Limitations Info  Sight, Hearing, Speech Sight Info: Impaired Hearing Info: Adequate Speech Info: Adequate    SPECIAL CARE FACTORS FREQUENCY  PT (By licensed PT), OT (By licensed OT), Speech therapy     PT Frequency: 5x/week OT Frequency: 5x/week            Contractures Contractures Info: Not present    Additional Factors Info  Code Status, Allergies Code Status Info: DNR Allergies Info: Allergies: No Known Allergies           Current Medications (10/03/2019):  This is the current hospital active medication list Current Facility-Administered Medications  Medication Dose Route Frequency Provider Last Rate Last Admin  . (feeding supplement) PROSource Plus liquid 30 mL  30 mL Oral BID BM Kc, Ramesh, MD   30 mL at 10/03/19 1534  . 0.9 %  sodium chloride infusion (Manually program via Guardrails IV Fluids)   Intravenous Once Mikey Bussing R, NP      . 0.9 %  sodium chloride infusion   Intravenous Continuous Donne Hazel, MD 75 mL/hr at 10/03/19 0933 New Bag at 10/03/19 0933  . 0.9 %  sodium chloride infusion   Intravenous PRN Antonieta Pert, MD 10 mL/hr at 10/03/19 0936 250 mL at 10/03/19 0936  . acetaminophen (TYLENOL) tablet 650 mg  650 mg Oral Q6H PRN Marylu Lund  K, MD   650 mg at 10/03/19 0539   Or  . acetaminophen (TYLENOL) suppository 650 mg  650 mg Rectal Q6H PRN Donne Hazel, MD      . albuterol (PROVENTIL) (2.5 MG/3ML) 0.083% nebulizer solution 3 mL  3 mL Inhalation BID Truitt Merle, MD   3 mL at 10/03/19 [phone]  . atenolol (TENORMIN) tablet 100 mg  100 mg Oral Daily Donne Hazel, MD   100 mg at 10/03/19 1058  . Chlorhexidine Gluconate Cloth 2 % PADS 6 each  6 each Topical Daily Donne Hazel, MD   6 each at 10/03/19 1000  . enoxaparin (LOVENOX) injection 40 mg  40 mg Subcutaneous Q24H Donne Hazel, MD   40 mg at 10/03/19 0758  . feeding supplement (BOOST / RESOURCE BREEZE) liquid 1 Container  1 Container Oral TID BM Antonieta Pert, MD   1 Container at 10/03/19 1534  . ferrous sulfate tablet 325 mg  325 mg Oral BID WC Kc, Ramesh, MD      . hydrocortisone (ANUSOL-HC) 2.5 % rectal cream 1 application  1 application Topical QID PRN Donne Hazel, MD      . loperamide (IMODIUM) capsule 2-4 mg  2-4 mg Oral Q6H PRN Donne Hazel, MD      . methocarbamol (ROBAXIN) tablet 500 mg  500 mg Oral Q6H PRN Donne Hazel, MD   500 mg at 10/02/19 0944  . nystatin (MYCOSTATIN) 100000 UNIT/ML suspension 500,000 Units  5 mL Oral QID Adrian Saran, RPH   500,000 Units at 10/03/19 1532  . ondansetron (ZOFRAN-ODT) disintegrating tablet 4 mg  4 mg Oral Q8H PRN Donne Hazel, MD      . oxyCODONE (Oxy IR/ROXICODONE) immediate release tablet 5 mg  5 mg Oral Q6H PRN Antonieta Pert, MD   5 mg at 10/03/19 0009  . pantoprazole (PROTONIX) EC tablet 40 mg  40 mg Oral Daily Donne Hazel, MD   40 mg at 10/03/19 1058  . piperacillin-tazobactam (ZOSYN) IVPB 3.375 g  3.375 g Intravenous Q8H Lenis Noon, [facility]   Held at 10/03/19 1448  . sodium chloride flush (NS) 0.9 % injection 10-40 mL  10-40 mL Intracatheter Q12H Antonieta Pert, MD   10 mL at 10/02/19 2135  . sodium chloride flush (NS) 0.9 % injection 10-40 mL  10-40 mL Intracatheter PRN Kc, Ramesh, MD      . traMADol (ULTRAM) tablet 50 mg  50 mg Oral Q8H PRN Donne Hazel, MD   50 mg at 10/02/19 4193     Discharge Medications: Please see discharge summary for a list of discharge medications.  Relevant Imaging Results:  Relevant Lab Results:   Additional Information SSN: [id number]  Lia Hopping, LCSW

## 2019-10-03 NOTE — Progress Notes (Signed)
Referring Physician(s): Wyline Copas  Supervising Physician: Sandi Mariscal  Patient Status:  [facility] - In-pt  Chief Complaint: Follow up RUQ (biloma?) drain placement 09/30/19 by Dr. Earleen Newport  Subjective:  Patient sitting up in bed, does not talk much but answers questions appropriately. He tells me he has been drinking his boost shakes but doesn't have much appetite. He is having abdominal pain at the drain site as well as in his lower abdomen. No issues with urination.  Allergies: Patient has no known allergies.  Medications: Prior to Admission medications   Medication Sig Start Date End Date Taking? Authorizing Provider  amoxicillin-clavulanate (AUGMENTIN) 875-125 MG tablet Take 1 tablet by mouth 2 (two) times daily for 10 days. 09/28/19 10/08/19 Yes Rai, Ripudeep K, MD  atenolol (TENORMIN) 100 MG tablet Take 1 tablet (100 mg total) by mouth daily. Hold until follow-up with your PCP Patient taking differently: Take 100 mg by mouth daily.  09/28/19  Yes Rai, Ripudeep K, MD  cetirizine (ZYRTEC) 10 MG tablet Take 10 mg by mouth daily.   Yes [provider]  diphenoxylate-atropine (LOMOTIL) 2.5-0.025 MG tablet Take 1-2 tablets by mouth 4 (four) times daily as needed for diarrhea or loose stools. Patient taking differently: Take 2 tablets by mouth 4 (four) times daily as needed for diarrhea or loose stools.  07/14/19  Yes Truitt Merle, MD  guaifenesin (HUMIBID E) 400 MG TABS tablet Take 1 tablet (400 mg total) by mouth every 6 (six) hours as needed. Patient taking differently: Take 400 mg by mouth in the morning and at bedtime.  05/13/19  Yes Geradine Girt, DO  hydrocortisone (ANUSOL-HC) 2.5 % rectal cream Apply 1 application topically 4 (four) times daily as needed for hemorrhoids. 09/28/19  Yes Rai, Ripudeep K, MD  lidocaine-prilocaine (EMLA) cream Apply 1 application topically as needed. Patient taking differently: Apply 1 application topically as needed (To access the port).  06/23/19  Yes Truitt Merle, MD  loperamide (IMODIUM) 2 MG capsule Take 1-2 capsules (2-4 mg total) by mouth every 6 (six) hours as needed for diarrhea or loose stools. 07/14/19  Yes Truitt Merle, MD  magnesium oxide (MAG-OX) 400 (241.3 Mg) MG tablet Take 1 tablet (400 mg total) by mouth daily. Patient taking differently: Take 400 mg by mouth every other day.  05/14/19  Yes Geradine Girt, DO  methocarbamol (ROBAXIN) 500 MG tablet Take 1 tablet (500 mg total) by mouth every 6 (six) hours as needed for muscle spasms. 09/28/19  Yes Rai, Ripudeep K, MD  ondansetron (ZOFRAN ODT) 4 MG disintegrating tablet Take 1 tablet (4 mg total) by mouth every 8 (eight) hours as needed for nausea or vomiting. 09/28/19  Yes Rai, Ripudeep K, MD  pantoprazole (PROTONIX) 40 MG tablet Take 1 tablet (40 mg total) by mouth daily. 09/28/19 01/26/20 Yes Rai, Ripudeep K, MD  potassium chloride SA (KLOR-CON) 20 MEQ tablet Take 2 tablets (40 mEq total) by mouth daily. 08/30/19  Yes Truitt Merle, MD  traMADol (ULTRAM) 50 MG tablet Take 1 tablet (50 mg total) by mouth every 8 (eight) hours as needed for moderate pain or severe pain. 09/28/19 09/27/20 Yes Rai, Ripudeep Raliegh Ip, MD     Vital Signs: BP 102/63 (BP Location: Left Arm)   Pulse 67   Temp 98.2 F (36.8 C) (Oral)   Resp 19   Ht 5\' 11"  (1.803 m)   Wt 153 lb (69.4 kg)   SpO2 96%   BMI 21.34 kg/m   Physical Exam Vitals  and nursing note reviewed.  Constitutional:      General: He is not in acute distress. HENT:     Head: Normocephalic.  Cardiovascular:     Rate and Rhythm: Normal rate.  Pulmonary:     Effort: Pulmonary effort is normal.  Abdominal:     Palpations: Abdomen is soft.     Tenderness: There is abdominal tenderness (at drain insertion site and suprapubic area).     Comments: (+) RUQ drain to suction with thick, cloudy, yellow output. Flushes easily without pain, some resistance with aspiration. Insertion site unremarkable, no leakage noted.  Skin:    General: Skin is warm and dry.    Neurological:     Mental Status: He is alert. Mental status is at baseline.     Imaging: CT ABDOMEN PELVIS W CONTRAST  Result Date: 09/30/2019 CLINICAL DATA:  Abdominal distension for 4 days. Pancreas cancer with recent ERCP EXAM: CT ABDOMEN AND PELVIS WITH CONTRAST TECHNIQUE: Multidetector CT imaging of the abdomen and pelvis was performed using the standard protocol following bolus administration of intravenous contrast. CONTRAST:  154mL OMNIPAQUE IOHEXOL 300 MG/ML  SOLN COMPARISON:  Seven days ago FINDINGS: Lower chest:  Trace pleural effusions.  No acute finding Hepatobiliary: Pneumobilia in the setting of pancreatic stent, which is patent and diffusely air-filled. No liver mass is seen. Liver scalloping described below.Cholelithiasis and gallbladder distension with mild thickening and regional fat stranding, also seen previously. Pancreas: Hypodensity in loss of architecture in the midline body correlating with the history. No evidence of acute pancreatitis Spleen: Enlarged in the setting of portal venous narrowing. Adrenals/Urinary Tract: Negative adrenals. Small kidneys. No hydronephrosis. Bilateral renal cystic densities. Unremarkable bladder. Stomach/Bowel:  No obstruction. No evidence of bowel inflammation. Vascular/Lymphatic: No acute vascular finding. There is portal confluence narrowing and gastric varices. No mass or adenopathy. Reproductive:No pathologic findings. Other: Interval development of organized peritoneal collection lateral to the liver which measures 20 x 6 cm in contains a few bubbles of gas. The collection is rim enhancing. There is new ascites in the gutters and moderate peritoneal collection in the pelvis with detectable wall thickening. These have occurred rapidly, more consistent with inflammatory collections than a manifestation of peritoneal tumor spread. Musculoskeletal: Degenerative changes without acute finding. 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  IMPRESSION: 1. 20 x 6 x 15 cm peritoneal or subcapsular organized collection along the right liver with a few bubbles of gas. The collection has developed rapidly and is likely an abscess or biloma. 2. Moderate ascites in the lower abdomen with smooth peritoneal thickening in the pelvis, new from prior and attributed to peritonitis rather than malignancy. 3. Recently replaced biliary stent which appears patent with diffuse gas filling. 4. Cholelithiasis. Improved but persistent inflammatory changes at the gallbladder wall. 5. 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 Electronically Signed   By: Monte Fantasia M.D.   On: 09/30/2019 06:19   CT IMAGE GUIDED DRAINAGE BY PERCUTANEOUS CATHETER  Result Date: 09/30/2019 INDICATION: 64 year old male with a history apparent biloma referred for drainage EXAM: CT GUIDED DRAINAGE OF  ABSCESS MEDICATIONS: The patient is currently admitted to the hospital and receiving intravenous antibiotics. The antibiotics were administered within an appropriate time frame prior to the initiation of the procedure. ANESTHESIA/SEDATION: 2.0 mg IV Versed 100 mcg IV Fentanyl Moderate Sedation Time:  15 minutes The patient was continuously monitored during the procedure by the interventional radiology nurse under my direct supervision. COMPLICATIONS: None TECHNIQUE: Informed written consent was  obtained from the patient after a thorough discussion of the procedural risks, benefits and alternatives. All questions were addressed. Maximal Sterile Barrier Technique was utilized including caps, mask, sterile gowns, sterile gloves, sterile drape, hand hygiene and skin antiseptic. A timeout was performed prior to the initiation of the procedure. PROCEDURE: The operative field was prepped with chlorhexidine in a sterile fashion, and a sterile drape was applied covering the operative field. A sterile gown and sterile gloves were used for the procedure. Local anesthesia was  provided with 1% Lidocaine. Patient was positioned supine position on the CT gantry table. Scout CT was acquired for planning purposes. Patient is prepped and draped in the usual sterile fashion. 1% lidocaine used for local anesthesia. Using CT guidance, trocar needle was advanced into the fluid collection of the right upper quadrant. Modified Seldinger technique was used to place a 12 French pigtail catheter drain. Approximately 840 cc of yellowish fluid was aspirated. Culture was sent to the lab. Catheter was sutured in position and attached to bulb suction. Final CT was acquired. Patient tolerated the procedure well and remained hemodynamically stable throughout. No complications were encountered and no significant blood loss. FINDINGS: Fluid collection of the right upper quadrant was successfully decompressed with pigtail catheter drainage. Cholelithiasis persist. Stent of the biliary system. IMPRESSION: Status post CT-guided drainage of right upper quadrant fluid collection. Signed, Dulcy Fanny. Dellia Nims, RPVI [facility] Radiology Electronically Signed   By: Corrie Mckusick D.O.   On: 09/30/2019 13:05    Labs:  CBC: Recent Labs    09/30/19 1328 10/01/19 0709 10/02/19 0318 10/03/19 0615  WBC 10.3 16.6* 17.3* 12.5*  HGB 7.6* 8.4* 7.9* 7.3*  HCT 24.1* 27.4* 25.5* 23.5*  PLT 135* 172 183 174    COAGS: Recent Labs    06/03/19 0146 09/23/19 0930 09/24/19 0418 09/30/19 0445  INR 1.0 1.4* 1.7* 1.6*    BMP: Recent Labs    09/30/19 0445 09/30/19 0445 09/30/19 1328 10/01/19 0709 10/02/19 0318 10/03/19 0615  NA 140  --   --  136 135 136  K 3.0*  --   --  3.3* 3.3* 3.1*  CL 104  --   --  104 106 107  CO2 22  --   --  22 21* 22  GLUCOSE 132*  --   --  107* 132* 140*  BUN 22  --   --  17 18 15   CALCIUM 8.5*  --   --  8.0* 7.6* 7.7*  CREATININE 0.94   < > 0.92 0.92 0.94 0.86  GFRNONAA >60   < > >60 >60 >60 >60  GFRAA >60   < > >60 >60  >60 >60   < > = values in this interval not displayed.    LIVER FUNCTION TESTS: Recent Labs    09/30/19 0445 10/01/19 0709 10/02/19 0318 10/03/19 0615  BILITOT 2.1* 1.6* 1.5* 1.4*  AST 20 18 19 22   ALT 15 13 15 14   ALKPHOS 117 97 93 105  PROT 6.1* 5.3* 5.3* 5.2*  ALBUMIN 2.4* 1.9* 1.8* 1.7*    Assessment and Plan:  64 y/o M s/p RUQ fluid collection drain placement 8/27 in IR seen today for routine follow up.  Patient with RUQ and suprapubic pain today, minimal appetite. Drain flushes easily but some resistance with aspiration. Output is thick, cloudy yellow. Per I/O 45 cc so far today with 160 cc yesterday. Tmax 98.2, WBC trending down - now 12.5 (was 17.3),  hgb also trending down now 7.3. No obvious bleeding in drain or from drain site. Culture from drain aspirate at time of placement showed NGTD.  Given continued significant output and recent placement would continue current drain management - upon d/c he will need to flush the drain once daily with 5 cc NS and record output each time drain is emptied or at least once daily. He will follow up with our service as an outpatient in 10-14 days for repeat imaging/possible drain injection - I have placed an order for this as well as written d/c instructions.   IR will continue to follow - please call with questions or concerns.   Electronically Signed: Joaquim Nam, PA-C 10/03/2019, 12:06 PM   I spent a total of 15 Minutes at the the patient's bedside AND on the patient's hospital floor or unit, greater than 50% of which was counseling/coordinating care for RUQ drain follow up.

## 2019-10-03 NOTE — Progress Notes (Signed)
PROGRESS NOTE    Isaac Doyle  BPZ:[id number] DOB: 17-Apr-1955 DOA: 09/30/2019 PCP: System, Pcp Not In   Chief Complaint  Patient presents with   Abdominal Pain   Brief Narrative: As per HPI: 64 y.o. male with medical history significant of hypertension, pancreatic cancer post common bile duct stent placement who was recently discharged following admission for severe sepsis with cholangitis, discharged with oral Augmentin on 09/28/2019, admitted 8/27  With worsening of upper quadrant abdominal pain with a developing "knot" in the upper abdomen, in ED WBC 13.3 K,CT abdomen pelvis with findings notable for a 20 x 6 x 15 cm peritoneal versus subcapsular organized collection along the right liver suspicious for abscess versus biloma.  General surgery was initially consulted who had recommended IR consultation.  Patient was admitted under hospitalist for further  Management. 8/27: Image guided right upper quadrant drain placement, FELT TO BE BLIOMA. Drain culture has been negative so far.  Patient has been doing well postop, no new complaints.  Tolerating diet.  Seen by PT OT and is deconditioned and planning for a skilled nursing facility.  TOC is working on it.  Subjective:  No acute events overnight.  Pain is controlled.  WBC downtrending, afebrile.   Drain output improving 160 mL.  Some pain at the drain site.  Assessment & Plan:  Blioma: presented with tight upper quadrant abdominal pain  And CT shows 20 X6X 15 cm peritoneal versus subcapsular collection suspicious for abscess versus biloma.s/p drain placement by IR right upper quadrant, felt to be blioma. Drain culture with abundant WBCs no organisms seen so far-WBC is now improving, afebrile.  On empiric Zosyn and if culture remains no growth, hopefully can discontinue antibiotics, spoke with IR who will take a look.  Continue pain control with opiates and Tylenol.  Anemia likely multifactorial from chronic disease/malignancy.  Monitor  hemoglobin transfuse if less than 7 g.  Add iron supplementation  Mild hypokalemia: Continue to replete.    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  Monitor outpatient.  Moderate pharyngeal-esophageal dysphagia, seen by SLP now on dysphagia/pured diet with thin liquid  Essential hypertension: BP is controlled on home atenolol.   Protein calorie malnutrition moderate with weight loss in the setting of cancer, consulted dietitian.  Augment diet. Has had low appetite and diarrhea and vomtting and has lost weight since being on chemo. SLP,dietitian.  Recent admission 8/2--8/25 for severe sepsis/acute cholangitis possible acute cholecystitis, AKI, metabolic acidosis dyspnea  DVT prophylaxis: enoxaparin (LOVENOX) injection 40 mg Start: 09/30/19 0845 Code Status:   Code Status: DNR  Family Communication: plan of care discussed with patient at bedside. I updated his daughter and is in agreement abt SNF.  Status is: Inpatient  Remains inpatient appropriate because:IV treatments appropriate due to intensity of illness or inability to take PO and Inpatient level of care appropriate due to severity of illness  Dispo: The patient is from: Home with daughter              Anticipated d/c is to: Home PT OT following has advised skilled nursing facility,onsulted TOC              Anticipated d/c date is: Once bed available  Patient currently is medically stable for discharge  Nutrition: Diet Order            DIET - DYS 1 Room service appropriate? Yes; Fluid consistency: Thin  Diet effective now               Body mass index is 21.34 kg/m.  Consultants:see note  Procedures:see note Microbiology:see  note Blood Culture    Component Value Date/Time   SDES  09/30/2019 1226    ABDOMEN ABSCESS Performed at [facility], [place] [address] [place], [place] [address]    SPECREQUEST  09/30/2019 1226    NONE Performed at [facility], [place] [address] [place], [place] [address]    CULT  09/30/2019 1226    NO GROWTH 1 DAY Performed at [facility], [place] [address] [place], [place] [address]    REPTSTATUS PENDING 09/30/2019 1226    Other culture-see note  Medications: Scheduled Meds:  (feeding supplement) PROSource Plus  30 mL Oral BID BM   sodium chloride   Intravenous Once   albuterol  3 mL Inhalation BID   atenolol  100 mg Oral Daily   Chlorhexidine Gluconate Cloth  6 each Topical Daily   enoxaparin (LOVENOX) injection  40 mg Subcutaneous Q24H   feeding supplement  1 Container Oral TID BM   pantoprazole  40 mg Oral Daily   sodium chloride flush  10-40 mL Intracatheter Q12H   Continuous Infusions:  sodium chloride 75 mL/hr at 10/03/19 0933   sodium chloride 250 mL (10/03/19 0936)   piperacillin-tazobactam (ZOSYN)  IV 3.375 g (10/03/19 0502)   potassium chloride 10 mEq (10/03/19 1053)    Antimicrobials: Anti-infectives (From admission, onward)   Start     Dose/Rate Route Frequency Ordered Stop   09/30/19 1500  piperacillin-tazobactam (ZOSYN) IVPB 3.375 g        3.375 g 12.5 mL/hr over 240 Minutes Intravenous Every 8 hours 09/30/19 0848     09/30/19 0845  piperacillin-tazobactam (ZOSYN) IVPB 3.375 g        3.375 g 100 mL/hr over 30 Minutes Intravenous  Once 09/30/19 0843 09/30/19 1225       Objective: Vitals: Today's Vitals   10/03/19 0200 10/03/19 0530 10/03/19 0738 10/03/19 0939  BP:  102/63    Pulse:  67    Resp:  19    Temp:  98.2 F (36.8 C)    TempSrc:  Oral    SpO2:  100%  96%  Weight:      Height:      PainSc: Asleep  4      Intake/Output Summary (Last 24 hours) at 10/03/2019 1127 Last data  filed at 10/03/2019 0950 Gross per 24 hour  Intake 1475.81 ml  Output 950 ml  Net 525.81 ml   Filed Weights   09/30/19 0431  Weight: 69.4 kg   Weight change:    Intake/Output from previous day: 08/29 0701 - 08/30 0700 In: 1965.4 [P.O.:120; I.V.:1701; IV Piggyback:144.4] Out: 935 [Urine:775; Drains:160] Intake/Output this shift: Total I/O In: 120 [P.O.:120] Out: 15 [Drains:15]  Examination: General exam: AAOx3, older for his age, NAD, weak appearing. HEENT:Oral mucosa moist, Ear/Nose WNL grossly, dentition normal. Respiratory system: bilaterally clear,no wheezing or crackles,no use of accessory muscle Cardiovascular system: S1 & S2 +, No JVD,. Gastrointestinal system: Abdomen soft, NT,ND, BS+, biliary drain is intact with greenish biliary output. Nervous System:Alert, awake, moving extremities and grossly nonfocal Extremities: No edema, distal peripheral pulses palpable.  Skin: No rashes,no icterus. MSK: Normal muscle bulk,tone, power  Data Reviewed: I have personally reviewed following labs and imaging studies CBC: Recent Labs  Lab 09/30/19 0445 09/30/19 1328 10/01/19 0709 10/02/19 0318 10/03/19 0615  WBC 13.3* 10.3 16.6* 17.3* 12.5*  NEUTROABS 10.8*  --   --   --   --   HGB 8.3* 7.6* 8.4* 7.9* 7.3*  HCT 26.3* 24.1* 27.4* 25.5* 23.5*  MCV 83.2 84.0 84.6 83.3 84.5  PLT 137* 135* 172 183 175   Basic Metabolic Panel: Recent Labs  Lab 09/27/19 0442 09/27/19 0442 09/28/19 0446 09/28/19 0446 09/30/19 0445 09/30/19 1328 10/01/19 0709 10/02/19 0318 10/03/19 0615  NA 134*   < > 139  --  140  --  136 135 136  K 3.7   < > 3.4*  --  3.0*  --  3.3* 3.3* 3.1*  CL 105   < > 107  --  104  --  104 106 107  CO2 18*   < > 20*  --  22  --  22 21* 22  GLUCOSE 109*   < > 117*  --  132*  --  107* 132* 140*  BUN 42*   < > 35*  --  22  --  17 18 15   CREATININE 1.73*   < > 1.44*   < > 0.94 0.92 0.92 0.94 0.86  CALCIUM 7.7*   < > 8.1*  --  8.5*  --  8.0* 7.6* 7.7*  MG 1.9  --    --   --   --   --   --   --   --   PHOS 3.9  --   --   --   --   --   --   --   --    < > = values in this interval not displayed.   GFR: Estimated Creatinine Clearance: 84.1 mL/min (by C-G formula based on SCr of 0.86 mg/dL). Liver Function Tests: Recent Labs  Lab 09/27/19 0442 09/30/19 0445 10/01/19 0709 10/02/19 0318 10/03/19 0615  AST 23 20 18 19 22   ALT 19 15 13 15 14   ALKPHOS 104 117 97 93 105  BILITOT 2.5* 2.1* 1.6* 1.5* 1.4*  PROT 5.5* 6.1* 5.3* 5.3* 5.2*  ALBUMIN 2.0* 2.4* 1.9* 1.8* 1.7*   No results for input(s): LIPASE, AMYLASE in the last 168 hours. No results for input(s): AMMONIA in the last 168 hours. Coagulation Profile: Recent Labs  Lab 09/30/19 0445  INR 1.6*   Cardiac Enzymes: No results for input(s): CKTOTAL, CKMB, CKMBINDEX, TROPONINI in the last 168 hours. BNP (last 3 results) No results for input(s): PROBNP in the last 8760 hours. HbA1C: No results for input(s): HGBA1C in the last 72 hours. CBG: No results for input(s): GLUCAP in the last 168 hours. Lipid Profile: No results for input(s): CHOL, HDL, LDLCALC, TRIG, CHOLHDL, LDLDIRECT in the last 72 hours. Thyroid Function Tests: No results for input(s): TSH, T4TOTAL, FREET4, T3FREE, THYROIDAB in the last 72 hours. Anemia Panel: No results for input(s): VITAMINB12, FOLATE, FERRITIN, TIBC, IRON, RETICCTPCT in the last 72 hours. Sepsis Labs: Recent Labs  Lab 09/30/19 0445  LATICACIDVEN 1.0    Recent Results (from the past 240 hour(s))  SARS Coronavirus 2 by RT PCR (hospital order, performed in [facility] hospital lab) Nasopharyngeal Nasopharyngeal Swab     Status: None   Collection Time: 09/30/19  7:44 AM   Specimen: Nasopharyngeal Swab  Result Value Ref Range Status  SARS Coronavirus 2 NEGATIVE NEGATIVE Final    Comment: (NOTE) SARS-CoV-2 target nucleic acids are NOT DETECTED.  The SARS-CoV-2 RNA is generally detectable in upper and lower respiratory specimens during the acute phase  of infection. The lowest concentration of SARS-CoV-2 viral copies this assay can detect is 250 copies / mL. A negative result does not preclude SARS-CoV-2 infection and should not be used as the sole basis for treatment or other patient management decisions.  A negative result may occur with improper specimen collection / handling, submission of specimen other than nasopharyngeal swab, presence of viral mutation(s) within the areas targeted by this assay, and inadequate number of viral copies (<250 copies / mL). A negative result must be combined with clinical observations, patient history, and epidemiological information.  Fact Sheet for Patients:   StrictlyIdeas.no  Fact Sheet for Healthcare Providers: BankingDealers.co.za  This test is not yet approved or  cleared by the [place] FDA and has been authorized for detection and/or diagnosis of SARS-CoV-2 by FDA under an Emergency Use Authorization (EUA).  This EUA will remain in effect (meaning this test can be used) for the duration of the COVID-19 declaration under Section 564(b)(1) of the Act, 21 U.S.C. section 360bbb-3(b)(1), unless the authorization is terminated or revoked sooner.  Performed at [facility], [place] [address], [place], [place] [address]   Aerobic/Anaerobic Culture (surgical/deep wound)     Status: None (Preliminary result)   Collection Time: 09/30/19 12:26 PM   Specimen: Abdomen; Abscess  Result Value Ref Range Status   Specimen Description   Final    ABDOMEN ABSCESS Performed at [place] [address] [place], [place] [address]    Special Requests   Final    NONE Performed at [facility], [place] [address], [place], [place] [address]    Gram Stain   Final    ABUNDANT WBC PRESENT,BOTH PMN AND MONONUCLEAR NO ORGANISMS SEEN    Culture   Final    NO GROWTH 1 DAY Performed at [facility], [place] [address] [place], [place] [address]    Report Status PENDING  Incomplete      Radiology Studies: No results found.   LOS: 3 days   Antonieta Pert, MD [facility]  10/03/2019, 11:27 AM

## 2019-10-03 NOTE — Progress Notes (Signed)
Occupational Therapy Treatment Patient Details Name: Isaac Doyle MRN: [id number] DOB: 05/09/1955 Today's Date: 10/03/2019    History of present illness As per HPI: 64 y.o. male with medical history significant of hypertension, pancreatic cancer post common bile duct stent placement who was recently discharged following admission for severe sepsis with cholangitis, Readmitted with abdominal pain and S/p drain placement by IR right upper quadrant, felt to be blioma      Follow Up Recommendations  SNF;Home health OT;Supervision/Assistance - 24 hour    Equipment Recommendations  3 in 1 bedside commode    Recommendations for Other Services      Precautions / Restrictions Precautions Precautions: Fall Precaution Comments: R side drain       Mobility Bed Mobility               General bed mobility comments: pt in chair  Transfers Overall transfer level: Needs assistance Equipment used: Rolling walker (2 wheeled) Transfers: Sit to/from [employer] Transfers Sit to Stand: Supervision Stand pivot transfers: Supervision       General transfer comment: VC to push up from recliner    Balance Overall balance assessment: Needs assistance Sitting-balance support: No upper extremity supported;Feet supported       Standing balance support: Bilateral upper extremity supported                               ADL either performed or assessed with clinical judgement   ADL Overall ADL's : Needs assistance/impaired Eating/Feeding: Set up;Sitting   Grooming: Set up;Wash/dry hands;Standing                   Toilet Transfer: Minimal assistance;Stand-pivot;BSC;Cueing for safety   Toileting- Clothing Manipulation and Hygiene: Moderate assistance;Sit to/from stand;Cueing for safety;Cueing for sequencing               Vision Patient Visual Report: No change from baseline            Cognition Arousal/Alertness: Awake/alert Behavior During  Therapy: WFL for tasks assessed/performed Overall Cognitive Status: Within Functional Limits for tasks assessed                                                     Pertinent Vitals/ Pain       Pain Assessment: No/denies pain     Prior Functioning/Environment              Frequency  Min 2X/week        Progress Toward Goals  OT Goals(current goals can now be found in the care plan section)  Progress towards OT goals: Progressing toward goals      AM-PAC OT "6 Clicks" Daily Activity     Outcome Measure   Help from another person eating meals?: None Help from another person taking care of personal grooming?: None Help from another person toileting, which includes using toliet, bedpan, or urinal?: A Little Help from another person bathing (including washing, rinsing, drying)?: A Little Help from another person to put on and taking off regular upper body clothing?: A Little Help from another person to put on and taking off regular lower body clothing?: A Little 6 Click Score: 20    End of Session Equipment Utilized During Treatment: Rolling walker;Gait belt  OT Visit  Diagnosis: Unsteadiness on feet (R26.81);Muscle weakness (generalized) (M62.81);Repeated falls (R29.6)   Activity Tolerance Patient tolerated treatment well   Patient Left in chair;with call bell/phone within reach;with chair alarm set   Nurse Communication Mobility status        Time: 1140-1157 OT Time Calculation (min): 17 min  Charges: OT General Charges $OT Visit: 1 Visit OT Treatments $Self Care/Home Management : 8-22 mins  Kari Baars, [place] [phone] Office- [place] 10/03/2019, 1:54 PM

## 2019-10-03 NOTE — TOC Initial Note (Addendum)
Transition of Care [facility]) - Initial/Assessment Note    Patient Details  Name: Isaac Doyle MRN: [id number] Date of Birth: 1955-09-24  Transition of [facility]) CM/SW Contact:    Lia Hopping, [place] Phone Number: 10/03/2019, 5:20 PM  Clinical Narrative:       Patient had recent admission  8/2--8/25 for severe sepsis/acute cholangitis possible acute cholecystitis, AKI, metabolic acidosis dyspnea. Patient was discharged home with [facility] home health services . Patient admitted 8/27 for abdominal pain.         CSW met with the patient at bedside to discuss discharge plan to SNF placement. Patient agreeable to go to rehab this admission. CSW faxed the [employer] completed application and requested clinicals to [place] for review pending a SNF bed [facility]. CSW provided update to the patient daughter [name].  TOC staff will continue to follow this patient.   Expected Discharge Plan: Skilled Nursing Facility Barriers to Discharge: No Barriers Identified   Patient Goals and CMS Choice Patient states their goals for this hospitalization and ongoing recovery are:: I will go to rehab CMS Medicare.gov Compare Post Acute Care list provided to:: Patient Choice offered to / list presented to : Patient, Adult Children  Expected Discharge Plan and Services Expected Discharge Plan: [place]   Discharge Planning Services: CM Consult   Living arrangements for the past 2 months: Apartment Expected Discharge Date:  (unknown)                                    Prior Living Arrangements/Services Living arrangements for the past 2 months: Apartment Lives with:: Self Patient language and need for interpreter reviewed:: Yes        Need for Family Participation in Patient Care: Yes (Comment) Care giver support system in place?: Yes (comment) Current home services: DME Criminal Activity/Legal Involvement Pertinent to Current  Situation/Hospitalization: No - Comment as needed  Activities of Daily Living Home Assistive Devices/Equipment: Cane (specify quad or straight), Eyeglasses, Walker (specify type) (single point cane, front wheeled walker) ADL Screening (condition at time of admission) Patient's cognitive ability adequate to safely complete daily activities?: Yes Is the patient deaf or have difficulty hearing?: No Does the patient have difficulty seeing, even when wearing glasses/contacts?: No Does the patient have difficulty concentrating, remembering, or making decisions?: No Patient able to express need for assistance with ADLs?: Yes Does the patient have difficulty dressing or bathing?: No Independently performs ADLs?: Yes (appropriate for developmental age) Does the patient have difficulty walking or climbing stairs?: Yes (secndary to weakness) Weakness of Legs: Both Weakness of Arms/Hands: None  Permission Sought/Granted   Permission granted to share information with : Yes, Verbal Permission Granted  Share Information with NAME: Milagros Evener     Permission granted to share info w Relationship: Daughter  Permission granted to share info w Contact Information: [phone]  Emotional Assessment Appearance:: Appears stated age Attitude/Demeanor/Rapport: Engaged Affect (typically observed): Accepting Orientation: : Oriented to Self, Oriented to Place, Oriented to  Time, Oriented to Situation Alcohol / Substance Use: Not Applicable Psych Involvement: No (comment)  Admission diagnosis:  Right upper quadrant abdominal pain [R10.11] Intraabdominal fluid collection [R18.8] Abdominal pain [R10.9] Biloma following surgery [Q22.29NL, K66.8] Patient Active Problem List   Diagnosis Date Noted  . Abdominal pain 09/30/2019  . Elevated LFTs   . RUQ abdominal pain   . Severe sepsis with acute organ dysfunction (  [place]) 09/23/2019  . 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  Anemia of chronic disease 09/23/2019  . Cholangitis 09/23/2019  . Acute cholecystitis   . Port-A-Cath in place 08/17/2019  . Symptomatic anemia 08/08/2019  . Malignant obstructive jaundice ([place]) 06/03/2019  . Malignant neoplasm of pancreas ([place])   . ARF (acute renal failure) ([place]) 05/10/2019  . Essential hypertension 05/10/2019  . Hypokalemia 05/10/2019  . Normochromic normocytic anemia 05/10/2019  . 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 Jaundice 05/09/2019   PCP:  System, Pcp Not In Pharmacy:   [facility] [place], [place] [address] Phone: [phone] Fax: [phone]     Social Determinants of Health (SDOH) Interventions    Readmission Risk Interventions No flowsheet data found.

## 2019-10-03 NOTE — Consult Note (Addendum)
Referring Provider: Dr. Antonieta Pert Primary Care Physician:  System, Pcp Not In Primary Gastroenterologist:  Dr. Havery Moros  Reason for Consultation:  Pancreatic cancer malignant biliary stricture with stent in place, dysphagia   HPI: Isaac Doyle is a 64 y.o. male Isaac Doyle is a 64 y.o. male history of hypertension.  He was diagnosed with pancreatic cancer with a malignant biliary stricture April 2021 by oncologist Dr. Burr Medico. He was admitted to the hospital 5/4/201 with obstructive jaundice. An abdominal ultrasound showed a distended gallbladder with multiple stones and sludge with common bile duct and intrahepatic ductal dilatation. A MRCP showed biliary obstruction from a likely pancreatic malignancy. He underwent an ERCP 05/11/2019 by Dr. Henrene Pastor which identified a malignant distal bile duct stricture status post ERCP with biliary stent placement.  His LFTs drifted downward and he was discharged home on 05/13/2019. An outpatient EUS was done 05/19/2019 which identified a large pancreatic mass encasing the PV and SMA.  Pathology reports were positive for adenocarcinoma.  The previously placed plastic biliary stent was in the CBD in appropriate position.   He was readmitted to the hospital 06/03/2019 with obstructive jaundice.  A repeat ERCP was done 4/30 which showed the previously plastic stent had occluded and migrated partially into the duodenum.  The stent was removed.  The main bile duct was moderately dilated secondary to a distal malignant appearing 2 cm stricture, the right and left hepatic ducts were moderately dilated.  A 10 French by 6 cm covered metal stent was placed 5 cm into the common bile duct.  The stent was in good position post procedure.  LFTs did downward and he was discharged home on 06/04/2019.  He is followed by oncologist Dr. Annamaria Boots.  Chemotherapy with FOLFIRINOX every 2 weeks started on  06/16/2019.  He was readmitted to the hospital on 08/08/2019 due to having symptomatic anemia  in the setting of pancreatic cancer with ongoing chemotherapy.  His hemoglobin level was 9.0. (Hg 8.2 on 08/04/2019).  Iron 22.  TIBC 182.  He received 2 units of packed red blood cells.  His alk phos level was 161.  AST 36.  ALT 34.  Total bili 2.7.  MRI/MRCP was done which showed multiple dependent gallstones, mild gallbladder wall thickening severely degenerated images due to motion artifact poor definition of the common hepatic duct and common bile duct but no intrahepatic biliary dilatation and therefore stent felt to be patent. His hemoglobin was 8.9 at the time of discharge on 7/7.  He was started on Pantoprazole 40 mg daily for GI prophylaxis.  He presented to [facility] 09/23/2019 due to having nausea, vomiting x 2 (emesis consisted of partially undigested food, nonbloody emesis) and nonbloody diarrhea which started yesterday.  His chemotherapy was stopped last week due to worsening anemia which required a blood transfusion.  He was started on Ciprofloxacin 7 days ago due to having increased right upper quadrant pain concerning for possible cholangitis. WBC 26. He as placed on IV Zosyn for ascending cholangitis. S/P repeat ERCP with stent exchange (uncovered metal stent) to the common bile duct on 8/21. The biliary tree was swept and sludge removed. His leukocytosis improved.  His LFTs normalized.  He was discharged home on 09/28/2019 on Augmentin 875 mg 1 p.o. twice daily for 10 days.  He developed worsening upper abdominal pain and he was readmitted to [facility] hospital on 09/30/2019: In the ED, his WBC was 13.3.  An abdominal/pelvic CT was notable for  a 20 x 6 x 15 cm peritoneal versus of capsular organized collection along the right liver suspicious for abscess versus biloma.  He underwent a percutaneous drain by IR on 09/30/2019.  Cultures pending.  No plans for further surgical intervention.  A GI consult was requested due to the patient's complaint of dysphagia.  He complains of having  dysphagia which started during his first ERCP 05/11/2019.  He first noted a throat soreness and a globus sensation.  A few weeks later he had difficulty swallowing pills, thick liquids and pured food.  Approximately 2 weeks ago, he ate a piece of chicken which became stuck in his throat.  He gagged, coughed and the food was expelled.  Since his hospital readmission, he continues to have dysphagia.  He describes feeling as if food, thick liquids or his medications get stuck high in his throat.  He drinks water and the stuck pill or food passes down the esophagus.  He denies any odynophagia.  No history of oral thrush.  He continues to have upper abdominal pain predominantly to the right upper quadrant.  No lower abdominal pain.  His last bowel movement was 8/25.  He is passing gas per the rectum.  No other complaints at this time.  He underwent a modified barium swallow with speech pathology on 8/29.  See results below.  Dysphagia to solids and thin liquids were recommended.  ORAL Phase   -Decreased lingual control which led to premature loss of the bolus.  This allowed material to fall to the level of the pyriform sinuses prior to the swallow trigger.   -Decreased lingual motion most notable during dual textured solids which led to delayed A/P transpor of the bolus.  -Mild oral residue.   PHARYNGEAL Phase    -Decreased laryngeal elevation.   -Decreased UES opening that appeared to be at least partly mechanical from bulging seen off the spine at the level of the UES.  This led to mild pyriform sinus residue.   -Penetration into the laryngeal vestibule was seen x 1 during the swallow given first teaspoon sip of thin liquids.  It was not replicated.  Cued cough and reswallow was effective to clear material from the laryngeal vestibule.    ESOPHAGEAL Phase   -Sweep revealed it slow to clear with material noted in the cervical esophagus.  Retrograde flow of the bolus was noted.  Liquid wash was not  helpful to clear material.      Abdominal/pelvic CT with contrast 09/30/2019: 1. 20 x 6 x 15 cm peritoneal or subcapsular organized collection along the right liver with a few bubbles of gas. The collection has developed rapidly and is likely an abscess or biloma. 2. Moderate ascites in the lower abdomen with smooth peritoneal thickening in the pelvis, new from prior and attributed to peritonitis rather than malignancy. 3. Recently replaced biliary stent which appears patent with diffuse gas filling. 4. Cholelithiasis. Improved but persistent inflammatory changes at the gallbladder wall. 5. Pancreas carcinoma with portal confluence narrowing and gastric varices   Past Medical History:  Diagnosis Date   Hypertension     Past Surgical History:  Procedure Laterality Date   BILIARY STENT PLACEMENT  05/11/2019   Procedure: BILIARY STENT PLACEMENT;  Surgeon: Irene Shipper, MD;  Location: [facility] ENDOSCOPY;  Service: Endoscopy;;   BILIARY STENT PLACEMENT  06/03/2019   Procedure: BILIARY STENT PLACEMENT;  Surgeon: Jackquline Denmark, MD;  Location: [facility] ENDOSCOPY;  Service: Endoscopy;;   BILIARY STENT PLACEMENT N/A 09/24/2019  Procedure: BILIARY STENT PLACEMENT;  Surgeon: Ladene Artist, MD;  Location: [facility] ENDOSCOPY;  Service: Endoscopy;  Laterality: N/A;   ERCP N/A 05/11/2019   Procedure: ENDOSCOPIC RETROGRADE CHOLANGIOPANCREATOGRAPHY (ERCP);  Surgeon: Irene Shipper, MD;  Location: [facility] ENDOSCOPY;  Service: Endoscopy;  Laterality: N/A;  with    ERCP N/A 06/03/2019   Procedure: ENDOSCOPIC RETROGRADE CHOLANGIOPANCREATOGRAPHY (ERCP);  Surgeon: Jackquline Denmark, MD;  Location: [facility] ENDOSCOPY;  Service: Endoscopy;  Laterality: N/A;   ERCP N/A 09/24/2019   Procedure: ENDOSCOPIC RETROGRADE CHOLANGIOPANCREATOGRAPHY (ERCP);  Surgeon: Ladene Artist, MD;  Location: [facility] ENDOSCOPY;  Service: Endoscopy;  Laterality: N/A;   ESOPHAGOGASTRODUODENOSCOPY (EGD) WITH PROPOFOL N/A 05/19/2019   Procedure:  ESOPHAGOGASTRODUODENOSCOPY (EGD) WITH PROPOFOL;  Surgeon: [name], MD;  Location: [facility] ENDOSCOPY;  Service: Endoscopy;  Laterality: N/A;   EUS N/A 05/19/2019   Procedure: UPPER ENDOSCOPIC ULTRASOUND (EUS) LINEAR;  Surgeon: [name], MD;  Location: [facility] ENDOSCOPY;  Service: Endoscopy;  Laterality: N/A;   FINE NEEDLE ASPIRATION N/A 05/19/2019   Procedure: FINE NEEDLE ASPIRATION (FNA) LINEAR;  Surgeon: [name], MD;  Location: [facility] ENDOSCOPY;  Service: Endoscopy;  Laterality: N/A;   IR IMAGING GUIDED PORT INSERTION  06/08/2019   REMOVAL OF STONES  09/24/2019   Procedure: REMOVAL OF STONES;  Surgeon: Ladene Artist, MD;  Location: [facility] ENDOSCOPY;  Service: Endoscopy;;   SPHINCTEROTOMY  05/11/2019   Procedure: [name];  Surgeon: Irene Shipper, MD;  Location: [facility] ENDOSCOPY;  Service: Endoscopy;;   STENT REMOVAL  06/03/2019   Procedure: STENT REMOVAL;  Surgeon: Jackquline Denmark, MD;  Location: [facility] ENDOSCOPY;  Service: Endoscopy;;   STENT REMOVAL  09/24/2019   Procedure: STENT REMOVAL;  Surgeon: Ladene Artist, MD;  Location: [facility] ENDOSCOPY;  Service: Endoscopy;;    Prior to Admission medications   Medication Sig Start Date End Date Taking? Authorizing Provider  amoxicillin-clavulanate (AUGMENTIN) 875-125 MG tablet Take 1 tablet by mouth 2 (two) times daily for 10 days. 09/28/19 10/08/19 Yes Rai, Ripudeep K, MD  atenolol (TENORMIN) 100 MG tablet Take 1 tablet (100 mg total) by mouth daily. Hold until follow-up with your PCP Patient taking differently: Take 100 mg by mouth daily.  09/28/19  Yes Rai, Ripudeep K, MD  cetirizine (ZYRTEC) 10 MG tablet Take 10 mg by mouth daily.   Yes [provider]  diphenoxylate-atropine (LOMOTIL) 2.5-0.025 MG tablet Take 1-2 tablets by mouth 4 (four) times daily as needed for diarrhea or loose stools. Patient taking differently: Take 2 tablets by mouth 4 (four) times daily as needed for diarrhea or loose stools.  07/14/19  Yes Truitt Merle, MD    guaifenesin (HUMIBID E) 400 MG TABS tablet Take 1 tablet (400 mg total) by mouth every 6 (six) hours as needed. Patient taking differently: Take 400 mg by mouth in the morning and at bedtime.  05/13/19  Yes Geradine Girt, DO  hydrocortisone (ANUSOL-HC) 2.5 % rectal cream Apply 1 application topically 4 (four) times daily as needed for hemorrhoids. 09/28/19  Yes Rai, Ripudeep K, MD  lidocaine-prilocaine (EMLA) cream Apply 1 application topically as needed. Patient taking differently: Apply 1 application topically as needed (To access the port).  06/23/19  Yes Truitt Merle, MD  loperamide (IMODIUM) 2 MG capsule Take 1-2 capsules (2-4 mg total) by mouth every 6 (six) hours as needed for diarrhea or loose stools. 07/14/19  Yes Truitt Merle, MD  magnesium oxide (MAG-OX) 400 (241.3 Mg) MG tablet Take 1 tablet (400 mg total) by mouth daily.  Patient taking differently: Take 400 mg by mouth every other day.  05/14/19  Yes Geradine Girt, DO  methocarbamol (ROBAXIN) 500 MG tablet Take 1 tablet (500 mg total) by mouth every 6 (six) hours as needed for muscle spasms. 09/28/19  Yes Rai, Ripudeep K, MD  ondansetron (ZOFRAN ODT) 4 MG disintegrating tablet Take 1 tablet (4 mg total) by mouth every 8 (eight) hours as needed for nausea or vomiting. 09/28/19  Yes Rai, Ripudeep K, MD  pantoprazole (PROTONIX) 40 MG tablet Take 1 tablet (40 mg total) by mouth daily. 09/28/19 01/26/20 Yes Rai, Ripudeep K, MD  potassium chloride SA (KLOR-CON) 20 MEQ tablet Take 2 tablets (40 mEq total) by mouth daily. 08/30/19  Yes Truitt Merle, MD  traMADol (ULTRAM) 50 MG tablet Take 1 tablet (50 mg total) by mouth every 8 (eight) hours as needed for moderate pain or severe pain. 09/28/19 09/27/20 Yes Rai, Ripudeep Raliegh Ip, MD    Current Facility-Administered Medications  Medication Dose Route Frequency Provider Last Rate Last Admin   (feeding supplement) PROSource Plus liquid 30 mL  30 mL Oral BID BM Kc, Ramesh, MD   30 mL at 10/03/19 1059   0.9 %  sodium  chloride infusion (Manually program via Guardrails IV Fluids)   Intravenous Once Mikey Bussing R, NP       0.9 %  sodium chloride infusion   Intravenous Continuous Donne Hazel, MD 75 mL/hr at 10/03/19 0933 New Bag at 10/03/19 0933   0.9 %  sodium chloride infusion   Intravenous PRN Antonieta Pert, MD 10 mL/hr at 10/03/19 0936 250 mL at 10/03/19 0936   acetaminophen (TYLENOL) tablet 650 mg  650 mg Oral Q6H PRN Donne Hazel, MD   650 mg at 10/03/19 5885   Or   acetaminophen (TYLENOL) suppository 650 mg  650 mg Rectal Q6H PRN Donne Hazel, MD       albuterol (PROVENTIL) (2.5 MG/3ML) 0.083% nebulizer solution 3 mL  3 mL Inhalation BID Truitt Merle, MD   3 mL at 10/03/19 0277   atenolol (TENORMIN) tablet 100 mg  100 mg Oral Daily Donne Hazel, MD   100 mg at 10/03/19 1058   Chlorhexidine Gluconate Cloth 2 % PADS 6 each  6 each Topical Daily Donne Hazel, MD   6 each at 10/03/19 1000   enoxaparin (LOVENOX) injection 40 mg  40 mg Subcutaneous Q24H Donne Hazel, MD   40 mg at 10/03/19 0758   feeding supplement (BOOST / RESOURCE BREEZE) liquid 1 Container  1 Container Oral TID BM Antonieta Pert, MD   1 Container at 10/03/19 1058   ferrous sulfate tablet 325 mg  325 mg Oral BID WC Kc, Ramesh, MD       hydrocortisone (ANUSOL-HC) 2.5 % rectal cream 1 application  1 application Topical QID PRN Donne Hazel, MD       loperamide (IMODIUM) capsule 2-4 mg  2-4 mg Oral Q6H PRN Donne Hazel, MD       methocarbamol (ROBAXIN) tablet 500 mg  500 mg Oral Q6H PRN Donne Hazel, MD   500 mg at 10/02/19 0944   ondansetron (ZOFRAN-ODT) disintegrating tablet 4 mg  4 mg Oral Q8H PRN Donne Hazel, MD       oxyCODONE (Oxy IR/ROXICODONE) immediate release tablet 5 mg  5 mg Oral Q6H PRN Kc, Ramesh, MD   5 mg at 10/03/19 0009   pantoprazole (PROTONIX) EC tablet 40 mg  40 mg  Oral Daily Donne Hazel, MD   40 mg at 10/03/19 1058   piperacillin-tazobactam (ZOSYN) IVPB 3.375 g  3.375 g  Intravenous Q8H Lenis Noon, RPH 12.5 mL/hr at 10/03/19 0502 3.375 g at 10/03/19 0502   sodium chloride flush (NS) 0.9 % injection 10-40 mL  10-40 mL Intracatheter Q12H Kc, Maren Beach, MD   10 mL at 10/02/19 2135   sodium chloride flush (NS) 0.9 % injection 10-40 mL  10-40 mL Intracatheter PRN Antonieta Pert, MD       traMADol (ULTRAM) tablet 50 mg  50 mg Oral Q8H PRN Donne Hazel, MD   50 mg at 10/02/19 0256    Allergies as of 09/30/2019   (No Known Allergies)    Family History  Problem Relation Age of Onset   Lymphoma Mother 25       cancer in lymph nodes, unsure of primary   Heart attack Father    Lupus Sister     Social History   Socioeconomic History   Marital status: Divorced    Spouse name: Not on file   Number of children: 1   Years of education: Not on file   Highest education level: Not on file  Occupational History   Occupation: disabled veteran   Tobacco Use   Smoking status: Never Smoker   Smokeless tobacco: Never Used   Tobacco comment: plan to quit completely   Substance and Sexual Activity   Alcohol use: Yes    Alcohol/week: 22.0 standard drinks    Types: 12 Cans of beer, 10 Shots of liquor per week   Drug use: Not Currently   Sexual activity: Not on file  Other Topics Concern   Not on file  Social History Narrative   Not on file   Social Determinants of Health   Financial Resource Strain:    Difficulty of Paying Living Expenses: Not on file  Food Insecurity:    Worried About [place] in the Last Year: Not on file   Ran Out of Food in the Last Year: Not on file  Transportation Needs:    Lack of Transportation (Medical): Not on file   Lack of Transportation (Non-Medical): Not on file  Physical Activity:    Days of Exercise per Week: Not on file   Minutes of Exercise per Session: Not on file  Stress:    Feeling of Stress : Not on file  Social Connections:    Frequency of Communication with Friends and  Family: Not on file   Frequency of Social Gatherings with Friends and Family: Not on file   Attends Religious Services: Not on file   Active Member of Clubs or Organizations: Not on file   Attends Archivist Meetings: Not on file   Marital Status: Not on file  Intimate Partner Violence:    Fear of Current or Ex-Partner: Not on file   Emotionally Abused: Not on file   Physically Abused: Not on file   Sexually Abused: Not on file    Review of Systems: See HPI all other systems reviewed and are negative   Physical Exam: Vital signs in last 24 hours: Temp:  [97.9 F (36.6 C)-98.2 F (36.8 C)] 98.2 F (36.8 C) (08/30 0530) Pulse Rate:  [67-83] 67 (08/30 0530) Resp:  [16-19] 19 (08/30 0530) BP: (100-111)/(61-71) 102/63 (08/30 0530) SpO2:  [96 %-100 %] 96 % (08/30 0939) Last BM Date: 09/28/19 General: Alert 64 year old male in no acute distress. Head:  Normocephalic  and atraumatic. Eyes:  No scleral icterus. Conjunctiva pink. Ears:  Normal auditory acuity. Nose:  No deformity, discharge or lesions. Mouth:  Dentition intact. No ulcers or lesions.  No evidence of oral thrush. Neck:  Supple. No lymphadenopathy or thyromegaly.  Lungs: Breath sounds clear throughout. Heart: Rate and rhythm, no murmurs. Abdomen: Soft, nondistended.  Positive bowel sounds all 4 quadrants.  Right upper quadrant and central abdominal tenderness without rebound or guarding.  Upper quadrant JP drain intact with yellow/green serous fluid. Rectal: Deferred. Musculoskeletal:  Symmetrical without gross deformities.  Pulses:  Normal pulses noted. Extremities:  Without clubbing or edema. Neurologic:  Alert and  oriented x4. No focal deficits.  Skin:  Intact without significant lesions or rashes. Psych:  Alert and cooperative. Normal mood and affect.  Intake/Output from previous day: 08/29 0701 - 08/30 0700 In: 1965.4 [P.O.:120; I.V.:1701; IV Piggyback:144.4] Out: 935 [Urine:775;  Drains:160] Intake/Output this shift: Total I/O In: 120 [P.O.:120] Out: 15 [Drains:15]  Lab Results: Recent Labs    10/01/19 0709 10/02/19 0318 10/03/19 0615  WBC 16.6* 17.3* 12.5*  HGB 8.4* 7.9* 7.3*  HCT 27.4* 25.5* 23.5*  PLT 172 183 174   BMET Recent Labs    10/01/19 0709 10/02/19 0318 10/03/19 0615  NA 136 135 136  K 3.3* 3.3* 3.1*  CL 104 106 107  CO2 22 21* 22  GLUCOSE 107* 132* 140*  BUN _0 CREATININE 0.92 0.94 0.86  CALCIUM 8.0* 7.6* 7.7*   LFT Recent Labs    10/03/19 0615  PROT 5.2*  ALBUMIN 1.7*  AST 22  ALT 14  ALKPHOS 105  BILITOT 1.4*    Studies/Results: No results found.  IMPRESSION/PLAN:  53.  63 year old male with pancreatic cancer and biliary stricture S/P repeat ERCP with stent exchange (uncovered metal stent) to the common bile duct on 8/21. The biliary tree was swept and sludge removed.  Ascending cholangitis was treated with IV Zosyn and his leukocytosis improved and his LFTs drifted downward.  He was discharged home 8/25 on Augmentin. Readmitted to [facility] on 8/27 with worsening right upper quadrant abdominal pain found to have a large 20 x 6 x 15 cm biloma along the right liver. S/P perc drain by IR 8/27. Fluid culture pending. Recently placed stent intact.  WBC 17.3 -> 12.5. T. Bili 1.4. Normal AST/ALT/Alk phos levels.  -Continue Zosyn -Repeat CBC and hepatic panel in am -Continue Pantoprazole 57m QD  2.  Oral phase dysphagia -No plans for endoscopic evaluation -May consider barium swallow with tablet if symptoms worsen -Empirically treat for possible esophageal candidiasis with Nystatin  -Continue dysphagia 2 diet  3.  Hypokalemia. K + 3.1. -KCL replacement per the hospitalist   4. Anemia. Hg 7.3. No evidence of active GI  Bleeding. On oral iron.  -Transfuse for Hg < 7   Further recommendations per Dr. SNehemiah SettleMDorathy Daft 10/03/2019, 11:56 AM      Attending Physician Note   I have taken a history,  examined the patient and reviewed the chart. I agree with the Advanced Practitioner's note, impression and recommendations.  * Pancreatic cancer * Biliary obstruction d/t pancreatic cancer with recent biliary stent exchange to an uncovered metal stent * Recent cholangitis, cholecystitis with cholelithiasis * Large biloma along right hepatic lobe, drain placed by IR. Biloma / bile leak not noted on 8/20 CT AP and 8/21  ERCP. Mgmt per surgery and IR.  * Pharyngeal dysphagia since April. Esophageal dysmotility is a  potential additional factor. SLP evaluation reviewed. No significant esophageal findings at 8/20 ERCP.  Dietary and swallowing adjustments per SLP and patient preference. Empiric treatment for possible candidasis with a course of Nystatin. No further GI evaluation at this time. Consider barium esophagram if symptoms worsen. GI signing off. Outpatient GI follow with Dr. Havery Moros if needed however GI office follow up not necessary at this time.   Lucio Edward, MD [facility]astro[facility]

## 2019-10-03 NOTE — Progress Notes (Addendum)
HEMATOLOGY-ONCOLOGY PROGRESS NOTE  SUBJECTIVE: Status post drainage of his right upper quadrant fluid collection on 09/30/2019.  Fluid sent for culture and no growth to date.  He reports that he feels much better today.  He has mild abdominal discomfort near the pigtail insertion site.  No fevers or chills.  No nausea or vomiting.  No bleeding reported.  Oncology History Overview Note  Cancer Staging Malignant neoplasm of pancreas [facility]) Staging form: Exocrine Pancreas, AJCC 8th Edition - Clinical stage from 05/19/2019: Stage III (cT4, cN1, cM0) - Signed by Truitt Merle, MD on 05/24/2019    Malignant neoplasm of pancreas ([place])  05/10/2019 Tumor Marker   Baseline  CEA at 31.1 Ca 19-9 at 2411   05/11/2019 Procedure   ERCP by Dr Henrene Pastor 05/11/19  IMPRESSION 1. Malignant appearing distal bile duct stricture with upstream dilation. Status post ERCP with sphincterotomy and biliary stent placement   05/13/2019 Imaging   CT Chest and Pancreas 05/13/19 IMPRESSION: 1. Interval placement of common bile duct stent with decompression of the bile ducts. Pneumobilia is now noted compatible with biliary patency. 2. Diffuse infiltrative process involving the head, neck, body and tail of pancreas is identified. The diffusely infiltrative appearance of the pancreas is somewhat unusual. Although favored to represent pancreatic adenocarcinoma, other etiologies to consider include IgG4-related Sclerosing Disease of the pancreas. 3. There is encasement and narrowing of the portal venous confluence and proximal portal vein. Mild soft tissue stranding extends to but does not encase the superior mesenteric artery. No convincing evidence for involvement of the celiac trunk. 4. Borderline enlarged portacaval node. No convincing evidence for liver metastasis or metastatic disease to the chest. 5. Aortic atherosclerosis. Aortic Atherosclerosis (ICD10-I70.0).   05/19/2019 Initial Diagnosis   Malignant neoplasm of pancreas  ([place])   05/19/2019 Cancer Staging   Staging form: Exocrine Pancreas, AJCC 8th Edition - Clinical stage from 05/19/2019: Stage III (cT4, cN1, cM0) - Signed by Truitt Merle, MD on 05/24/2019   05/19/2019 Procedure   EUS by Dr Ardis Hughs 05/19/19 - Large mass involving much of the pancreatic parenchyma, clear encasing the PV and possibly involving the SMA as well. The mass was sampled with 3 transduodenal EUS FNB passes and the preliminary cytology was positive for malignancy (adenocarcinoma). - Previously placed plastic biliary stent was in the CBD in good position.    05/19/2019 Initial Biopsy   A. 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   06/08/2019 Procedure   PAC placed    06/16/2019 -  Chemotherapy   FOLFIRINOX q2weeks starting 06/16/19   08/04/2019 Imaging   US Abdomen  IMPRESSION: 1. Gallbladder mildly distended with sludge and tiny gallstones. No gallbladder wall thickening or pericholecystic fluid.   2. Biliary stent present with pneumobilia, likely due to stent present.   3. Much of pancreas obscured by gas. Visualized portions of pancreas appear grossly unremarkable.   4. Increased renal echogenicity, likely indicative of medical renal disease. No obstructing focus in either kidney. Cysts noted in each kidney.   08/10/2019 Imaging   MRI  IMPRESSION: 1. Infiltrative hypoenhancement in the pancreatic body, most of the pancreatic tail, and extending into a significant portion of the pancreatic head. This appearance in combination with the abnormal cytology on prior FNA is suspicious for an infiltrative pancreatic cancer involving most of  the parenchyma of the pancreas, with sparing of the tip of the pancreatic tail and a small portion of the pancreatic head. 2. The  common hepatic duct and common bile duct is obscured by low signal over an approximately 4.7 cm segment, although the lack of intrahepatic biliary dilatation suggests that the stent is still in place and functional. 3. Cholelithiasis with mild gallbladder wall thickening. There is some accentuation of enhancement along the gallbladder fossa which can sometimes correlate with gallbladder inflammation causing local hyperemia. 4. Trace ascites.      REVIEW OF SYSTEMS:   Constitutional: Denies fevers, chills  Respiratory: Denies cough, dyspnea or wheezes Cardiovascular: Denies palpitation, chest discomfort Gastrointestinal: Has mild abdominal discomfort at the insertion site of the drain, no nausea or vomiting Skin: Denies abnormal skin rashes Lymphatics: Denies new lymphadenopathy or easy bruising Neurological:Denies numbness, tingling or new weaknesses Behavioral/Psych: Mood is stable, no new changes  Extremities: No lower extremity edema All other systems were reviewed with the patient and are negative.  I have reviewed the past medical history, past surgical history, social history and family history with the patient and they are unchanged from previous note.   PHYSICAL EXAMINATION: ECOG PERFORMANCE STATUS: 1 - Symptomatic but completely ambulatory  Vitals:   10/02/19 2131 10/03/19 0530  BP: 100/61 102/63  Pulse: 83 67  Resp: 16 19  Temp: 98.1 F (36.7 C) 98.2 F (36.8 C)  SpO2: 100% 100%   Filed Weights   09/30/19 0431  Weight: 69.4 kg    Intake/Output from previous day: 08/29 0701 - 08/30 0700 In: 1965.4 [P.O.:120; I.V.:1701; IV Piggyback:144.4] Out: 935 [Urine:775; Drains:160]  GENERAL:alert, no distress and comfortable SKIN: skin color, texture, turgor are normal, no rashes or significant lesions EYES: Mild scleral icterus OROPHARYNX:no exudate, no erythema and lips, buccal mucosa, and tongue normal  LUNGS: clear to auscultation and percussion with normal  breathing effort HEART: regular rate & rhythm and no murmurs and no lower extremity edema ABDOMEN: Positive bowel sounds, nondistended, mild tenderness in the right upper quadrant at drain insertion site NEURO: alert & oriented x 3 with fluent speech, no focal motor/sensory deficits  LABORATORY DATA:  I have reviewed the data as listed CMP Latest Ref Rng & Units 10/03/2019 10/02/2019 10/01/2019  Glucose 70 - 99 mg/dL 140(H) 132(H) 107(H)  BUN 8 - 23 mg/dL 15 18 17   Creatinine 0.61 - 1.24 mg/dL 0.86 0.94 0.92  Sodium 135 - 145 mmol/L 136 135 136  Potassium 3.5 - 5.1 mmol/L 3.1(L) 3.3(L) 3.3(L)  Chloride 98 - 111 mmol/L 107 106 104  CO2 22 - 32 mmol/L 22 21(L) 22  Calcium 8.9 - 10.3 mg/dL 7.7(L) 7.6(L) 8.0(L)  Total Protein 6.5 - 8.1 g/dL 5.2(L) 5.3(L) 5.3(L)  Total Bilirubin 0.3 - 1.2 mg/dL 1.4(H) 1.5(H) 1.6(H)  Alkaline Phos 38 - 126 U/L 105 93 97  AST 15 - 41 U/L 22 19 18   ALT 0 - 44 U/L 14 15 13     Lab Results  Component Value Date   WBC 12.5 (H) 10/03/2019   HGB 7.3 (L) 10/03/2019   HCT 23.5 (L) 10/03/2019   MCV 84.5 10/03/2019   PLT 174 10/03/2019   NEUTROABS 10.8 (H) 09/30/2019    CT ABDOMEN PELVIS WO CONTRAST  Result Date: 09/23/2019 CLINICAL DATA:  Nausea and vomiting. Weakness and hypertension for 1 week. History of pancreatic cancer. EXAM: CT ABDOMEN AND PELVIS WITHOUT CONTRAST TECHNIQUE: Multidetector CT imaging of the abdomen and pelvis was performed following the standard  protocol without IV contrast. COMPARISON:  08/10/2019 MRI.  CT of 05/13/2019 FINDINGS: Lower chest: Clear lung bases. Normal heart size without pericardial or pleural effusion. Hepatobiliary: Limited evaluation for focal liver lesion. Common duct stent in place, with pneumobilia suggesting stent patency. Gallstones. Mild gallbladder distension with pericholecystic edema, including on 34/3. Pancreas: The pancreatic primary is poorly evaluated on this noncontrast exam. No gross pancreatic duct dilatation.  No convincing evidence of pancreatitis. Spleen: Normal in size, without focal abnormality. Adrenals/Urinary Tract: Normal adrenal glands. No renal calculi or hydronephrosis. 2.0 cm interpolar left renal low-density lesion is likely a cyst. Mild right renal cortical thinning. No renal calculi or hydronephrosis. No bladder calculi. Stomach/Bowel: Normal stomach, without wall thickening. Scattered colonic diverticula. Normal terminal ileum and appendix. Normal small bowel. Vascular/Lymphatic: No gross arterial encasement. No abdominopelvic adenopathy. An 11 mm upper normal portal caval node is similar to 12 mm on the prior. 21/3 today. Reproductive: Normal prostate. Other: No significant free fluid. No evidence of omental or peritoneal disease. Musculoskeletal: Prior trauma or surgery involving right iliac crest. Mild wedging of the T12 vertebral body, chronic. IMPRESSION: 1. Findings highly suspicious for acute cholecystitis. Gallbladder distension and pericholecystic edema in the setting of gallstones. Depending on clinical concern, ultrasound could confirm. 2. Suboptimal evaluation of the pancreatic primary secondary to noncontrast technique. Common duct stent in place with pneumobilia. 3.  Aortic Atherosclerosis (ICD10-I70.0). Electronically Signed   By: Abigail Miyamoto M.D.   On: 09/23/2019 11:05   CT ABDOMEN PELVIS W CONTRAST  Result Date: 09/30/2019 CLINICAL DATA:  Abdominal distension for 4 days. Pancreas cancer with recent ERCP EXAM: CT ABDOMEN AND PELVIS WITH CONTRAST TECHNIQUE: Multidetector CT imaging of the abdomen and pelvis was performed using the standard protocol following bolus administration of intravenous contrast. CONTRAST:  160mL OMNIPAQUE IOHEXOL 300 MG/ML  SOLN COMPARISON:  Seven days ago FINDINGS: Lower chest:  Trace pleural effusions.  No acute finding Hepatobiliary: Pneumobilia in the setting of pancreatic stent, which is patent and diffusely air-filled. No liver mass is seen. Liver  scalloping described below.Cholelithiasis and gallbladder distension with mild thickening and regional fat stranding, also seen previously. Pancreas: Hypodensity in loss of architecture in the midline body correlating with the history. No evidence of acute pancreatitis Spleen: Enlarged in the setting of portal venous narrowing. Adrenals/Urinary Tract: Negative adrenals. Small kidneys. No hydronephrosis. Bilateral renal cystic densities. Unremarkable bladder. Stomach/Bowel:  No obstruction. No evidence of bowel inflammation. Vascular/Lymphatic: No acute vascular finding. There is portal confluence narrowing and gastric varices. No mass or adenopathy. Reproductive:No pathologic findings. Other: Interval development of organized peritoneal collection lateral to the liver which measures 20 x 6 cm in contains a few bubbles of gas. The collection is rim enhancing. There is new ascites in the gutters and moderate peritoneal collection in the pelvis with detectable wall thickening. These have occurred rapidly, more consistent with inflammatory collections than a manifestation of peritoneal tumor spread. Musculoskeletal: Degenerative changes without acute finding. 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 IMPRESSION: 1. 20 x 6 x 15 cm peritoneal or subcapsular organized collection along the right liver with a few bubbles of gas. The collection has developed rapidly and is likely an abscess or biloma. 2. Moderate ascites in the lower abdomen with smooth peritoneal thickening in the pelvis, new from prior and attributed to peritonitis rather than malignancy. 3. Recently replaced biliary stent which appears patent with diffuse gas filling. 4. Cholelithiasis. Improved but persistent inflammatory changes at the gallbladder wall. 5. Pancreas carcinoma with  portal confluence narrowing and gastric varices. Electronically Signed   By: Monte Fantasia M.D.   On: 09/30/2019 06:19   DG CHEST PORT 1 VIEW  Result Date:  09/26/2019 CLINICAL DATA:  Shortness of breath EXAM: PORTABLE CHEST 1 VIEW COMPARISON:  09/23/2019 FINDINGS: Right Port-A-Cath remains in place, unchanged. Bibasilar airspace opacities, favor atelectasis. Small bilateral effusions suspected. Heart is normal size. No edema. No acute bony abnormality. IMPRESSION: Small bilateral pleural effusions with bibasilar opacities, likely atelectasis. Electronically Signed   By: Rolm Baptise M.D.   On: 09/26/2019 18:28   DG Chest Portable 1 View  Result Date: 09/23/2019 CLINICAL DATA:  Cough.  History of pancreatic cancer EXAM: PORTABLE CHEST 1 VIEW COMPARISON:  08/08/2019 FINDINGS: The heart size and mediastinal contours are within normal limits. Both lungs are clear. The visualized skeletal structures are unremarkable. Port-A-Cath tip in the lower SVC unchanged from the prior study. IMPRESSION: No active disease. Electronically Signed   By: Franchot Gallo M.D.   On: 09/23/2019 12:38   DG ERCP BILIARY & PANCREATIC DUCTS  Result Date: 09/24/2019 CLINICAL DATA:  Pancreas cancer, biliary obstruction occluded stent EXAM: ERCP balloon sweep and stent exchange TECHNIQUE: Multiple spot images obtained with the fluoroscopic device and submitted for interpretation post-procedure. FLUOROSCOPY TIME:  Fluoroscopy Time:  2 minutes 29 seconds COMPARISON:  09/23/2019 CT FINDINGS: Limited ERCP demonstrates removal of the covered biliary stent. Balloon sweep performed. This was followed by placement of a new metallic self expanding biliary stent in a similar position. Final image demonstrates residual pneumobilia with decompression of the biliary tree. IMPRESSION: Limited imaging during ERCP with distal CBD stent exchanged These images were submitted for radiologic interpretation only. Please see the procedural report for the amount of contrast and the fluoroscopy time utilized. Electronically Signed   By: Jerilynn Mages.  Shick M.D.   On: 09/24/2019 11:57   CT IMAGE GUIDED DRAINAGE BY  PERCUTANEOUS CATHETER  Result Date: 09/30/2019 INDICATION: 64 year old male with a history apparent biloma referred for drainage EXAM: CT GUIDED DRAINAGE OF  ABSCESS MEDICATIONS: The patient is currently admitted to the hospital and receiving intravenous antibiotics. The antibiotics were administered within an appropriate time frame prior to the initiation of the procedure. ANESTHESIA/SEDATION: 2.0 mg IV Versed 100 mcg IV Fentanyl Moderate Sedation Time:  15 minutes The patient was continuously monitored during the procedure by the interventional radiology nurse under my direct supervision. COMPLICATIONS: None TECHNIQUE: Informed written consent was obtained from the patient after a thorough discussion of the procedural risks, benefits and alternatives. All questions were addressed. Maximal Sterile Barrier Technique was utilized including caps, mask, sterile gowns, sterile gloves, sterile drape, hand hygiene and skin antiseptic. A timeout was performed prior to the initiation of the procedure. PROCEDURE: The operative field was prepped with chlorhexidine in a sterile fashion, and a sterile drape was applied covering the operative field. A sterile gown and sterile gloves were used for the procedure. Local anesthesia was provided with 1% Lidocaine. Patient was positioned supine position on the CT gantry table. Scout CT was acquired for planning purposes. Patient is prepped and draped in the usual sterile fashion. 1% lidocaine used for local anesthesia. Using CT guidance, trocar needle was advanced into the fluid collection of the right upper quadrant. Modified Seldinger technique was used to place a 12 French pigtail catheter drain. Approximately 840 cc of yellowish fluid was aspirated. Culture was sent to the lab. Catheter was sutured in position and attached to bulb suction. Final CT was acquired. Patient tolerated  the procedure well and remained hemodynamically stable throughout. No complications were encountered  and no significant blood loss. FINDINGS: Fluid collection of the right upper quadrant was successfully decompressed with pigtail catheter drainage. Cholelithiasis persist. Stent of the biliary system. IMPRESSION: Status post CT-guided drainage of right upper quadrant fluid collection. Signed, Dulcy Fanny. Dellia Nims, RPVI [facility] Electronically Signed   By: Corrie Mckusick D.O.   On: 09/30/2019 13:05   US Abdomen Limited RUQ  Result Date: 09/23/2019 CLINICAL DATA:  Nausea and vomiting. History of pancreatic cancer and biliary stent EXAM: ULTRASOUND ABDOMEN LIMITED RIGHT UPPER QUADRANT COMPARISON:  CT 09/23/2019 FINDINGS: Gallbladder: Gallbladder lumen is completely filled with heterogeneous sludge and small shadowing stones. Irregular wall thickening with associated pericholecystic fluid. No sonographic Murphy sign noted by sonographer. Common bile duct: Diameter: 4 mm.  Pneumobilia noted. Liver: Reverberation artifact within the intrahepatic biliary ducts compatible with pneumobilia. No focal lesion identified. Within normal limits in parenchymal echogenicity. Portal vein is patent on color Doppler imaging with normal direction of blood flow towards the liver. Other: None. IMPRESSION: 1. Abnormal appearance of the gallbladder which is distended by sludge and small stones. Irregular gallbladder wall thickening and pericholecystic edema. Findings are suspicious for acute cholecystitis. 2. Pneumobilia. Electronically Signed   By: Davina Poke D.O.   On: 09/23/2019 11:43    ASSESSMENT AND PLAN: 1.  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  Abdominal pain secondary to peritoneal abscess versus biloma, improved 3.  Leukocytosis 4.  Anemia 5.  Mild thrombocytopenia, resolved 6.  Hyperbilirubinemia, improved 7.  Protein calorie malnutrition/weight loss 8.  Alcohol abuse 9.  Hypertension 10.  History of severe  trauma disability  -We will continue to hold chemotherapy until he improves. -Status post drainage of fluid collection by IR with improvement of his pain.  Culture negative to date. -Hemoglobin down to 7.3 today and I will give 1 unit PRBCs.  Discussed risks/benefits of transfusion and he is agreeable. -Speech therapy has evaluated and has recommended dysphagia 2 diet with thin liquids.  Dietitian is following. -Monitor liver function closely.   LOS: 3 days   Mikey Bussing, DNP, AGPCNP-BC, AOCNP 10/03/19   Addendum I have seen the patient, examined him. I agree with the assessment and and plan and have edited the notes.   Pt is very frustrated due to his frequent hospital admission, fatigue and pain, he understands his cancer prognosis is poor. I reviewed and discussed his recent CT scan with pt which showed no new metastasis,but difficult to evaluate his response to chemo.  I explained to him that we need to hold chemo, due to his current condition, very poor nutrition and low PS. He may not be a candidate for [facility]. Radiation can be considered, but his pancreatic cancer involves majority of his pancrease, which makes radiation challenge also. I encourage him to focus on recovery, he may need rehab. I will f/u as needed before discharge, and see him back in my office in 2-3 weeks. Will review his case in GI tumor board next week.   Truitt Merle  10/03/2019

## 2019-10-04 ENCOUNTER — Encounter (HOSPITAL_COMMUNITY): Payer: Self-pay | Admitting: Internal Medicine

## 2019-10-04 DIAGNOSIS — R188 Other ascites: Secondary | ICD-10-CM

## 2019-10-04 DIAGNOSIS — T85590A Other mechanical complication of bile duct prosthesis, initial encounter: Secondary | ICD-10-CM

## 2019-10-04 LAB — COMPREHENSIVE METABOLIC PANEL
ALT: 13 U/L (ref 0–44)
AST: 19 U/L (ref 15–41)
Albumin: 1.8 g/dL — ABNORMAL LOW (ref 3.5–5.0)
Alkaline Phosphatase: 105 U/L (ref 38–126)
Anion gap: 9 (ref 5–15)
BUN: 15 mg/dL (ref 8–23)
CO2: 19 mmol/L — ABNORMAL LOW (ref 22–32)
Calcium: 7.7 mg/dL — ABNORMAL LOW (ref 8.9–10.3)
Chloride: 106 mmol/L (ref 98–111)
Creatinine, Ser: 0.8 mg/dL (ref 0.61–1.24)
GFR calc Af Amer: >60 mL/min (ref >60)
GFR calc non Af Amer: >60 mL/min (ref >60)
Glucose, Bld: 116 mg/dL — ABNORMAL HIGH (ref 70–99)
Potassium: 3.2 mmol/L — ABNORMAL LOW (ref 3.5–5.1)
Sodium: 134 mmol/L — ABNORMAL LOW (ref 135–145)
Total Bilirubin: 1.4 mg/dL — ABNORMAL HIGH (ref 0.3–1.2)
Total Protein: 5.4 g/dL — ABNORMAL LOW (ref 6.5–8.1)

## 2019-10-04 LAB — BPAM RBC
Blood Product Expiration Date: 202109192359
ISSUE DATE / TIME: 202108301501
Unit Type and Rh: 6200

## 2019-10-04 LAB — CBC
HCT: 26.3 % — ABNORMAL LOW (ref 39.0–52.0)
Hemoglobin: 8.3 g/dL — ABNORMAL LOW (ref 13.0–17.0)
MCH: 26.7 pg (ref 26.0–34.0)
MCHC: 31.6 g/dL (ref 30.0–36.0)
MCV: 84.6 fL (ref 80.0–100.0)
Platelets: 205 10*3/uL (ref 150–400)
RBC: 3.11 MIL/uL — ABNORMAL LOW (ref 4.22–5.81)
RDW: 16.6 % — ABNORMAL HIGH (ref 11.5–15.5)
WBC: 13.5 10*3/uL — ABNORMAL HIGH (ref 4.0–10.5)
nRBC: 0 % (ref 0.0–0.2)

## 2019-10-04 LAB — TYPE AND SCREEN
ABO/RH(D): A POS
Antibody Screen: NEGATIVE
Unit division: 0

## 2019-10-04 LAB — SARS CORONAVIRUS 2 (TAT 6-24 HRS): SARS Coronavirus 2: NEGATIVE

## 2019-10-04 MED ORDER — ALBUTEROL SULFATE (2.5 MG/3ML) 0.083% IN NEBU: 3.0000 mL | INHALATION_SOLUTION | RESPIRATORY_TRACT | Status: DC | PRN

## 2019-10-04 MED ORDER — POTASSIUM CHLORIDE CRYS ER 20 MEQ PO TBCR
40.0000 meq | EXTENDED_RELEASE_TABLET | Freq: Once | ORAL | Status: AC
  Administered 2019-10-04: 10:00:00 40 meq via ORAL
  Filled 2019-10-04: qty 2

## 2019-10-04 NOTE — Progress Notes (Signed)
Physical Therapy Treatment Patient Details Name: Isaac Doyle MRN: [id number] DOB: 19-Aug-1955 Today's Date: 10/04/2019    History of Present Illness As per HPI: 64 y.o. male with medical history significant of hypertension, pancreatic cancer post common bile duct stent placement who was recently discharged following admission for severe sepsis with cholangitis, Readmitted with abdominal pain and S/p drain placement by IR right upper quadrant, felt to be blioma    PT Comments    Pt OOB in recliner "feeling better".  Assisted withy amb in hallway.  General Gait Details: tolerated an increased distance with x 1 standing rest break mid way.    Follow Up Recommendations  SNF     Equipment Recommendations       Recommendations for Other Services       Precautions / Restrictions Precautions Precautions: Fall Precaution Comments: R side drain    Mobility  Bed Mobility               General bed mobility comments: OOB in recliner  Transfers Overall transfer level: Needs assistance Equipment used: Rolling walker (2 wheeled) Transfers: Sit to/from [employer] Transfers Sit to Stand: Supervision Stand pivot transfers: Supervision       General transfer comment: 25% VC to push up from recliner and safety with turns  Ambulation/Gait Ambulation/Gait assistance: Supervision;Min guard Gait Distance (Feet): 84 Feet Assistive device: Rolling walker (2 wheeled) Gait Pattern/deviations: Step-through pattern;Decreased step length - right;Decreased step length - left;Trunk flexed Gait velocity: decreased   General Gait Details: tolerated an increased distance with x 1 standing rest break mid way   Stairs             Wheelchair Mobility    Modified Rankin (Stroke Patients Only)       Balance                                            Cognition Arousal/Alertness: Awake/alert Behavior During Therapy: WFL for tasks  assessed/performed Overall Cognitive Status: Within Functional Limits for tasks assessed                                 General Comments: AxO x 3 pleasant      Exercises      General Comments        Pertinent Vitals/Pain Pain Assessment: Faces Faces Pain Scale: Hurts a little bit Pain Location: R abdomen at drain site Pain Descriptors / Indicators: Grimacing;Discomfort Pain Intervention(s): Monitored during session    Home Living                      Prior Function            PT Goals (current goals can now be found in the care plan section) Progress towards PT goals: Progressing toward goals    Frequency    Min 3X/week      PT Plan Current plan remains appropriate    Co-evaluation              AM-PAC PT "6 Clicks" Mobility   Outcome Measure  Help needed turning from your back to your side while in a flat bed without using bedrails?: A Little Help needed moving from lying on your back to sitting on the side of a flat bed without using bedrails?:  A Little Help needed moving to and from a bed to a chair (including a wheelchair)?: A Little Help needed standing up from a chair using your arms (e.g., wheelchair or bedside chair)?: A Little Help needed to walk in hospital room?: A Little Help needed climbing 3-5 steps with a railing? : A Lot 6 Click Score: 17    End of Session Equipment Utilized During Treatment: Gait belt Activity Tolerance: Patient tolerated treatment well Patient left: in chair;with call bell/phone within reach Nurse Communication: Mobility status PT Visit Diagnosis: Muscle weakness (generalized) (M62.81);Difficulty in walking, not elsewhere classified (R26.2);Unsteadiness on feet (R26.81)     Time: 3546-5681 PT Time Calculation (min) (ACUTE ONLY): 12 min  Charges:  $Gait Training: 8-22 mins                     Rica Koyanagi  PTA Acute  Rehabilitation Services Pager      [phone] Office       [phone]

## 2019-10-04 NOTE — TOC Progression Note (Addendum)
Transition of Care [facility]) - Progression Note    Patient Details  Name: Isaac Doyle MRN: [id number] Date of Birth: [birth year]  [facility] CM/SW [place], LCSW Phone Number: 10/04/2019, 5:01 PM  Clinical Narrative:    [facility] after the [facility] review, the patient does not have longterm care or skilled benefits and will need to use his [employer] benefits.  CSW informed the patient and his daughter of the [place] findings. CSW provided a list to SNF bed offers. Patient and his daughter accepted bed offer at [facility].  CSW left voicemail for [facility] to confirm at bed.  Patient in need of covid test prior.    Expected Discharge Plan: Skilled Nursing Facility Barriers to Discharge: Other (comment) (covid test)  Expected Discharge Plan and Services Expected Discharge Plan: [place]   Discharge Planning Services: CM Consult   Living arrangements for the past 2 months: Apartment Expected Discharge Date:  (unknown)                                     Social Determinants of Health (SDOH) Interventions    Readmission Risk Interventions No flowsheet data found.

## 2019-10-04 NOTE — Progress Notes (Signed)
PROGRESS NOTE    Isaac Doyle  RFF:[id number] DOB: 12/21/1955 DOA: 09/30/2019 PCP: System, Pcp Not In   Chief Complaint  Patient presents with  . Abdominal Pain   Brief Narrative: As per HPI: 64 y.o. male with medical history significant of hypertension, pancreatic cancer post common bile duct stent placement who was recently discharged following admission for severe sepsis with cholangitis, discharged with oral Augmentin on 09/28/2019, admitted 8/27  With worsening of upper quadrant abdominal pain with a developing "knot" in the upper abdomen, in ED WBC 13.3 K,CT abdomen pelvis with findings notable for a 20 x 6 x 15 cm peritoneal versus subcapsular organized collection along the right liver suspicious for abscess versus biloma.  General surgery was initially consulted who had recommended IR consultation.  Patient was admitted under hospitalist for further  Management. 8/27: Image guided right upper quadrant drain placement, FELT TO BE BLIOMA. Drain culture has been negative so far.  Patient has been doing well postop, no new complaints.  Tolerating diet.  Seen by PT OT and is deconditioned and planning for a skilled nursing facility.  TOC is working on it.  Subjective:  Patient reports he had a good bowel movement yesterday which he needed after a long time.  Resting on the bedside chair.  No new complaints.  Biliary drain is intact.  Awaiting on placement.    Assessment & Plan:  Blioma: presented with tight upper quadrant abdominal pain  And CT shows 20 X6X 15 cm peritoneal versus subcapsular collection suspicious for abscess versus biloma.s/p drain placement by IR right upper quadrant, felt to be blioma. Drain culture with abundant WBCs no organisms seen so far, reintubated for better growth for 5 days.  WBC slightly up.  On empiric Zosyn hopefully can stop after today.   Anemia likely multifactorial from chronic disease/malignancy.  Transfused 1 unit PRBC by oncology and hemoglobin  improved to 8.3 g.  Cont iron supplementation  Mild hypokalemia: Continue to replete.    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  Monitor outpatient.  Moderate pharyngeal-esophageal dysphagia, seen by SLP now on dysphagia 1 /pured diet with thin liquid.  GI has seen the patient and per GI "* Pharyngeal dysphagia since April. Esophageal dysmotility is a potential additional factor.No significant esophageal findings at 8/20 ERCP.  Dietary and swallowing adjustments per SLP and patient preference. Empiric treatment for possible candidasis with a course of Nystatin. No further GI evaluation at this time. Consider barium esophagram if symptoms worsen. GI signing off. Outpatient GI follow with Dr. Havery Moros if needed however"   Essential hypertension: BP is controlled on home atenolol.   Protein calorie malnutrition moderate with weight loss in the setting of cancer, consulted dietitian.  Augment diet. Has had low appetite and diarrhea and vomtting and has lost weight since being on chemo. SLP,dietitian.  Recent admission 8/2--8/25 for severe sepsis/acute cholangitis possible acute cholecystitis, AKI, metabolic acidosis dyspnea  DVT prophylaxis: enoxaparin (LOVENOX) injection 40 mg Start: 09/30/19 0845 Code Status:   Code Status: DNR  Family Communication: plan of care discussed with patient at bedside. I updated his daughter and is in agreement abt SNF.  Status is: Inpatient  Remains inpatient appropriate because:IV treatments appropriate due to intensity of illness or inability to take  PO and Inpatient level of care appropriate due to severity of illness  Dispo: The patient is from: Home with daughter               Anticipated d/c is to: SNF              Anticipated d/c date is: ONCE BED AVAILABLE              Patient currently is medically stable for discharge  Nutrition: Diet Order            DIET - DYS 1 Room service appropriate? Yes; Fluid consistency: Thin  Diet effective now               Body mass index is 21.34 kg/m.  Consultants:see note  Procedures:see note Microbiology:see note Blood Culture    Component Value Date/Time   SDES  09/30/2019 1226    ABDOMEN ABSCESS Performed at [facility], [place] [address] [place], [place] [address]    SPECREQUEST  09/30/2019 1226    NONE Performed at [facility], [place] [address] [place], [place] [address]    CULT  09/30/2019 1226    CULTURE REINCUBATED FOR BETTER GROWTH NO ANAEROBES ISOLATED; CULTURE IN PROGRESS FOR 5 DAYS    REPTSTATUS PENDING 09/30/2019 1226    Other culture-see note  Medications: Scheduled Meds: . (feeding supplement) PROSource Plus  30 mL Oral BID BM  . sodium chloride   Intravenous Once  . albuterol  3 mL Inhalation BID  . atenolol  100 mg Oral Daily  . Chlorhexidine Gluconate Cloth  6 each Topical Daily  . enoxaparin (LOVENOX) injection  40 mg Subcutaneous Q24H  . feeding supplement  1 Container Oral TID BM  . ferrous sulfate  325 mg Oral BID WC  . nystatin  5 mL Oral QID  . pantoprazole  40 mg Oral Daily  . sodium chloride flush  10-40 mL Intracatheter Q12H   Continuous Infusions: . sodium chloride 75 mL/hr at 10/04/19 0918  . sodium chloride 250 mL (10/03/19 0936)  . piperacillin-tazobactam (ZOSYN)  IV 3.375 g (10/04/19 0524)    Antimicrobials: Anti-infectives (From admission, onward)   Start     Dose/Rate Route Frequency Ordered Stop   09/30/19 1500  piperacillin-tazobactam (ZOSYN) IVPB 3.375 g        3.375 g 12.5 mL/hr over 240 Minutes Intravenous Every 8 hours 09/30/19 0848     09/30/19 0845  piperacillin-tazobactam (ZOSYN) IVPB 3.375 g        3.375  g 100 mL/hr over 30 Minutes Intravenous  Once 09/30/19 0843 09/30/19 1225       Objective: Vitals: Today's Vitals   10/03/19 2200 10/04/19 0616 10/04/19 0800 10/04/19 0921  BP:  113/77    Pulse:  73    Resp:  18    Temp:  98.7 F (37.1 C)    TempSrc:  Oral    SpO2:  100%  99%  Weight:      Height:      PainSc: 0-No pain  5      Intake/Output Summary (Last 24 hours) at 10/04/2019 1201 Last data filed at 10/04/2019 1000 Gross per 24 hour  Intake 3034.36 ml  Output 1940 ml  Net 1094.36 ml   Filed Weights   09/30/19 0431  Weight: 69.4 kg   Weight change:    Intake/Output from previous day: 08/30 0701 - 08/31 0700 In: 3185.2 [P.O.:1250; I.V.:1465.9; Blood:362; IV Piggyback:107.3] Out: 1665 [Urine:1350;  Drains:315] Intake/Output this shift: Total I/O In: 577.4 [P.O.:240; I.V.:294.8; IV Piggyback:42.7] Out: 290 [Urine:200; Drains:90]  Examination: General exam: AAOx3 , NAD, weak appearing. HEENT:Oral mucosa moist, Ear/Nose WNL grossly, dentition normal. Respiratory system: bilaterally clear,no wheezing or crackles,no use of accessory muscle. Cardiovascular system: S1 & S2 +,No JVD. Gastrointestinal system: Abdomen soft, NT,ND, BS+. RUQ Biliary drain+ Nervous System:Alert, awake, moving extremities and grossly nonfocal. Extremities: No edema, distal peripheral pulses palpable.  Skin: No rashes,no icterus. MSK: Normal muscle bulk,tone, power.  Data Reviewed: I have personally reviewed following labs and imaging studies CBC: Recent Labs  Lab 09/30/19 0445 09/30/19 0445 09/30/19 1328 10/01/19 0709 10/02/19 0318 10/03/19 0615 10/04/19 0438  WBC 13.3*   < > 10.3 16.6* 17.3* 12.5* 13.5*  NEUTROABS 10.8*  --   --   --   --   --   --   HGB 8.3*   < > 7.6* 8.4* 7.9* 7.3* 8.3*  HCT 26.3*   < > 24.1* 27.4* 25.5* 23.5* 26.3*  MCV 83.2   < > 84.0 84.6 83.3 84.5 84.6  PLT 137*   < > 135* 172 183 174 205   < > = values in this interval not displayed.   Basic Metabolic  Panel: Recent Labs  Lab 09/30/19 0445 09/30/19 0445 09/30/19 1328 10/01/19 0709 10/02/19 0318 10/03/19 0615 10/04/19 0438  NA 140  --   --  136 135 136 134*  K 3.0*  --   --  3.3* 3.3* 3.1* 3.2*  CL 104  --   --  104 106 107 106  CO2 22  --   --  22 21* 22 19*  GLUCOSE 132*  --   --  107* 132* 140* 116*  BUN 22  --   --  17 18 15 15   CREATININE 0.94   < > 0.92 0.92 0.94 0.86 0.80  CALCIUM 8.5*  --   --  8.0* 7.6* 7.7* 7.7*   < > = values in this interval not displayed.   GFR: Estimated Creatinine Clearance: 90.4 mL/min (by C-G formula based on SCr of 0.8 mg/dL). Liver Function Tests: Recent Labs  Lab 09/30/19 0445 10/01/19 0709 10/02/19 0318 10/03/19 0615 10/04/19 0438  AST 20 18 19 22 19   ALT 15 13 15 14 13   ALKPHOS 117 97 93 105 105  BILITOT 2.1* 1.6* 1.5* 1.4* 1.4*  PROT 6.1* 5.3* 5.3* 5.2* 5.4*  ALBUMIN 2.4* 1.9* 1.8* 1.7* 1.8*   No results for input(s): LIPASE, AMYLASE in the last 168 hours. No results for input(s): AMMONIA in the last 168 hours. Coagulation Profile: Recent Labs  Lab 09/30/19 0445  INR 1.6*   Cardiac Enzymes: No results for input(s): CKTOTAL, CKMB, CKMBINDEX, TROPONINI in the last 168 hours. BNP (last 3 results) No results for input(s): PROBNP in the last 8760 hours. HbA1C: No results for input(s): HGBA1C in the last 72 hours. CBG: No results for input(s): GLUCAP in the last 168 hours. Lipid Profile: No results for input(s): CHOL, HDL, LDLCALC, TRIG, CHOLHDL, LDLDIRECT in the last 72 hours. Thyroid Function Tests: No results for input(s): TSH, T4TOTAL, FREET4, T3FREE, THYROIDAB in the last 72 hours. Anemia Panel: No results for input(s): VITAMINB12, FOLATE, FERRITIN, TIBC, IRON, RETICCTPCT in the last 72 hours. Sepsis Labs: Recent Labs  Lab 09/30/19 0445  LATICACIDVEN 1.0    Recent Results (from the past 240 hour(s))  SARS Coronavirus 2 by RT PCR (hospital order, performed in [facility] hospital lab) Nasopharyngeal  Nasopharyngeal Swab  Status: None   Collection Time: 09/30/19  7:44 AM   Specimen: Nasopharyngeal Swab  Result Value Ref Range Status   SARS Coronavirus 2 NEGATIVE NEGATIVE Final    Comment: (NOTE) SARS-CoV-2 target nucleic acids are NOT DETECTED.  The SARS-CoV-2 RNA is generally detectable in upper and lower respiratory specimens during the acute phase of infection. The lowest concentration of SARS-CoV-2 viral copies this assay can detect is 250 copies / mL. A negative result does not preclude SARS-CoV-2 infection and should not be used as the sole basis for treatment or other patient management decisions.  A negative result may occur with improper specimen collection / handling, submission of specimen other than nasopharyngeal swab, presence of viral mutation(s) within the areas targeted by this assay, and inadequate number of viral copies (<250 copies / mL). A negative result must be combined with clinical observations, patient history, and epidemiological information.  Fact Sheet for Patients:   StrictlyIdeas.no  Fact Sheet for Healthcare Providers: BankingDealers.co.za  This test is not yet approved or  cleared by the [place] FDA and has been authorized for detection and/or diagnosis of SARS-CoV-2 by FDA under an Emergency Use Authorization (EUA).  This EUA will remain in effect (meaning this test can be used) for the duration of the COVID-19 declaration under Section 564(b)(1) of the Act, 21 U.S.C. section 360bbb-3(b)(1), unless the authorization is terminated or revoked sooner.  Performed at [facility], [place] [address] [place], [place] [address]   Aerobic/Anaerobic Culture (surgical/deep wound)     Status: None (Preliminary result)   Collection Time: 09/30/19 12:26 PM   Specimen: Abdomen; Abscess  Result Value Ref Range Status   Specimen Description   Final    ABDOMEN ABSCESS Performed at  [place] [address] [place], [place] [address]    Special Requests   Final    NONE Performed at [facility], [place] [address], [place], [place] [address]    Gram Stain   Final    ABUNDANT WBC PRESENT,BOTH PMN AND MONONUCLEAR NO ORGANISMS SEEN Performed at [facility], [place] [address] [place], [place] [address]    Culture   Final    CULTURE REINCUBATED FOR BETTER GROWTH NO ANAEROBES ISOLATED; CULTURE IN PROGRESS FOR 5 DAYS    Report Status PENDING  Incomplete      Radiology Studies: No results found.   LOS: 4 days   Antonieta Pert, MD [facility]  10/04/2019, 12:01 PM

## 2019-10-05 MED ORDER — SODIUM CHLORIDE 0.9 % IV SOLN
2.0000 g | INTRAVENOUS | Status: DC
  Administered 2019-10-05: 15:00:00 2 g via INTRAVENOUS
  Filled 2019-10-05 (×2): qty 20

## 2019-10-05 MED ORDER — METRONIDAZOLE IN NACL 5-0.79 MG/ML-% IV SOLN
500.0000 mg | Freq: Three times a day (TID) | INTRAVENOUS | Status: DC
  Administered 2019-10-05 – 2019-10-06 (×3): 500 mg via INTRAVENOUS
  Filled 2019-10-05 (×3): qty 100

## 2019-10-05 NOTE — Progress Notes (Signed)
PROGRESS NOTE    Isaac Doyle  YHC:[id number] DOB: 07-Sep-1955 DOA: 09/30/2019 PCP: System, Pcp Not In   Chief Complaint  Patient presents with  . Abdominal Pain   Brief Narrative: As per HPI: 64 y.o. male with medical history significant of hypertension, pancreatic cancer post common bile duct stent placement who was recently discharged following admission for severe sepsis with cholangitis, discharged with oral Augmentin on 09/28/2019, admitted 8/27  With worsening of upper quadrant abdominal pain with a developing "knot" in the upper abdomen, in ED WBC 13.3 K,CT abdomen pelvis with findings notable for a 20 x 6 x 15 cm peritoneal versus subcapsular organized collection along the right liver suspicious for abscess versus biloma.  General surgery was initially consulted who had recommended IR consultation.  Patient was admitted under hospitalist for further  Management. 8/27: Image guided right upper quadrant drain placement, FELT TO BE BLIOMA. Drain culture is now growing Streptococcus constellatus  And he remains on iv antibiotics>Patient has been doing well postop, no new complaints.  Tolerating diet.  Seen by PT OT and is deconditioned and planning for a skilled nursing facility.  TOC is working on it.  Subjective: Resting comfortably no abdominal pain.  Having bowel movement.  Biliary drain is intact. No fever.  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 IR plans to keep the drain in given he is having output still. He will follow up with the drain clinic and continue on antibiotics. Check CBC in am and hopefully start augmentin tomorrow and discharge to SNF.  Hypokalemia replete  Anemia likely multifactorial from chronic disease/malignancy.  Transfused 1 unit PRBC by  oncology and hemoglobin improved to 8.3 g. Cont iron supplementation.  Follow-up outpatient  Leukocytosis WBC up to 17.3, now downtrending 12-13.5.  Noted slight bump today, repeat CBC in the morning.  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  Patient will need to follow-up with outpatient oncology.   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  Monitor outpatient.  Moderate pharyngeal-esophageal dysphagia, seen by SLP now on dysphagia 1 /pured diet with thin liquid.  GI has seen the patient and per GI "* Pharyngeal dysphagia since April. Esophageal dysmotility is a potential additional factor.No significant esophageal findings at 8/20 ERCP.  Dietary and swallowing adjustments per SLP and patient preference. Empiric treatment for possible candidasis with a course of Nystatin. No further GI evaluation at this time. Consider barium esophagram if symptoms worsen. GI signing off. Outpatient GI follow with Dr. Havery Moros if needed however"   Essential hypertension: BP is controlled on home atenolol.   Protein calorie malnutrition moderate with weight loss in the setting of cancer, consulted dietitian.  Augment diet. Has had low appetite and diarrhea and vomtting and has lost weight since being on chemo. SLP,dietitian.  Recent admission 8/2--8/25 for severe sepsis/acute cholangitis possible acute cholecystitis, AKI, metabolic acidosis dyspnea  DVT prophylaxis: enoxaparin (LOVENOX) injection 40 mg Start: 09/30/19 0845 Code Status:   Code Status: DNR  Family Communication: plan of care discussed with patient at bedside. I updated his daughter and is in agreement abt SNF.  Status is: Inpatient  Remains  inpatient appropriate because:IV treatments appropriate due to intensity  of illness or inability to take PO and Inpatient level of care appropriate due to severity of illness  Dispo: The patient is from: Home with daughter              Anticipated d/c is to: SNF              Anticipated d/c date is: 1 DAY              Patient currently is medically stable for discharge.  Pending culture sensitivity ON BILIARY CULTURE.  Nutrition: Diet Order            DIET - DYS 1 Room service appropriate? Yes; Fluid consistency: Thin  Diet effective now               Body mass index is 21.34 kg/m.  Consultants:see note  Procedures:see note Microbiology:see note Blood Culture    Component Value Date/Time   SDES  09/30/2019 1226    ABDOMEN ABSCESS Performed at [facility], [place] [address] [place], [place] [address]    SPECREQUEST  09/30/2019 1226    NONE Performed at [facility], [place] [address], [place],  [address]    CULT  09/30/2019 1226    RARE STREPTOCOCCUS CONSTELLATUS SUSCEPTIBILITIES TO FOLLOW NO ANAEROBES ISOLATED; CULTURE IN PROGRESS FOR 5 DAYS    REPTSTATUS PENDING 09/30/2019 1226    Other culture-see note  Medications: Scheduled Meds: . (feeding supplement) PROSource Plus  30 mL Oral BID BM  . sodium chloride   Intravenous Once  . atenolol  100 mg Oral Daily  . Chlorhexidine Gluconate Cloth  6 each Topical Daily  . enoxaparin (LOVENOX) injection  40 mg Subcutaneous Q24H  . feeding supplement  1 Container Oral TID BM  . ferrous sulfate  325 mg Oral BID WC  . nystatin  5 mL Oral QID  . pantoprazole  40 mg Oral Daily  . sodium chloride flush  10-40 mL Intracatheter Q12H   Continuous Infusions: . sodium chloride 75 mL/hr at 10/04/19 2141  . sodium chloride 250 mL (10/03/19 0936)  . cefTRIAXone (ROCEPHIN)  IV    . metronidazole      Antimicrobials: Anti-infectives (From admission, onward)   Start     Dose/Rate Route Frequency Ordered Stop   10/05/19 1400  cefTRIAXone (ROCEPHIN) 2 g in  sodium chloride 0.9 % 100 mL IVPB        2 g 200 mL/hr over 30 Minutes Intravenous Every 24 hours 10/05/19 1122     10/05/19 1400  metroNIDAZOLE (FLAGYL) IVPB 500 mg        500 mg 100 mL/hr over 60 Minutes Intravenous Every 8 hours 10/05/19 1122     09/30/19 1500  piperacillin-tazobactam (ZOSYN) IVPB 3.375 g  Status:  Discontinued        3.375 g 12.5 mL/hr over 240 Minutes Intravenous Every 8 hours 09/30/19 0848 10/05/19 1119   09/30/19 0845  piperacillin-tazobactam (ZOSYN) IVPB 3.375 g        3.375 g 100 mL/hr over 30 Minutes Intravenous  Once 09/30/19 0843 09/30/19 1225       Objective: Vitals: Today's Vitals   10/04/19 2036 10/04/19 2121 10/04/19 2249 10/05/19 0635  BP:  115/73  126/82  Pulse:  78  73  Resp:  18  18  Temp:  98.4 F (36.9 C)  97.7 F (36.5 C)  TempSrc:  Oral  Oral  SpO2:  100%  100%  Weight:      Height:      PainSc: 0-No pain  Asleep     Intake/Output Summary (Last 24 hours) at 10/05/2019 1146 Last data filed at 10/05/2019 0907 Gross per 24 hour  Intake 2677.99 ml  Output 1880 ml  Net 797.99 ml   Filed Weights   09/30/19 0431  Weight: 69.4 kg   Weight change:    Intake/Output from previous day: 08/31 0701 - 09/01 0700 In: 3015.4 [P.O.:1080; I.V.:1784.7; IV Piggyback:150.7] Out: 1995 [Urine:1700; Drains:295] Intake/Output this shift: Total I/O In: 240 [P.O.:240] Out: 175 [Urine:175]  Examination: General exam:AAOx3, older for age,elderly,NAD, weak appearing. HEENT:Oral mucosa moist, Ear/Nose WNL grossly, dentition normal. Respiratory system: bilaterally clear,no wheezing or crackles,no use of accessory muscle Cardiovascular system: S1 & S2 +, No JVD,. Gastrointestinal system: Abdomen soft, right upper quadrant biliary drain is intact NT,ND, BS+ Nervous System:Alert, awake, moving extremities and grossly nonfocal Extremities: No edema, distal peripheral pulses palpable.  Skin: No rashes,no icterus. MSK: Normal muscle bulk,tone,  power  Data Reviewed: I have personally reviewed following labs and imaging studies CBC: Recent Labs  Lab 09/30/19 0445 09/30/19 0445 09/30/19 1328 10/01/19 0709 10/02/19 0318 10/03/19 0615 10/04/19 0438  WBC 13.3*   < > 10.3 16.6* 17.3* 12.5* 13.5*  NEUTROABS 10.8*  --   --   --   --   --   --   HGB 8.3*   < > 7.6* 8.4* 7.9* 7.3* 8.3*  HCT 26.3*   < > 24.1* 27.4* 25.5* 23.5* 26.3*  MCV 83.2   < > 84.0 84.6 83.3 84.5 84.6  PLT 137*   < > 135* 172 183 174 205   < > = values in this interval not displayed.   Basic Metabolic Panel: Recent Labs  Lab 09/30/19 0445 09/30/19 0445 09/30/19 1328 10/01/19 0709 10/02/19 0318 10/03/19 0615 10/04/19 0438  NA 140  --   --  136 135 136 134*  K 3.0*  --   --  3.3* 3.3* 3.1* 3.2*  CL 104  --   --  104 106 107 106  CO2 22  --   --  22 21* 22 19*  GLUCOSE 132*  --   --  107* 132* 140* 116*  BUN 22  --   --  17 18 15 15   CREATININE 0.94   < > 0.92 0.92 0.94 0.86 0.80  CALCIUM 8.5*  --   --  8.0* 7.6* 7.7* 7.7*   < > = values in this interval not displayed.   GFR: Estimated Creatinine Clearance: 90.4 mL/min (by C-G formula based on SCr of 0.8 mg/dL). Liver Function Tests: Recent Labs  Lab 09/30/19 0445 10/01/19 0709 10/02/19 0318 10/03/19 0615 10/04/19 0438  AST 20 18 19 22 19   ALT 15 13 15 14 13   ALKPHOS 117 97 93 105 105  BILITOT 2.1* 1.6* 1.5* 1.4* 1.4*  PROT 6.1* 5.3* 5.3* 5.2* 5.4*  ALBUMIN 2.4* 1.9* 1.8* 1.7* 1.8*   No results for input(s): LIPASE, AMYLASE in the last 168 hours. No results for input(s): AMMONIA in the last 168 hours. Coagulation Profile: Recent Labs  Lab 09/30/19 0445  INR 1.6*   Cardiac Enzymes: No results for input(s): CKTOTAL, CKMB, CKMBINDEX, TROPONINI in the last 168 hours. BNP (last 3 results) No results for input(s): PROBNP in the last 8760 hours. HbA1C: No results for input(s): HGBA1C in the last 72 hours. CBG: No results for input(s): GLUCAP in the last  168 hours. Lipid Profile: No  results for input(s): CHOL, HDL, LDLCALC, TRIG, CHOLHDL, LDLDIRECT in the last 72 hours. Thyroid Function Tests: No results for input(s): TSH, T4TOTAL, FREET4, T3FREE, THYROIDAB in the last 72 hours. Anemia Panel: No results for input(s): VITAMINB12, FOLATE, FERRITIN, TIBC, IRON, RETICCTPCT in the last 72 hours. Sepsis Labs: Recent Labs  Lab 09/30/19 0445  LATICACIDVEN 1.0    Recent Results (from the past 240 hour(s))  SARS Coronavirus 2 by RT PCR (hospital order, performed in [facility] hospital lab) Nasopharyngeal Nasopharyngeal Swab     Status: None   Collection Time: 09/30/19  7:44 AM   Specimen: Nasopharyngeal Swab  Result Value Ref Range Status   SARS Coronavirus 2 NEGATIVE NEGATIVE Final    Comment: (NOTE) SARS-CoV-2 target nucleic acids are NOT DETECTED.  The SARS-CoV-2 RNA is generally detectable in upper and lower respiratory specimens during the acute phase of infection. The lowest concentration of SARS-CoV-2 viral copies this assay can detect is 250 copies / mL. A negative result does not preclude SARS-CoV-2 infection and should not be used as the sole basis for treatment or other patient management decisions.  A negative result may occur with improper specimen collection / handling, submission of specimen other than nasopharyngeal swab, presence of viral mutation(s) within the areas targeted by this assay, and inadequate number of viral copies (<250 copies / mL). A negative result must be combined with clinical observations, patient history, and epidemiological information.  Fact Sheet for Patients:   StrictlyIdeas.no  Fact Sheet for Healthcare Providers: BankingDealers.co.za  This test is not yet approved or  cleared by the [place] FDA and has been authorized for detection and/or diagnosis of SARS-CoV-2 by FDA under an Emergency Use Authorization (EUA).  This EUA will remain in effect (meaning this test can  be used) for the duration of the COVID-19 declaration under Section 564(b)(1) of the Act, 21 U.S.C. section 360bbb-3(b)(1), unless the authorization is terminated or revoked sooner.  Performed at [facility], [place] [address], [place], [place] [address]   Aerobic/Anaerobic Culture (surgical/deep wound)     Status: None (Preliminary result)   Collection Time: 09/30/19 12:26 PM   Specimen: Abdomen; Abscess  Result Value Ref Range Status   Specimen Description   Final    ABDOMEN ABSCESS Performed at [place] [address] [place], [place] [address]    Special Requests   Final    NONE Performed at [facility], [place] [address] [place], [place] [address]    Gram Stain   Final    ABUNDANT WBC PRESENT,BOTH PMN AND MONONUCLEAR NO ORGANISMS SEEN Performed at [facility], [place] [address], [place], [place] [address]    Culture   Final    RARE STREPTOCOCCUS CONSTELLATUS SUSCEPTIBILITIES TO FOLLOW NO ANAEROBES ISOLATED; CULTURE IN PROGRESS FOR 5 DAYS    Report Status PENDING  Incomplete  SARS CORONAVIRUS 2 (TAT 6-24 HRS) Nasopharyngeal Nasopharyngeal Swab     Status: None   Collection Time: 10/04/19  4:41 PM   Specimen: Nasopharyngeal Swab  Result Value Ref Range Status   SARS Coronavirus 2 NEGATIVE NEGATIVE Final    Comment: (NOTE) SARS-CoV-2 target nucleic acids are NOT DETECTED.  The SARS-CoV-2 RNA is generally detectable in upper and lower respiratory specimens during the acute phase of infection. Negative results do not preclude SARS-CoV-2 infection, do not rule out co-infections with other pathogens, and should not be used as the sole basis for treatment  or other patient management decisions. Negative results must be combined with clinical observations, patient history, and epidemiological information. The expected result is Negative.  Fact Sheet for  Patients: SugarRoll.be  Fact Sheet for Healthcare Providers: https://www.woods-mathews.com/  This test is not yet approved or cleared by the [place] FDA and  has been authorized for detection and/or diagnosis of SARS-CoV-2 by FDA under an Emergency Use Authorization (EUA). This EUA will remain  in effect (meaning this test can be used) for the duration of the COVID-19 declaration under Se ction 564(b)(1) of the Act, 21 U.S.C. section 360bbb-3(b)(1), unless the authorization is terminated or revoked sooner.  Performed at [facility], [place] [address], [place], [place] [address]       Radiology Studies: No results found.   LOS: 5 days   Antonieta Pert, MD [facility]  10/05/2019, 11:46 AM

## 2019-10-05 NOTE — Progress Notes (Signed)
Patient and daughter would like to receive a prescription for the feeding supplement PROSource Plus liquid 30 mL packet taken 2 times daily between meals for home use on d/c to facility. Also need a letter just stating that patient is going to Rehab facility per hospital recommendation.

## 2019-10-05 NOTE — Progress Notes (Signed)
Referring Physician(s): Chiu,S  Supervising Physician: Sandi Mariscal  Patient Status:  [facility] - In-pt  Chief Complaint: Abdominal pain/fluid collection   Subjective: Patient currently without major complaints.  Does have some minimal soreness at right upper quadrant drain site; denies nausea or vomiting.   Allergies: Patient has no known allergies.  Medications: Prior to Admission medications   Medication Sig Start Date End Date Taking? Authorizing Provider  amoxicillin-clavulanate (AUGMENTIN) 875-125 MG tablet Take 1 tablet by mouth 2 (two) times daily for 10 days. 09/28/19 10/08/19 Yes Rai, Ripudeep K, MD  atenolol (TENORMIN) 100 MG tablet Take 1 tablet (100 mg total) by mouth daily. Hold until follow-up with your PCP Patient taking differently: Take 100 mg by mouth daily.  09/28/19  Yes Rai, Ripudeep K, MD  cetirizine (ZYRTEC) 10 MG tablet Take 10 mg by mouth daily.   Yes [provider]  diphenoxylate-atropine (LOMOTIL) 2.5-0.025 MG tablet Take 1-2 tablets by mouth 4 (four) times daily as needed for diarrhea or loose stools. Patient taking differently: Take 2 tablets by mouth 4 (four) times daily as needed for diarrhea or loose stools.  07/14/19  Yes Truitt Merle, MD  guaifenesin (HUMIBID E) 400 MG TABS tablet Take 1 tablet (400 mg total) by mouth every 6 (six) hours as needed. Patient taking differently: Take 400 mg by mouth in the morning and at bedtime.  05/13/19  Yes Geradine Girt, DO  hydrocortisone (ANUSOL-HC) 2.5 % rectal cream Apply 1 application topically 4 (four) times daily as needed for hemorrhoids. 09/28/19  Yes Rai, Ripudeep K, MD  lidocaine-prilocaine (EMLA) cream Apply 1 application topically as needed. Patient taking differently: Apply 1 application topically as needed (To access the port).  06/23/19  Yes Truitt Merle, MD  loperamide (IMODIUM) 2 MG capsule Take 1-2 capsules (2-4 mg total) by mouth every 6 (six) hours as needed for diarrhea or loose stools. 07/14/19   Yes Truitt Merle, MD  magnesium oxide (MAG-OX) 400 (241.3 Mg) MG tablet Take 1 tablet (400 mg total) by mouth daily. Patient taking differently: Take 400 mg by mouth every other day.  05/14/19  Yes Geradine Girt, DO  methocarbamol (ROBAXIN) 500 MG tablet Take 1 tablet (500 mg total) by mouth every 6 (six) hours as needed for muscle spasms. 09/28/19  Yes Rai, Ripudeep K, MD  ondansetron (ZOFRAN ODT) 4 MG disintegrating tablet Take 1 tablet (4 mg total) by mouth every 8 (eight) hours as needed for nausea or vomiting. 09/28/19  Yes Rai, Ripudeep K, MD  pantoprazole (PROTONIX) 40 MG tablet Take 1 tablet (40 mg total) by mouth daily. 09/28/19 01/26/20 Yes Rai, Ripudeep K, MD  potassium chloride SA (KLOR-CON) 20 MEQ tablet Take 2 tablets (40 mEq total) by mouth daily. 08/30/19  Yes Truitt Merle, MD  traMADol (ULTRAM) 50 MG tablet Take 1 tablet (50 mg total) by mouth every 8 (eight) hours as needed for moderate pain or severe pain. 09/28/19 09/27/20 Yes Rai, Ripudeep Raliegh Ip, MD     Vital Signs: BP 126/82 (BP Location: Left Arm)   Pulse 73   Temp 97.7 F (36.5 C) (Oral)   Resp 18   Ht 5\' 11"  (1.803 m)   Wt 153 lb (69.4 kg)   SpO2 100%   BMI 21.34 kg/m   Physical Exam awake, alert.  Right abdominal drain intact, insertion site okay, mildly tender to palpation, output yesterday 295 cc turbid yellow fluid, 20 cc fluid in JP at this time; drain irrigated without difficulty  Imaging: No results found.  Labs:  CBC: Recent Labs    10/01/19 0709 10/02/19 0318 10/03/19 0615 10/04/19 0438  WBC 16.6* 17.3* 12.5* 13.5*  HGB 8.4* 7.9* 7.3* 8.3*  HCT 27.4* 25.5* 23.5* 26.3*  PLT 172 183 174 205    COAGS: Recent Labs    06/03/19 0146 09/23/19 0930 09/24/19 0418 09/30/19 0445  INR 1.0 1.4* 1.7* 1.6*    BMP: Recent Labs    10/01/19 0709 10/02/19 0318 10/03/19 0615 10/04/19 0438  NA 136 135 136 134*  K 3.3* 3.3* 3.1* 3.2*  CL 104 106 107 106  CO2 22 21* 22 19*  GLUCOSE 107* 132* 140* 116*  BUN  17 18 15 15   CALCIUM 8.0* 7.6* 7.7* 7.7*  CREATININE 0.92 0.94 0.86 0.80  GFRNONAA >60 >60 >60 >60  GFRAA >60 >60 >60 >60    LIVER FUNCTION TESTS: Recent Labs    10/01/19 0709 10/02/19 0318 10/03/19 0615 10/04/19 0438  BILITOT 1.6* 1.5* 1.4* 1.4*  AST 18 19 22 19   ALT 13 15 14 13   ALKPHOS 97 93 105 105  PROT 5.3* 5.3* 5.2* 5.4*  ALBUMIN 1.9* 1.8* 1.7* 1.8*    Assessment and Plan: 64 yo male with history of pancreatic carcinoma with previous biliary stent placement in April of this year.  He was recently admitted to [facility] secondary to sepsis/cholangitis and underwent biliary stent exchange on 09/24/2019.  He was discharged home on 8/25. Recently presented to [facility] ED with persistent abdominal pain.  CT of the abdomen pelvis 8/27  revealed:  1. 20 x 6 x 15 cm peritoneal or subcapsular organized collection along the right liver with a few bubbles of gas. The collection has developed rapidly and is likely an abscess or biloma. 2. Moderate ascites in the lower abdomen with smooth peritoneal thickening in the pelvis, new from prior and attributed to peritonitis rather than malignancy. 3. Recently replaced biliary stent which appears patent with diffuse gas filling. 4. Cholelithiasis. Improved but persistent inflammatory changes at the gallbladder wall. 5. 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  Fluid bilirubin pending; drain fluid cultures with rare Streptococcus constellatus; continue current treatment for now; outpatient drain clinic follow-up order has been placed; as OP the right upper quadrant drain will need to be flushed once daily with 5 cc sterile saline, record output and change overlying gauze dressing daily.Nursing aware.    Electronically Signed: D. Rowe Samrat, PA-C 10/05/2019, 12:33 PM   I spent a total of 15 minutes at the the patient's bedside AND on the  patient's hospital floor or unit, greater than 50% of which was counseling/coordinating care for right upper abdominal fluid collection drain    Patient ID: Isaac Doyle, male   DOB: [birth year], 64 y.o.   MRN: [id number]

## 2019-10-05 NOTE — Progress Notes (Signed)
Occupational Therapy Treatment Patient Details Name: Isaac Doyle MRN: [id number] DOB: August 27, 1955 Today's Date: 10/05/2019    History of present illness As per HPI: 64 y.o. male with medical history significant of hypertension, pancreatic cancer post common bile duct stent placement who was recently discharged following admission for severe sepsis with cholangitis, Readmitted with abdominal pain and S/p drain placement by IR right upper quadrant, felt to be blioma   OT comments  Treatment focused on improving functional mobility, safety and performing higher level ADLs. Patient ambulated to bathroom, performed toilet transfer with use of grab bars and stood at sink for grooming. Min guard provided throughout. Supervision for transfer out of bed. Patient ambulated approx 30 feet in room with Rw and min guard. Continue to recommend short term rehab at discharge.   Follow Up Recommendations  SNF;Home health OT;Supervision/Assistance - 24 hour    Equipment Recommendations  3 in 1 bedside commode    Recommendations for Other Services      Precautions / Restrictions Precautions Precautions: Fall Precaution Comments: R side drain Restrictions Weight Bearing Restrictions: No       Mobility Bed Mobility Overal bed mobility: Needs Assistance Bed Mobility: Supine to Sit     Supine to sit: HOB elevated;Supervision     General bed mobility comments: increased time.  Transfers Overall transfer level: Needs assistance Equipment used: Rolling walker (2 wheeled)   Sit to Stand: From elevated surface;Min guard         General transfer comment: Min guard to stand using RW from elevated bed. Patient ambulated to bathroom with RW & min guard.    Balance Overall balance assessment: Needs assistance Sitting-balance support: No upper extremity supported;Feet supported Sitting balance-Leahy Scale: Fair     Standing balance support: During functional activity Standing balance-Leahy  Scale: Fair Standing balance comment: can take upper extremities off of walker to performg rooming without leaning on sink.                           ADL either performed or assessed with clinical judgement   ADL       Grooming: Standing;Wash/dry hands Grooming Details (indicate cue type and reason): Patient stood at sink to wash hands.                 Toilet Transfer: Min Paramedic Details (indicate cue type and reason): min guard to ambulate to bathroom and perform toilet transfer. Patient used grab bars for sit and stand from toilet height.                 Vision Patient Visual Report: No change from baseline     Perception     Praxis      Cognition Arousal/Alertness: Awake/alert Behavior During Therapy: WFL for tasks assessed/performed Overall Cognitive Status: Within Functional Limits for tasks assessed                                          Exercises     Shoulder Instructions       General Comments      Pertinent Vitals/ Pain       Pain Assessment: Faces Faces Pain Scale: Hurts a little bit Pain Location: stomach Pain Intervention(s): Monitored during session  Home Living  Prior Functioning/Environment              Frequency  Min 2X/week        Progress Toward Goals  OT Goals(current goals can now be found in the care plan section)  Progress towards OT goals: Progressing toward goals  Acute Rehab OT Goals Patient Stated Goal: to get back to [facility] OT Goal Formulation: With patient Time For Goal Achievement: 10/15/19 Potential to Achieve Goals: Good  Plan Discharge plan remains appropriate    Co-evaluation                 AM-PAC OT "6 Clicks" Daily Activity     Outcome Measure   Help from another person eating meals?: None Help from another person taking care of personal grooming?:  None Help from another person toileting, which includes using toliet, bedpan, or urinal?: A Little Help from another person bathing (including washing, rinsing, drying)?: A Little Help from another person to put on and taking off regular upper body clothing?: A Little Help from another person to put on and taking off regular lower body clothing?: A Little 6 Click Score: 20    End of Session Equipment Utilized During Treatment: Rolling walker;Gait belt  OT Visit Diagnosis: Unsteadiness on feet (R26.81);Muscle weakness (generalized) (M62.81);Repeated falls (R29.6)   Activity Tolerance Patient tolerated treatment well   Patient Left in chair;with call bell/phone within reach;with chair alarm set   Nurse Communication  (okay to see pr nursing)        Time: 3785-8850 OT Time Calculation (min): 18 min  Charges: OT General Charges $OT Visit: 1 Visit OT Treatments $Self Care/Home Management : 8-22 mins  Derl Barrow, OTR/L [place]  Office [phone] Pager: [place] 10/05/2019, 4:44 PM

## 2019-10-05 NOTE — TOC Transition Note (Addendum)
Transition of Care [facility] - CM/SW Discharge Note   Patient Details  Name: Isaac Doyle MRN: [id number] Date of Birth: [birth year]  Transition of Care [facility]) CM/SW Contact:  Lia Hopping, [place] Phone Number: 10/05/2019, 10:11 AM   Clinical Narrative:    [facility] waiver in place. Ref# [id number]. Requested clinical information faxed.  Patient and his daughter notified [facility] ready to accept the patient today.  Patient will transport by [facility].   Update: Patient not medically ready to transfer to today.    Final next level of care: Skilled Nursing Facility Barriers to Discharge: Barriers Resolved   Patient Goals and CMS Choice Patient states their goals for this hospitalization and ongoing recovery are:: I will go to rehab CMS Medicare.gov Compare Post Acute Care list provided to:: Patient Choice offered to / list presented to : Patient, Adult Children  Discharge Placement PASRR number recieved: 10/05/19            Patient chooses bed at: [facility] Patient to be transferred to facility by: [place] Name of family member notified: Milagros Evener Daughter [phone] Patient and family notified of of transfer: 10/05/19  Discharge Plan and Services   Discharge Planning Services: CM Consult                      HH Arranged: NA [place] Agency: NA        Social Determinants of Health (SDOH) Interventions     Readmission Risk Interventions No flowsheet data found.

## 2019-10-06 LAB — AEROBIC/ANAEROBIC CULTURE W GRAM STAIN (SURGICAL/DEEP WOUND)

## 2019-10-06 LAB — COMPREHENSIVE METABOLIC PANEL
ALT: 15 U/L (ref 0–44)
AST: 22 U/L (ref 15–41)
Albumin: 1.7 g/dL — ABNORMAL LOW (ref 3.5–5.0)
Alkaline Phosphatase: 96 U/L (ref 38–126)
Anion gap: 9 (ref 5–15)
BUN: 12 mg/dL (ref 8–23)
CO2: 19 mmol/L — ABNORMAL LOW (ref 22–32)
Calcium: 7.6 mg/dL — ABNORMAL LOW (ref 8.9–10.3)
Chloride: 106 mmol/L (ref 98–111)
Creatinine, Ser: 0.6 mg/dL — ABNORMAL LOW (ref 0.61–1.24)
GFR calc Af Amer: >60 mL/min (ref >60)
GFR calc non Af Amer: >60 mL/min (ref >60)
Glucose, Bld: 100 mg/dL — ABNORMAL HIGH (ref 70–99)
Potassium: 3.1 mmol/L — ABNORMAL LOW (ref 3.5–5.1)
Sodium: 134 mmol/L — ABNORMAL LOW (ref 135–145)
Total Bilirubin: 0.9 mg/dL (ref 0.3–1.2)
Total Protein: 5.4 g/dL — ABNORMAL LOW (ref 6.5–8.1)

## 2019-10-06 LAB — CBC
HCT: 24.5 % — ABNORMAL LOW (ref 39.0–52.0)
Hemoglobin: 7.7 g/dL — ABNORMAL LOW (ref 13.0–17.0)
MCH: 26.9 pg (ref 26.0–34.0)
MCHC: 31.4 g/dL (ref 30.0–36.0)
MCV: 85.7 fL (ref 80.0–100.0)
Platelets: 260 10*3/uL (ref 150–400)
RBC: 2.86 MIL/uL — ABNORMAL LOW (ref 4.22–5.81)
RDW: 16.6 % — ABNORMAL HIGH (ref 11.5–15.5)
WBC: 11.1 10*3/uL — ABNORMAL HIGH (ref 4.0–10.5)
nRBC: 0 % (ref 0.0–0.2)

## 2019-10-06 MED ORDER — POTASSIUM CHLORIDE CRYS ER 20 MEQ PO TBCR
40.0000 meq | EXTENDED_RELEASE_TABLET | Freq: Once | ORAL | Status: AC
  Administered 2019-10-06: 40 meq via ORAL
  Filled 2019-10-06: qty 2

## 2019-10-06 MED ORDER — POTASSIUM CHLORIDE CRYS ER 20 MEQ PO TBCR
20.0000 meq | EXTENDED_RELEASE_TABLET | Freq: Once | ORAL | Status: AC
  Administered 2019-10-06: 10:00:00 20 meq via ORAL
  Filled 2019-10-06: qty 1

## 2019-10-06 MED ORDER — FERROUS SULFATE 325 (65 FE) MG PO TABS: 325.0000 mg | ORAL_TABLET | Freq: Two times a day (BID) | ORAL | 3 refills | Status: DC

## 2019-10-06 MED ORDER — HEPARIN SOD (PORK) LOCK FLUSH 100 UNIT/ML IV SOLN
500.0000 [IU] | INTRAVENOUS | Status: AC | PRN
  Administered 2019-10-06: 500 [IU]
  Filled 2019-10-06: qty 5

## 2019-10-06 MED ORDER — AMOXICILLIN-POT CLAVULANATE 875-125 MG PO TABS
1.0000 | ORAL_TABLET | Freq: Two times a day (BID) | ORAL | Status: AC
  Administered 2019-10-06: 11:00:00 1 via ORAL
  Filled 2019-10-06: qty 1

## 2019-10-06 MED ORDER — AMOXICILLIN-POT CLAVULANATE 875-125 MG PO TABS: 1.0000 | ORAL_TABLET | Freq: Two times a day (BID) | ORAL | 0 refills | Status: DC

## 2019-10-06 MED ORDER — PROSOURCE PLUS PO LIQD: 30.0000 mL | Freq: Two times a day (BID) | ORAL | 0 refills | Status: AC

## 2019-10-06 MED ORDER — TRAMADOL HCL 50 MG PO TABS: 50.0000 mg | ORAL_TABLET | Freq: Three times a day (TID) | ORAL | 0 refills | Status: DC | PRN

## 2019-10-06 NOTE — Progress Notes (Signed)
[employer].

## 2019-10-06 NOTE — Discharge Summary (Signed)
Physician Discharge Summary  Cuyler Vandyken ERX:[id number] DOB: 1956-01-31 DOA: 09/30/2019  PCP: System, Pcp Not In  Admit date: 09/30/2019 Discharge date: 10/06/2019  Admitted From: home Disposition:  SNF  Recommendations for Outpatient Follow-up:  1. Follow up with PCP in 1-2 weeks 2. Follow up with ID clinic in 1-2 wk 3. Please obtain BMP/CBC in one week 4. Please follow up on the following pending results:  Home Health:NO  Equipment/Devices: NONE  Discharge Condition: Stable Code Status:   Code Status: DNR Diet recommendation:  Diet Order            DIET - DYS 1 Room service appropriate? Yes; Fluid consistency: Thin  Diet effective now                  Brief/Interim Summary: 64 y.o.malewith medical history significant ofhypertension, pancreatic cancer post common bile duct stent placement who was recently discharged following admission for severe sepsis with cholangitis, discharged with oral Augmentin on 09/28/2019, admitted 8/27  With worsening of upper quadrant abdominal pain with a developing "knot"in the upper abdomen, in ED WBC 13.3 K,CT abdomen pelvis with findings notable for a 20 x 6 x 15 cm peritoneal versus subcapsular organized collection along the right liver suspicious for abscess versus biloma. General surgery was initially consulted who had recommended IR consultation.  Patient was admitted under hospitalist for further  Management. 8/27: Image guided right upper quadrant drain placement, FELT TO BE BLIOMA. Drain culture is now growing Streptococcus constellatus-sensitive to ampicillin and will be discharged on Augmentin as per ID recommendation will need to continue until the collection is resolved>Patient has been doing well postop, no new complaints.  Tolerating diet.  Seen by PT OT and is deconditioned and is going to skilled nursing facility.  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 advise ID follow up in clinic. He will follow up with the drain clinic. Wbc is improved to 11.1 check  In 1 wk I informed oncology team Coy Saunas who will follow up on his antibiotics when the ysee him in clnic.  Hypokalemia replete W/ DAILY KCL  Anemia likely multifactorial from chronic disease/malignancy.  Transfused 1 unit PRBC by oncology and hemoglobin improved to 8.3 g>> 7.7 gm- repeat cbc in 1 wk at SNF. Cont iron supplementation.  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  Patient will need to follow-up with outpatient oncology.   CBD obstruction history with recent stent placement-LFTs has improved with normal AST ALT and total bili   Moderate pharyngeal-esophageal dysphagia, seen by SLP now on dysphagia 1 /pured diet with thin liquid.  GI has seen the patient and per GI "*Pharyngeal dysphagia since April. Esophageal dysmotility is a potential additional factor.No significant esophageal findings at 8/20 ERCP. Dietary and swallowing adjustments per SLP and patient preference.Empiric treatment for possible candidasiswith a course of Nystatin. No further GI  evaluation at this time. Consider barium esophagram if symptoms worsen.GI signing off. Outpatient GI follow with Dr. Havery Moros if needed however"   Essential  hypertension: BP is controlled on home atenolol.   Protein calorie malnutrition moderate with weight loss in the setting of cancer, consulted dietitian.  Augment diet. Has had low appetite and diarrhea and vomtting and has lost weight since being on chemo. SLP,dietitian.  Recent admission 8/2--8/25 for severe sepsis/acute cholangitis possible acute cholecystitis, AKI, metabolic acidosis dyspnea  Consults:  Id DISCUSSED OVER THE PHONE  IR  ONCOLOGY  Subjective: Doing well, resting, no nausea vomiting or abd pain, drain output improving was 125 ml/past 24 hrs  from 295 ml previous day  Discharge Exam: Vitals:   10/05/19 2213 10/06/19 0541  BP: 117/77 128/76  Pulse: 72 72  Resp: 18 17  Temp: 99.3 F (37.4 C) 99.2 F (37.3 C)  SpO2: 100% 100%   General: Pt is alert, awake, not in acute distress Cardiovascular: RRR, S1/S2 +, no rubs, no gallops Respiratory: CTA bilaterally, no wheezing, no rhonchi Abdominal: Soft, NT, ND, bowel sounds + Extremities: no edema, no cyanosis  Discharge Instructions  Discharge Instructions    Discharge instructions   Complete by: As directed    Follow-up with the drain clinic. Follow-up with your infectious disease doctor. Cbc, bmp in 5-7 days at SNF  Please call call MD or return to ER for similar or worsening recurring problem that brought you to hospital or if any fever,nausea/vomiting,abdominal pain, uncontrolled pain, chest pain,  shortness of breath or any other alarming symptoms.  Please follow-up your doctor as instructed in a week time and call the office for appointment.  Please avoid alcohol, smoking, or any other illicit substance and maintain healthy habits including taking your regular medications as prescribed.  You were cared for by a hospitalist during your hospital stay. If you have any questions about your discharge medications or the care you received while you were in the hospital after you are discharged, you can call  the unit and ask to speak with the hospitalist on call if the hospitalist that took care of you is not available.  Once you are discharged, your primary care physician will handle any further medical issues. Please note that NO REFILLS for any discharge medications will be authorized once you are discharged, as it is imperative that you return to your primary care physician (or establish a relationship with a primary care physician if you do not have one) for your aftercare needs so that they can reassess your need for medications and monitor your lab values   Increase activity slowly   Complete by: As directed      Allergies as of 10/06/2019   No Known Allergies     Medication List    TAKE these medications   (feeding supplement) PROSource Plus liquid Take 30 mLs by mouth 2 (two) times daily between meals.   amoxicillin-clavulanate 875-125 MG tablet Commonly known as: Augmentin Take 1 tablet by mouth 2 (two) times daily. Notes to patient: Treats infection Take with food   atenolol 100 MG tablet Commonly known as: TENORMIN Take 1 tablet (100 mg total) by mouth daily. Hold until follow-up with your PCP What changed: additional instructions   cetirizine 10 MG tablet Commonly known as: ZYRTEC Take 10 mg by mouth daily.   diphenoxylate-atropine 2.5-0.025 MG tablet Commonly known as: LOMOTIL Take 1-2 tablets by mouth 4 (four) times daily as needed for diarrhea or loose stools. What  changed: how much to take   ferrous sulfate 325 (65 FE) MG tablet Take 1 tablet (325 mg total) by mouth 2 (two) times daily with a meal.   guaifenesin 400 MG Tabs tablet Commonly known as: HUMIBID E Take 1 tablet (400 mg total) by mouth every 6 (six) hours as needed. What changed: when to take this   hydrocortisone 2.5 % rectal cream Commonly known as: ANUSOL-HC Apply 1 application topically 4 (four) times daily as needed for hemorrhoids.   lidocaine-prilocaine cream Commonly known as:  EMLA Apply 1 application topically as needed. What changed: reasons to take this   loperamide 2 MG capsule Commonly known as: IMODIUM Take 1-2 capsules (2-4 mg total) by mouth every 6 (six) hours as needed for diarrhea or loose stools.   magnesium oxide 400 (241.3 Mg) MG tablet Commonly known as: MAG-OX Take 1 tablet (400 mg total) by mouth daily. What changed: when to take this   methocarbamol 500 MG tablet Commonly known as: Robaxin Take 1 tablet (500 mg total) by mouth every 6 (six) hours as needed for muscle spasms.   ondansetron 4 MG disintegrating tablet Commonly known as: Zofran ODT Take 1 tablet (4 mg total) by mouth every 8 (eight) hours as needed for nausea or vomiting.   pantoprazole 40 MG tablet Commonly known as: PROTONIX Take 1 tablet (40 mg total) by mouth daily.   potassium chloride SA 20 MEQ tablet Commonly known as: KLOR-CON Take 2 tablets (40 mEq total) by mouth daily.   traMADol 50 MG tablet Commonly known as: Ultram Take 1 tablet (50 mg total) by mouth every 8 (eight) hours as needed for up to 4 doses for moderate pain or severe pain.       Follow-up [place]. Call in 3 day(s).   Why: [facility] follow up Contact information: [place] [address] [phone]        Rosiland Oz, MD Follow up in 2 week(s).   Specialty: Infectious Diseases Contact information: [place] [address] [phone]              No Known Allergies  The results of significant diagnostics from this hospitalization (including imaging, microbiology, ancillary and laboratory) are listed below for reference.    Microbiology: Recent Results (from the past 240 hour(s))  SARS Coronavirus 2 by RT PCR (hospital order, performed in [facility] hospital lab) Nasopharyngeal Nasopharyngeal Swab     Status: None   Collection Time: 09/30/19  7:44 AM   Specimen:  Nasopharyngeal Swab  Result Value Ref Range Status   SARS Coronavirus 2 NEGATIVE NEGATIVE Final    Comment: (NOTE) SARS-CoV-2 target nucleic acids are NOT DETECTED.  The SARS-CoV-2 RNA is generally detectable in upper and lower respiratory specimens during the acute phase of infection. The lowest concentration of SARS-CoV-2 viral copies this assay can detect is 250 copies / mL. A negative result does not preclude SARS-CoV-2 infection and should not be used as the sole basis for treatment or other patient management decisions.  A negative result may occur with improper specimen collection / handling, submission of specimen other than nasopharyngeal swab, presence of viral mutation(s) within the areas targeted by this assay, and inadequate number of viral copies (<250 copies / mL). A negative result must be combined with clinical observations, patient history, and epidemiological information.  Fact Sheet for Patients:   StrictlyIdeas.no  Fact Sheet for Healthcare  Providers: BankingDealers.co.za  This test is not yet approved or  cleared by the [place] and has been authorized for detection and/or diagnosis of SARS-CoV-2 by FDA under an Emergency Use Authorization (EUA).  This EUA will remain in effect (meaning this test can be used) for the duration of the COVID-19 declaration under Section 564(b)(1) of the Act, 21 U.S.C. section 360bbb-3(b)(1), unless the authorization is terminated or revoked sooner.  Performed at [facility], [place] [address], [place], [place] [address]   Aerobic/Anaerobic Culture (surgical/deep wound)     Status: None (Preliminary result)   Collection Time: 09/30/19 12:26 PM   Specimen: Abdomen; Abscess  Result Value Ref Range Status   Specimen Description ABDOMEN ABSCESS  Final   Special Requests NONE  Final   Gram Stain   Final    ABUNDANT WBC PRESENT,BOTH PMN AND MONONUCLEAR NO  ORGANISMS SEEN    Culture   Final    RARE STREPTOCOCCUS CONSTELLATUS NO ANAEROBES ISOLATED; CULTURE IN PROGRESS FOR 5 DAYS    Report Status PENDING  Incomplete   Organism ID, Bacteria STREPTOCOCCUS CONSTELLATUS  Final      Susceptibility   Streptococcus constellatus - MIC*    PENICILLIN INTERMEDIATE Intermediate     CEFTRIAXONE 1 SENSITIVE Sensitive     ERYTHROMYCIN <=0.12 SENSITIVE Sensitive     LEVOFLOXACIN 0.5 SENSITIVE Sensitive     VANCOMYCIN 0.5 SENSITIVE Sensitive     AMPICILLIN Value in next row Sensitive      SENSITIVEMIC =0.25 RELEASE PER DR REQUESTPerformed at [facility] [address], [place], [place] [address]    * RARE STREPTOCOCCUS CONSTELLATUS  SARS CORONAVIRUS 2 (TAT 6-24 HRS) Nasopharyngeal Nasopharyngeal Swab     Status: None   Collection Time: 10/04/19  4:41 PM   Specimen: Nasopharyngeal Swab  Result Value Ref Range Status   SARS Coronavirus 2 NEGATIVE NEGATIVE Final    Comment: (NOTE) SARS-CoV-2 target nucleic acids are NOT DETECTED.  The SARS-CoV-2 RNA is generally detectable in upper and lower respiratory specimens during the acute phase of infection. Negative results do not preclude SARS-CoV-2 infection, do not rule out co-infections with other pathogens, and should not be used as the sole basis for treatment or other patient management decisions. Negative results must be combined with clinical observations, patient history, and epidemiological information. The expected result is Negative.  Fact Sheet for Patients: SugarRoll.be  Fact Sheet for Healthcare Providers: https://www.woods-mathews.com/  This test is not yet approved or cleared by the [place] FDA and  has been authorized for detection and/or diagnosis of SARS-CoV-2 by FDA under an Emergency Use Authorization (EUA). This EUA will remain  in effect (meaning this test can be used) for the duration of the COVID-19 declaration under Se  ction 564(b)(1) of the Act, 21 U.S.C. section 360bbb-3(b)(1), unless the authorization is terminated or revoked sooner.  Performed at [facility], [place] [address] [place], [place] [address]     Procedures/Studies: CT ABDOMEN PELVIS WO CONTRAST  Result Date: 09/23/2019 CLINICAL DATA:  Nausea and vomiting. Weakness and hypertension for 1 week. History of pancreatic cancer. EXAM: CT ABDOMEN AND PELVIS WITHOUT CONTRAST TECHNIQUE: Multidetector CT imaging of the abdomen and pelvis was performed following the standard protocol without IV contrast. COMPARISON:  08/10/2019 MRI.  CT of 05/13/2019 FINDINGS: Lower chest: Clear lung bases. Normal heart size without pericardial or pleural effusion. Hepatobiliary: Limited evaluation for focal liver lesion. Common duct stent in place, with pneumobilia suggesting stent patency.  Gallstones. Mild gallbladder distension with pericholecystic edema, including on 34/3. Pancreas: The pancreatic primary is poorly evaluated on this noncontrast exam. No gross pancreatic duct dilatation. No convincing evidence of pancreatitis. Spleen: Normal in size, without focal abnormality. Adrenals/Urinary Tract: Normal adrenal glands. No renal calculi or hydronephrosis. 2.0 cm interpolar left renal low-density lesion is likely a cyst. Mild right renal cortical thinning. No renal calculi or hydronephrosis. No bladder calculi. Stomach/Bowel: Normal stomach, without wall thickening. Scattered colonic diverticula. Normal terminal ileum and appendix. Normal small bowel. Vascular/Lymphatic: No gross arterial encasement. No abdominopelvic adenopathy. An 11 mm upper normal portal caval node is similar to 12 mm on the prior. 21/3 today. Reproductive: Normal prostate. Other: No significant free fluid. No evidence of omental or peritoneal disease. Musculoskeletal: Prior trauma or surgery involving right iliac crest. Mild wedging of the T12 vertebral body, chronic. IMPRESSION: 1. Findings highly  suspicious for acute cholecystitis. Gallbladder distension and pericholecystic edema in the setting of gallstones. Depending on clinical concern, ultrasound could confirm. 2. Suboptimal evaluation of the pancreatic primary secondary to noncontrast technique. Common duct stent in place with pneumobilia. 3.  Aortic Atherosclerosis (ICD10-I70.0). Electronically Signed   By: Abigail Miyamoto M.D.   On: 09/23/2019 11:05   CT ABDOMEN PELVIS W CONTRAST  Result Date: 09/30/2019 CLINICAL DATA:  Abdominal distension for 4 days. Pancreas cancer with recent ERCP EXAM: CT ABDOMEN AND PELVIS WITH CONTRAST TECHNIQUE: Multidetector CT imaging of the abdomen and pelvis was performed using the standard protocol following bolus administration of intravenous contrast. CONTRAST:  159mL OMNIPAQUE IOHEXOL 300 MG/ML  SOLN COMPARISON:  Seven days ago FINDINGS: Lower chest:  Trace pleural effusions.  No acute finding Hepatobiliary: Pneumobilia in the setting of pancreatic stent, which is patent and diffusely air-filled. No liver mass is seen. Liver scalloping described below.Cholelithiasis and gallbladder distension with mild thickening and regional fat stranding, also seen previously. Pancreas: Hypodensity in loss of architecture in the midline body correlating with the history. No evidence of acute pancreatitis Spleen: Enlarged in the setting of portal venous narrowing. Adrenals/Urinary Tract: Negative adrenals. Small kidneys. No hydronephrosis. Bilateral renal cystic densities. Unremarkable bladder. Stomach/Bowel:  No obstruction. No evidence of bowel inflammation. Vascular/Lymphatic: No acute vascular finding. There is portal confluence narrowing and gastric varices. No mass or adenopathy. Reproductive:No pathologic findings. Other: Interval development of organized peritoneal collection lateral to the liver which measures 20 x 6 cm in contains a few bubbles of gas. The collection is rim enhancing. There is new ascites in the gutters and  moderate peritoneal collection in the pelvis with detectable wall thickening. These have occurred rapidly, more consistent with inflammatory collections than a manifestation of peritoneal tumor spread. Musculoskeletal: Degenerative changes without acute finding. 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 IMPRESSION: 1. 20 x 6 x 15 cm peritoneal or subcapsular organized collection along the right liver with a few bubbles of gas. The collection has developed rapidly and is likely an abscess or biloma. 2. Moderate ascites in the lower abdomen with smooth peritoneal thickening in the pelvis, new from prior and attributed to peritonitis rather than malignancy. 3. Recently replaced biliary stent which appears patent with diffuse gas filling. 4. Cholelithiasis. Improved but persistent inflammatory changes at the gallbladder wall. 5. 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 Electronically Signed   By: Monte Fantasia M.D.   On: 09/30/2019 06:19   DG CHEST PORT 1 VIEW  Result Date: 09/26/2019 CLINICAL DATA:  Shortness of breath EXAM: PORTABLE CHEST 1 VIEW COMPARISON:  09/23/2019 FINDINGS: Right Port-A-Cath remains in place, unchanged. Bibasilar airspace opacities, favor atelectasis. Small bilateral effusions suspected. Heart is normal size. No edema. No acute bony abnormality. IMPRESSION: Small bilateral pleural effusions with bibasilar opacities, likely atelectasis. Electronically Signed   By: Rolm Baptise M.D.   On: 09/26/2019 18:28   DG Chest Portable 1 View  Result Date: 09/23/2019 CLINICAL DATA:  Cough.  History of pancreatic cancer EXAM: PORTABLE CHEST 1 VIEW COMPARISON:  08/08/2019 FINDINGS: The heart size and mediastinal contours are within normal limits. Both lungs are clear. The visualized skeletal structures are unremarkable. Port-A-Cath tip in the lower SVC unchanged from the prior study. IMPRESSION: No active disease. Electronically Signed   By: Franchot Gallo  M.D.   On: 09/23/2019 12:38   DG ERCP BILIARY & PANCREATIC DUCTS  Result Date: 09/24/2019 CLINICAL DATA:  Pancreas cancer, biliary obstruction occluded stent EXAM: ERCP balloon sweep and stent exchange TECHNIQUE: Multiple spot images obtained with the fluoroscopic device and submitted for interpretation post-procedure. FLUOROSCOPY TIME:  Fluoroscopy Time:  2 minutes 29 seconds COMPARISON:  09/23/2019 CT FINDINGS: Limited ERCP demonstrates removal of the covered biliary stent. Balloon sweep performed. This was followed by placement of a new metallic self expanding biliary stent in a similar position. Final image demonstrates residual pneumobilia with decompression of the biliary tree. IMPRESSION: Limited imaging during ERCP with distal CBD stent exchanged These images were submitted for radiologic interpretation only. Please see the procedural report for the amount of contrast and the fluoroscopy time utilized. Electronically Signed   By: Jerilynn Mages.  Shick M.D.   On: 09/24/2019 11:57   CT IMAGE GUIDED DRAINAGE BY PERCUTANEOUS CATHETER  Result Date: 09/30/2019 INDICATION: 64 year old male with a history apparent biloma referred for drainage EXAM: CT GUIDED DRAINAGE OF  ABSCESS MEDICATIONS: The patient is currently admitted to the hospital and receiving intravenous antibiotics. The antibiotics were administered within an appropriate time frame prior to the initiation of the procedure. ANESTHESIA/SEDATION: 2.0 mg IV Versed 100 mcg IV Fentanyl Moderate Sedation Time:  15 minutes The patient was continuously monitored during the procedure by the interventional radiology nurse under my direct supervision. COMPLICATIONS: None TECHNIQUE: Informed written consent was obtained from the patient after a thorough discussion of the procedural risks, benefits and alternatives. All questions were addressed. Maximal Sterile Barrier Technique was utilized including caps, mask, sterile gowns, sterile gloves, sterile drape, hand hygiene  and skin antiseptic. A timeout was performed prior to the initiation of the procedure. PROCEDURE: The operative field was prepped with chlorhexidine in a sterile fashion, and a sterile drape was applied covering the operative field. A sterile gown and sterile gloves were used for the procedure. Local anesthesia was provided with 1% Lidocaine. Patient was positioned supine position on the CT gantry table. [place] CT was acquired for planning purposes. Patient is prepped and draped in the usual sterile fashion. 1% lidocaine used for local anesthesia. Using CT guidance, trocar needle was advanced into the fluid collection of the right upper quadrant. Modified Seldinger technique was used to place a 12 French pigtail catheter drain. Approximately 840 cc of yellowish fluid was aspirated. Culture was sent to the lab. Catheter was sutured in position and attached to bulb suction. Final CT was acquired. Patient tolerated the procedure well and remained hemodynamically stable throughout. No complications were encountered and no significant blood loss. FINDINGS: Fluid collection of the right upper quadrant was successfully decompressed with pigtail catheter drainage. Cholelithiasis persist. Stent of the biliary system. IMPRESSION: Status post CT-guided drainage  of right upper quadrant fluid collection. Signed, Dulcy Fanny. Dellia Nims, RPVI [facility] Electronically Signed   By: Corrie Mckusick D.O.   On: 09/30/2019 13:05   US Abdomen Limited RUQ  Result Date: 09/23/2019 CLINICAL DATA:  Nausea and vomiting. History of pancreatic cancer and biliary stent EXAM: ULTRASOUND ABDOMEN LIMITED RIGHT UPPER QUADRANT COMPARISON:  CT 09/23/2019 FINDINGS: Gallbladder: Gallbladder lumen is completely filled with heterogeneous sludge and small shadowing stones. Irregular wall thickening with associated pericholecystic fluid. No sonographic Murphy sign noted by sonographer. Common bile  duct: Diameter: 4 mm.  Pneumobilia noted. Liver: Reverberation artifact within the intrahepatic biliary ducts compatible with pneumobilia. No focal lesion identified. Within normal limits in parenchymal echogenicity. Portal vein is patent on color Doppler imaging with normal direction of blood flow towards the liver. Other: None. IMPRESSION: 1. Abnormal appearance of the gallbladder which is distended by sludge and small stones. Irregular gallbladder wall thickening and pericholecystic edema. Findings are suspicious for acute cholecystitis. 2. Pneumobilia. Electronically Signed   By: Davina Poke D.O.   On: 09/23/2019 11:43    Labs: BNP (last 3 results) Recent Labs    08/08/19 1544  BNP 70.6   Basic Metabolic Panel: Recent Labs  Lab 10/01/19 0709 10/02/19 0318 10/03/19 0615 10/04/19 0438 10/06/19 0305  NA 136 135 136 134* 134*  K 3.3* 3.3* 3.1* 3.2* 3.1*  CL 104 106 107 106 106  CO2 22 21* 22 19* 19*  GLUCOSE 107* 132* 140* 116* 100*  BUN 17 18 15 15 12   CREATININE 0.92 0.94 0.86 0.80 0.60*  CALCIUM 8.0* 7.6* 7.7* 7.7* 7.6*   Liver Function Tests: Recent Labs  Lab 10/01/19 0709 10/02/19 0318 10/03/19 0615 10/04/19 0438 10/06/19 0305  AST 18 19 22 19 22   ALT 13 15 14 13 15   ALKPHOS 97 93 105 105 96  BILITOT 1.6* 1.5* 1.4* 1.4* 0.9  PROT 5.3* 5.3* 5.2* 5.4* 5.4*  ALBUMIN 1.9* 1.8* 1.7* 1.8* 1.7*   No results for input(s): LIPASE, AMYLASE in the last 168 hours. No results for input(s): AMMONIA in the last 168 hours. CBC: Recent Labs  Lab 09/30/19 0445 09/30/19 1328 10/01/19 0709 10/02/19 0318 10/03/19 0615 10/04/19 0438 10/06/19 0305  WBC 13.3*   < > 16.6* 17.3* 12.5* 13.5* 11.1*  NEUTROABS 10.8*  --   --   --   --   --   --   HGB 8.3*   < > 8.4* 7.9* 7.3* 8.3* 7.7*  HCT 26.3*   < > 27.4* 25.5* 23.5* 26.3* 24.5*  MCV 83.2   < > 84.6 83.3 84.5 84.6 85.7  PLT 137*   < > 172 183 174 205 260   < > = values in this interval not displayed.   Cardiac  Enzymes: No results for input(s): CKTOTAL, CKMB, CKMBINDEX, TROPONINI in the last 168 hours. BNP: Invalid input(s): POCBNP CBG: No results for input(s): GLUCAP in the last 168 hours. D-Dimer No results for input(s): DDIMER in the last 72 hours. Hgb A1c No results for input(s): HGBA1C in the last 72 hours. Lipid Profile No results for input(s): CHOL, HDL, LDLCALC, TRIG, CHOLHDL, LDLDIRECT in the last 72 hours. Thyroid function studies No results for input(s): TSH, T4TOTAL, T3FREE, THYROIDAB in the last 72 hours.  Invalid input(s): FREET3 Anemia work up No results for input(s): VITAMINB12, FOLATE, FERRITIN, TIBC, IRON, RETICCTPCT in the last 72 hours. Urinalysis    Component Value Date/Time   COLORURINE AMBER (A)  09/23/2019 0848   APPEARANCEUR CLOUDY (A) 09/23/2019 0848   LABSPEC 1.023 09/23/2019 0848   PHURINE 5.0 09/23/2019 0848   GLUCOSEU NEGATIVE 09/23/2019 0848   HGBUR SMALL (A) 09/23/2019 0848   BILIRUBINUR NEGATIVE 09/23/2019 0848   KETONESUR NEGATIVE 09/23/2019 0848   PROTEINUR 100 (A) 09/23/2019 0848   NITRITE NEGATIVE 09/23/2019 0848   LEUKOCYTESUR NEGATIVE 09/23/2019 0848   Sepsis Labs Invalid input(s): PROCALCITONIN,  WBC,  LACTICIDVEN Microbiology Recent Results (from the past 240 hour(s))  SARS Coronavirus 2 by RT PCR (hospital order, performed in [place] hospital lab) Nasopharyngeal Nasopharyngeal Swab     Status: None   Collection Time: 09/30/19  7:44 AM   Specimen: Nasopharyngeal Swab  Result Value Ref Range Status   SARS Coronavirus 2 NEGATIVE NEGATIVE Final    Comment: (NOTE) SARS-CoV-2 target nucleic acids are NOT DETECTED.  The SARS-CoV-2 RNA is generally detectable in upper and lower respiratory specimens during the acute phase of infection. The lowest concentration of SARS-CoV-2 viral copies this assay can detect is 250 copies / mL. A negative result does not preclude SARS-CoV-2 infection and should not be used as the sole basis for treatment  or other patient management decisions.  A negative result may occur with improper specimen collection / handling, submission of specimen other than nasopharyngeal swab, presence of viral mutation(s) within the areas targeted by this assay, and inadequate number of viral copies (<250 copies / mL). A negative result must be combined with clinical observations, patient history, and epidemiological information.  Fact Sheet for Patients:   StrictlyIdeas.no  Fact Sheet for Healthcare Providers: BankingDealers.co.za  This test is not yet approved or  cleared by the [place] FDA and has been authorized for detection and/or diagnosis of SARS-CoV-2 by FDA under an Emergency Use Authorization (EUA).  This EUA will remain in effect (meaning this test can be used) for the duration of the COVID-19 declaration under Section 564(b)(1) of the Act, 21 U.S.C. section 360bbb-3(b)(1), unless the authorization is terminated or revoked sooner.  Performed at [facility], [place] [address], [place], [place] [address]   Aerobic/Anaerobic Culture (surgical/deep wound)     Status: None (Preliminary result)   Collection Time: 09/30/19 12:26 PM   Specimen: Abdomen; Abscess  Result Value Ref Range Status   Specimen Description ABDOMEN ABSCESS  Final   Special Requests NONE  Final   Gram Stain   Final    ABUNDANT WBC PRESENT,BOTH PMN AND MONONUCLEAR NO ORGANISMS SEEN    Culture   Final    RARE STREPTOCOCCUS CONSTELLATUS NO ANAEROBES ISOLATED; CULTURE IN PROGRESS FOR 5 DAYS    Report Status PENDING  Incomplete   Organism ID, Bacteria STREPTOCOCCUS CONSTELLATUS  Final      Susceptibility   Streptococcus constellatus - MIC*    PENICILLIN INTERMEDIATE Intermediate     CEFTRIAXONE 1 SENSITIVE Sensitive     ERYTHROMYCIN <=0.12 SENSITIVE Sensitive     LEVOFLOXACIN 0.5 SENSITIVE Sensitive     VANCOMYCIN 0.5 SENSITIVE Sensitive     AMPICILLIN  Value in next row Sensitive      SENSITIVEMIC =0.25 RELEASE PER DR REQUESTPerformed at [facility] [address] [place], [place] [address]    * RARE STREPTOCOCCUS CONSTELLATUS  SARS CORONAVIRUS 2 (TAT 6-24 HRS) Nasopharyngeal Nasopharyngeal Swab     Status: None   Collection Time: 10/04/19  4:41 PM   Specimen: Nasopharyngeal Swab  Result Value Ref Range Status   SARS Coronavirus 2 NEGATIVE NEGATIVE Final  Comment: (NOTE) SARS-CoV-2 target nucleic acids are NOT DETECTED.  The SARS-CoV-2 RNA is generally detectable in upper and lower respiratory specimens during the acute phase of infection. Negative results do not preclude SARS-CoV-2 infection, do not rule out co-infections with other pathogens, and should not be used as the sole basis for treatment or other patient management decisions. Negative results must be combined with clinical observations, patient history, and epidemiological information. The expected result is Negative.  Fact Sheet for Patients: SugarRoll.be  Fact Sheet for Healthcare Providers: https://www.woods-mathews.com/  This test is not yet approved or cleared by the [place] FDA and  has been authorized for detection and/or diagnosis of SARS-CoV-2 by FDA under an Emergency Use Authorization (EUA). This EUA will remain  in effect (meaning this test can be used) for the duration of the COVID-19 declaration under Se ction 564(b)(1) of the Act, 21 U.S.C. section 360bbb-3(b)(1), unless the authorization is terminated or revoked sooner.  Performed at [facility], [place] [address], [place], [place] [address]      Time coordinating discharge: 35  minutes  SIGNED: Antonieta Pert, MD  [facility] 10/06/2019, 10:39 AM  If 7PM-7AM, please contact night-coverage [email or web address]

## 2019-10-06 NOTE — TOC Transition Note (Addendum)
Transition of Care [facility]) - CM/SW Discharge Note   Patient Details  Name: Isaac Doyle MRN: [id number] Date of Birth: March 31, 1955  Transition of Care [facility] CM/SW Contact:  Lia Hopping, [place] Phone Number: 10/06/2019, 10:35 AM   Clinical Narrative:    Re:PCP Patient reports he is active with PCP Dr. Vance Peper at the [facility]. Patient reports he was given information for the [facility] in [place]. He is interested in using their services because they provide Lucianne Lei transportation to his appointments.  He plans to contact them after his rehab stay at [facility].   Daughter notified of discharge. [facility] ready to accept the patient.  Room 509P, Nurse call report to: [phone] [facility] called to transport the patient.   CSW was notified patient is active with [facility]. They will follow the patient while he is at [facility].    Final next level of care: Skilled Nursing Facility Barriers to Discharge: Barriers Resolved   Patient Goals and CMS Choice Patient states their goals for this hospitalization and ongoing recovery are:: I will go to rehab CMS Medicare.gov Compare Post Acute Care list provided to:: Patient Choice offered to / list presented to : Patient, Adult Children  Discharge Placement PASRR number recieved: 10/05/19            Patient chooses bed at: [facility] Patient to be transferred to facility by: [place] Name of family member notified: Milagros Evener Daughter [phone] Patient and family notified of of transfer: 10/06/19  Discharge Plan and Services   Discharge Planning Services: CM Consult                      HH Arranged: NA [place] Agency: NA        Social Determinants of Health ([place]) Interventions     Readmission Risk Interventions No flowsheet data found.

## 2019-10-06 NOTE — Plan of Care (Signed)
Patient was discharged to facility today. Report was called to [place] at [phone]. Discharge summary is in package that is sent to facility. All questions were answered and [facility] transported patient.

## 2019-10-11 ENCOUNTER — Other Ambulatory Visit: Payer: Self-pay | Admitting: Infectious Diseases

## 2019-10-11 ENCOUNTER — Other Ambulatory Visit: Payer: Self-pay | Admitting: *Deleted

## 2019-10-11 DIAGNOSIS — K668 Other specified disorders of peritoneum: Secondary | ICD-10-CM

## 2019-10-11 DIAGNOSIS — T8189XA Other complications of procedures, not elsewhere classified, initial encounter: Secondary | ICD-10-CM

## 2019-10-11 DIAGNOSIS — R188 Other ascites: Secondary | ICD-10-CM

## 2019-10-12 ENCOUNTER — Other Ambulatory Visit: Payer: Self-pay | Admitting: Physician Assistant

## 2019-10-12 DIAGNOSIS — T8189XA Other complications of procedures, not elsewhere classified, initial encounter: Secondary | ICD-10-CM

## 2019-10-12 DIAGNOSIS — R188 Other ascites: Secondary | ICD-10-CM

## 2019-10-13 LAB — TOTAL BILIRUBIN, BODY FLUID: Total bilirubin, fluid: 0.5 mg/dL

## 2019-10-18 ENCOUNTER — Ambulatory Visit: Payer: Medicare Other | Admitting: Hematology

## 2019-10-18 ENCOUNTER — Other Ambulatory Visit: Payer: Medicare Other

## 2019-10-19 ENCOUNTER — Other Ambulatory Visit: Payer: Self-pay

## 2019-10-19 ENCOUNTER — Other Ambulatory Visit: Payer: Self-pay | Admitting: Physician Assistant

## 2019-10-19 ENCOUNTER — Ambulatory Visit
Admission: RE | Admit: 2019-10-19 | Discharge: 2019-10-19 | Disposition: A | Discharge status: Home / Self Care | Payer: Medicare Other | Source: Ambulatory Visit | Attending: Infectious Diseases | Admitting: Infectious Diseases

## 2019-10-19 ENCOUNTER — Encounter: Payer: Self-pay | Admitting: *Deleted

## 2019-10-19 ENCOUNTER — Ambulatory Visit
Admission: RE | Admit: 2019-10-19 | Discharge: 2019-10-19 | Disposition: A | Discharge status: Home / Self Care | Payer: Medicare Other | Source: Ambulatory Visit | Attending: Physician Assistant | Admitting: Physician Assistant

## 2019-10-19 DIAGNOSIS — T8189XA Other complications of procedures, not elsewhere classified, initial encounter: Secondary | ICD-10-CM

## 2019-10-19 DIAGNOSIS — R188 Other ascites: Secondary | ICD-10-CM

## 2019-10-19 DIAGNOSIS — K668 Other specified disorders of peritoneum: Secondary | ICD-10-CM

## 2019-10-19 HISTORY — PX: IR RADIOLOGIST EVAL & MGMT: IMG5224

## 2019-10-19 MED ORDER — IOPAMIDOL (ISOVUE-300) INJECTION 61%: 100.0000 mL | Freq: Once | INTRAVENOUS | Status: DC | PRN

## 2019-10-19 NOTE — Progress Notes (Signed)
Referring Physician(s): Dr Barth Kirks Dr Ky Barban  Chief Complaint: The patient is seen in follow up today s/p  Biloma drain placed 8/27 in IR  History of present illness:  Hx Pancreatic Cancer-- Biliary stent placed 8/21 Developed abd pain and fever Imaging revealed biloma/abscess Drain placed in IR 8/27 OP is milky yellow still-- significant OP remains Flushing 5 cc daily Living at [facility] Taking Augmentin BID Denies fever chills Feels well otherwise  To go to [place] with Dr Burr Medico and Barth Kirks MD     Past Medical History:  Diagnosis Date  . Hypertension     Past Surgical History:  Procedure Laterality Date  . BILIARY STENT PLACEMENT  05/11/2019   Procedure: BILIARY STENT PLACEMENT;  Surgeon: Irene Shipper, MD;  Location: [facility] ENDOSCOPY;  Service: Endoscopy;;  . BILIARY STENT PLACEMENT  06/03/2019   Procedure: BILIARY STENT PLACEMENT;  Surgeon: Jackquline Denmark, MD;  Location: [facility] ENDOSCOPY;  Service: Endoscopy;;  . BILIARY STENT PLACEMENT N/A 09/24/2019   Procedure: BILIARY STENT PLACEMENT;  Surgeon: Ladene Artist, MD;  Location: [facility] ENDOSCOPY;  Service: Endoscopy;  Laterality: N/A;  . ERCP N/A 05/11/2019   Procedure: ENDOSCOPIC RETROGRADE CHOLANGIOPANCREATOGRAPHY (ERCP);  Surgeon: Irene Shipper, MD;  Location: [facility] ENDOSCOPY;  Service: Endoscopy;  Laterality: N/A;  with   . ERCP N/A 06/03/2019   Procedure: ENDOSCOPIC RETROGRADE CHOLANGIOPANCREATOGRAPHY (ERCP);  Surgeon: Jackquline Denmark, MD;  Location: [facility] ENDOSCOPY;  Service: Endoscopy;  Laterality: N/A;  . ERCP N/A 09/24/2019   Procedure: ENDOSCOPIC RETROGRADE CHOLANGIOPANCREATOGRAPHY (ERCP);  Surgeon: Ladene Artist, MD;  Location: [facility] ENDOSCOPY;  Service: Endoscopy;  Laterality: N/A;  . ESOPHAGOGASTRODUODENOSCOPY (EGD) WITH PROPOFOL N/A 05/19/2019   Procedure: ESOPHAGOGASTRODUODENOSCOPY (EGD) WITH PROPOFOL;  Surgeon: [name], MD;  Location: [facility] ENDOSCOPY;  Service: Endoscopy;  Laterality: N/A;  . EUS  N/A 05/19/2019   Procedure: UPPER ENDOSCOPIC ULTRASOUND (EUS) LINEAR;  Surgeon: [name], MD;  Location: [facility] ENDOSCOPY;  Service: Endoscopy;  Laterality: N/A;  . FINE NEEDLE ASPIRATION N/A 05/19/2019   Procedure: FINE NEEDLE ASPIRATION (FNA) LINEAR;  Surgeon: [name], MD;  Location: [facility] ENDOSCOPY;  Service: Endoscopy;  Laterality: N/A;  . IR IMAGING GUIDED PORT INSERTION  06/08/2019  . REMOVAL OF STONES  09/24/2019   Procedure: REMOVAL OF STONES;  Surgeon: Ladene Artist, MD;  Location: [facility] ENDOSCOPY;  Service: Endoscopy;;  . [name]  05/11/2019   Procedure: SPHINCTEROTOMY;  Surgeon: Irene Shipper, MD;  Location: [facility] ENDOSCOPY;  Service: Endoscopy;;  . [name] REMOVAL  06/03/2019   Procedure: STENT REMOVAL;  Surgeon: Jackquline Denmark, MD;  Location: [facility] ENDOSCOPY;  Service: Endoscopy;;  . STENT REMOVAL  09/24/2019   Procedure: STENT REMOVAL;  Surgeon: Ladene Artist, MD;  Location: [facility] ENDOSCOPY;  Service: Endoscopy;;    Allergies: Patient has no known allergies.  Medications: Prior to Admission medications   Medication Sig Start Date End Date Taking? Authorizing Provider  amoxicillin-clavulanate (AUGMENTIN) 875-125 MG tablet Take 1 tablet by mouth 2 (two) times daily. 10/06/19   Antonieta Pert, MD  atenolol (TENORMIN) 100 MG tablet Take 1 tablet (100 mg total) by mouth daily. Hold until follow-up with your PCP Patient taking differently: Take 100 mg by mouth daily.  09/28/19   Rai, Vernelle Emerald, MD  cetirizine (ZYRTEC) 10 MG tablet Take 10 mg by mouth daily.    [provider]  diphenoxylate-atropine (LOMOTIL) 2.5-0.025 MG tablet Take 1-2 tablets by mouth 4 (four) times daily as needed  for diarrhea or loose stools. Patient taking differently: Take 2 tablets by mouth 4 (four) times daily as needed for diarrhea or loose stools.  07/14/19   Truitt Merle, MD  ferrous sulfate 325 (65 FE) MG tablet Take 1 tablet (325 mg total) by mouth 2 (two) times daily with a meal. 10/06/19   Antonieta Pert,  MD  guaifenesin (HUMIBID E) 400 MG TABS tablet Take 1 tablet (400 mg total) by mouth every 6 (six) hours as needed. Patient taking differently: Take 400 mg by mouth in the morning and at bedtime.  05/13/19   Geradine Girt, DO  hydrocortisone (ANUSOL-HC) 2.5 % rectal cream Apply 1 application topically 4 (four) times daily as needed for hemorrhoids. 09/28/19   Rai, Vernelle Emerald, MD  lidocaine-prilocaine (EMLA) cream Apply 1 application topically as needed. Patient taking differently: Apply 1 application topically as needed (To access the port).  06/23/19   Truitt Merle, MD  loperamide (IMODIUM) 2 MG capsule Take 1-2 capsules (2-4 mg total) by mouth every 6 (six) hours as needed for diarrhea or loose stools. 07/14/19   Truitt Merle, MD  magnesium oxide (MAG-OX) 400 (241.3 Mg) MG tablet Take 1 tablet (400 mg total) by mouth daily. Patient taking differently: Take 400 mg by mouth every other day.  05/14/19   Geradine Girt, DO  methocarbamol (ROBAXIN) 500 MG tablet Take 1 tablet (500 mg total) by mouth every 6 (six) hours as needed for muscle spasms. 09/28/19   Rai, Vernelle Emerald, MD  Nutritional Supplements (,FEEDING SUPPLEMENT, PROSOURCE PLUS) liquid Take 30 mLs by mouth 2 (two) times daily between meals. 10/06/19 11/05/19  Antonieta Pert, MD  ondansetron (ZOFRAN ODT) 4 MG disintegrating tablet Take 1 tablet (4 mg total) by mouth every 8 (eight) hours as needed for nausea or vomiting. 09/28/19   Rai, Ripudeep K, MD  pantoprazole (PROTONIX) 40 MG tablet Take 1 tablet (40 mg total) by mouth daily. 09/28/19 01/26/20  Rai, Ripudeep K, MD  potassium chloride SA (KLOR-CON) 20 MEQ tablet Take 2 tablets (40 mEq total) by mouth daily. 08/30/19   Truitt Merle, MD  traMADol (ULTRAM) 50 MG tablet Take 1 tablet (50 mg total) by mouth every 8 (eight) hours as needed for up to 4 doses for moderate pain or severe pain. 10/06/19   Antonieta Pert, MD     Family History  Problem Relation Age of Onset  . Lymphoma Mother 58       cancer in lymph nodes,  unsure of primary  . Heart attack Father   . Lupus Sister     Social History   Socioeconomic History  . Marital status: Divorced    Spouse name: Not on file  . Number of children: 1  . Years of education: Not on file  . Highest education level: Not on file  Occupational History  . Occupation: disabled veteran   Tobacco Use  . Smoking status: Never Smoker  . Smokeless tobacco: Never Used  . Tobacco comment: plan to quit completely   Substance and Sexual Activity  . Alcohol use: Yes    Alcohol/week: 22.0 standard drinks    Types: 12 Cans of beer, 10 Shots of liquor per week  . Drug use: Not Currently  . Sexual activity: Not on file  Other Topics Concern  . Not on file  Social History Narrative  . Not on file   Social Determinants of Health   Financial Resource Strain:   . Difficulty of Paying Living Expenses: Not  on file  Food Insecurity:   . Worried About Charity fundraiser in the Last Year: Not on file  . Ran Out of Food in the Last Year: Not on file  Transportation Needs:   . Lack of Transportation (Medical): Not on file  . Lack of Transportation (Non-Medical): Not on file  Physical Activity:   . Days of Exercise per Week: Not on file  . Minutes of Exercise per Session: Not on file  Stress:   . Feeling of Stress : Not on file  Social Connections:   . Frequency of Communication with Friends and Family: Not on file  . Frequency of Social Gatherings with Friends and Family: Not on file  . Attends Religious Services: Not on file  . Active Member of Clubs or Organizations: Not on file  . Attends Archivist Meetings: Not on file  . Marital Status: Not on file     Vital Signs: There were no vitals taken for this visit.  Physical Exam Skin:    General: Skin is warm.     Comments: Site of drain is clean and dry NT no bleeding No sign of infection OP 50 cc in JP Milky yellow     CT today shows improvement but collection not gone per Dr  Kathlene Cote  Imaging: No results found.  Labs:  CBC: Recent Labs    10/02/19 0318 10/03/19 0615 10/04/19 0438 10/06/19 0305  WBC 17.3* 12.5* 13.5* 11.1*  HGB 7.9* 7.3* 8.3* 7.7*  HCT 25.5* 23.5* 26.3* 24.5*  PLT 183 174 205 260    COAGS: Recent Labs    06/03/19 0146 09/23/19 0930 09/24/19 0418 09/30/19 0445  INR 1.0 1.4* 1.7* 1.6*    BMP: Recent Labs    10/02/19 0318 10/03/19 0615 10/04/19 0438 10/06/19 0305  NA 135 136 134* 134*  K 3.3* 3.1* 3.2* 3.1*  CL 106 107 106 106  CO2 21* 22 19* 19*  GLUCOSE 132* 140* 116* 100*  BUN 18 15 15 12   CALCIUM 7.6* 7.7* 7.7* 7.6*  CREATININE 0.94 0.86 0.80 0.60*  GFRNONAA >60 >60 >60 >60  GFRAA >60 >60 >60 >60    LIVER FUNCTION TESTS: Recent Labs    10/02/19 0318 10/03/19 0615 10/04/19 0438 10/06/19 0305  BILITOT 1.5* 1.4* 1.4* 0.9  AST 19 22 19 22   ALT 15 14 13 15   ALKPHOS 93 105 105 96  PROT 5.3* 5.2* 5.4* 5.4*  ALBUMIN 1.8* 1.7* 1.8* 1.7*    Assessment:  Biloma/ abscess drain intact CT shows improvement- collection not gone Drain to remain Continue flusges daily Continue monitoring OP For re scan CT Abd w/o Cx per Dr Kathlene Cote--- 2 weeks Pt is aware Note and appt card sent to [facility] with pt   Signed: Lavonia Drafts, PA-C 10/19/2019, 1:44 PM   Please refer to Dr. Kathlene Cote attestation of this note for management and plan.

## 2019-10-20 ENCOUNTER — Encounter: Payer: Self-pay | Admitting: Infectious Diseases

## 2019-10-20 ENCOUNTER — Ambulatory Visit (INDEPENDENT_AMBULATORY_CARE_PROVIDER_SITE_OTHER): Payer: No Typology Code available for payment source | Admitting: Infectious Diseases

## 2019-10-20 ENCOUNTER — Other Ambulatory Visit: Payer: Self-pay

## 2019-10-20 VITALS — BP 116/78 | HR 78 | Temp 97.6°F

## 2019-10-20 DIAGNOSIS — Z23 Encounter for immunization: Secondary | ICD-10-CM | POA: Diagnosis not present

## 2019-10-20 DIAGNOSIS — B999 Unspecified infectious disease: Secondary | ICD-10-CM

## 2019-10-20 DIAGNOSIS — K651 Peritoneal abscess: Secondary | ICD-10-CM | POA: Insufficient documentation

## 2019-10-20 MED ORDER — AMOXICILLIN-POT CLAVULANATE 875-125 MG PO TABS: 1.0000 | ORAL_TABLET | Freq: Two times a day (BID) | ORAL | 2 refills | Status: DC

## 2019-10-20 NOTE — Progress Notes (Addendum)
[facility]                                                             [place], [place], [place], [address]                                                                  Phn. [phone]; Fax: [phone]                                                                             Date: 10/20/19  Reason for Referral: Initial Visit for Intraabdominal abscess/biloma   Assessment RT Upper Quadrant Abscess ( ? Biloma) in the setting of known h/o pancreatic ca and stent placement - Abscess cultures growing Streptococcus constellatus   Medication Monitoring - On PO Augmentin. Tolerating well  ImmunizationL wants to get Flu vaccine today   Plan Continue 2 more weeks of Augmentin as is  Follow up in 2 weeks after repeat CT abdomen/pelvis  CBC and CMP today  All questions and concerns were discussed and addressed  I spent greater than 60 minutes with the patient including greater than 50% of time in face to face counsel of the patient and in coordination of their care.   Rosiland Oz, MD [facility]  Office phone [phone] Fax no. [phone] ______________________________________________________________________________________________________________________  HPI: 64 year old African-American male with a past medical history of hypertension, pancreatic cancer status post recent bile duct stent exchange on 09/24/19 who is here for evaluation and management of biloma/intra-abdominal abscess.  Patient was admitted to the hospital 8/20-8/25 for acute cholangitis CT abdomen pelvis 8/20 showed findings highly suspicious for acute cholecystitis gallbladder distention and pericholecystic edema in the setting of gallstones.  Common bile duct stent in place with pneumobilia.  Ultrasound right upper quadrant showed abnormal appearance of the gallbladder which is  distended by sludge and small stones.  Irregular gallbladder wall thickening and pericholecystic edema.  Findings suspicious for acute cholecystitis.  Blood cx 8/20 Grew anaerococcus prevotii 1 bottle. Patient underwent ERCP with biliary sludge removal and biliary stent exchange patient was discharged on Augmentin for 10 days.  Patient was readmitted 8/27-9/2 with worsening upper quadrant abdominal pain CT abdomen pelvis showed 20 18-6 into 15 cm peritoneal versus subcapsular organized collection along the right liver suspicious for abscess versus biloma patient underwent image guided right upper quadrant drain placement by VIR on 8/27.  Drain culture grew Streptococcus constellatus and was discharged on Augmentin.  Patient is here with his daughter.  He is currently living in a rehabilitation center.  He had a follow-up CT abdomen pelvis on 9/15 which showed decrease in size of the perihepatic biloma after drain placement.  Moderate fluid collection remains with some small gas bubbles are  present internally like related to flushing.  The indwelling percutaneous drain is well positioned in the posterior dependent aspect of the collection.  Stable pneumobilia and positioning of the common bile duct metallic stent.  Stable cholelithiasis with slight decrease in the gallbladder.  He is going to get a repeat CT abdomen pelvis in 2 weeks for possible need for removal of the drain.  He denies any fever, chills, nausea, vomiting or diarrhea.  He has poor appetite and has been having mostly pured food.  He denies having any pain or tenderness in his abdomen.  ROS: Constitutional: Negative for fever, chills, activity change, , fatigue and unexpected weight change.  HENT: Negative for congestion, sore throat, rhinorrhea, sneezing, trouble swallowing and sinus pressure.  Eyes: Negative for photophobia and visual disturbance.  Respiratory: Negative for cough, chest tightness, shortness of breath, wheezing and  stridor.  Cardiovascular: Negative for chest pain, palpitations and leg swelling.  Gastrointestinal: Negative for nausea, vomiting, abdominal pain, diarrhea, constipation, blood in stool, abdominal distention and anal bleeding.  Genitourinary: Negative for dysuria, hematuria, flank pain and difficulty urinating.  Musculoskeletal: Negative for myalgias, back pain, joint swelling, arthralgias and gait problem.  Skin: Negative for color change, pallor, rash and wound.  Neurological: Negative for dizziness, tremors, weakness and light-headedness.  Hematological: Negative for adenopathy. Does not bruise/bleed easily.  Psychiatric/Behavioral: Negative for behavioral problems, confusion, sleep disturbance, dysphoric mood, decreased concentration and agitation.   Past Medical History:  Diagnosis Date  . Hypertension    Past Surgical History:  Procedure Laterality Date  . BILIARY STENT PLACEMENT  05/11/2019   Procedure: BILIARY STENT PLACEMENT;  Surgeon: Irene Shipper, MD;  Location: [facility] ENDOSCOPY;  Service: Endoscopy;;  . BILIARY STENT PLACEMENT  06/03/2019   Procedure: BILIARY STENT PLACEMENT;  Surgeon: Jackquline Denmark, MD;  Location: [facility] ENDOSCOPY;  Service: Endoscopy;;  . BILIARY STENT PLACEMENT N/A 09/24/2019   Procedure: BILIARY STENT PLACEMENT;  Surgeon: Ladene Artist, MD;  Location: [facility] ENDOSCOPY;  Service: Endoscopy;  Laterality: N/A;  . ERCP N/A 05/11/2019   Procedure: ENDOSCOPIC RETROGRADE CHOLANGIOPANCREATOGRAPHY (ERCP);  Surgeon: Irene Shipper, MD;  Location: [facility] ENDOSCOPY;  Service: Endoscopy;  Laterality: N/A;  with   . ERCP N/A 06/03/2019   Procedure: ENDOSCOPIC RETROGRADE CHOLANGIOPANCREATOGRAPHY (ERCP);  Surgeon: Jackquline Denmark, MD;  Location: [facility] ENDOSCOPY;  Service: Endoscopy;  Laterality: N/A;  . ERCP N/A 09/24/2019   Procedure: ENDOSCOPIC RETROGRADE CHOLANGIOPANCREATOGRAPHY (ERCP);  Surgeon: Ladene Artist, MD;  Location: [facility] ENDOSCOPY;  Service: Endoscopy;  Laterality: N/A;  .  ESOPHAGOGASTRODUODENOSCOPY (EGD) WITH PROPOFOL N/A 05/19/2019   Procedure: ESOPHAGOGASTRODUODENOSCOPY (EGD) WITH PROPOFOL;  Surgeon: [name], MD;  Location: [facility] ENDOSCOPY;  Service: Endoscopy;  Laterality: N/A;  . EUS N/A 05/19/2019   Procedure: UPPER ENDOSCOPIC ULTRASOUND (EUS) LINEAR;  Surgeon: [name], MD;  Location: [facility] ENDOSCOPY;  Service: Endoscopy;  Laterality: N/A;  . FINE NEEDLE ASPIRATION N/A 05/19/2019   Procedure: FINE NEEDLE ASPIRATION (FNA) LINEAR;  Surgeon: [name], MD;  Location: [facility] ENDOSCOPY;  Service: Endoscopy;  Laterality: N/A;  . IR IMAGING GUIDED PORT INSERTION  06/08/2019  . IR RADIOLOGIST EVAL & MGMT  10/19/2019  . REMOVAL OF STONES  09/24/2019   Procedure: REMOVAL OF STONES;  Surgeon: Ladene Artist, MD;  Location: [facility] ENDOSCOPY;  Service: Endoscopy;;  . [name]  05/11/2019   Procedure: [name];  Surgeon: Irene Shipper, MD;  Location: [facility] ENDOSCOPY;  Service: Endoscopy;;  . STENT REMOVAL  06/03/2019   Procedure: STENT REMOVAL;  Surgeon: Jackquline Denmark, MD;  Location: [facility] ENDOSCOPY;  Service: Endoscopy;;  . STENT REMOVAL  09/24/2019   Procedure: STENT REMOVAL;  Surgeon: Ladene Artist, MD;  Location: [facility] ENDOSCOPY;  Service: Endoscopy;;   Current Outpatient Medications on File Prior to Visit  Medication Sig Dispense Refill  . amoxicillin-clavulanate (AUGMENTIN) 875-125 MG tablet Take 1 tablet by mouth 2 (two) times daily.  0  . atenolol (TENORMIN) 100 MG tablet Take 1 tablet (100 mg total) by mouth daily. Hold until follow-up with your PCP (Patient taking differently: Take 100 mg by mouth daily. ) 30 tablet 0  . cetirizine (ZYRTEC) 10 MG tablet Take 10 mg by mouth daily.    . diphenoxylate-atropine (LOMOTIL) 2.5-0.025 MG tablet Take 1-2 tablets by mouth 4 (four) times daily as needed for diarrhea or loose stools. (Patient taking differently: Take 2 tablets by mouth 4 (four) times daily as needed for diarrhea or loose stools. ) 90 tablet 0  .  ferrous sulfate 325 (65 FE) MG tablet Take 1 tablet (325 mg total) by mouth 2 (two) times daily with a meal.  3  . guaifenesin (HUMIBID E) 400 MG TABS tablet Take 1 tablet (400 mg total) by mouth every 6 (six) hours as needed. (Patient taking differently: Take 400 mg by mouth in the morning and at bedtime. ) 84 tablet   . hydrocortisone (ANUSOL-HC) 2.5 % rectal cream Apply 1 application topically 4 (four) times daily as needed for hemorrhoids. 30 g 2  . lidocaine-prilocaine (EMLA) cream Apply 1 application topically as needed. (Patient taking differently: Apply 1 application topically as needed (To access the port). ) 30 g 0  . loperamide (IMODIUM) 2 MG capsule Take 1-2 capsules (2-4 mg total) by mouth every 6 (six) hours as needed for diarrhea or loose stools. 90 capsule 1  . magnesium oxide (MAG-OX) 400 (241.3 Mg) MG tablet Take 1 tablet (400 mg total) by mouth daily. (Patient taking differently: Take 400 mg by mouth every other day. )    . methocarbamol (ROBAXIN) 500 MG tablet Take 1 tablet (500 mg total) by mouth every 6 (six) hours as needed for muscle spasms. 90 tablet 0  . Nutritional Supplements (,FEEDING SUPPLEMENT, PROSOURCE PLUS) liquid Take 30 mLs by mouth 2 (two) times daily between meals. 1800 mL 0  . ondansetron (ZOFRAN ODT) 4 MG disintegrating tablet Take 1 tablet (4 mg total) by mouth every 8 (eight) hours as needed for nausea or vomiting. 20 tablet 0  . pantoprazole (PROTONIX) 40 MG tablet Take 1 tablet (40 mg total) by mouth daily. 30 tablet 3  . potassium chloride SA (KLOR-CON) 20 MEQ tablet Take 2 tablets (40 mEq total) by mouth daily. 28 tablet 1  . traMADol (ULTRAM) 50 MG tablet Take 1 tablet (50 mg total) by mouth every 8 (eight) hours as needed for up to 4 doses for moderate pain or severe pain. 4 tablet 0   No current facility-administered medications on file prior to visit.   No Known Allergies  Social History   Socioeconomic History  . Marital status: Divorced     Spouse name: Not on file  . Number of children: 1  . Years of education: Not on file  . Highest education level: Not on file  Occupational History  . Occupation: disabled veteran   Tobacco Use  . Smoking status: Never Smoker  . Smokeless tobacco: Never Used  . Tobacco comment: plan to quit completely   Substance and Sexual Activity  . Alcohol  use: Yes    Alcohol/week: 22.0 standard drinks    Types: 12 Cans of beer, 10 Shots of liquor per week  . Drug use: Not Currently  . Sexual activity: Not on file  Other Topics Concern  . Not on file  Social History Narrative  . Not on file   Social Determinants of Health   Financial Resource Strain:   . Difficulty of Paying Living Expenses: Not on file  Food Insecurity:   . Worried About Charity fundraiser in the Last Year: Not on file  . Ran Out of Food in the Last Year: Not on file  Transportation Needs:   . Lack of Transportation (Medical): Not on file  . Lack of Transportation (Non-Medical): Not on file  Physical Activity:   . Days of Exercise per Week: Not on file  . Minutes of Exercise per Session: Not on file  Stress:   . Feeling of Stress : Not on file  Social Connections:   . Frequency of Communication with Friends and Family: Not on file  . Frequency of Social Gatherings with Friends and Family: Not on file  . Attends Religious Services: Not on file  . Active Member of Clubs or Organizations: Not on file  . Attends Archivist Meetings: Not on file  . Marital Status: Not on file  Intimate Partner Violence:   . Fear of Current or Ex-Partner: Not on file  . Emotionally Abused: Not on file  . Physically Abused: Not on file  . Sexually Abused: Not on file   Family History  Problem Relation Age of Onset  . Lymphoma Mother 40       cancer in lymph nodes, unsure of primary  . Heart attack Father   . Lupus Sister     Vitals BP 116/78   Pulse 78   Temp 97.6 F (36.4 C) (Oral)    Examination  General -  not in acute distress, comfortably sitting in chair HEENT - PEERLA, no pallor and no icterus, multiple missing teeth, dental caries  Chest - b/l clear air entry, no additional sounds CVS- Normal s1s2, RRR Abdomen - Soft, Non tender , non distended, RUQ has  Clean bandage overlying the drain site - drainage bulb is filled with clay colored fluid Ext- no pedal edema Neuro: grossly normal Back - WNL Psych : calm and cooperative   Recent labs CBC Latest Ref Rng & Units 10/06/2019 10/04/2019 10/03/2019  WBC 4.0 - 10.5 K/uL 11.1(H) 13.5(H) 12.5(H)  Hemoglobin 13.0 - 17.0 g/dL 7.7(L) 8.3(L) 7.3(L)  Hematocrit 39 - 52 % 24.5(L) 26.3(L) 23.5(L)  Platelets 150 - 400 K/uL 260 205 174   CMP Latest Ref Rng & Units 10/06/2019 10/04/2019 10/03/2019  Glucose 70 - 99 mg/dL 100(H) 116(H) 140(H)  BUN 8 - 23 mg/dL 12 15 15   Creatinine 0.61 - 1.24 mg/dL 0.60(L) 0.80 0.86  Sodium 135 - 145 mmol/L 134(L) 134(L) 136  Potassium 3.5 - 5.1 mmol/L 3.1(L) 3.2(L) 3.1(L)  Chloride 98 - 111 mmol/L 106 106 107  CO2 22 - 32 mmol/L 19(L) 19(L) 22  Calcium 8.9 - 10.3 mg/dL 7.6(L) 7.7(L) 7.7(L)  Total Protein 6.5 - 8.1 g/dL 5.4(L) 5.4(L) 5.2(L)  Total Bilirubin 0.3 - 1.2 mg/dL 0.9 1.4(H) 1.4(H)  Alkaline Phos 38 - 126 U/L 96 105 105  AST 15 - 41 U/L 22 19 22   ALT 0 - 44 U/L 15 13 14     Pertinent Microbiology Results for orders placed or performed during  the hospital encounter of 09/30/19  SARS Coronavirus 2 by RT PCR (hospital order, performed in [facility] hospital lab) Nasopharyngeal Nasopharyngeal Swab     Status: None   Collection Time: 09/30/19  7:44 AM   Specimen: Nasopharyngeal Swab  Result Value Ref Range Status   SARS Coronavirus 2 NEGATIVE NEGATIVE Final    Comment: (NOTE) SARS-CoV-2 target nucleic acids are NOT DETECTED.  The SARS-CoV-2 RNA is generally detectable in upper and lower respiratory specimens during the acute phase of infection. The lowest concentration of SARS-CoV-2 viral copies this assay  can detect is 250 copies / mL. A negative result does not preclude SARS-CoV-2 infection and should not be used as the sole basis for treatment or other patient management decisions.  A negative result may occur with improper specimen collection / handling, submission of specimen other than nasopharyngeal swab, presence of viral mutation(s) within the areas targeted by this assay, and inadequate number of viral copies (<250 copies / mL). A negative result must be combined with clinical observations, patient history, and epidemiological information.  Fact Sheet for Patients:   StrictlyIdeas.no  Fact Sheet for Healthcare Providers: BankingDealers.co.za  This test is not yet approved or  cleared by the [place] FDA and has been authorized for detection and/or diagnosis of SARS-CoV-2 by FDA under an Emergency Use Authorization (EUA).  This EUA will remain in effect (meaning this test can be used) for the duration of the COVID-19 declaration under Section 564(b)(1) of the Act, 21 U.S.C. section 360bbb-3(b)(1), unless the authorization is terminated or revoked sooner.  Performed at [facility], [place] [address], [place], [place] [address]   Aerobic/Anaerobic Culture (surgical/deep wound)     Status: None   Collection Time: 09/30/19 12:26 PM   Specimen: Abdomen; Abscess  Result Value Ref Range Status   Specimen Description   Final    ABDOMEN ABSCESS Performed at [place] [address], [place], [place] [address]    Special Requests   Final    NONE Performed at [facility], [place] [address], [place], [place] [address]    Gram Stain   Final    ABUNDANT WBC PRESENT,BOTH PMN AND MONONUCLEAR NO ORGANISMS SEEN    Culture   Final    RARE STREPTOCOCCUS CONSTELLATUS NO ANAEROBES ISOLATED Performed at [facility], [place] [address], [place], [place] [address]    Report  Status 10/06/2019 FINAL  Final   Organism ID, Bacteria STREPTOCOCCUS CONSTELLATUS  Final      Susceptibility   Streptococcus constellatus - MIC*    PENICILLIN INTERMEDIATE Intermediate     CEFTRIAXONE 1 SENSITIVE Sensitive     ERYTHROMYCIN <=0.12 SENSITIVE Sensitive     LEVOFLOXACIN 0.5 SENSITIVE Sensitive     VANCOMYCIN 0.5 SENSITIVE Sensitive     AMPICILLIN Value in next row Sensitive      SENSITIVEMIC =0.25 RELEASE PER DR REQUEST    * RARE STREPTOCOCCUS CONSTELLATUS  SARS CORONAVIRUS 2 (TAT 6-24 HRS) Nasopharyngeal Nasopharyngeal Swab     Status: None   Collection Time: 10/04/19  4:41 PM   Specimen: Nasopharyngeal Swab  Result Value Ref Range Status   SARS Coronavirus 2 NEGATIVE NEGATIVE Final    Comment: (NOTE) SARS-CoV-2 target nucleic acids are NOT DETECTED.  The SARS-CoV-2 RNA is generally detectable in upper and lower respiratory specimens during the acute phase of infection. Negative results do not preclude SARS-CoV-2 infection, do not rule out co-infections with other pathogens, and should not  be used as the sole basis for treatment or other patient management decisions. Negative results must be combined with clinical observations, patient history, and epidemiological information. The expected result is Negative.  Fact Sheet for Patients: SugarRoll.be  Fact Sheet for Healthcare Providers: https://www.woods-mathews.com/  This test is not yet approved or cleared by the [place] FDA and  has been authorized for detection and/or diagnosis of SARS-CoV-2 by FDA under an Emergency Use Authorization (EUA). This EUA will remain  in effect (meaning this test can be used) for the duration of the COVID-19 declaration under Se ction 564(b)(1) of the Act, 21 U.S.C. section 360bbb-3(b)(1), unless the authorization is terminated or revoked sooner.  Performed at [facility], [place] [address], [place], [place] [address]      Pertinent Imaging CT abdomen/pelvis 10/19/19 FINDINGS: Lower chest: Trace left pleural effusion.  Hepatobiliary: Stable pneumobilia and positioning common bile duct metallic stent. Stable calcified calculi in the gallbladder. Gallbladder distension slightly improved.  Size of the perihepatic biloma has decreased after drain placement with maximal thickness approximately 4.3 cm compared to approximately 7 cm prior to drain placement. Moderate fluid collection remains with some small gas bubbles present internally likely related to flushing. The indwelling percutaneous drain is well positioned in the posterior dependent aspect of the collection.  Pancreas: Stable appearance.  Spleen: Normal in size without focal abnormality.  Adrenals/Urinary Tract: No hydronephrosis. No adrenal masses. The bladder is unremarkable.  Stomach/Bowel: Bowel shows no evidence of obstruction, ileus or perforation. No free intraperitoneal air identified  Vascular/Lymphatic: No significant vascular findings are present. No enlarged abdominal or pelvic lymph nodes.  Reproductive: Prostate is unremarkable.  Other: Small volume of scattered ascites in the peritoneal cavity. This appears slightly less prominent overall compared to the prior CT. No hernias identified.  Musculoskeletal: Stable mild loss of height of the T12 vertebral body.  IMPRESSION: 1. Decrease in size of the perihepatic biloma after drain placement. Moderate fluid collection remains with some small gas bubbles present internally likely related to flushing. The indwelling percutaneous drain is well positioned in the posterior dependent aspect of the collection. 2. Stable pneumobilia and positioning of common bile duct metallic stent. 3. Stable cholelithiasis with slight decrease in gallbladder distension. 4. Small volume of scattered ascites in the peritoneal cavity. This appears slightly less prominent overall  compared to the prior CT. 5. Trace left pleural effusion. 6. Stable mild loss of height of the T12 vertebral body.   All pertinent labs/Imagings/notes reviewed. All pertinent plain films and CT images have been personally visualized and interpreted; radiology reports have been reviewed. Decision making incorporated into the Impression / Recommendations.

## 2019-10-21 LAB — CBC
HCT: 28 % — ABNORMAL LOW (ref 38.5–50.0)
Hemoglobin: 8.9 g/dL — ABNORMAL LOW (ref 13.2–17.1)
MCH: 25.9 pg — ABNORMAL LOW (ref 27.0–33.0)
MCHC: 31.8 g/dL — ABNORMAL LOW (ref 32.0–36.0)
MCV: 81.6 fL (ref 80.0–100.0)
MPV: 10.6 fL (ref 7.5–12.5)
Platelets: 217 10*3/uL (ref 140–400)
RBC: 3.43 10*6/uL — ABNORMAL LOW (ref 4.20–5.80)
RDW: 15.1 % — ABNORMAL HIGH (ref 11.0–15.0)
WBC: 7.2 10*3/uL (ref 3.8–10.8)

## 2019-10-21 LAB — COMPREHENSIVE METABOLIC PANEL
AG Ratio: 0.8 (calc) — ABNORMAL LOW (ref 1.0–2.5)
ALT: 18 U/L (ref 9–46)
AST: 36 U/L — ABNORMAL HIGH (ref 10–35)
Albumin: 3 g/dL — ABNORMAL LOW (ref 3.6–5.1)
Alkaline phosphatase (APISO): 159 U/L — ABNORMAL HIGH (ref 35–144)
BUN: 10 mg/dL (ref 7–25)
CO2: 26 mmol/L (ref 20–32)
Calcium: 9 mg/dL (ref 8.6–10.3)
Chloride: 102 mmol/L (ref 98–110)
Creat: 0.74 mg/dL (ref 0.70–1.25)
Globulin: 3.8 g/dL (calc) — ABNORMAL HIGH (ref 1.9–3.7)
Glucose, Bld: 127 mg/dL — ABNORMAL HIGH (ref 65–99)
Potassium: 5 mmol/L (ref 3.5–5.3)
Sodium: 135 mmol/L (ref 135–146)
Total Bilirubin: 0.9 mg/dL (ref 0.2–1.2)
Total Protein: 6.8 g/dL (ref 6.1–8.1)

## 2019-10-27 ENCOUNTER — Telehealth: Payer: Self-pay | Admitting: Hematology

## 2019-10-27 NOTE — Telephone Encounter (Signed)
Called pt daughter per 9/22 sch msg - pt daughter aware of appt on 10/7

## 2019-10-31 ENCOUNTER — Inpatient Hospital Stay: Payer: No Typology Code available for payment source

## 2019-10-31 ENCOUNTER — Inpatient Hospital Stay: Payer: No Typology Code available for payment source | Admitting: Hematology

## 2019-11-02 ENCOUNTER — Ambulatory Visit
Admission: RE | Admit: 2019-11-02 | Discharge: 2019-11-02 | Disposition: A | Discharge status: Home / Self Care | Payer: Medicare Other | Source: Ambulatory Visit | Attending: Physician Assistant | Admitting: Physician Assistant

## 2019-11-02 ENCOUNTER — Other Ambulatory Visit: Payer: Self-pay

## 2019-11-02 ENCOUNTER — Other Ambulatory Visit: Payer: Self-pay | Admitting: Physician Assistant

## 2019-11-02 ENCOUNTER — Encounter: Payer: Self-pay | Admitting: *Deleted

## 2019-11-02 DIAGNOSIS — R188 Other ascites: Secondary | ICD-10-CM

## 2019-11-02 DIAGNOSIS — K668 Other specified disorders of peritoneum: Secondary | ICD-10-CM

## 2019-11-02 DIAGNOSIS — T8189XA Other complications of procedures, not elsewhere classified, initial encounter: Secondary | ICD-10-CM

## 2019-11-02 HISTORY — PX: IR RADIOLOGIST EVAL & MGMT: IMG5224

## 2019-11-02 NOTE — Progress Notes (Addendum)
Referring Physician(s): Maczis,Michael M Dr Barth Kirks Dr Truitt Merle Dr Levonne Spiller   Chief Complaint: The patient is seen in follow up today s/p Biloma drain placed 8/27 in IR   History of present illness:  Hx Pancreatic Cancer-- Biliary stent placed 8/21 Developed abd pain and fever Imaging revealed biloma/abscess Drain placed in IR 8/27 OP is milky yellow still-- significant OP remains Flushing 5 cc daily Living at [facility] Taking Augmentin BID Denies fever chills Feels well otherwise  Pt to see Dr Rosiland Oz ID tomorrow Still on Augmentin  CT scan today much improved per Dr Vernard Gambles    Past Medical History:  Diagnosis Date  . Hypertension     Past Surgical History:  Procedure Laterality Date  . BILIARY STENT PLACEMENT  05/11/2019   Procedure: BILIARY STENT PLACEMENT;  Surgeon: Irene Shipper, MD;  Location: [facility] ENDOSCOPY;  Service: Endoscopy;;  . BILIARY STENT PLACEMENT  06/03/2019   Procedure: BILIARY STENT PLACEMENT;  Surgeon: Jackquline Denmark, MD;  Location: [facility] ENDOSCOPY;  Service: Endoscopy;;  . BILIARY STENT PLACEMENT N/A 09/24/2019   Procedure: BILIARY STENT PLACEMENT;  Surgeon: Ladene Artist, MD;  Location: [facility] ENDOSCOPY;  Service: Endoscopy;  Laterality: N/A;  . ERCP N/A 05/11/2019   Procedure: ENDOSCOPIC RETROGRADE CHOLANGIOPANCREATOGRAPHY (ERCP);  Surgeon: Irene Shipper, MD;  Location: [facility] ENDOSCOPY;  Service: Endoscopy;  Laterality: N/A;  with   . ERCP N/A 06/03/2019   Procedure: ENDOSCOPIC RETROGRADE CHOLANGIOPANCREATOGRAPHY (ERCP);  Surgeon: Jackquline Denmark, MD;  Location: [facility] ENDOSCOPY;  Service: Endoscopy;  Laterality: N/A;  . ERCP N/A 09/24/2019   Procedure: ENDOSCOPIC RETROGRADE CHOLANGIOPANCREATOGRAPHY (ERCP);  Surgeon: Ladene Artist, MD;  Location: [facility] ENDOSCOPY;  Service: Endoscopy;  Laterality: N/A;  . ESOPHAGOGASTRODUODENOSCOPY (EGD) WITH PROPOFOL N/A 05/19/2019   Procedure: ESOPHAGOGASTRODUODENOSCOPY (EGD) WITH PROPOFOL;  Surgeon: [name], MD;  Location: [facility] ENDOSCOPY;  Service: Endoscopy;  Laterality: N/A;  . EUS N/A 05/19/2019   Procedure: UPPER ENDOSCOPIC ULTRASOUND (EUS) LINEAR;  Surgeon: [name], MD;  Location: [facility] ENDOSCOPY;  Service: Endoscopy;  Laterality: N/A;  . FINE NEEDLE ASPIRATION N/A 05/19/2019   Procedure: FINE NEEDLE ASPIRATION (FNA) LINEAR;  Surgeon: [name], MD;  Location: [facility] ENDOSCOPY;  Service: Endoscopy;  Laterality: N/A;  . IR IMAGING GUIDED PORT INSERTION  06/08/2019  . IR RADIOLOGIST EVAL & MGMT  10/19/2019  . REMOVAL OF STONES  09/24/2019   Procedure: REMOVAL OF STONES;  Surgeon: Ladene Artist, MD;  Location: [facility] ENDOSCOPY;  Service: Endoscopy;;  . [name]  05/11/2019   Procedure: SPHINCTEROTOMY;  Surgeon: Irene Shipper, MD;  Location: [facility] ENDOSCOPY;  Service: Endoscopy;;  . [name] REMOVAL  06/03/2019   Procedure: STENT REMOVAL;  Surgeon: Jackquline Denmark, MD;  Location: [facility] ENDOSCOPY;  Service: Endoscopy;;  . STENT REMOVAL  09/24/2019   Procedure: STENT REMOVAL;  Surgeon: Ladene Artist, MD;  Location: [facility] ENDOSCOPY;  Service: Endoscopy;;    Allergies: Patient has no known allergies.  Medications: Prior to Admission medications   Medication Sig Start Date End Date Taking? Authorizing Provider  amoxicillin-clavulanate (AUGMENTIN) 875-125 MG tablet Take 1 tablet by mouth 2 (two) times daily. 10/06/19   Antonieta Pert, MD  amoxicillin-clavulanate (AUGMENTIN) 875-125 MG tablet Take 1 tablet by mouth 2 (two) times daily. 10/20/19   Rosiland Oz, MD  atenolol (TENORMIN) 100 MG tablet Take 1 tablet (100 mg total) by mouth daily. Hold until follow-up with your PCP Patient taking differently: Take 100 mg by mouth daily.  09/28/19  Rai, Vernelle Emerald, MD  cetirizine (ZYRTEC) 10 MG tablet Take 10 mg by mouth daily.    [provider]  diphenoxylate-atropine (LOMOTIL) 2.5-0.025 MG tablet Take 1-2 tablets by mouth 4 (four) times daily as needed for diarrhea or loose stools. Patient taking  differently: Take 2 tablets by mouth 4 (four) times daily as needed for diarrhea or loose stools.  07/14/19   Truitt Merle, MD  ferrous sulfate 325 (65 FE) MG tablet Take 1 tablet (325 mg total) by mouth 2 (two) times daily with a meal. 10/06/19   Antonieta Pert, MD  guaifenesin (HUMIBID E) 400 MG TABS tablet Take 1 tablet (400 mg total) by mouth every 6 (six) hours as needed. Patient taking differently: Take 400 mg by mouth in the morning and at bedtime.  05/13/19   Geradine Girt, DO  hydrocortisone (ANUSOL-HC) 2.5 % rectal cream Apply 1 application topically 4 (four) times daily as needed for hemorrhoids. 09/28/19   Rai, Vernelle Emerald, MD  lidocaine-prilocaine (EMLA) cream Apply 1 application topically as needed. Patient taking differently: Apply 1 application topically as needed (To access the port).  06/23/19   Truitt Merle, MD  loperamide (IMODIUM) 2 MG capsule Take 1-2 capsules (2-4 mg total) by mouth every 6 (six) hours as needed for diarrhea or loose stools. 07/14/19   Truitt Merle, MD  magnesium oxide (MAG-OX) 400 (241.3 Mg) MG tablet Take 1 tablet (400 mg total) by mouth daily. Patient taking differently: Take 400 mg by mouth every other day.  05/14/19   Geradine Girt, DO  methocarbamol (ROBAXIN) 500 MG tablet Take 1 tablet (500 mg total) by mouth every 6 (six) hours as needed for muscle spasms. 09/28/19   Rai, Vernelle Emerald, MD  Nutritional Supplements (,FEEDING SUPPLEMENT, PROSOURCE PLUS) liquid Take 30 mLs by mouth 2 (two) times daily between meals. 10/06/19 11/05/19  Antonieta Pert, MD  ondansetron (ZOFRAN ODT) 4 MG disintegrating tablet Take 1 tablet (4 mg total) by mouth every 8 (eight) hours as needed for nausea or vomiting. 09/28/19   Rai, Ripudeep K, MD  pantoprazole (PROTONIX) 40 MG tablet Take 1 tablet (40 mg total) by mouth daily. 09/28/19 01/26/20  Rai, Ripudeep K, MD  potassium chloride SA (KLOR-CON) 20 MEQ tablet Take 2 tablets (40 mEq total) by mouth daily. 08/30/19   Truitt Merle, MD  traMADol (ULTRAM) 50 MG  tablet Take 1 tablet (50 mg total) by mouth every 8 (eight) hours as needed for up to 4 doses for moderate pain or severe pain. 10/06/19   Antonieta Pert, MD     Family History  Problem Relation Age of Onset  . Lymphoma Mother 48       cancer in lymph nodes, unsure of primary  . Heart attack Father   . Lupus Sister     Social History   Socioeconomic History  . Marital status: Divorced    Spouse name: Not on file  . Number of children: 1  . Years of education: Not on file  . Highest education level: Not on file  Occupational History  . Occupation: disabled veteran   Tobacco Use  . Smoking status: Never Smoker  . Smokeless tobacco: Never Used  . Tobacco comment: plan to quit completely   Substance and Sexual Activity  . Alcohol use: Yes    Alcohol/week: 22.0 standard drinks    Types: 12 Cans of beer, 10 Shots of liquor per week  . Drug use: Not Currently  . Sexual activity: Not on  file  Other Topics Concern  . Not on file  Social History Narrative  . Not on file   Social Determinants of Health   Financial Resource Strain:   . Difficulty of Paying Living Expenses: Not on file  Food Insecurity:   . Worried About Charity fundraiser in the Last Year: Not on file  . Ran Out of Food in the Last Year: Not on file  Transportation Needs:   . Lack of Transportation (Medical): Not on file  . Lack of Transportation (Non-Medical): Not on file  Physical Activity:   . Days of Exercise per Week: Not on file  . Minutes of Exercise per Session: Not on file  Stress:   . Feeling of Stress : Not on file  Social Connections:   . Frequency of Communication with Friends and Family: Not on file  . Frequency of Social Gatherings with Friends and Family: Not on file  . Attends Religious Services: Not on file  . Active Member of Clubs or Organizations: Not on file  . Attends Archivist Meetings: Not on file  . Marital Status: Not on file     Vital Signs: There were no vitals  taken for this visit.  Physical Exam Skin:    General: Skin is warm.     Comments: Site is clean and dry NT no bleeding  OP in JP milky yellow Flushes easily  Changed to gravity bag per Dr Vernard Gambles order     Imaging: No results found.  Labs:  CBC: Recent Labs    10/03/19 0615 10/04/19 0438 10/06/19 0305 10/20/19 1101  WBC 12.5* 13.5* 11.1* 7.2  HGB 7.3* 8.3* 7.7* 8.9*  HCT 23.5* 26.3* 24.5* 28.0*  PLT 174 205 260 217    COAGS: Recent Labs    06/03/19 0146 09/23/19 0930 09/24/19 0418 09/30/19 0445  INR 1.0 1.4* 1.7* 1.6*    BMP: Recent Labs    10/02/19 0318 10/02/19 0318 10/03/19 0615 10/04/19 0438 10/06/19 0305 10/20/19 1101  NA 135   < > 136 134* 134* 135  K 3.3*   < > 3.1* 3.2* 3.1* 5.0  CL 106   < > 107 106 106 102  CO2 21*   < > 22 19* 19* 26  GLUCOSE 132*   < > 140* 116* 100* 127*  BUN 18   < > 15 15 12 10   CALCIUM 7.6*   < > 7.7* 7.7* 7.6* 9.0  CREATININE 0.94   < > 0.86 0.80 0.60* 0.74  GFRNONAA >60  --  >60 >60 >60  --   GFRAA >60  --  >60 >60 >60  --    < > = values in this interval not displayed.    LIVER FUNCTION TESTS: Recent Labs    10/02/19 0318 10/02/19 0318 10/03/19 0615 10/04/19 0438 10/06/19 0305 10/20/19 1101  BILITOT 1.5*   < > 1.4* 1.4* 0.9 0.9  AST 19   < > 22 19 22  36*  ALT 15   < > 14 13 15 18   ALKPHOS 93  --  105 105 96  --   PROT 5.3*   < > 5.2* 5.4* 5.4* 6.8  ALBUMIN 1.8*  --  1.7* 1.8* 1.7*  --    < > = values in this interval not displayed.    Assessment:  Biloma drain intact Flushes easily CT show great improvement-- but drain to remain; now to gravity bag. No drain injection needed per Dr Vernard Gambles Continue  flushes 3-5 cc BID Re CT 2 weeks and follow up To see Dr [place] Bali tomorrow  Signed: Lavonia Drafts, PA-C 11/02/2019, 1:56 PM   Please refer to Dr. Vernard Gambles attestation of this note for management and plan.

## 2019-11-03 ENCOUNTER — Other Ambulatory Visit: Payer: Self-pay

## 2019-11-03 ENCOUNTER — Ambulatory Visit (INDEPENDENT_AMBULATORY_CARE_PROVIDER_SITE_OTHER): Payer: No Typology Code available for payment source | Admitting: Infectious Diseases

## 2019-11-03 ENCOUNTER — Encounter: Payer: Self-pay | Admitting: Infectious Diseases

## 2019-11-03 VITALS — BP 125/75 | HR 65

## 2019-11-03 DIAGNOSIS — Z5181 Encounter for therapeutic drug level monitoring: Secondary | ICD-10-CM

## 2019-11-03 DIAGNOSIS — R188 Other ascites: Secondary | ICD-10-CM | POA: Diagnosis not present

## 2019-11-03 NOTE — Progress Notes (Signed)
[facility]                                                             [place], [place], [place], [address]                                                                  Phn. [phone]; Fax: [phone]                                                                             Date: 11/03/19  Reason for Follow Up:  Intraabdominal abscess/biloma   Assessment 1. RT Upper Quadrant Abscess ( ? Biloma) in the setting of known h/o pancreatic ca and stent placement - Abscess cultures growing Streptococcus constellatus   2. Medication Monitoring - On PO Augmentin. Tolerating well   Plan Recent CT abd/pelvis 90/29 with decrease in the size of perihepatic collection. Drain catheter is well positioned. Patient is tolerating Augmentin will with no side effects.   I think antibiotics can be discontinued at this point given patient has a well positioned drainage catheter with interval decrease in the size of collection in serial CT( source control).   Patient will follow up with IR for removal of the pigtail  I discussed with patient regarding symptoms or sign concerning for infection and he knows to call me if anything changes or worsens. Fu prn   All questions and concerns were discussed and addressed  I spent greater than 60 minutes with the patient including greater than 50% of time in face to face counsel of the patient and in coordination of their care.   Rosiland Oz, MD [facility]  Office phone [phone] Fax no. [phone] ______________________________________________________________________________________________________________________ Subjective Patient is accompanied by his grandson today. Taking PO Augmentin twice a day.. Denies any side effects with the antibiotics like nausea, vomiting, diarrhea. He denies abdominal pain. He  had a repeat CT scan yesterday which showed the previously seen perihepatic fluid collection is decreasing. He is going to follow up with IR for removal of pigtail. No other questions or concerns.   Past Medical History:  Diagnosis Date   Hypertension    Past Surgical History:  Procedure Laterality Date   BILIARY STENT PLACEMENT  05/11/2019   Procedure: BILIARY STENT PLACEMENT;  Surgeon: Irene Shipper, MD;  Location: [facility]  ENDOSCOPY;  Service: Endoscopy;;   BILIARY STENT PLACEMENT  06/03/2019   Procedure: BILIARY STENT PLACEMENT;  Surgeon: Jackquline Denmark, MD;  Location: [facility] ENDOSCOPY;  Service: Endoscopy;;   BILIARY STENT PLACEMENT N/A 09/24/2019   Procedure: BILIARY STENT PLACEMENT;  Surgeon: Ladene Artist, MD;  Location: [facility] ENDOSCOPY;  Service: Endoscopy;  Laterality: N/A;   ERCP N/A 05/11/2019   Procedure: ENDOSCOPIC RETROGRADE CHOLANGIOPANCREATOGRAPHY (ERCP);  Surgeon: Irene Shipper, MD;  Location: [facility] ENDOSCOPY;  Service: Endoscopy;  Laterality: N/A;  with    ERCP N/A 06/03/2019   Procedure: ENDOSCOPIC RETROGRADE CHOLANGIOPANCREATOGRAPHY (ERCP);  Surgeon: Jackquline Denmark, MD;  Location: [facility] ENDOSCOPY;  Service: Endoscopy;  Laterality: N/A;   ERCP N/A 09/24/2019   Procedure: ENDOSCOPIC RETROGRADE CHOLANGIOPANCREATOGRAPHY (ERCP);  Surgeon: Ladene Artist, MD;  Location: [facility] ENDOSCOPY;  Service: Endoscopy;  Laterality: N/A;   ESOPHAGOGASTRODUODENOSCOPY (EGD) WITH PROPOFOL N/A 05/19/2019   Procedure: ESOPHAGOGASTRODUODENOSCOPY (EGD) WITH PROPOFOL;  Surgeon: [name], MD;  Location: [facility] ENDOSCOPY;  Service: Endoscopy;  Laterality: N/A;   EUS N/A 05/19/2019   Procedure: UPPER ENDOSCOPIC ULTRASOUND (EUS) LINEAR;  Surgeon: [name], MD;  Location: [facility] ENDOSCOPY;  Service: Endoscopy;  Laterality: N/A;   FINE NEEDLE ASPIRATION N/A 05/19/2019   Procedure: FINE NEEDLE ASPIRATION (FNA) LINEAR;  Surgeon: [name], MD;  Location: [facility] ENDOSCOPY;  Service: Endoscopy;  Laterality: N/A;    IR IMAGING GUIDED PORT INSERTION  06/08/2019   IR RADIOLOGIST EVAL & MGMT  10/19/2019   IR RADIOLOGIST EVAL & MGMT  11/02/2019   REMOVAL OF STONES  09/24/2019   Procedure: REMOVAL OF STONES;  Surgeon: Ladene Artist, MD;  Location: [facility] ENDOSCOPY;  Service: Endoscopy;;   SPHINCTEROTOMY  05/11/2019   Procedure: [name];  Surgeon: Irene Shipper, MD;  Location[facility] ENDOSCOPY;  Service: Endoscopy;;   STENT REMOVAL  06/03/2019   Procedure: STENT REMOVAL;  Surgeon: Jackquline Denmark, MD;  Location: [place];  Service: Endoscopy;;   STENT REMOVAL  09/24/2019   Procedure: STENT REMOVAL;  Surgeon: Ladene Artist, MD;  Location: [facility] ENDOSCOPY;  Service: Endoscopy;;   Current Outpatient Medications on File Prior to Visit  Medication Sig Dispense Refill   amoxicillin-clavulanate (AUGMENTIN) 875-125 MG tablet Take 1 tablet by mouth 2 (two) times daily.  0   amoxicillin-clavulanate (AUGMENTIN) 875-125 MG tablet Take 1 tablet by mouth 2 (two) times daily. 28 tablet 2   cetirizine (ZYRTEC) 10 MG tablet Take 10 mg by mouth daily.     ferrous sulfate 325 (65 FE) MG tablet Take 1 tablet (325 mg total) by mouth 2 (two) times daily with a meal.  3   hydrocortisone (ANUSOL-HC) 2.5 % rectal cream Apply 1 application topically 4 (four) times daily as needed for hemorrhoids. 30 g 2   loperamide (IMODIUM) 2 MG capsule Take 1-2 capsules (2-4 mg total) by mouth every 6 (six) hours as needed for diarrhea or loose stools. 90 capsule 1   methocarbamol (ROBAXIN) 500 MG tablet Take 1 tablet (500 mg total) by mouth every 6 (six) hours as needed for muscle spasms. 90 tablet 0   Nutritional Supplements (,FEEDING SUPPLEMENT, PROSOURCE PLUS) liquid Take 30 mLs by mouth 2 (two) times daily between meals. 1800 mL 0   ondansetron (ZOFRAN ODT) 4 MG disintegrating tablet Take 1 tablet (4 mg total) by mouth every 8 (eight) hours as needed for nausea or vomiting. 20 tablet 0   pantoprazole (PROTONIX) 40 MG tablet Take 1  tablet (40 mg total) by mouth daily.  30 tablet 3   potassium chloride SA (KLOR-CON) 20 MEQ tablet Take 2 tablets (40 mEq total) by mouth daily. 28 tablet 1   traMADol (ULTRAM) 50 MG tablet Take 1 tablet (50 mg total) by mouth every 8 (eight) hours as needed for up to 4 doses for moderate pain or severe pain. 4 tablet 0   atenolol (TENORMIN) 100 MG tablet Take 1 tablet (100 mg total) by mouth daily. Hold until follow-up with your PCP (Patient not taking: Reported on 11/03/2019) 30 tablet 0   diphenoxylate-atropine (LOMOTIL) 2.5-0.025 MG tablet Take 1-2 tablets by mouth 4 (four) times daily as needed for diarrhea or loose stools. (Patient not taking: Reported on 11/03/2019) 90 tablet 0   guaifenesin (HUMIBID E) 400 MG TABS tablet Take 1 tablet (400 mg total) by mouth every 6 (six) hours as needed. (Patient not taking: Reported on 11/03/2019) 84 tablet    lidocaine-prilocaine (EMLA) cream Apply 1 application topically as needed. (Patient not taking: Reported on 11/03/2019) 30 g 0   magnesium oxide (MAG-OX) 400 (241.3 Mg) MG tablet Take 1 tablet (400 mg total) by mouth daily. (Patient not taking: Reported on 11/03/2019)     No current facility-administered medications on file prior to visit.   No Known Allergies  Social History   Socioeconomic History   Marital status: Divorced    Spouse name: Not on file   Number of children: 1   Years of education: Not on file   Highest education level: Not on file  Occupational History   Occupation: disabled veteran   Tobacco Use   Smoking status: Never Smoker   Smokeless tobacco: Never Used   Tobacco comment: plan to quit completely   Substance and Sexual Activity   Alcohol use: Yes    Alcohol/week: 22.0 standard drinks    Types: 12 Cans of beer, 10 Shots of liquor per week   Drug use: Not Currently   Sexual activity: Not on file  Other Topics Concern   Not on file  Social History Narrative   Not on file   Social Determinants of  Health   Financial Resource Strain:    Difficulty of Paying Living Expenses: Not on file  Food Insecurity:    Worried About S[place]d in the Last Year: Not on file   Ran Out of Food in the Last Year: Not on file  Transportation Needs:    Lack of Transportation (Medical): Not on file   Lack of Transportation (Non-Medical): Not on file  Physical Activity:    Days of Exercise per Week: Not on file   Minutes of Exercise per Session: Not on file  Stress:    Feeling of Stress : Not on file  Social Connections:    Frequency of Communication with Friends and Family: Not on file   Frequency of Social Gatherings with Friends and Family: Not on file   Attends Religious Services: Not on file   Active Member of Clubs or Organizations: Not on file   Attends Archivist Meetings: Not on file   Marital Status: Not on file  Intimate Partner Violence:    Fear of Current or Ex-Partner: Not on file   Emotionally Abused: Not on file   Physically Abused: Not on file   Sexually Abused: Not on file   Family History  Problem Relation Age of Onset   Lymphoma Mother 63       cancer in lymph nodes, unsure of primary   Heart attack  Father    Lupus Sister     Vitals BP 125/75    Pulse 65    SpO2 100%    Examination  General - not in acute distress, comfortably sitting in chair HEENT - PEERLA, no pallor and no icterus, multiple missing teeth, dental caries  Chest - b/l clear air entry, no additional sounds CVS- Normal s1s2, RRR Abdomen - Soft, Non tender , non distended, RUQ has  Clean bandage overlying the drain site - drainage bulb is fempty Ext- no pedal edema Neuro: grossly normal Back - WNL Psych : calm and cooperative   Recent labs CBC Latest Ref Rng & Units 10/20/2019 10/06/2019 10/04/2019  WBC 3.8 - 10.8 Thousand/uL 7.2 11.1(H) 13.5(H)  Hemoglobin 13.2 - 17.1 g/dL 8.9(L) 7.7(L) 8.3(L)  Hematocrit 38 - 50 % 28.0(L) 24.5(L) 26.3(L)  Platelets 140 -  400 Thousand/uL 217 260 205   CMP Latest Ref Rng & Units 10/20/2019 10/06/2019 10/04/2019  Glucose 65 - 99 mg/dL 127(H) 100(H) 116(H)  BUN 7 - 25 mg/dL 10 12 15   Creatinine 0.70 - 1.25 mg/dL 0.74 0.60(L) 0.80  Sodium 135 - 146 mmol/L 135 134(L) 134(L)  Potassium 3.5 - 5.3 mmol/L 5.0 3.1(L) 3.2(L)  Chloride 98 - 110 mmol/L 102 106 106  CO2 20 - 32 mmol/L 26 19(L) 19(L)  Calcium 8.6 - 10.3 mg/dL 9.0 7.6(L) 7.7(L)  Total Protein 6.1 - 8.1 g/dL 6.8 5.4(L) 5.4(L)  Total Bilirubin 0.2 - 1.2 mg/dL 0.9 0.9 1.4(H)  Alkaline Phos 38 - 126 U/L - 96 105  AST 10 - 35 U/L 36(H) 22 19  ALT 9 - 46 U/L 18 15 13     Pertinent Microbiology Results for orders placed or performed during the hospital encounter of 09/30/19  SARS Coronavirus 2 by RT PCR (hospital order, performed in [facility] hospital lab) Nasopharyngeal Nasopharyngeal Swab     Status: None   Collection Time: 09/30/19  7:44 AM   Specimen: Nasopharyngeal Swab  Result Value Ref Range Status   SARS Coronavirus 2 NEGATIVE NEGATIVE Final    Comment: (NOTE) SARS-CoV-2 target nucleic acids are NOT DETECTED.  The SARS-CoV-2 RNA is generally detectable in upper and lower respiratory specimens during the acute phase of infection. The lowest concentration of SARS-CoV-2 viral copies this assay can detect is 250 copies / mL. A negative result does not preclude SARS-CoV-2 infection and should not be used as the sole basis for treatment or other patient management decisions.  A negative result may occur with improper specimen collection / handling, submission of specimen other than nasopharyngeal swab, presence of viral mutation(s) within the areas targeted by this assay, and inadequate number of viral copies (<250 copies / mL). A negative result must be combined with clinical observations, patient history, and epidemiological information.  Fact Sheet for Patients:   StrictlyIdeas.no  Fact Sheet for Healthcare  Providers: BankingDealers.co.za  This test is not yet approved or  cleared by the [place] FDA and has been authorized for detection and/or diagnosis of SARS-CoV-2 by FDA under an Emergency Use Authorization (EUA).  This EUA will remain in effect (meaning this test can be used) for the duration of the COVID-19 declaration under Section 564(b)(1) of the Act, 21 U.S.C. section 360bbb-3(b)(1), unless the authorization is terminated or revoked sooner.  Performed at [facility], [place] [address], [place], [place] [address]   Aerobic/Anaerobic Culture (surgical/deep wound)     Status: None   Collection Time: 09/30/19 12:26 PM   Specimen: Abdomen;  Abscess  Result Value Ref Range Status   Specimen Description   Final    ABDOMEN ABSCESS Performed at [place] [address] [place], [place] [address]    Special Requests   Final    NONE Performed at [facility], [place] [address], [place], [place] [address]    Gram Stain   Final    ABUNDANT WBC PRESENT,BOTH PMN AND MONONUCLEAR NO ORGANISMS SEEN    Culture   Final    RARE STREPTOCOCCUS CONSTELLATUS NO ANAEROBES ISOLATED Performed at [facility], [place] [address], [place], [place] [address]    Report Status 10/06/2019 FINAL  Final   Organism ID, Bacteria STREPTOCOCCUS CONSTELLATUS  Final      Susceptibility   Streptococcus constellatus - MIC*    PENICILLIN INTERMEDIATE Intermediate     CEFTRIAXONE 1 SENSITIVE Sensitive     ERYTHROMYCIN <=0.12 SENSITIVE Sensitive     LEVOFLOXACIN 0.5 SENSITIVE Sensitive     VANCOMYCIN 0.5 SENSITIVE Sensitive     AMPICILLIN Value in next row Sensitive      SENSITIVEMIC =0.25 RELEASE PER DR REQUEST    * RARE STREPTOCOCCUS CONSTELLATUS  SARS CORONAVIRUS 2 (TAT 6-24 HRS) Nasopharyngeal Nasopharyngeal Swab     Status: None   Collection Time: 10/04/19  4:41 PM   Specimen: Nasopharyngeal Swab  Result Value Ref  Range Status   SARS Coronavirus 2 NEGATIVE NEGATIVE Final    Comment: (NOTE) SARS-CoV-2 target nucleic acids are NOT DETECTED.  The SARS-CoV-2 RNA is generally detectable in upper and lower respiratory specimens during the acute phase of infection. Negative results do not preclude SARS-CoV-2 infection, do not rule out co-infections with other pathogens, and should not be used as the sole basis for treatment or other patient management decisions. Negative results must be combined with clinical observations, patient history, and epidemiological information. The expected result is Negative.  Fact Sheet for Patients: SugarRoll.be  Fact Sheet for Healthcare Providers: https://www.woods-mathews.com/  This test is not yet approved or cleared by the [place] FDA and  has been authorized for detection and/or diagnosis of SARS-CoV-2 by FDA under an Emergency Use Authorization (EUA). This EUA will remain  in effect (meaning this test can be used) for the duration of the COVID-19 declaration under Se ction 564(b)(1) of the Act, 21 U.S.C. section 360bbb-3(b)(1), unless the authorization is terminated or revoked sooner.  Performed at [facility], [place] [address], [place], [place] [address]     Pertinent Imaging CT abdomen/pelvis WOO contrast 11/02/19  FINDINGS: Lower chest: No pleural or pericardial effusion. Visualized lung bases clear.  Hepatobiliary: Metallic stent in the CBD. Scattered pneumobilia consistent with stent patency. Multiple partially calcified subcentimeter stones in the dependent aspect of the nondilated gallbladder. No focal liver lesion.  Interval decrease in size of lateral perihepatic collection, with drain catheter well positioned. Residual crescentic collection measuring approximately 8.3 x 1.2 cm. Interval improvement of the mass effect upon the liver. No new collections.  Pancreas: Unremarkable. No  pancreatic ductal dilatation or surrounding inflammatory changes.  Spleen: Normal in size without focal abnormality.  Adrenals/Urinary Tract: Adrenal glands unremarkable. Left renal cystic lesions stable. No hydronephrosis. Urinary bladder incompletely distended.  Stomach/Bowel: Stomach partially distended by ingested material. The small bowel is nondilated. Moderate proximal colonic fecal material without dilatation, decompressed distally.  Vascular/Lymphatic: Scattered aortoiliac calcified atheromatous plaque without aneurysm. No abdominal or pelvic adenopathy.  Reproductive: Prostate is unremarkable.  Other: Bilateral pelvic phleboliths. Small volume abdominal  ascites, stable. No free air.  Musculoskeletal: Stable T12 compression fracture deformity. Lower lumbar spondylitic changes. No acute fracture or worrisome bone lesion.  IMPRESSION: 1. Interval decrease in size of lateral perihepatic collection, with drain catheter well positioned. 2. Stable small volume abdominal ascites. 3. Cholelithiasis.  Aortic Atherosclerosis (ICD10-I70.0).  All pertinent labs/Imagings/notes reviewed. All pertinent plain films and CT images have been personally visualized and interpreted; radiology reports have been reviewed. Decision making incorporated into the Impression / Recommendations.
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
## 2019-11-10 ENCOUNTER — Other Ambulatory Visit: Payer: Self-pay

## 2019-11-10 ENCOUNTER — Inpatient Hospital Stay: Payer: No Typology Code available for payment source | Attending: Hematology | Admitting: Hematology

## 2019-11-10 ENCOUNTER — Inpatient Hospital Stay: Payer: No Typology Code available for payment source

## 2019-11-10 ENCOUNTER — Encounter: Payer: Self-pay | Admitting: Hematology

## 2019-11-10 VITALS — BP 101/69 | HR 80 | Temp 99.2°F | Resp 18 | Ht 71.0 in | Wt 151.3 lb

## 2019-11-10 DIAGNOSIS — C250 Malignant neoplasm of head of pancreas: Secondary | ICD-10-CM

## 2019-11-10 DIAGNOSIS — Z95828 Presence of other vascular implants and grafts: Secondary | ICD-10-CM

## 2019-11-10 DIAGNOSIS — Z452 Encounter for adjustment and management of vascular access device: Secondary | ICD-10-CM | POA: Diagnosis not present

## 2019-11-10 DIAGNOSIS — C258 Malignant neoplasm of overlapping sites of pancreas: Secondary | ICD-10-CM | POA: Diagnosis present

## 2019-11-10 DIAGNOSIS — I10 Essential (primary) hypertension: Secondary | ICD-10-CM | POA: Diagnosis not present

## 2019-11-10 DIAGNOSIS — D630 Anemia in neoplastic disease: Secondary | ICD-10-CM | POA: Insufficient documentation

## 2019-11-10 LAB — CBC WITH DIFFERENTIAL (CANCER CENTER ONLY)
Abs Immature Granulocytes: 0.03 10*3/uL (ref 0.00–0.07)
Basophils Absolute: 0 10*3/uL (ref 0.0–0.1)
Basophils Relative: 0 %
Eosinophils Absolute: 0 10*3/uL (ref 0.0–0.5)
Eosinophils Relative: 0 %
HCT: 30.9 % — ABNORMAL LOW (ref 39.0–52.0)
Hemoglobin: 9.5 g/dL — ABNORMAL LOW (ref 13.0–17.0)
Immature Granulocytes: 0 %
Lymphocytes Relative: 20 %
Lymphs Abs: 1.4 10*3/uL (ref 0.7–4.0)
MCH: 26.3 pg (ref 26.0–34.0)
MCHC: 30.7 g/dL (ref 30.0–36.0)
MCV: 85.6 fL (ref 80.0–100.0)
Monocytes Absolute: 0.4 10*3/uL (ref 0.1–1.0)
Monocytes Relative: 6 %
Neutro Abs: 4.9 10*3/uL (ref 1.7–7.7)
Neutrophils Relative %: 74 %
Platelet Count: 140 10*3/uL — ABNORMAL LOW (ref 150–400)
RBC: 3.61 MIL/uL — ABNORMAL LOW (ref 4.22–5.81)
RDW: 17.3 % — ABNORMAL HIGH (ref 11.5–15.5)
WBC Count: 6.8 10*3/uL (ref 4.0–10.5)
nRBC: 0 % (ref 0.0–0.2)

## 2019-11-10 LAB — CMP (CANCER CENTER ONLY)
ALT: 57 U/L — ABNORMAL HIGH (ref 0–44)
AST: 166 U/L — ABNORMAL HIGH (ref 15–41)
Albumin: 2.5 g/dL — ABNORMAL LOW (ref 3.5–5.0)
Alkaline Phosphatase: 519 U/L — ABNORMAL HIGH (ref 38–126)
Anion gap: 6 (ref 5–15)
BUN: 8 mg/dL (ref 8–23)
CO2: 27 mmol/L (ref 22–32)
Calcium: 8.9 mg/dL (ref 8.9–10.3)
Chloride: 102 mmol/L (ref 98–111)
Creatinine: 0.75 mg/dL (ref 0.61–1.24)
GFR, Estimated: >60 mL/min (ref >60)
Glucose, Bld: 121 mg/dL — ABNORMAL HIGH (ref 70–99)
Potassium: 3.5 mmol/L (ref 3.5–5.1)
Sodium: 135 mmol/L (ref 135–145)
Total Bilirubin: 2.5 mg/dL — ABNORMAL HIGH (ref 0.3–1.2)
Total Protein: 7 g/dL (ref 6.5–8.1)

## 2019-11-10 MED ORDER — HEPARIN SOD (PORK) LOCK FLUSH 100 UNIT/ML IV SOLN
500.0000 [IU] | Freq: Once | INTRAVENOUS | Status: AC
  Administered 2019-11-10: 500 [IU]
  Filled 2019-11-10: qty 5

## 2019-11-10 MED ORDER — SODIUM CHLORIDE 0.9% FLUSH
10.0000 mL | Freq: Once | INTRAVENOUS | Status: AC
  Administered 2019-11-10: 10 mL
  Filled 2019-11-10: qty 10

## 2019-11-11 ENCOUNTER — Telehealth: Payer: Self-pay | Admitting: Hematology

## 2019-11-11 NOTE — Telephone Encounter (Signed)
Scheduled per 10/7 los. Spoke with pt's wife and is aware of appt times and date.

## 2019-11-15 LAB — CANCER ANTIGEN 19-9: CA 19-9: 6199 U/mL — ABNORMAL HIGH (ref 0–35)

## 2019-11-16 ENCOUNTER — Ambulatory Visit
Admission: RE | Admit: 2019-11-16 | Discharge: 2019-11-16 | Disposition: A | Discharge status: Home / Self Care | Payer: Medicare Other | Source: Ambulatory Visit | Attending: Physician Assistant | Admitting: Physician Assistant

## 2019-11-16 ENCOUNTER — Encounter: Payer: Self-pay | Admitting: *Deleted

## 2019-11-16 DIAGNOSIS — R188 Other ascites: Secondary | ICD-10-CM

## 2019-11-16 DIAGNOSIS — T8189XA Other complications of procedures, not elsewhere classified, initial encounter: Secondary | ICD-10-CM

## 2019-11-16 DIAGNOSIS — K668 Other specified disorders of peritoneum: Secondary | ICD-10-CM

## 2019-11-16 HISTORY — PX: IR RADIOLOGIST EVAL & MGMT: IMG5224

## 2019-11-16 MED ORDER — IOPAMIDOL (ISOVUE-300) INJECTION 61%
100.0000 mL | Freq: Once | INTRAVENOUS | Status: AC | PRN
  Administered 2019-11-16: 100 mL via INTRAVENOUS

## 2019-11-16 NOTE — Progress Notes (Signed)
Chief Complaint: Biloma drain placed 8/27   Referring Physician(s): Maczis,Michael M Dr Barth Kirks Dr Truitt Merle Dr Levonne Spiller  Supervising Physician: Ruthann Cancer  History of Present Illness: Isaac Doyle is a 64 y.o. male with history of pancreatic carcinoma with previous biliary stent placement in April of this year.   He was admitted [facility] secondary to sepsis/cholangitis and underwent biliary stent exchange on 09/24/2019.   He was discharged home on 8/25.   He presented again to [facility] ED with persistent abdominal pain.  CT of the abdomen pelvis 8/27 showed= 20 x 6 x 15 cm peritoneal or subcapsular organized collection along the right liver with a few bubbles of gas. The collection has developed rapidly and is likely an abscess or biloma.  He underwent placement of a RUQ drain by Dr. Earleen Newport on 8/27.  He was seen here in IR clinic on 9/27.   CT on that day showed decrease in size of perihepatic biloma with moderate residual collection remaining.   There was still decent fluid output from drain and the plan was to maintain to bulb suction and repeat CT in about 2 weeks.  He has returned to clinic today for repeat CT and evaluation of the drain.  He reports minimal output from the drain. He continues to flush it BID with 3-5 mL NS.  He denies fever,chills, RUQ pain.  Past Medical History:  Diagnosis Date  . Hypertension     Past Surgical History:  Procedure Laterality Date  . BILIARY STENT PLACEMENT  05/11/2019   Procedure: BILIARY STENT PLACEMENT;  Surgeon: Irene Shipper, MD;  Location: [facility] ENDOSCOPY;  Service: Endoscopy;;  . BILIARY STENT PLACEMENT  06/03/2019   Procedure: BILIARY STENT PLACEMENT;  Surgeon: Jackquline Denmark, MD;  Location: [facility] ENDOSCOPY;  Service: Endoscopy;;  . BILIARY STENT PLACEMENT N/A 09/24/2019   Procedure: BILIARY STENT PLACEMENT;  Surgeon: Ladene Artist, MD;  Location: [facility] ENDOSCOPY;  Service: Endoscopy;   Laterality: N/A;  . ERCP N/A 05/11/2019   Procedure: ENDOSCOPIC RETROGRADE CHOLANGIOPANCREATOGRAPHY (ERCP);  Surgeon: Irene Shipper, MD;  Location: [facility] ENDOSCOPY;  Service: Endoscopy;  Laterality: N/A;  with   . ERCP N/A 06/03/2019   Procedure: ENDOSCOPIC RETROGRADE CHOLANGIOPANCREATOGRAPHY (ERCP);  Surgeon: Jackquline Denmark, MD;  Location: [facility] ENDOSCOPY;  Service: Endoscopy;  Laterality: N/A;  . ERCP N/A 09/24/2019   Procedure: ENDOSCOPIC RETROGRADE CHOLANGIOPANCREATOGRAPHY (ERCP);  Surgeon: Ladene Artist, MD;  Location: [facility] ENDOSCOPY;  Service: Endoscopy;  Laterality: N/A;  . ESOPHAGOGASTRODUODENOSCOPY (EGD) WITH PROPOFOL N/A 05/19/2019   Procedure: ESOPHAGOGASTRODUODENOSCOPY (EGD) WITH PROPOFOL;  Surgeon: [name], MD;  Location: [facility] ENDOSCOPY;  Service: Endoscopy;  Laterality: N/A;  . EUS N/A 05/19/2019   Procedure: UPPER ENDOSCOPIC ULTRASOUND (EUS) LINEAR;  Surgeon: [name], MD;  Location: [facility] ENDOSCOPY;  Service: Endoscopy;  Laterality: N/A;  . FINE NEEDLE ASPIRATION N/A 05/19/2019   Procedure: FINE NEEDLE ASPIRATION (FNA) LINEAR;  Surgeon: [name], MD;  Location: [facility] ENDOSCOPY;  Service: Endoscopy;  Laterality: N/A;  . IR IMAGING GUIDED PORT INSERTION  06/08/2019  . IR RADIOLOGIST EVAL & MGMT  10/19/2019  . IR RADIOLOGIST EVAL & MGMT  11/02/2019  . REMOVAL OF STONES  09/24/2019   Procedure: REMOVAL OF STONES;  Surgeon: Ladene Artist, MD;  Location: [facility] ENDOSCOPY;  Service: Endoscopy;;  . [name]  05/11/2019   Procedure: [name];  Surgeon: Irene Shipper, MD;  Location: [facility] ENDOSCOPY;  Service: Endoscopy;;  . [name]  REMOVAL  06/03/2019   Procedure: STENT REMOVAL;  Surgeon: Jackquline Denmark, MD;  Location: [facility] ENDOSCOPY;  Service: Endoscopy;;  . [name] REMOVAL  09/24/2019   Procedure: STENT REMOVAL;  Surgeon: Ladene Artist, MD;  Location: [facility] ENDOSCOPY;  Service: Endoscopy;;    Allergies: Patient has no known allergies.  Medications: Prior to Admission medications     Medication Sig Start Date End Date Taking? Authorizing Provider  amoxicillin-clavulanate (AUGMENTIN) 875-125 MG tablet Take 1 tablet by mouth 2 (two) times daily. 10/06/19   Antonieta Pert, MD  amoxicillin-clavulanate (AUGMENTIN) 875-125 MG tablet Take 1 tablet by mouth 2 (two) times daily. 10/20/19   Rosiland Oz, MD  atenolol (TENORMIN) 100 MG tablet Take 1 tablet (100 mg total) by mouth daily. Hold until follow-up with your PCP Patient not taking: Reported on 11/03/2019 09/28/19   Rai, Vernelle Emerald, MD  cetirizine (ZYRTEC) 10 MG tablet Take 10 mg by mouth daily.    [provider]  diphenoxylate-atropine (LOMOTIL) 2.5-0.025 MG tablet Take 1-2 tablets by mouth 4 (four) times daily as needed for diarrhea or loose stools. Patient not taking: Reported on 11/03/2019 07/14/19   Truitt Merle, MD  ferrous sulfate 325 (65 FE) MG tablet Take 1 tablet (325 mg total) by mouth 2 (two) times daily with a meal. 10/06/19   Antonieta Pert, MD  guaifenesin (HUMIBID E) 400 MG TABS tablet Take 1 tablet (400 mg total) by mouth every 6 (six) hours as needed. Patient not taking: Reported on 11/03/2019 05/13/19   Geradine Girt, DO  hydrocortisone (ANUSOL-HC) 2.5 % rectal cream Apply 1 application topically 4 (four) times daily as needed for hemorrhoids. 09/28/19   Rai, Vernelle Emerald, MD  lidocaine-prilocaine (EMLA) cream Apply 1 application topically as needed. Patient not taking: Reported on 11/03/2019 06/23/19   Truitt Merle, MD  loperamide (IMODIUM) 2 MG capsule Take 1-2 capsules (2-4 mg total) by mouth every 6 (six) hours as needed for diarrhea or loose stools. 07/14/19   Truitt Merle, MD  magnesium oxide (MAG-OX) 400 (241.3 Mg) MG tablet Take 1 tablet (400 mg total) by mouth daily. Patient not taking: Reported on 11/03/2019 05/14/19   Geradine Girt, DO  methocarbamol (ROBAXIN) 500 MG tablet Take 1 tablet (500 mg total) by mouth every 6 (six) hours as needed for muscle spasms. 09/28/19   Rai, Ripudeep Raliegh Ip, MD  ondansetron (ZOFRAN ODT) 4  MG disintegrating tablet Take 1 tablet (4 mg total) by mouth every 8 (eight) hours as needed for nausea or vomiting. 09/28/19   Rai, Ripudeep K, MD  pantoprazole (PROTONIX) 40 MG tablet Take 1 tablet (40 mg total) by mouth daily. 09/28/19 01/26/20  Rai, Ripudeep K, MD  potassium chloride SA (KLOR-CON) 20 MEQ tablet Take 2 tablets (40 mEq total) by mouth daily. 08/30/19   Truitt Merle, MD  traMADol (ULTRAM) 50 MG tablet Take 1 tablet (50 mg total) by mouth every 8 (eight) hours as needed for up to 4 doses for moderate pain or severe pain. 10/06/19   Antonieta Pert, MD     Family History  Problem Relation Age of Onset  . Lymphoma Mother 9       cancer in lymph nodes, unsure of primary  . Heart attack Father   . Lupus Sister     Social History   Socioeconomic History  . Marital status: Divorced    Spouse name: Not on file  . Number of children: 1  . Years of education: Not on file  . Highest  education level: Not on file  Occupational History  . Occupation: disabled veteran   Tobacco Use  . Smoking status: Never Smoker  . Smokeless tobacco: Never Used  . Tobacco comment: plan to quit completely   Substance and Sexual Activity  . Alcohol use: Yes    Alcohol/week: 22.0 standard drinks    Types: 12 Cans of beer, 10 Shots of liquor per week  . Drug use: Not Currently  . Sexual activity: Not on file  Other Topics Concern  . Not on file  Social History Narrative  . Not on file   Social Determinants of Health   Financial Resource Strain:   . Difficulty of Paying Living Expenses: Not on file  Food Insecurity:   . Worried About Charity fundraiser in the Last Year: Not on file  . Ran Out of Food in the Last Year: Not on file  Transportation Needs:   . Lack of Transportation (Medical): Not on file  . Lack of Transportation (Non-Medical): Not on file  Physical Activity:   . Days of Exercise per Week: Not on file  . Minutes of Exercise per Session: Not on file  Stress:   . Feeling of Stress  : Not on file  Social Connections:   . Frequency of Communication with Friends and Family: Not on file  . Frequency of Social Gatherings with Friends and Family: Not on file  . Attends Religious Services: Not on file  . Active Member of Clubs or Organizations: Not on file  . Attends Archivist Meetings: Not on file  . Marital Status: Not on file     Review of Systems: A 12 point ROS discussed and pertinent positives are indicated in the HPI above.  All other systems are negative.  Review of Systems  Vital Signs: There were no vitals taken for this visit.  Physical Exam Constitutional:      Appearance: Normal appearance.  HENT:     Head: Normocephalic and atraumatic.  Cardiovascular:     Rate and Rhythm: Normal rate.  Pulmonary:     Effort: Pulmonary effort is normal. No respiratory distress.  Abdominal:     Palpations: Abdomen is soft.     Tenderness: There is no abdominal tenderness.     Comments: RUQ drain in place  Skin:    General: Skin is warm and dry.  Neurological:     General: No focal deficit present.     Mental Status: He is alert and oriented to person, place, and time.  Psychiatric:        Mood and Affect: Mood normal.        Behavior: Behavior normal.        Thought Content: Thought content normal.        Judgment: Judgment normal.        Imaging: CT ABDOMEN PELVIS WO CONTRAST  Result Date: 11/02/2019 CLINICAL DATA:  Pancreatic carcinoma with development of large loculated perihepatic collection, status post percutaneous drainage under CT guidance 09/30/2019. Follow-up CT 10/19/2019 showed decreasing size of presumed biloma with moderate residual, drain well positioned. He presents today for follow-up, continued output from the drain. EXAM: CT ABDOMEN AND PELVIS WITHOUT CONTRAST TECHNIQUE: Multidetector CT imaging of the abdomen and pelvis was performed following the standard protocol without IV contrast. COMPARISON:  10/19/2019 FINDINGS: Lower  chest: No pleural or pericardial effusion. Visualized lung bases clear. Hepatobiliary: Metallic stent in the CBD. Scattered pneumobilia consistent with stent patency. Multiple partially calcified subcentimeter stones  in the dependent aspect of the nondilated gallbladder. No focal liver lesion. Interval decrease in size of lateral perihepatic collection, with drain catheter well positioned. Residual crescentic collection measuring approximately 8.3 x 1.2 cm. Interval improvement of the mass effect upon the liver. No new collections. Pancreas: Unremarkable. No pancreatic ductal dilatation or surrounding inflammatory changes. Spleen: Normal in size without focal abnormality. Adrenals/Urinary Tract: Adrenal glands unremarkable. Left renal cystic lesions stable. No hydronephrosis. Urinary bladder incompletely distended. Stomach/Bowel: Stomach partially distended by ingested material. The small bowel is nondilated. Moderate proximal colonic fecal material without dilatation, decompressed distally. Vascular/Lymphatic: Scattered aortoiliac calcified atheromatous plaque without aneurysm. No abdominal or pelvic adenopathy. Reproductive: Prostate is unremarkable. Other: Bilateral pelvic phleboliths. Small volume abdominal ascites, stable. No free air. Musculoskeletal: Stable T12 compression fracture deformity. Lower lumbar spondylitic changes. No acute fracture or worrisome bone lesion. IMPRESSION: 1. Interval decrease in size of lateral perihepatic collection, with drain catheter well positioned. 2. Stable small volume abdominal ascites. 3. Cholelithiasis. Aortic Atherosclerosis (ICD10-I70.0). Electronically Signed   By: Lucrezia Europe M.D.   On: 11/02/2019 16:06   CT ABDOMEN PELVIS WO CONTRAST  Result Date: 10/19/2019 CLINICAL DATA:  Status post percutaneous catheter drainage of perihepatic biloma 09/30/2019. History of pancreatic carcinoma prior common bile duct stenting. EXAM: CT ABDOMEN AND PELVIS WITHOUT CONTRAST  TECHNIQUE: Multidetector CT imaging of the abdomen and pelvis was performed following the standard protocol without IV contrast. COMPARISON:  09/30/2019 FINDINGS: Lower chest: Trace left pleural effusion. Hepatobiliary: Stable pneumobilia and positioning common bile duct metallic stent. Stable calcified calculi in the gallbladder. Gallbladder distension slightly improved. Size of the perihepatic biloma has decreased after drain placement with maximal thickness approximately 4.3 cm compared to approximately 7 cm prior to drain placement. Moderate fluid collection remains with some small gas bubbles present internally likely related to flushing. The indwelling percutaneous drain is well positioned in the posterior dependent aspect of the collection. Pancreas: Stable appearance. Spleen: Normal in size without focal abnormality. Adrenals/Urinary Tract: No hydronephrosis. No adrenal masses. The bladder is unremarkable. Stomach/Bowel: Bowel shows no evidence of obstruction, ileus or perforation. No free intraperitoneal air identified Vascular/Lymphatic: No significant vascular findings are present. No enlarged abdominal or pelvic lymph nodes. Reproductive: Prostate is unremarkable. Other: Small volume of scattered ascites in the peritoneal cavity. This appears slightly less prominent overall compared to the prior CT. No hernias identified. Musculoskeletal: Stable mild loss of height of the T12 vertebral body. IMPRESSION: 1. Decrease in size of the perihepatic biloma after drain placement. Moderate fluid collection remains with some small gas bubbles present internally likely related to flushing. The indwelling percutaneous drain is well positioned in the posterior dependent aspect of the collection. 2. Stable pneumobilia and positioning of common bile duct metallic stent. 3. Stable cholelithiasis with slight decrease in gallbladder distension. 4. Small volume of scattered ascites in the peritoneal cavity. This appears  slightly less prominent overall compared to the prior CT. 5. Trace left pleural effusion. 6. Stable mild loss of height of the T12 vertebral body. Electronically Signed   By: Aletta Edouard M.D.   On: 10/19/2019 14:25   IR Radiologist Eval & Mgmt  Result Date: 11/02/2019 Please refer to notes tab for details about interventional procedure. (Op Note)  IR Radiologist Eval & Mgmt  Result Date: 10/19/2019 Please refer to notes tab for details about interventional procedure. (Op Note)   Labs:  CBC: Recent Labs    10/04/19 0438 10/06/19 0305 10/20/19 1101 11/10/19 1302  WBC 13.5* 11.1* 7.2  6.8  HGB 8.3* 7.7* 8.9* 9.5*  HCT 26.3* 24.5* 28.0* 30.9*  PLT 205 260 217 140*    COAGS: Recent Labs    06/03/19 0146 09/23/19 0930 09/24/19 0418 09/30/19 0445  INR 1.0 1.4* 1.7* 1.6*    BMP: Recent Labs    10/02/19 0318 10/02/19 0318 10/03/19 0615 10/03/19 0615 10/04/19 0438 10/06/19 0305 10/20/19 1101 11/10/19 1302  NA 135   < > 136   < > 134* 134* 135 135  K 3.3*   < > 3.1*   < > 3.2* 3.1* 5.0 3.5  CL 106   < > 107   < > 106 106 102 102  CO2 21*   < > 22   < > 19* 19* 26 27  GLUCOSE 132*   < > 140*   < > 116* 100* 127* 121*  BUN 18   < > 15   < > 15 12 10 8   CALCIUM 7.6*   < > 7.7*   < > 7.7* 7.6* 9.0 8.9  CREATININE 0.94   < > 0.86   < > 0.80 0.60* 0.74 0.75  GFRNONAA >60   < > >60  --  >60 >60  --  >60  GFRAA >60  --  >60  --  >60 >60  --   --    < > = values in this interval not displayed.    LIVER FUNCTION TESTS: Recent Labs    10/03/19 0615 10/03/19 0615 10/04/19 0438 10/06/19 0305 10/20/19 1101 11/10/19 1302  BILITOT 1.4*   < > 1.4* 0.9 0.9 2.5*  AST 22   < > 19 22 36* 166*  ALT 14   < > 13 15 18  57*  ALKPHOS 105  --  105 96  --  519*  PROT 5.2*   < > 5.4* 5.4* 6.8 7.0  ALBUMIN 1.7*  --  1.8* 1.7*  --  2.5*   < > = values in this interval not displayed.    TUMOR MARKERS: No results for input(s): AFPTM, CEA, CA199, CHROMGRNA in the last 8760  hours.  Assessment/Plan:  20 x 6 x 15 cm peritoneal or subcapsular organized collection along the right liver with a few bubbles of gas.   S/P placement of a RUQ drain by Dr. Earleen Newport on 8/27.  Minimal to no output in the past several days per patient.  CT images reviewed by Dr. Serafina Royals. Resolution of fluid collection.  Drain removed today without issue and patient tolerated well.  Sterile dressing placed. Patient understands he can remove the dressing Friday and shower.  Return PRN.  Electronically Signed: Murrell Redden PA-C 11/16/2019, 1:02 PM    Please refer to Dr. Malachi Carl attestation of this note for management and plan.

## 2019-11-18 ENCOUNTER — Telehealth: Payer: Self-pay

## 2019-11-18 NOTE — Telephone Encounter (Signed)
Mr Saul Fordyce daughter called requesting results from recent CT scan done.  Forwarded to Dr. Burr Medico

## 2019-11-18 NOTE — Telephone Encounter (Signed)
I called his daughter and reviewed his CT AP scan findings. Will review his case in our GI conference next week.   Truitt Merle MD

## 2019-11-23 ENCOUNTER — Telehealth: Payer: Self-pay

## 2019-11-23 NOTE — Telephone Encounter (Signed)
Left voice message for patient' daughter to let her know we did not get to discuss her father in Tumor Board this morning.  Will add to next weeks and let her know what they recommend.

## 2019-11-24 LAB — CYTOLOGY - NON PAP

## 2019-11-30 ENCOUNTER — Other Ambulatory Visit: Payer: Self-pay

## 2019-11-30 NOTE — Progress Notes (Signed)
Spoke with patient's daughter Isaac Doyle and the patient regarding recommendation of the Tumor Board this morning. Per Dr. Burr Medico informed her that recommendation is a diagnostic paracentesis.  I explained if it comes back negative for cancer cells then we can refer back for surgical consult and possible radiation treatment.  If it is positive then this would confirm a metastatic process.  She asked the patient while I was on the phone and he would like to proceed with the diagnostic paracentesis. They both verbalized an understanding.  Dr. Burr Medico was made aware.

## 2019-12-01 ENCOUNTER — Other Ambulatory Visit: Payer: Self-pay

## 2019-12-01 DIAGNOSIS — C250 Malignant neoplasm of head of pancreas: Secondary | ICD-10-CM

## 2019-12-01 NOTE — Progress Notes (Signed)
Spoke with patient's daughter [name] to inform her US paracentesis will be on Monday 11/1 needs to arrive by 10:45 at [facility].  She verbalized an understanding.

## 2019-12-04 ENCOUNTER — Other Ambulatory Visit: Payer: Self-pay

## 2019-12-04 ENCOUNTER — Emergency Department (HOSPITAL_COMMUNITY): Payer: No Typology Code available for payment source

## 2019-12-04 ENCOUNTER — Inpatient Hospital Stay (HOSPITAL_COMMUNITY)
Admission: EM | Admit: 2019-12-04 | Discharge: 2019-12-08 | DRG: 435 | Disposition: A | Discharge status: Hospice | Payer: No Typology Code available for payment source | Attending: Internal Medicine | Admitting: Internal Medicine

## 2019-12-04 ENCOUNTER — Encounter (HOSPITAL_COMMUNITY): Payer: Self-pay

## 2019-12-04 DIAGNOSIS — Z79899 Other long term (current) drug therapy: Secondary | ICD-10-CM | POA: Diagnosis not present

## 2019-12-04 DIAGNOSIS — Z9221 Personal history of antineoplastic chemotherapy: Secondary | ICD-10-CM | POA: Diagnosis not present

## 2019-12-04 DIAGNOSIS — I10 Essential (primary) hypertension: Secondary | ICD-10-CM | POA: Diagnosis present

## 2019-12-04 DIAGNOSIS — K8021 Calculus of gallbladder without cholecystitis with obstruction: Secondary | ICD-10-CM | POA: Diagnosis present

## 2019-12-04 DIAGNOSIS — C259 Malignant neoplasm of pancreas, unspecified: Secondary | ICD-10-CM

## 2019-12-04 DIAGNOSIS — I82412 Acute embolism and thrombosis of left femoral vein: Secondary | ICD-10-CM | POA: Diagnosis present

## 2019-12-04 DIAGNOSIS — C787 Secondary malignant neoplasm of liver and intrahepatic bile duct: Secondary | ICD-10-CM | POA: Diagnosis not present

## 2019-12-04 DIAGNOSIS — Z8249 Family history of ischemic heart disease and other diseases of the circulatory system: Secondary | ICD-10-CM | POA: Diagnosis not present

## 2019-12-04 DIAGNOSIS — D696 Thrombocytopenia, unspecified: Secondary | ICD-10-CM | POA: Diagnosis present

## 2019-12-04 DIAGNOSIS — K828 Other specified diseases of gallbladder: Secondary | ICD-10-CM | POA: Diagnosis present

## 2019-12-04 DIAGNOSIS — Z20822 Contact with and (suspected) exposure to covid-19: Secondary | ICD-10-CM | POA: Diagnosis present

## 2019-12-04 DIAGNOSIS — Z66 Do not resuscitate: Secondary | ICD-10-CM | POA: Diagnosis present

## 2019-12-04 DIAGNOSIS — R109 Unspecified abdominal pain: Secondary | ICD-10-CM

## 2019-12-04 DIAGNOSIS — R1011 Right upper quadrant pain: Secondary | ICD-10-CM | POA: Diagnosis not present

## 2019-12-04 DIAGNOSIS — Z87820 Personal history of traumatic brain injury: Secondary | ICD-10-CM | POA: Diagnosis not present

## 2019-12-04 DIAGNOSIS — R188 Other ascites: Secondary | ICD-10-CM | POA: Diagnosis present

## 2019-12-04 DIAGNOSIS — K750 Abscess of liver: Secondary | ICD-10-CM | POA: Diagnosis present

## 2019-12-04 DIAGNOSIS — Z6821 Body mass index (BMI) 21.0-21.9, adult: Secondary | ICD-10-CM

## 2019-12-04 DIAGNOSIS — Z7189 Other specified counseling: Secondary | ICD-10-CM | POA: Diagnosis not present

## 2019-12-04 DIAGNOSIS — C250 Malignant neoplasm of head of pancreas: Secondary | ICD-10-CM | POA: Diagnosis not present

## 2019-12-04 DIAGNOSIS — I82402 Acute embolism and thrombosis of unspecified deep veins of left lower extremity: Secondary | ICD-10-CM

## 2019-12-04 DIAGNOSIS — I700 Atherosclerosis of aorta: Secondary | ICD-10-CM | POA: Diagnosis present

## 2019-12-04 DIAGNOSIS — R17 Unspecified jaundice: Secondary | ICD-10-CM | POA: Diagnosis not present

## 2019-12-04 DIAGNOSIS — D638 Anemia in other chronic diseases classified elsewhere: Secondary | ICD-10-CM | POA: Diagnosis present

## 2019-12-04 DIAGNOSIS — G893 Neoplasm related pain (acute) (chronic): Secondary | ICD-10-CM | POA: Diagnosis present

## 2019-12-04 DIAGNOSIS — E43 Unspecified severe protein-calorie malnutrition: Secondary | ICD-10-CM | POA: Diagnosis present

## 2019-12-04 DIAGNOSIS — K831 Obstruction of bile duct: Secondary | ICD-10-CM

## 2019-12-04 DIAGNOSIS — E871 Hypo-osmolality and hyponatremia: Secondary | ICD-10-CM | POA: Diagnosis not present

## 2019-12-04 DIAGNOSIS — D735 Infarction of spleen: Secondary | ICD-10-CM | POA: Diagnosis present

## 2019-12-04 DIAGNOSIS — I824Z2 Acute embolism and thrombosis of unspecified deep veins of left distal lower extremity: Secondary | ICD-10-CM | POA: Diagnosis not present

## 2019-12-04 DIAGNOSIS — I871 Compression of vein: Secondary | ICD-10-CM | POA: Diagnosis present

## 2019-12-04 DIAGNOSIS — I2699 Other pulmonary embolism without acute cor pulmonale: Secondary | ICD-10-CM | POA: Diagnosis present

## 2019-12-04 DIAGNOSIS — Z515 Encounter for palliative care: Secondary | ICD-10-CM

## 2019-12-04 DIAGNOSIS — I824Y2 Acute embolism and thrombosis of unspecified deep veins of left proximal lower extremity: Secondary | ICD-10-CM | POA: Diagnosis not present

## 2019-12-04 LAB — HEPARIN LEVEL (UNFRACTIONATED)
Heparin Unfractionated: 0.53 IU/mL (ref 0.30–0.70)
Heparin Unfractionated: 0.49 IU/mL (ref 0.30–0.70)

## 2019-12-04 LAB — COMPREHENSIVE METABOLIC PANEL
ALT: 16 U/L (ref 0–44)
AST: 36 U/L (ref 15–41)
Albumin: 2.1 g/dL — ABNORMAL LOW (ref 3.5–5.0)
Alkaline Phosphatase: 242 U/L — ABNORMAL HIGH (ref 38–126)
Anion gap: 8 (ref 5–15)
BUN: 8 mg/dL (ref 8–23)
CO2: 22 mmol/L (ref 22–32)
Calcium: 8 mg/dL — ABNORMAL LOW (ref 8.9–10.3)
Chloride: 101 mmol/L (ref 98–111)
Creatinine, Ser: 0.71 mg/dL (ref 0.61–1.24)
GFR, Estimated: >60 mL/min (ref >60)
Glucose, Bld: 126 mg/dL — ABNORMAL HIGH (ref 70–99)
Potassium: 3.6 mmol/L (ref 3.5–5.1)
Sodium: 131 mmol/L — ABNORMAL LOW (ref 135–145)
Total Bilirubin: 4 mg/dL — ABNORMAL HIGH (ref 0.3–1.2)
Total Protein: 5.8 g/dL — ABNORMAL LOW (ref 6.5–8.1)

## 2019-12-04 LAB — CBC WITH DIFFERENTIAL/PLATELET
Abs Immature Granulocytes: 0.04 10*3/uL (ref 0.00–0.07)
Basophils Absolute: 0 10*3/uL (ref 0.0–0.1)
Basophils Relative: 0 %
Eosinophils Absolute: 0 10*3/uL (ref 0.0–0.5)
Eosinophils Relative: 0 %
HCT: 27.5 % — ABNORMAL LOW (ref 39.0–52.0)
Hemoglobin: 8.8 g/dL — ABNORMAL LOW (ref 13.0–17.0)
Immature Granulocytes: 1 %
Lymphocytes Relative: 13 %
Lymphs Abs: 0.9 10*3/uL (ref 0.7–4.0)
MCH: 26.7 pg (ref 26.0–34.0)
MCHC: 32 g/dL (ref 30.0–36.0)
MCV: 83.6 fL (ref 80.0–100.0)
Monocytes Absolute: 0.5 10*3/uL (ref 0.1–1.0)
Monocytes Relative: 7 %
Neutro Abs: 5.6 10*3/uL (ref 1.7–7.7)
Neutrophils Relative %: 79 %
Platelets: 120 10*3/uL — ABNORMAL LOW (ref 150–400)
RBC: 3.29 MIL/uL — ABNORMAL LOW (ref 4.22–5.81)
RDW: 15.9 % — ABNORMAL HIGH (ref 11.5–15.5)
WBC: 7.1 10*3/uL (ref 4.0–10.5)
nRBC: 0 % (ref 0.0–0.2)

## 2019-12-04 LAB — LIPASE, BLOOD: Lipase: 17 U/L (ref 11–51)

## 2019-12-04 MED ORDER — ATENOLOL 50 MG PO TABS
100.0000 mg | ORAL_TABLET | Freq: Every day | ORAL | Status: DC
  Administered 2019-12-04 – 2019-12-08 (×5): 100 mg via ORAL
  Filled 2019-12-04 (×5): qty 2

## 2019-12-04 MED ORDER — ACETAMINOPHEN 650 MG RE SUPP: 650.0000 mg | Freq: Four times a day (QID) | RECTAL | Status: DC | PRN

## 2019-12-04 MED ORDER — MORPHINE SULFATE (PF) 4 MG/ML IV SOLN
4.0000 mg | Freq: Once | INTRAVENOUS | Status: AC
  Administered 2019-12-04: 4 mg via INTRAVENOUS
  Filled 2019-12-04: qty 1

## 2019-12-04 MED ORDER — PIPERACILLIN-TAZOBACTAM 3.375 G IVPB 30 MIN
3.3750 g | Freq: Once | INTRAVENOUS | Status: AC
  Administered 2019-12-04: 3.375 g via INTRAVENOUS
  Filled 2019-12-04: qty 50

## 2019-12-04 MED ORDER — CHLORHEXIDINE GLUCONATE CLOTH 2 % EX PADS
6.0000 | MEDICATED_PAD | Freq: Every day | CUTANEOUS | Status: DC
  Administered 2019-12-04 – 2019-12-07 (×4): 6 via TOPICAL

## 2019-12-04 MED ORDER — SODIUM CHLORIDE (PF) 0.9 % IJ SOLN
INTRAMUSCULAR | Status: AC
  Filled 2019-12-04: qty 50

## 2019-12-04 MED ORDER — ONDANSETRON HCL 4 MG/2ML IJ SOLN
4.0000 mg | Freq: Once | INTRAMUSCULAR | Status: AC
  Administered 2019-12-04: 4 mg via INTRAVENOUS
  Filled 2019-12-04: qty 2

## 2019-12-04 MED ORDER — POLYETHYLENE GLYCOL 3350 17 G PO PACK: 17.0000 g | PACK | Freq: Every day | ORAL | Status: DC | PRN

## 2019-12-04 MED ORDER — ENSURE ENLIVE PO LIQD
237.0000 mL | Freq: Two times a day (BID) | ORAL | Status: DC
  Administered 2019-12-04: 237 mL via ORAL

## 2019-12-04 MED ORDER — ONDANSETRON 4 MG PO TBDP: 4.0000 mg | ORAL_TABLET | Freq: Three times a day (TID) | ORAL | Status: DC | PRN

## 2019-12-04 MED ORDER — IOHEXOL 300 MG/ML  SOLN
100.0000 mL | Freq: Once | INTRAMUSCULAR | Status: AC | PRN
  Administered 2019-12-04: 100 mL via INTRAVENOUS

## 2019-12-04 MED ORDER — HYDROMORPHONE HCL 1 MG/ML IJ SOLN
0.5000 mg | INTRAMUSCULAR | Status: DC | PRN
  Administered 2019-12-06: 0.5 mg via INTRAVENOUS
  Filled 2019-12-04: qty 0.5

## 2019-12-04 MED ORDER — POTASSIUM CHLORIDE CRYS ER 20 MEQ PO TBCR
40.0000 meq | EXTENDED_RELEASE_TABLET | Freq: Every day | ORAL | Status: DC
  Administered 2019-12-04 – 2019-12-08 (×5): 40 meq via ORAL
  Filled 2019-12-04 (×5): qty 2

## 2019-12-04 MED ORDER — ONDANSETRON HCL 4 MG/2ML IJ SOLN: 4.0000 mg | Freq: Four times a day (QID) | INTRAMUSCULAR | Status: DC | PRN

## 2019-12-04 MED ORDER — LACTATED RINGERS IV BOLUS
1000.0000 mL | Freq: Once | INTRAVENOUS | Status: AC
  Administered 2019-12-04: 1000 mL via INTRAVENOUS

## 2019-12-04 MED ORDER — PIPERACILLIN-TAZOBACTAM 3.375 G IVPB
3.3750 g | Freq: Three times a day (TID) | INTRAVENOUS | Status: DC
  Administered 2019-12-04 – 2019-12-08 (×11): 3.375 g via INTRAVENOUS
  Filled 2019-12-04 (×12): qty 50

## 2019-12-04 MED ORDER — ACETAMINOPHEN 325 MG PO TABS: 650.0000 mg | ORAL_TABLET | Freq: Four times a day (QID) | ORAL | Status: DC | PRN

## 2019-12-04 MED ORDER — SODIUM CHLORIDE 0.9% FLUSH
10.0000 mL | INTRAVENOUS | Status: DC | PRN
  Administered 2019-12-08: 10 mL

## 2019-12-04 MED ORDER — HYDROCODONE-ACETAMINOPHEN 5-325 MG PO TABS
1.0000 | ORAL_TABLET | ORAL | Status: DC | PRN
  Administered 2019-12-04: 1 via ORAL
  Administered 2019-12-05 – 2019-12-07 (×2): 2 via ORAL
  Filled 2019-12-04: qty 2
  Filled 2019-12-04: qty 1
  Filled 2019-12-04: qty 2

## 2019-12-04 MED ORDER — HEPARIN BOLUS VIA INFUSION
2000.0000 [IU] | Freq: Once | INTRAVENOUS | Status: AC
  Administered 2019-12-04: 2000 [IU] via INTRAVENOUS
  Filled 2019-12-04: qty 2000

## 2019-12-04 MED ORDER — HEPARIN (PORCINE) 25000 UT/250ML-% IV SOLN
1550.0000 [IU]/h | INTRAVENOUS | Status: DC
  Administered 2019-12-04 – 2019-12-05 (×2): 1300 [IU]/h via INTRAVENOUS
  Administered 2019-12-06: 1550 [IU]/h via INTRAVENOUS
  Administered 2019-12-06: 1450 [IU]/h via INTRAVENOUS
  Administered 2019-12-07 – 2019-12-08 (×2): 1550 [IU]/h via INTRAVENOUS
  Filled 2019-12-04 (×7): qty 250

## 2019-12-04 MED ORDER — FERROUS SULFATE 325 (65 FE) MG PO TABS
325.0000 mg | ORAL_TABLET | Freq: Two times a day (BID) | ORAL | Status: DC
  Administered 2019-12-04 – 2019-12-08 (×8): 325 mg via ORAL
  Filled 2019-12-04 (×8): qty 1

## 2019-12-04 NOTE — ED Triage Notes (Signed)
Pt to ER via EMS complaining of RUQ abdominal pain.  PT has pancreatic cancer.  Pt states that he had similar pain a couple of weeks ago and had an infection.

## 2019-12-04 NOTE — ED Provider Notes (Signed)
[facility] DEPT Provider Note   CSN: [id number] Arrival date & time: 12/04/19  0447   History Chief Complaint  Patient presents with  . Abdominal Pain    hx pancreatic cancer    Isaac Doyle is a 64 y.o. male.  The history is provided by the patient.  Abdominal Pain He has history of hypertension, pancreatic cancer and comes in complaining of right upper abdominal pain for the last 2 days.  Pain is getting worse and he currently rates it at 8/10.  Pain sometimes radiates to the back into the chest.  There is no associated nausea or vomiting or diarrhea.  He denies fever or chills.  He has not taken anything for pain.  He does have history of an intra-abdominal abscess that was in the same region.  He states that he is supposed to see his cancer doctor in the next few days to hear if he is supposed to get radiation treatment.  Past Medical History:  Diagnosis Date  . Hypertension     Patient Active Problem List   Diagnosis Date Noted  . Intraabdominal fluid collection   . Abdominal pain 09/30/2019  . Elevated LFTs   . RUQ abdominal pain   . Severe sepsis with acute organ dysfunction ([place]) 09/23/2019  . 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 Anemia of chronic disease 09/23/2019  . Cholangitis 09/23/2019  . Acute cholecystitis   . Port-A-Cath in place 08/17/2019  . Symptomatic anemia 08/08/2019  . Malignant obstructive jaundice ([place]) 06/03/2019  . Malignant neoplasm of pancreas ([place])   . ARF (acute renal failure) ([place]) 05/10/2019  . Essential hypertension 05/10/2019  . Hypokalemia 05/10/2019  . Normochromic normocytic anemia 05/10/2019  . Malignant biliary obstruction ([place])   . Jaundice 05/09/2019    Past Surgical History:  Procedure Laterality Date  . BILIARY STENT PLACEMENT  05/11/2019   Procedure: BILIARY STENT PLACEMENT;  Surgeon: Irene Shipper, MD;  Location: [facility] ENDOSCOPY;  Service: Endoscopy;;    . BILIARY STENT PLACEMENT  06/03/2019   Procedure: BILIARY STENT PLACEMENT;  Surgeon: Jackquline Denmark, MD;  Location: [facility] ENDOSCOPY;  Service: Endoscopy;;  . BILIARY STENT PLACEMENT N/A 09/24/2019   Procedure: BILIARY STENT PLACEMENT;  Surgeon: Ladene Artist, MD;  Location: [facility] ENDOSCOPY;  Service: Endoscopy;  Laterality: N/A;  . ERCP N/A 05/11/2019   Procedure: ENDOSCOPIC RETROGRADE CHOLANGIOPANCREATOGRAPHY (ERCP);  Surgeon: Irene Shipper, MD;  Location: [facility] ENDOSCOPY;  Service: Endoscopy;  Laterality: N/A;  with   . ERCP N/A 06/03/2019   Procedure: ENDOSCOPIC RETROGRADE CHOLANGIOPANCREATOGRAPHY (ERCP);  Surgeon: Jackquline Denmark, MD;  Location: [facility] ENDOSCOPY;  Service: Endoscopy;  Laterality: N/A;  . ERCP N/A 09/24/2019   Procedure: ENDOSCOPIC RETROGRADE CHOLANGIOPANCREATOGRAPHY (ERCP);  Surgeon: Ladene Artist, MD;  Location: [facility] ENDOSCOPY;  Service: Endoscopy;  Laterality: N/A;  . ESOPHAGOGASTRODUODENOSCOPY (EGD) WITH PROPOFOL N/A 05/19/2019   Procedure: ESOPHAGOGASTRODUODENOSCOPY (EGD) WITH PROPOFOL;  Surgeon: [name], MD;  Location: [facility] ENDOSCOPY;  Service: Endoscopy;  Laterality: N/A;  . EUS N/A 05/19/2019   Procedure: UPPER ENDOSCOPIC ULTRASOUND (EUS) LINEAR;  Surgeon: [name], MD;  Location: [facility] ENDOSCOPY;  Service: Endoscopy;  Laterality: N/A;  . FINE NEEDLE ASPIRATION N/A 05/19/2019   Procedure: FINE NEEDLE ASPIRATION (FNA) LINEAR;  Surgeon: [name], MD;  Location: [facility] ENDOSCOPY;  Service: Endoscopy;  Laterality: N/A;  . IR IMAGING GUIDED PORT INSERTION  06/08/2019  . IR RADIOLOGIST EVAL & MGMT  10/19/2019  .  IR RADIOLOGIST EVAL & MGMT  11/02/2019  . IR RADIOLOGIST EVAL & MGMT  11/16/2019  . REMOVAL OF STONES  09/24/2019   Procedure: REMOVAL OF STONES;  Surgeon: Ladene Artist, MD;  Location: [facility] ENDOSCOPY;  Service: Endoscopy;;  . [name]  05/11/2019   Procedure: SPHINCTEROTOMY;  Surgeon: Irene Shipper, MD;  Location: [facility] ENDOSCOPY;  Service: Endoscopy;;  . [name]  REMOVAL  06/03/2019   Procedure: STENT REMOVAL;  Surgeon: Jackquline Denmark, MD;  Location: [facility] ENDOSCOPY;  Service: Endoscopy;;  . STENT REMOVAL  09/24/2019   Procedure: STENT REMOVAL;  Surgeon: Ladene Artist, MD;  Location: [facility] ENDOSCOPY;  Service: Endoscopy;;       Family History  Problem Relation Age of Onset  . Lymphoma Mother 64       cancer in lymph nodes, unsure of primary  . Heart attack Father   . Lupus Sister     Social History   Tobacco Use  . Smoking status: Never Smoker  . Smokeless tobacco: Never Used  . Tobacco comment: plan to quit completely   Substance Use Topics  . Alcohol use: Yes    Alcohol/week: 22.0 standard drinks    Types: 12 Cans of beer, 10 Shots of liquor per week  . Drug use: Not Currently    Home Medications Prior to Admission medications   Medication Sig Start Date End Date Taking? Authorizing Provider  amoxicillin-clavulanate (AUGMENTIN) 875-125 MG tablet Take 1 tablet by mouth 2 (two) times daily. 10/06/19   Antonieta Pert, MD  amoxicillin-clavulanate (AUGMENTIN) 875-125 MG tablet Take 1 tablet by mouth 2 (two) times daily. 10/20/19   Rosiland Oz, MD  atenolol (TENORMIN) 100 MG tablet Take 1 tablet (100 mg total) by mouth daily. Hold until follow-up with your PCP Patient not taking: Reported on 11/03/2019 09/28/19   Rai, Vernelle Emerald, MD  cetirizine (ZYRTEC) 10 MG tablet Take 10 mg by mouth daily.    [provider]  diphenoxylate-atropine (LOMOTIL) 2.5-0.025 MG tablet Take 1-2 tablets by mouth 4 (four) times daily as needed for diarrhea or loose stools. Patient not taking: Reported on 11/03/2019 07/14/19   Truitt Merle, MD  ferrous sulfate 325 (65 FE) MG tablet Take 1 tablet (325 mg total) by mouth 2 (two) times daily with a meal. 10/06/19   Antonieta Pert, MD  guaifenesin (HUMIBID E) 400 MG TABS tablet Take 1 tablet (400 mg total) by mouth every 6 (six) hours as needed. Patient not taking: Reported on 11/03/2019 05/13/19   Geradine Girt, DO    hydrocortisone (ANUSOL-HC) 2.5 % rectal cream Apply 1 application topically 4 (four) times daily as needed for hemorrhoids. 09/28/19   Rai, Vernelle Emerald, MD  lidocaine-prilocaine (EMLA) cream Apply 1 application topically as needed. Patient not taking: Reported on 11/03/2019 06/23/19   Truitt Merle, MD  loperamide (IMODIUM) 2 MG capsule Take 1-2 capsules (2-4 mg total) by mouth every 6 (six) hours as needed for diarrhea or loose stools. 07/14/19   Truitt Merle, MD  magnesium oxide (MAG-OX) 400 (241.3 Mg) MG tablet Take 1 tablet (400 mg total) by mouth daily. Patient not taking: Reported on 11/03/2019 05/14/19   Geradine Girt, DO  methocarbamol (ROBAXIN) 500 MG tablet Take 1 tablet (500 mg total) by mouth every 6 (six) hours as needed for muscle spasms. 09/28/19   Rai, Ripudeep K, MD  ondansetron (ZOFRAN ODT) 4 MG disintegrating tablet Take 1 tablet (4 mg total) by mouth every 8 (eight) hours as needed for nausea or  vomiting. 09/28/19   Rai, Ripudeep K, MD  pantoprazole (PROTONIX) 40 MG tablet Take 1 tablet (40 mg total) by mouth daily. 09/28/19 01/26/20  Rai, Ripudeep K, MD  potassium chloride SA (KLOR-CON) 20 MEQ tablet Take 2 tablets (40 mEq total) by mouth daily. 08/30/19   Truitt Merle, MD  traMADol (ULTRAM) 50 MG tablet Take 1 tablet (50 mg total) by mouth every 8 (eight) hours as needed for up to 4 doses for moderate pain or severe pain. 10/06/19   Antonieta Pert, MD    Allergies    Patient has no known allergies.  Review of Systems   Review of Systems  Gastrointestinal: Positive for abdominal pain.  All other systems reviewed and are negative.   Physical Exam Updated Vital Signs BP 131/77   Pulse 78   Temp 97.8 F (36.6 C) (Oral)   Resp 16   Wt 70.3 kg   SpO2 100%   BMI 21.62 kg/m   Physical Exam Vitals and nursing note reviewed.   64 year old male, resting comfortably and in no acute distress. Vital signs are normal. Oxygen saturation is 100%, which is normal. Head is normocephalic and  atraumatic. PERRLA, EOMI. Oropharynx is clear. Neck is nontender and supple without adenopathy or JVD. Back is nontender and there is no CVA tenderness. Lungs are clear without rales, wheezes, or rhonchi. Chest is nontender. Heart has regular rate and rhythm without murmur. Abdomen is soft, flat, with mild to moderate tenderness in the right mid and upper abdomen.  There is no rebound or guarding.  There are no masses or hepatosplenomegaly and peristalsis is hypoactive. Extremities have no cyanosis or edema, full range of motion is present. Skin is warm and dry without rash. Neurologic: Mental status is normal, cranial nerves are intact, there are no motor or sensory deficits.  ED Results / Procedures / Treatments   Labs (all labs ordered are listed, but only abnormal results are displayed) Labs Reviewed  COMPREHENSIVE METABOLIC PANEL  LIPASE, BLOOD  CBC WITH DIFFERENTIAL/PLATELET   Radiology CT ABDOMEN PELVIS W CONTRAST  Result Date: 12/04/2019 CLINICAL DATA:  Right upper quadrant abdominal pain. History of pancreatic carcinoma. EXAM: CT ABDOMEN AND PELVIS WITH CONTRAST TECHNIQUE: Multidetector CT imaging of the abdomen and pelvis was performed using the standard protocol following bolus administration of intravenous contrast. CONTRAST:  117mL OMNIPAQUE IOHEXOL 300 MG/ML  SOLN COMPARISON:  11/16/2019 and older CTs. FINDINGS: Lower chest: Small left pleural effusion. Mild dependent atelectasis in the posterior left lower lobe. Small area of opacity in the peripheral right lower lobe consistent with atelectasis or scarring, similar to the prior study. Hepatobiliary: Liver normal in size. Multiple small hypoattenuating liver masses consistent with metastatic disease. These all appear subcentimeter in size. These appear increased compared to the prior CT. Reference measurement of a lesion at the posterior aspect of segment 7 currently measures 8 mm, 3-4 mm on the prior CT. There is intrahepatic  biliary air that is unchanged. Gallbladder is distended with dependent stones and non dependent air, similar to the prior CT. A biliary stent extends from the peripheral porta hepatis to the ampulla of Vater, unchanged. Pancreas: Pancreatic mass is not well-defined. Most of the pancreas shows depressed enhancement. There are peripancreatic lymph nodes, largest 1.4 cm in short axis, consistent with metastatic disease. These findings are stable from the prior CT. Spleen: Peripheral, wedge-shaped hypoattenuating area is mid to upper spleen measuring 16 x 12 mm, in not evident on the prior CT, likely a  small infarct. Spleen normal in size with no other lesions. Adrenals/Urinary Tract: No adrenal masses. Kidneys normal in overall size, orientation and position with symmetric enhancement and excretion. Low-density renal masses consistent with cysts, larger the left, are stable. No hydronephrosis. Normal ureters. Normal bladder. Stomach/Bowel: Stomach is unremarkable. Small bowel and colon are normal in caliber. No wall thickening. No evidence of inflammation. Multiple sigmoid diverticula without diverticulitis. Vascular/Lymphatic: Possible deep venous thrombosis at the level of the left common femoral vein also suggested in the left deep femoral vein. This could be from incomplete opacification and not thrombus. Mild aortic atherosclerosis.  No aneurysm. Portal vein is compressed by the pancreatic mass, with the confluence of the portal vein, splenic vein and superior mesenteric vein mostly effaced. This is similar to the prior CT. Porta hepatis adenopathy, stable from the prior CT. No other enlarged lymph nodes. Reproductive: Unremarkable. Other: Small to moderate amount of ascites, unchanged from the prior CT. Musculoskeletal: Chronic mild compression fracture of T12. No acute fractures. No osteoblastic or osteolytic lesions. IMPRESSION: 1. No definite acute abnormality within the abdomen or pelvis. 2. Gallbladder is  distended with dependent stones and non dependent air, but no definite wall thickening. Early acute cholecystitis felt unlikely given the stable appearance from the prior CT, although not excluded. 3. Findings of pancreatic carcinoma and metastatic disease including peripancreatic metastatic adenopathy, multiple small liver masses and ascites. Small liver masses appear increased when compared to the prior CT consistent with metastatic disease progression. 4. New small wedge-shaped area of hypoattenuation in the spleen. Suspect a small infarct. 5. Possible deep venous thrombosis of the left lower extremity in the common femoral and deep femoral veins. Consider follow-up left lower extremity venous duplex exam if there are clinical findings supporting deep venous thrombosis. Electronically Signed   By: Lajean Manes M.D.   On: 12/04/2019 07:29    Procedures Procedures  Medications Ordered in ED Medications  lactated ringers bolus 1,000 mL (has no administration in time range)  ondansetron (ZOFRAN) injection 4 mg (has no administration in time range)  morphine 4 MG/ML injection 4 mg (has no administration in time range)    ED Course  I have reviewed the triage vital signs and the nursing notes.  Pertinent labs & imaging results that were available during my care of the patient were reviewed by me and considered in my medical decision making (see chart for details).  MDM Rules/Calculators/A&P Right upper quadrant abdominal pain in patient with known history of pancreatic cancer.  Exact reason for the pain is not clear.  He does have a biliary stent.  Old records are reviewed, and he is supposed to have a paracentesis done tomorrow to evaluate for malignant cells.  Will check screening labs and sent for CT of abdomen and pelvis today.  In the meantime, he is given IV fluids, morphine, ondansetron.  Labs are still pending.  CT shows no definite acute process.  Cholelithiasis is present without  findings of cholecystitis.  Pancreatic cancer identified with apparent progression of metastatic disease.  Possible small splenic infarct noted.  Also, possible DVT of left lower extremity, venous Doppler has been ordered.  Case is signed out to Dr. Sedonia Small.  Final Clinical Impression(s) / ED Diagnoses Final diagnoses:  Abdominal pain, unspecified abdominal location  Malignant neoplasm of head of pancreas [facility])    Rx / DC Orders ED Discharge Orders    None       Delora Fuel, MD 97/41/63 [phone]

## 2019-12-04 NOTE — Progress Notes (Signed)
Lower extremity venous LT study completed.  Preliminary results relayed to provider in ED for [place]ero, MD.   See CV Proc for preliminary results report.   Darlin Coco, RDMS

## 2019-12-04 NOTE — Progress Notes (Signed)
ANTICOAGULATION CONSULT NOTE - Brief note  Pharmacy Consult for IV heparin Indication: DVT  Patient Measurements: Height: 5\' 11"  (180.3 cm) Weight: 70.3 kg (155 lb) IBW/kg (Calculated) : 75.3 Heparin Dosing Weight: 70.3 kg  Labs: Recent Labs    12/04/19 0619 12/04/19 1652  HGB 8.8*  --   HCT 27.5*  --   PLT 120*  --   HEPARINUNFRC  --  0.53  CREATININE 0.71  --      Medications:  Infusions:  . heparin 1,300 Units/hr (12/04/19 1149)  . piperacillin-tazobactam (ZOSYN)  IV      Assessment: 64 yo male with hx pancreatic cancer and was supposed to have paracentesis tomorrow for further evaluation of cancer presents to ED with abdominal pain. Per Dopplers, has new DVT to start IV heparin per Rx dosing.  First heparin level 0.53, therapeutic on heparin at 1300 units/hr No bleeding or complications reported.  Goal of Therapy:  Heparin level 0.3-0.7 units/ml Monitor platelets by anticoagulation protocol: Yes   Plan:   Continue IV heparin infusion rate of 1300 units/hr  Check heparin level 6 hours to confirm  Daily CBC  Gretta Arab PharmD, BCPS Clinical Pharmacist [facility] main pharmacy [phone] 12/04/2019 6:06 PM

## 2019-12-04 NOTE — H&P (Addendum)
History and Physical        Hospital Admission Note Date: 12/04/2019  Patient name: Isaac Doyle Medical record number: [id number] Date of birth: 1955/07/30 Age: 64 y.o. Gender: male  PCP: Pcp, No  Patient coming from: Home Lives with: Daughter At baseline, ambulates: Geneticist, molecular Complaint  Patient presents with  . Abdominal Pain    hx pancreatic cancer      HPI:   This is a 64 year old male with a past medical history of metastatic pancreatic cancer (patient of Dr. Burr Medico), hypertension, CBD obstruction with stent, hepatic abscess s/p drain removal on 11/16/2019 who presented to the ED complaining of RUQ abdominal pain x2 days with radiation to the back and chest.  No nausea, vomiting, diarrhea, fever, chills.  ED Course: Afebrile and hemodynamically stable on room air. Notable labs: Sodium 131, alk phos 242, T bili 4.0, albumin 2.1, lipase 17, Hb 8.8 (at baseline).  CT abdomen pelvis with contrast with small splenic infarct and possible DVT of the LLE in the common femoral and deep femoral veins and known pancreatic cancer with metastatic adenopathy and hepatic masses which are slightly larger than prior.  Bilateral lower extremity US: LLE DVT in proximal profunda vein.  He was given morphine, 1 L LR bolus and started on a heparin drip.  Vitals:   12/04/19 1004 12/04/19 1105  BP: (!) 156/76 (!) 151/73  Pulse: 70 71  Resp: 16 17  Temp:    SpO2: 99% 98%     Review of Systems:  Review of Systems  Respiratory: Negative for shortness of breath.   Cardiovascular: Negative for leg swelling.  Gastrointestinal: Positive for abdominal pain.  Skin: Negative for rash.  All other systems reviewed and are negative.   Medical/Social/Family History   Past Medical History: Past Medical History:  Diagnosis Date  . Hypertension     Past Surgical  History:  Procedure Laterality Date  . BILIARY STENT PLACEMENT  05/11/2019   Procedure: BILIARY STENT PLACEMENT;  Surgeon: Irene Shipper, MD;  Location: [facility] ENDOSCOPY;  Service: Endoscopy;;  . BILIARY STENT PLACEMENT  06/03/2019   Procedure: BILIARY STENT PLACEMENT;  Surgeon: Jackquline Denmark, MD;  Location: [facility] ENDOSCOPY;  Service: Endoscopy;;  . BILIARY STENT PLACEMENT N/A 09/24/2019   Procedure: BILIARY STENT PLACEMENT;  Surgeon: Ladene Artist, MD;  Location: [facility] ENDOSCOPY;  Service: Endoscopy;  Laterality: N/A;  . ERCP N/A 05/11/2019   Procedure: ENDOSCOPIC RETROGRADE CHOLANGIOPANCREATOGRAPHY (ERCP);  Surgeon: Irene Shipper, MD;  Location: [facility] ENDOSCOPY;  Service: Endoscopy;  Laterality: N/A;  with   . ERCP N/A 06/03/2019   Procedure: ENDOSCOPIC RETROGRADE CHOLANGIOPANCREATOGRAPHY (ERCP);  Surgeon: Jackquline Denmark, MD;  Location: [facility] ENDOSCOPY;  Service: Endoscopy;  Laterality: N/A;  . ERCP N/A 09/24/2019   Procedure: ENDOSCOPIC RETROGRADE CHOLANGIOPANCREATOGRAPHY (ERCP);  Surgeon: Ladene Artist, MD;  Location: [facility] ENDOSCOPY;  Service: Endoscopy;  Laterality: N/A;  . ESOPHAGOGASTRODUODENOSCOPY (EGD) WITH PROPOFOL N/A 05/19/2019   Procedure: ESOPHAGOGASTRODUODENOSCOPY (EGD) WITH PROPOFOL;  Surgeon: [name], MD;  Location: [facility] ENDOSCOPY;  Service: Endoscopy;  Laterality: N/A;  . EUS N/A 05/19/2019   Procedure: UPPER ENDOSCOPIC ULTRASOUND (EUS) LINEAR;  Surgeon: [name], MD;  Location: [facility] ENDOSCOPY;  Service: Endoscopy;  Laterality: N/A;  . FINE NEEDLE ASPIRATION N/A 05/19/2019   Procedure: FINE NEEDLE ASPIRATION (FNA) LINEAR;  Surgeon: [name], MD;  Location: [facility] ENDOSCOPY;  Service: Endoscopy;  Laterality: N/A;  . IR IMAGING GUIDED PORT INSERTION  06/08/2019  . IR RADIOLOGIST EVAL & MGMT  10/19/2019  . IR RADIOLOGIST EVAL & MGMT  11/02/2019  . IR RADIOLOGIST EVAL & MGMT  11/16/2019  . REMOVAL OF STONES  09/24/2019   Procedure: REMOVAL OF STONES;  Surgeon: Ladene Artist, MD;   Location: [facility] ENDOSCOPY;  Service: Endoscopy;;  . [name]  05/11/2019   Procedure: SPHINCTEROTOMY;  Surgeon: Irene Shipper, MD;  Location: [facility] ENDOSCOPY;  Service: Endoscopy;;  . [name] REMOVAL  06/03/2019   Procedure: STENT REMOVAL;  Surgeon: Jackquline Denmark, MD;  Location: [facility] ENDOSCOPY;  Service: Endoscopy;;  . STENT REMOVAL  09/24/2019   Procedure: STENT REMOVAL;  Surgeon: Ladene Artist, MD;  Location: [facility] ENDOSCOPY;  Service: Endoscopy;;    Medications: Prior to Admission medications   Medication Sig Start Date End Date Taking? Authorizing Provider  atenolol (TENORMIN) 100 MG tablet Take 1 tablet (100 mg total) by mouth daily. Hold until follow-up with your PCP Patient taking differently: Take 100 mg by mouth daily.  09/28/19  Yes Rai, Ripudeep K, MD  cetirizine (ZYRTEC) 10 MG tablet Take 10 mg by mouth daily.   Yes [provider]  ferrous sulfate 325 (65 FE) MG tablet Take 1 tablet (325 mg total) by mouth 2 (two) times daily with a meal. 10/06/19  Yes Kc, Ramesh, MD  hydrocortisone (ANUSOL-HC) 2.5 % rectal cream Apply 1 application topically 4 (four) times daily as needed for hemorrhoids. 09/28/19  Yes Rai, Ripudeep K, MD  ondansetron (ZOFRAN ODT) 4 MG disintegrating tablet Take 1 tablet (4 mg total) by mouth every 8 (eight) hours as needed for nausea or vomiting. 09/28/19  Yes Rai, Ripudeep K, MD  potassium chloride SA (KLOR-CON) 20 MEQ tablet Take 2 tablets (40 mEq total) by mouth daily. 08/30/19  Yes Truitt Merle, MD  amoxicillin-clavulanate (AUGMENTIN) 875-125 MG tablet Take 1 tablet by mouth 2 (two) times daily. Patient not taking: Reported on 12/04/2019 10/06/19   Antonieta Pert, MD  amoxicillin-clavulanate (AUGMENTIN) 875-125 MG tablet Take 1 tablet by mouth 2 (two) times daily. Patient not taking: Reported on 12/04/2019 10/20/19   Rosiland Oz, MD  diphenoxylate-atropine (LOMOTIL) 2.5-0.025 MG tablet Take 1-2 tablets by mouth 4 (four) times daily as needed for diarrhea or loose  stools. Patient not taking: Reported on 11/03/2019 07/14/19   Truitt Merle, MD  guaifenesin (HUMIBID E) 400 MG TABS tablet Take 1 tablet (400 mg total) by mouth every 6 (six) hours as needed. Patient not taking: Reported on 11/03/2019 05/13/19   Geradine Girt, DO  lidocaine-prilocaine (EMLA) cream Apply 1 application topically as needed. Patient not taking: Reported on 11/03/2019 06/23/19   Truitt Merle, MD  loperamide (IMODIUM) 2 MG capsule Take 1-2 capsules (2-4 mg total) by mouth every 6 (six) hours as needed for diarrhea or loose stools. Patient not taking: Reported on 12/04/2019 07/14/19   Truitt Merle, MD  magnesium oxide (MAG-OX) 400 (241.3 Mg) MG tablet Take 1 tablet (400 mg total) by mouth daily. Patient not taking: Reported on 11/03/2019 05/14/19   Geradine Girt, DO  methocarbamol (ROBAXIN) 500 MG tablet Take 1 tablet (500 mg total) by mouth every 6 (six) hours as needed for muscle spasms. Patient not taking: Reported on  12/04/2019 09/28/19   Rai, Ripudeep K, MD  pantoprazole (PROTONIX) 40 MG tablet Take 1 tablet (40 mg total) by mouth daily. Patient not taking: Reported on 12/04/2019 09/28/19 01/26/20  Rai, Vernelle Emerald, MD  traMADol (ULTRAM) 50 MG tablet Take 1 tablet (50 mg total) by mouth every 8 (eight) hours as needed for up to 4 doses for moderate pain or severe pain. Patient not taking: Reported on 12/04/2019 10/06/19   Antonieta Pert, MD    Allergies:  No Known Allergies  Social History:  reports that he has never smoked. He has never used smokeless tobacco. He reports current alcohol use of about 22.0 standard drinks of alcohol per week. He reports previous drug use.  Family History: Family History  Problem Relation Age of Onset  . Lymphoma Mother 69       cancer in lymph nodes, unsure of primary  . Heart attack Father   . Lupus Sister      Objective   Physical Exam: Blood pressure (!) 151/73, pulse 71, temperature 97.8 F (36.6 C), temperature source Oral, resp. rate 17, weight 70.3  kg, SpO2 98 %.  Physical Exam Vitals and nursing note reviewed. Exam conducted with a chaperone present.  Constitutional:      Appearance: Normal appearance.     Comments: Chronically ill-appearing, frail  HENT:     Head: Normocephalic.     Comments: Temporal wasting Eyes:     Conjunctiva/sclera: Conjunctivae normal.  Cardiovascular:     Rate and Rhythm: Normal rate and regular rhythm.  Pulmonary:     Effort: Pulmonary effort is normal.     Breath sounds: Normal breath sounds. No wheezing.  Abdominal:     General: Abdomen is flat. A surgical scar is present.     Palpations: Abdomen is soft.     Tenderness: There is abdominal tenderness in the right upper quadrant and epigastric area.  Genitourinary:    Comments: Urine is dark with red tinge Musculoskeletal:        General: No swelling or tenderness.  Skin:    Comments: Appears slightly jaundiced  Neurological:     Mental Status: He is alert. Mental status is at baseline.  Psychiatric:        Mood and Affect: Mood normal.        Behavior: Behavior normal.     LABS on Admission: I have personally reviewed all the labs and imaging below    Basic Metabolic Panel: Recent Labs  Lab 12/04/19 0619  NA 131*  K 3.6  CL 101  CO2 22  GLUCOSE 126*  BUN 8  CREATININE 0.71  CALCIUM 8.0*   Liver Function Tests: Recent Labs  Lab 12/04/19 0619  AST 36  ALT 16  ALKPHOS 242*  BILITOT 4.0*  PROT 5.8*  ALBUMIN 2.1*   Recent Labs  Lab 12/04/19 0619  LIPASE 17   No results for input(s): AMMONIA in the last 168 hours. CBC: Recent Labs  Lab 12/04/19 0619  WBC 7.1  NEUTROABS 5.6  HGB 8.8*  HCT 27.5*  MCV 83.6  PLT 120*   Cardiac Enzymes: No results for input(s): CKTOTAL, CKMB, CKMBINDEX, TROPONINI in the last 168 hours. BNP: Invalid input(s): POCBNP CBG: No results for input(s): GLUCAP in the last 168 hours.  Radiological Exams on Admission:  CT ABDOMEN PELVIS W CONTRAST  Result Date: 12/04/2019 CLINICAL  DATA:  Right upper quadrant abdominal pain. History of pancreatic carcinoma. EXAM: CT ABDOMEN AND PELVIS WITH CONTRAST TECHNIQUE: Multidetector CT  imaging of the abdomen and pelvis was performed using the standard protocol following bolus administration of intravenous contrast. CONTRAST:  14m OMNIPAQUE IOHEXOL 300 MG/ML  SOLN COMPARISON:  11/16/2019 and older CTs. FINDINGS: Lower chest: Small left pleural effusion. Mild dependent atelectasis in the posterior left lower lobe. Small area of opacity in the peripheral right lower lobe consistent with atelectasis or scarring, similar to the prior study. Hepatobiliary: Liver normal in size. Multiple small hypoattenuating liver masses consistent with metastatic disease. These all appear subcentimeter in size. These appear increased compared to the prior CT. Reference measurement of a lesion at the posterior aspect of segment 7 currently measures 8 mm, 3-4 mm on the prior CT. There is intrahepatic biliary air that is unchanged. Gallbladder is distended with dependent stones and non dependent air, similar to the prior CT. A biliary stent extends from the peripheral porta hepatis to the ampulla of Vater, unchanged. Pancreas: Pancreatic mass is not well-defined. Most of the pancreas shows depressed enhancement. There are peripancreatic lymph nodes, largest 1.4 cm in short axis, consistent with metastatic disease. These findings are stable from the prior CT. Spleen: Peripheral, wedge-shaped hypoattenuating area is mid to upper spleen measuring 16 x 12 mm, in not evident on the prior CT, likely a small infarct. Spleen normal in size with no other lesions. Adrenals/Urinary Tract: No adrenal masses. Kidneys normal in overall size, orientation and position with symmetric enhancement and excretion. Low-density renal masses consistent with cysts, larger the left, are stable. No hydronephrosis. Normal ureters. Normal bladder. Stomach/Bowel: Stomach is unremarkable. Small bowel and  colon are normal in caliber. No wall thickening. No evidence of inflammation. Multiple sigmoid diverticula without diverticulitis. Vascular/Lymphatic: Possible deep venous thrombosis at the level of the left common femoral vein also suggested in the left deep femoral vein. This could be from incomplete opacification and not thrombus. Mild aortic atherosclerosis.  No aneurysm. Portal vein is compressed by the pancreatic mass, with the confluence of the portal vein, splenic vein and superior mesenteric vein mostly effaced. This is similar to the prior CT. Porta hepatis adenopathy, stable from the prior CT. No other enlarged lymph nodes. Reproductive: Unremarkable. Other: Small to moderate amount of ascites, unchanged from the prior CT. Musculoskeletal: Chronic mild compression fracture of T12. No acute fractures. No osteoblastic or osteolytic lesions. IMPRESSION: 1. No definite acute abnormality within the abdomen or pelvis. 2. Gallbladder is distended with dependent stones and non dependent air, but no definite wall thickening. Early acute cholecystitis felt unlikely given the stable appearance from the prior CT, although not excluded. 3. Findings of pancreatic carcinoma and metastatic disease including peripancreatic metastatic adenopathy, multiple small liver masses and ascites. Small liver masses appear increased when compared to the prior CT consistent with metastatic disease progression. 4. New small wedge-shaped area of hypoattenuation in the spleen. Suspect a small infarct. 5. Possible deep venous thrombosis of the left lower extremity in the common femoral and deep femoral veins. Consider follow-up left lower extremity venous duplex exam if there are clinical findings supporting deep venous thrombosis. Electronically Signed   By: DLajean ManesM.D.   On: 12/04/2019 07:29   VAS [place]OWER EXTREMITY VENOUS (DVT) (ONLY [facility] & [facility])  Result Date: 12/04/2019  Lower Venous DVTStudy Indications: F/U CT findings.   Risk Factors: Cancer malignant neoplasm of pancreas. Comparison Study: No prior studies. Performing Technologist: RDarlin Coco RDMS  Examination Guidelines: A complete evaluation includes B-mode imaging, spectral Doppler, color Doppler, and power Doppler as needed of all accessible  portions of each vessel. Bilateral testing is considered an integral part of a complete examination. Limited examinations for reoccurring indications may be performed as noted. The reflux portion of the exam is performed with the patient in reverse Trendelenburg.  +-----+---------------+---------+-----------+----------+--------------+ RIGHTCompressibilityPhasicitySpontaneityPropertiesThrombus Aging +-----+---------------+---------+-----------+----------+--------------+ CFV  Full           Yes      Yes                                 +-----+---------------+---------+-----------+----------+--------------+   +---------+---------------+---------+-----------+----------+--------------+ LEFT     CompressibilityPhasicitySpontaneityPropertiesThrombus Aging +---------+---------------+---------+-----------+----------+--------------+ CFV      Full           Yes      Yes                                 +---------+---------------+---------+-----------+----------+--------------+ SFJ      Full                                                        +---------+---------------+---------+-----------+----------+--------------+ FV Prox  Full                                                        +---------+---------------+---------+-----------+----------+--------------+ FV Mid   Full                                                        +---------+---------------+---------+-----------+----------+--------------+ FV DistalFull                                                        +---------+---------------+---------+-----------+----------+--------------+ PFV      Partial        Yes      Yes                   Acute          +---------+---------------+---------+-----------+----------+--------------+ POP      Full           Yes      Yes                                 +---------+---------------+---------+-----------+----------+--------------+ PTV      Full                                                        +---------+---------------+---------+-----------+----------+--------------+ PERO     Full                                                        +---------+---------------+---------+-----------+----------+--------------+  Summary: RIGHT: - No evidence of common femoral vein obstruction.  LEFT: - Findings consistent with acute deep vein thrombosis involving the left proximal profunda vein. - No cystic structure found in the popliteal fossa.  *See table(s) above for measurements and observations.    Preliminary       EKG: Not done   A & P   Active Problems:   Malignant neoplasm of pancreas (HCC)   Anemia of chronic disease   RUQ abdominal pain   Hyponatremia   Splenic infarct   Acute deep vein thrombosis (DVT) of left lower extremity ([place])   1. Abdominal pain, multifactorial: Cancer-related pain, splenic infarct a. On heparin drip. Discussed with Dr. Jana Hakim, hematology/oncology, can start on Lovenox at discharge b. Continue with current pain regimen  2. Left lower extremity proximal profunda, common femoral and deep femoral veins DVT a. Anticoagulation as above b. Awaiting callback from IR to see if the patient is a candidate for thrombectomy i. Addendum: discussed with Dr. Earleen Newport, IR, who mentioned the patient is not a candidate for IR intervention and to continue anticoagulation. Also, he mentioned the patient has Pulmonary Emboli at the bases which was noted on the CT abdomen which was not mentioned in the radiology report. Will hold off on a CT Chest for now since he is already being anticoagulated and this would not likely change treatment.    3. Elevated total bilirubin with history of CBD obstruction and CBD stent a. US paracentesis already scheduled for tomorrow b. GI consulted  4. Metastatic pancreatic cancer to liver and lymph nodes a. Liver mets appear to be slightly larger on CT scan b. Oncology consulted  5. Recent hepatic abscess s/p drain removal on 10/13, stable a. Afebrile and without signs of infection  6. Hyponatremia a. IV fluids  7. Cachexia a. Dietary consult  8. Anemia of chronic disease, stable   DVT prophylaxis: Heparin   Code Status: Prior  Diet: Regular Family Communication: Admission, patients condition and plan of care including tests being ordered have been discussed with the patient who indicates understanding and agrees with the plan and Code Status. Patient's daughter was updated  Disposition Plan: The appropriate patient status for this patient is INPATIENT. Inpatient status is judged to be reasonable and necessary in order to provide the required intensity of service to ensure the patient's safety. The patient's presenting symptoms, physical exam findings, and initial radiographic and laboratory data in the context of their chronic comorbidities is felt to place them at high risk for further clinical deterioration. Furthermore, it is not anticipated that the patient will be medically stable for discharge from the hospital within 2 midnights of admission. The following factors support the patient status of inpatient.   " The patient's presenting symptoms include abdominal pain. " The worrisome physical exam findings include cachexia, jaundice. " The initial radiographic and laboratory data are worrisome because of splenic infarct and left lower extremity DVT. " The chronic co-morbidities include metastatic pancreatic cancer.   * I certify that at the point of admission it is my clinical judgment that the patient will require inpatient hospital care spanning beyond 2 midnights from the point  of admission due to high intensity of service, high risk for further deterioration and high frequency of surveillance required.*   Status is: Inpatient  Remains inpatient appropriate because:Ongoing active pain requiring inpatient pain management, IV treatments appropriate due to intensity of illness or inability to take PO and Inpatient level of care appropriate due to  severity of illness   Dispo: The patient is from: Home              Anticipated d/c is to: Home              Anticipated d/c date is: 3 days              Patient currently is not medically stable to d/c.     The medical decision making on this patient was of high complexity and the patient is at high risk for clinical deterioration, therefore this is a level 3  admission.  Consultants  . GI . We will discuss with IR . Palliative care  Procedures  . None  Time Spent on Admission: 70 minutes    Harold Hedge, DO [facility]  12/04/2019, 11:53 AM

## 2019-12-04 NOTE — ED Notes (Signed)
Family at bedside. 

## 2019-12-04 NOTE — Consult Note (Addendum)
Consultation  Referring Provider: Elvina Sidle, ER, MD/Segal  primary Care Physician:  Pcp, No Primary Gastroenterologist:  Dr. Havery Moros  Reason for Consultation: Elevated bilirubin in patient with metastatic pancreatic cancer  HPI: Isaac Doyle is a 64 y.o. male, who was diagnosed with pancreatic adenocarcinoma April 2021, and underwent ERCP with stent placement at that time for malignant biliary stricture.  EUS showed a large pancreatic mass encasing the portal vein and SMA. He required ERCP and stent exchange in August 2021 and was treated for a sending cholangitis at that time..  At ERCP he was noted to have duodenal deformity secondary to pancreatic malignancy, and had an uncovered metal stent placed. He required readmission 09/30/2019 with increased abdominal pain and was found to have a large biloma/hepatic abscess along the right liver and required percutaneous drainage per IR.  He had the percutaneous drain removed a couple of weeks ago. He had follow-up CT on 11/18/2019 per Dr. Burr Medico with finding of persistent large pancreatic mass encasing the portal vein with portal vein stenosis, common bile duct stent in place, decreased size of biloma, also noted multiple gallstones. Further treatment with potential radiation was to be discussed at tumor board. Presents now with increasing right-sided abdominal pain over the past couple of days with radiation to the back and the chest.  No associated nausea vomiting no diarrhea fever chills.  He is hemodynamically stable in the ER.  Patient says he has not been able to eat much recently, just small bites here and there and says he fills up quickly.  Again no vomiting.  Labs show WBC 7.1, hemoglobin 8.8, platelets 120 T bili 4.0/alk phos 242/AST 36/ALT 16  Most recent chemistries 10/20/2018 1T bili 0.9/alk phos 159/AST 36  CT of the abdomen pelvis has been done that shows multiple hepatic masses all subcentimeter, distended gallbladder with  multiple gallstones, CBD stent in place, there are peripancreatic nodes and portal adenopathy.  He has a splenic infarct and left femoral DVT.  The portal vein is noted to be compressed by the pancreatic mass.  He is to be started on heparin drip.  Past Medical History:  Diagnosis Date  . Hypertension     Past Surgical History:  Procedure Laterality Date  . BILIARY STENT PLACEMENT  05/11/2019   Procedure: BILIARY STENT PLACEMENT;  Surgeon: Irene Shipper, MD;  Location: [facility] ENDOSCOPY;  Service: Endoscopy;;  . BILIARY STENT PLACEMENT  06/03/2019   Procedure: BILIARY STENT PLACEMENT;  Surgeon: Jackquline Denmark, MD;  Location: [facility] ENDOSCOPY;  Service: Endoscopy;;  . BILIARY STENT PLACEMENT N/A 09/24/2019   Procedure: BILIARY STENT PLACEMENT;  Surgeon: Ladene Artist, MD;  Location: [facility] ENDOSCOPY;  Service: Endoscopy;  Laterality: N/A;  . ERCP N/A 05/11/2019   Procedure: ENDOSCOPIC RETROGRADE CHOLANGIOPANCREATOGRAPHY (ERCP);  Surgeon: Irene Shipper, MD;  Location: [facility] ENDOSCOPY;  Service: Endoscopy;  Laterality: N/A;  with   . ERCP N/A 06/03/2019   Procedure: ENDOSCOPIC RETROGRADE CHOLANGIOPANCREATOGRAPHY (ERCP);  Surgeon: Jackquline Denmark, MD;  Location: [facility] ENDOSCOPY;  Service: Endoscopy;  Laterality: N/A;  . ERCP N/A 09/24/2019   Procedure: ENDOSCOPIC RETROGRADE CHOLANGIOPANCREATOGRAPHY (ERCP);  Surgeon: Ladene Artist, MD;  Location: [facility] ENDOSCOPY;  Service: Endoscopy;  Laterality: N/A;  . ESOPHAGOGASTRODUODENOSCOPY (EGD) WITH PROPOFOL N/A 05/19/2019   Procedure: ESOPHAGOGASTRODUODENOSCOPY (EGD) WITH PROPOFOL;  Surgeon: [name], MD;  Location: [facility] ENDOSCOPY;  Service: Endoscopy;  Laterality: N/A;  . EUS N/A 05/19/2019   Procedure: UPPER ENDOSCOPIC ULTRASOUND (EUS) LINEAR;  Surgeon: Owens Loffler  P, MD;  Location: [facility] ENDOSCOPY;  Service: Endoscopy;  Laterality: N/A;  . FINE NEEDLE ASPIRATION N/A 05/19/2019   Procedure: FINE NEEDLE ASPIRATION (FNA) LINEAR;  Surgeon: [name], MD;  Location: [facility]  ENDOSCOPY;  Service: Endoscopy;  Laterality: N/A;  . IR IMAGING GUIDED PORT INSERTION  06/08/2019  . IR RADIOLOGIST EVAL & MGMT  10/19/2019  . IR RADIOLOGIST EVAL & MGMT  11/02/2019  . IR RADIOLOGIST EVAL & MGMT  11/16/2019  . REMOVAL OF STONES  09/24/2019   Procedure: REMOVAL OF STONES;  Surgeon: Ladene Artist, MD;  Location: [facility] ENDOSCOPY;  Service: Endoscopy;;  . [name]  05/11/2019   Procedure: SPHINCTEROTOMY;  Surgeon: Irene Shipper, MD;  Location: [facility] ENDOSCOPY;  Service: Endoscopy;;  . [name] REMOVAL  06/03/2019   Procedure: STENT REMOVAL;  Surgeon: Jackquline Denmark, MD;  Location: [facility] ENDOSCOPY;  Service: Endoscopy;;  . STENT REMOVAL  09/24/2019   Procedure: STENT REMOVAL;  Surgeon: Ladene Artist, MD;  Location: [facility] ENDOSCOPY;  Service: Endoscopy;;    Prior to Admission medications   Medication Sig Start Date End Date Taking? Authorizing Provider  atenolol (TENORMIN) 100 MG tablet Take 1 tablet (100 mg total) by mouth daily. Hold until follow-up with your PCP Patient taking differently: Take 100 mg by mouth daily.  09/28/19  Yes Rai, Ripudeep K, MD  cetirizine (ZYRTEC) 10 MG tablet Take 10 mg by mouth daily.   Yes [provider]  ferrous sulfate 325 (65 FE) MG tablet Take 1 tablet (325 mg total) by mouth 2 (two) times daily with a meal. 10/06/19  Yes Kc, Ramesh, MD  hydrocortisone (ANUSOL-HC) 2.5 % rectal cream Apply 1 application topically 4 (four) times daily as needed for hemorrhoids. 09/28/19  Yes Rai, Ripudeep K, MD  ondansetron (ZOFRAN ODT) 4 MG disintegrating tablet Take 1 tablet (4 mg total) by mouth every 8 (eight) hours as needed for nausea or vomiting. 09/28/19  Yes Rai, Ripudeep K, MD  potassium chloride SA (KLOR-CON) 20 MEQ tablet Take 2 tablets (40 mEq total) by mouth daily. 08/30/19  Yes Truitt Merle, MD  amoxicillin-clavulanate (AUGMENTIN) 875-125 MG tablet Take 1 tablet by mouth 2 (two) times daily. Patient not taking: Reported on 12/04/2019 10/06/19   Antonieta Pert, MD    amoxicillin-clavulanate (AUGMENTIN) 875-125 MG tablet Take 1 tablet by mouth 2 (two) times daily. Patient not taking: Reported on 12/04/2019 10/20/19   Rosiland Oz, MD  diphenoxylate-atropine (LOMOTIL) 2.5-0.025 MG tablet Take 1-2 tablets by mouth 4 (four) times daily as needed for diarrhea or loose stools. Patient not taking: Reported on 11/03/2019 07/14/19   Truitt Merle, MD  guaifenesin (HUMIBID E) 400 MG TABS tablet Take 1 tablet (400 mg total) by mouth every 6 (six) hours as needed. Patient not taking: Reported on 11/03/2019 05/13/19   Geradine Girt, DO  lidocaine-prilocaine (EMLA) cream Apply 1 application topically as needed. Patient not taking: Reported on 11/03/2019 06/23/19   Truitt Merle, MD  loperamide (IMODIUM) 2 MG capsule Take 1-2 capsules (2-4 mg total) by mouth every 6 (six) hours as needed for diarrhea or loose stools. Patient not taking: Reported on 12/04/2019 07/14/19   Truitt Merle, MD  magnesium oxide (MAG-OX) 400 (241.3 Mg) MG tablet Take 1 tablet (400 mg total) by mouth daily. Patient not taking: Reported on 11/03/2019 05/14/19   Geradine Girt, DO  methocarbamol (ROBAXIN) 500 MG tablet Take 1 tablet (500 mg total) by mouth every 6 (six) hours as needed for muscle spasms. Patient not  taking: Reported on 12/04/2019 09/28/19   Rai, Vernelle Emerald, MD  pantoprazole (PROTONIX) 40 MG tablet Take 1 tablet (40 mg total) by mouth daily. Patient not taking: Reported on 12/04/2019 09/28/19 01/26/20  Rai, Vernelle Emerald, MD  traMADol (ULTRAM) 50 MG tablet Take 1 tablet (50 mg total) by mouth every 8 (eight) hours as needed for up to 4 doses for moderate pain or severe pain. Patient not taking: Reported on 12/04/2019 10/06/19   Antonieta Pert, MD    Current Facility-Administered Medications  Medication Dose Route Frequency Provider Last Rate Last Admin  . heparin ADULT infusion 100 units/mL (25000 units/228m sodium chloride 0.45%)  1,300 Units/hr Intravenous Continuous LAdrian Saran RPH 13 mL/hr at  12/04/19 1149 1,300 Units/hr at 12/04/19 1149  . sodium chloride (PF) 0.9 % injection            Current Outpatient Medications  Medication Sig Dispense Refill  . atenolol (TENORMIN) 100 MG tablet Take 1 tablet (100 mg total) by mouth daily. Hold until follow-up with your PCP (Patient taking differently: Take 100 mg by mouth daily. ) 30 tablet 0  . cetirizine (ZYRTEC) 10 MG tablet Take 10 mg by mouth daily.    . ferrous sulfate 325 (65 FE) MG tablet Take 1 tablet (325 mg total) by mouth 2 (two) times daily with a meal.  3  . hydrocortisone (ANUSOL-HC) 2.5 % rectal cream Apply 1 application topically 4 (four) times daily as needed for hemorrhoids. 30 g 2  . ondansetron (ZOFRAN ODT) 4 MG disintegrating tablet Take 1 tablet (4 mg total) by mouth every 8 (eight) hours as needed for nausea or vomiting. 20 tablet 0  . potassium chloride SA (KLOR-CON) 20 MEQ tablet Take 2 tablets (40 mEq total) by mouth daily. 28 tablet 1  . amoxicillin-clavulanate (AUGMENTIN) 875-125 MG tablet Take 1 tablet by mouth 2 (two) times daily. (Patient not taking: Reported on 12/04/2019)  0  . amoxicillin-clavulanate (AUGMENTIN) 875-125 MG tablet Take 1 tablet by mouth 2 (two) times daily. (Patient not taking: Reported on 12/04/2019) 28 tablet 2  . diphenoxylate-atropine (LOMOTIL) 2.5-0.025 MG tablet Take 1-2 tablets by mouth 4 (four) times daily as needed for diarrhea or loose stools. (Patient not taking: Reported on 11/03/2019) 90 tablet 0  . guaifenesin (HUMIBID E) 400 MG TABS tablet Take 1 tablet (400 mg total) by mouth every 6 (six) hours as needed. (Patient not taking: Reported on 11/03/2019) 84 tablet   . lidocaine-prilocaine (EMLA) cream Apply 1 application topically as needed. (Patient not taking: Reported on 11/03/2019) 30 g 0  . loperamide (IMODIUM) 2 MG capsule Take 1-2 capsules (2-4 mg total) by mouth every 6 (six) hours as needed for diarrhea or loose stools. (Patient not taking: Reported on 12/04/2019) 90 capsule 1    . magnesium oxide (MAG-OX) 400 (241.3 Mg) MG tablet Take 1 tablet (400 mg total) by mouth daily. (Patient not taking: Reported on 11/03/2019)    . methocarbamol (ROBAXIN) 500 MG tablet Take 1 tablet (500 mg total) by mouth every 6 (six) hours as needed for muscle spasms. (Patient not taking: Reported on 12/04/2019) 90 tablet 0  . pantoprazole (PROTONIX) 40 MG tablet Take 1 tablet (40 mg total) by mouth daily. (Patient not taking: Reported on 12/04/2019) 30 tablet 3  . traMADol (ULTRAM) 50 MG tablet Take 1 tablet (50 mg total) by mouth every 8 (eight) hours as needed for up to 4 doses for moderate pain or severe pain. (Patient not taking: Reported on 12/04/2019)  4 tablet 0    Allergies as of 12/04/2019  . (No Known Allergies)    Family History  Problem Relation Age of Onset  . Lymphoma Mother 41       cancer in lymph nodes, unsure of primary  . Heart attack Father   . Lupus Sister     Social History   Socioeconomic History  . Marital status: Divorced    Spouse name: Not on file  . Number of children: 1  . Years of education: Not on file  . Highest education level: Not on file  Occupational History  . Occupation: disabled veteran   Tobacco Use  . Smoking status: Never Smoker  . Smokeless tobacco: Never Used  . Tobacco comment: plan to quit completely   Substance and Sexual Activity  . Alcohol use: Yes    Alcohol/week: 22.0 standard drinks    Types: 12 Cans of beer, 10 Shots of liquor per week  . Drug use: Not Currently  . Sexual activity: Not on file  Other Topics Concern  . Not on file  Social History Narrative  . Not on file   Social Determinants of Health   Financial Resource Strain:   . Difficulty of Paying Living Expenses: Not on file  Food Insecurity:   . Worried About Charity fundraiser in the Last Year: Not on file  . Ran Out of Food in the Last Year: Not on file  Transportation Needs:   . Lack of Transportation (Medical): Not on file  . Lack of  Transportation (Non-Medical): Not on file  Physical Activity:   . Days of Exercise per Week: Not on file  . Minutes of Exercise per Session: Not on file  Stress:   . Feeling of Stress : Not on file  Social Connections:   . Frequency of Communication with Friends and Family: Not on file  . Frequency of Social Gatherings with Friends and Family: Not on file  . Attends Religious Services: Not on file  . Active Member of Clubs or Organizations: Not on file  . Attends Archivist Meetings: Not on file  . Marital Status: Not on file  Intimate Partner Violence:   . Fear of Current or Ex-Partner: Not on file  . Emotionally Abused: Not on file  . Physically Abused: Not on file  . Sexually Abused: Not on file    Review of Systems: Pertinent positive and negative review of systems were noted in the above HPI section.  All other review of systems was otherwise negative.  Physical Exam: Vital signs in last 24 hours: Temp:  [97.8 F (36.6 C)] 97.8 F (36.6 C) (10/31 0457) Pulse Rate:  [70-89] 70 (10/31 1207) Resp:  [16-19] 18 (10/31 1207) BP: (131-160)/(73-92) 160/92 (10/31 1207) SpO2:  [95 %-100 %] 98 % (10/31 1207) Weight:  [70.3 kg] 70.3 kg (10/31 0456)   General:   Alert,  Well-developed, chronically ill-appearing thin older African-American male pleasant and cooperative in NAD Head:  Normocephalic and atraumatic. Eyes:  Sclera icteric conjunctiva pink. Ears:  Normal auditory acuity. Nose:  No deformity, discharge,  or lesions. Mouth:  No deformity or lesions.   Neck:  Supple; no masses or thyromegaly. Lungs:  Clear throughout to auscultation.   No wheezes, crackles, or rhonchi. Heart:  Regular rate and rhythm; no murmurs, clicks, rubs,  or gallops. Abdomen:  Soft, full feeling, no tense ascites, he is tender in the right upper quadrant and epigastrium Rectal: Not done Msk:  Symmetrical without gross deformities. . Pulses:  Normal pulses noted. Extremities:  Without  clubbing or edema. Neurologic:  Alert and  oriented x4;  grossly normal neurologically. Skin:  Intact without significant lesions or rashes.. Psych:  Alert and cooperative. Normal mood and affect.  Intake/Output from previous day: No intake/output data recorded. Intake/Output this shift: No intake/output data recorded.  Lab Results: Recent Labs    12/04/19 0619  WBC 7.1  HGB 8.8*  HCT 27.5*  PLT 120*   BMET Recent Labs    12/04/19 0619  NA 131*  K 3.6  CL 101  CO2 22  GLUCOSE 126*  BUN 8  CREATININE 0.71  CALCIUM 8.0*   LFT Recent Labs    12/04/19 0619  PROT 5.8*  ALBUMIN 2.1*  AST 36  ALT 16  ALKPHOS 242*  BILITOT 4.0*      IMPRESSION:  #29 64 year old African-American male, diagnosed with pancreatic adenocarcinoma spring 2021 with associated malignant common bile duct stricture.  He has undergone most recent ERCP with stent exchange for uncovered metal stent and August 2021 at which time he was also treated for a sending cholangitis. Subsequent to that he developed a large biloma/hepatic abscess which was managed with percutaneous drainage.  PERC drain removed a couple of weeks ago. Patient had been undergoing chemotherapy, but has not resumed chemo since the abscess. Patient presents now with acute progressive right upper quadrant pain radiating into his back over the past couple of days.  No associated fever nausea or vomiting. CT imaging now shows multiple hepatic metastases, porta hepatis adenopathy, peripancreatic adenopathy, ascites, persistent pancreatic mass, distended gallbladder with stones, and previously placed stent is present into the porta hepatis.  T bili up to 4.0.  Unclear at present if the hyperbilirubinemia is secondary to multiple hepatic metastases, portal adenopathy, and/or progressive CBD stricture. No current evidence for cholangitis.  #2 portal vein compression by pancreatic mass #3.  DVT left common femoral left deep femoral #4  gallstones with distended gallbladder  Plan; patient has been started on heparin Pain control Will discuss with biliary team, Dr. Carlean Purl to review.  He may require repeat ERCP. Trend LFTs Discuss  case with Dr. Burr Medico Thank you  We will follow with you    Amy Esterwood PA-C 12/04/2019, 12:45 PM  I have seen and evaluated the patient as well, history corroborated physical exam performed.  I agree with Ms. Genia Harold assessment and plan.  At this point I do not see a role for biliary endoscopy intervention.  Based upon my interpretation of the images as well as the radiologist report it looks like the stent is functioning properly.    I am afraid his progressive cancer with what appears to be growing metastases in the liver not withstanding new hypercoagulability with DVT is driving his symptoms and signs that led to presentation to the emergency department.  He does have cholelithiasis and a distended gallbladder so cholecystitis is possible although I think unlikely I will cover with antibiotics.  I do not think a HIDA scan would give [place] useful information given the liver metastases.  Depending upon what is decided I think the next imaging step would be MRCP.  It will be key to get oncology input from Dr. Burr Medico.  The [facility] inpatient team will follow up again tomorrow.  Dr. Lyndel Safe is leading the team next week  I have started him on full liquids.   Gatha Mayer, MD, [facility] 12/04/2019 2:44 PM

## 2019-12-04 NOTE — ED Provider Notes (Signed)
  Provider Note MRN:  [id number]  Arrival date & time: 12/04/19    ED Course and Medical Decision Making  Assumed care from Dr. Roxanne Mins at shift change.  Pancreatic cancer, abdominal pain, CT with question of small splenic infarct, question of DVT, will obtain ultrasound and reassess patient's pain to determine need for admission versus discharge.  Has follow-up tomorrow for paracentesis.   CT and ultrasound reveal left lower extremity VT as well as splenic infarct, patient continues to have pain.  Admitted to medicine for further management and further goals of care discussions. Procedures  Final Clinical Impressions(s) / ED Diagnoses     ICD-10-CM   1. Abdominal pain, unspecified abdominal location  R10.9   2. Malignant neoplasm of head of pancreas ([place])  C25.0   3. Acute deep vein thrombosis (DVT) of proximal vein of left lower extremity ([facility])  I82.4Y2   4. Splenic infarct  D73.5     ED Discharge Orders    None      Discharge Instructions   None     Barth Kirks. [place]onia Small, [place] [email or web address]    Maudie Flakes, MD 12/04/19 1039

## 2019-12-04 NOTE — ED Notes (Signed)
ED TO INPATIENT HANDOFF REPORT  ED Nurse Name and Phone #: [phone]  S Name/Age/Gender Isaac Doyle 64 y.o. male Room/Bed: WA16/WA16  Code Status   Code Status: Prior  Home/SNF/Other Home Patient oriented to: self, place, time and situation Is this baseline? Yes   Triage Complete: Triage complete  Chief Complaint Abdominal pain [R10.9]  Triage Note Pt to ER via EMS complaining of RUQ abdominal pain.  PT has pancreatic cancer.  Pt states that he had similar pain a couple of weeks ago and had an infection.    Allergies No Known Allergies  Level of Care/Admitting Diagnosis ED Disposition    ED Disposition Condition [facility] Area: [place] [[id number]]  Level of Care: Med-Surg [16]  May admit patient to [facility] or [facility] if equivalent level of care is available:: Yes  Covid Evaluation: Asymptomatic Screening Protocol (No Symptoms)  Diagnosis: Abdominal pain [409735]  Admitting Physician: Harold Hedge [[id number]]  Attending Physician: Harold Hedge [[id number]]  Estimated length of stay: past midnight tomorrow  Certification:: I certify this patient will need inpatient services for at least 2 midnights       B Medical/Surgery History Past Medical History:  Diagnosis Date  . Hypertension    Past Surgical History:  Procedure Laterality Date  . BILIARY STENT PLACEMENT  05/11/2019   Procedure: BILIARY STENT PLACEMENT;  Surgeon: Irene Shipper, MD;  Location: [facility] ENDOSCOPY;  Service: Endoscopy;;  . BILIARY STENT PLACEMENT  06/03/2019   Procedure: BILIARY STENT PLACEMENT;  Surgeon: Jackquline Denmark, MD;  Location: [facility] ENDOSCOPY;  Service: Endoscopy;;  . BILIARY STENT PLACEMENT N/A 09/24/2019   Procedure: BILIARY STENT PLACEMENT;  Surgeon: Ladene Artist, MD;  Location: [facility] ENDOSCOPY;  Service: Endoscopy;  Laterality: N/A;  . ERCP N/A 05/11/2019   Procedure: ENDOSCOPIC RETROGRADE CHOLANGIOPANCREATOGRAPHY (ERCP);  Surgeon: Irene Shipper, MD;  Location: [facility] ENDOSCOPY;  Service: Endoscopy;  Laterality: N/A;  with   . ERCP N/A 06/03/2019   Procedure: ENDOSCOPIC RETROGRADE CHOLANGIOPANCREATOGRAPHY (ERCP);  Surgeon: Jackquline Denmark, MD;  Location: [facility] ENDOSCOPY;  Service: Endoscopy;  Laterality: N/A;  . ERCP N/A 09/24/2019   Procedure: ENDOSCOPIC RETROGRADE CHOLANGIOPANCREATOGRAPHY (ERCP);  Surgeon: Ladene Artist, MD;  Location: [facility] END[facility];  Service: Endoscopy;  Laterality: N/A;  . ESOPHAGOGASTRODUODENOSCOPY (EGD) WITH PROPOFOL N/A 05/19/2019   Procedure: ESOPHAGOGASTRODUODENOSCOPY (EGD) WITH PROPOFOL;  Surgeon: [name], MD;  Location: [facility] ENDOSCOPY;  Service: Endoscopy;  Laterality: N/A;  . EUS N/A 05/19/2019   Procedure: UPPER ENDOSCOPIC ULTRASOUND (EUS) LINEAR;  Surgeon: [name], MD;  Location: [facility] ENDOSCOPY;  Service: Endoscopy;  Laterality: N/A;  . FINE NEEDLE ASPIRATION N/A 05/19/2019   Procedure: FINE NEEDLE ASPIRATION (FNA) LINEAR;  Surgeon: [name], MD;  Location: [facility] ENDOSCOPY;  Service: Endoscopy;  Laterality: N/A;  . IR IMAGING GUIDED PORT INSERTION  06/08/2019  . IR RADIOLOGIST EVAL & MGMT  10/19/2019  . IR RADIOLOGIST EVAL & MGMT  11/02/2019  . IR RADIOLOGIST EVAL & MGMT  11/16/2019  . REMOVAL OF STONES  09/24/2019   Procedure: REMOVAL OF STONES;  Surgeon: Ladene Artist, MD;  Location: [facility] ENDOSCOPY;  Service: Endoscopy;;  . Isaac Doyle  05/11/2019   Procedure: Isaac Doyle;  Surgeon: Irene Shipper, MD;  Location: [facility] ENDOSCOPY;  Service: Endoscopy;;  . [name] REMOVAL  06/03/2019   Procedure: STENT REMOVAL;  Surgeon: Jackquline Denmark, MD;  Location: [facility] ENDOSCOPY;  Service: Endoscopy;;  . STENT REMOVAL  09/24/2019  Procedure: STENT REMOVAL;  Surgeon: Ladene Artist, MD;  Location: [facility] ENDOSCOPY;  Service: Endoscopy;;     A IV Location/Drains/Wounds Patient Lines/Drains/Airways Status    Active Line/Drains/Airways    Name Placement date Placement time Site Days   Implanted Port 06/08/19 Right  Chest 06/08/19  1144  Chest  179   Implanted Port 12/04/19 12/04/19  0616  --  less than 1   Closed System Drain 1 Lateral RLQ Bulb (JP) 12 Fr. 09/30/19  1217  RLQ  65   GI Stent 8.5 Fr. 06/03/19  1155  --  184   GI Stent 8 Fr. 09/24/19  0850  --  71          Intake/Output Last 24 hours No intake or output data in the 24 hours ending 12/04/19 1456  Labs/Imaging Results for orders placed or performed during the hospital encounter of 12/04/19 (from the past 48 hour(s))  Comprehensive metabolic panel     Status: Abnormal   Collection Time: 12/04/19  6:19 AM  Result Value Ref Range   Sodium 131 (L) 135 - 145 mmol/L   Potassium 3.6 3.5 - 5.1 mmol/L   Chloride 101 98 - 111 mmol/L   CO2 22 22 - 32 mmol/L   Glucose, Bld 126 (H) 70 - 99 mg/dL    Comment: Glucose reference range applies only to samples taken after fasting for at least 8 hours.   BUN 8 8 - 23 mg/dL   Creatinine, Ser 0.71 0.61 - 1.24 mg/dL   Calcium 8.0 (L) 8.9 - 10.3 mg/dL   Total Protein 5.8 (L) 6.5 - 8.1 g/dL   Albumin 2.1 (L) 3.5 - 5.0 g/dL   AST 36 15 - 41 U/L   ALT 16 0 - 44 U/L   Alkaline Phosphatase 242 (H) 38 - 126 U/L   Total Bilirubin 4.0 (H) 0.3 - 1.2 mg/dL   GFR, Estimated >60 >60 mL/min    Comment: (NOTE) Calculated using the CKD-EPI Creatinine Equation (2021)    Anion gap 8 5 - 15    Comment: Performed at [facility], [place] [address] [place], [place] [address]  Lipase, blood     Status: None   Collection Time: 12/04/19  6:19 AM  Result Value Ref Range   Lipase 17 11 - 51 U/L    Comment: Performed at [facility], [place] [address] [place], [place] [address]  CBC with Differential     Status: Abnormal   Collection Time: 12/04/19  6:19 AM  Result Value Ref Range   WBC 7.1 4.0 - 10.5 K/uL   RBC 3.29 (L) 4.22 - 5.81 MIL/uL   Hemoglobin 8.8 (L) 13.0 - 17.0 g/dL   HCT 27.5 (L) 39 - 52 %   MCV 83.6 80.0 - 100.0 fL   MCH 26.7 26.0 - 34.0 pg   MCHC 32.0 30.0 - 36.0  g/dL   RDW 15.9 (H) 11.5 - 15.5 %   Platelets 120 (L) 150 - 400 K/uL    Comment: REPEATED TO VERIFY Immature Platelet Fraction may be clinically indicated, consider ordering this additional test [id number]    nRBC 0.0 0.0 - 0.2 %   Neutrophils Relative % 79 %   Neutro Abs 5.6 1.7 - 7.7 K/uL   Lymphocytes Relative 13 %   Lymphs Abs 0.9 0.7 - 4.0 K/uL   Monocytes Relative 7 %   Monocytes Absolute 0.5 0.1 - 1.0 K/uL   Eosinophils Relative 0 %  Eosinophils Absolute 0.0 0.0 - 0.5 K/uL   Basophils Relative 0 %   Basophils Absolute 0.0 0.0 - 0.1 K/uL   Immature Granulocytes 1 %   Abs Immature Granulocytes 0.04 0.00 - 0.07 K/uL    Comment: Performed at [facility], [place] [address] [place], [place] [address]   CT ABDOMEN PELVIS W CONTRAST  Result Date: 12/04/2019 CLINICAL DATA:  Right upper quadrant abdominal pain. History of pancreatic carcinoma. EXAM: CT ABDOMEN AND PELVIS WITH CONTRAST TECHNIQUE: Multidetector CT imaging of the abdomen and pelvis was performed using the standard protocol following bolus administration of intravenous contrast. CONTRAST:  198mL OMNIPAQUE IOHEXOL 300 MG/ML  SOLN COMPARISON:  11/16/2019 and older CTs. FINDINGS: Lower chest: Small left pleural effusion. Mild dependent atelectasis in the posterior left lower lobe. Small area of opacity in the peripheral right lower lobe consistent with atelectasis or scarring, similar to the prior study. Hepatobiliary: Liver normal in size. Multiple small hypoattenuating liver masses consistent with metastatic disease. These all appear subcentimeter in size. These appear increased compared to the prior CT. Reference measurement of a lesion at the posterior aspect of segment 7 currently measures 8 mm, 3-4 mm on the prior CT. There is intrahepatic biliary air that is unchanged. Gallbladder is distended with dependent stones and non dependent air, similar to the prior CT. A biliary stent extends from the peripheral  porta hepatis to the ampulla of Vater, unchanged. Pancreas: Pancreatic mass is not well-defined. Most of the pancreas shows depressed enhancement. There are peripancreatic lymph nodes, largest 1.4 cm in short axis, consistent with metastatic disease. These findings are stable from the prior CT. Spleen: Peripheral, wedge-shaped hypoattenuating area is mid to upper spleen measuring 16 x 12 mm, in not evident on the prior CT, likely a small infarct. Spleen normal in size with no other lesions. Adrenals/Urinary Tract: No adrenal masses. Kidneys normal in overall size, orientation and position with symmetric enhancement and excretion. Low-density renal masses consistent with cysts, larger the left, are stable. No hydronephrosis. Normal ureters. Normal bladder. Stomach/Bowel: Stomach is unremarkable. Small bowel and colon are normal in caliber. No wall thickening. No evidence of inflammation. Multiple sigmoid diverticula without diverticulitis. Vascular/Lymphatic: Possible deep venous thrombosis at the level of the left common femoral vein also suggested in the left deep femoral vein. This could be from incomplete opacification and not thrombus. Mild aortic atherosclerosis.  No aneurysm. Portal vein is compressed by the pancreatic mass, with the confluence of the portal vein, splenic vein and superior mesenteric vein mostly effaced. This is similar to the prior CT. Porta hepatis adenopathy, stable from the prior CT. No other enlarged lymph nodes. Reproductive: Unremarkable. Other: Small to moderate amount of ascites, unchanged from the prior CT. Musculoskeletal: Chronic mild compression fracture of T12. No acute fractures. No osteoblastic or osteolytic lesions. IMPRESSION: 1. No definite acute abnormality within the abdomen or pelvis. 2. Gallbladder is distended with dependent stones and non dependent air, but no definite wall thickening. Early acute cholecystitis felt unlikely given the stable appearance from the prior  CT, although not excluded. 3. Findings of pancreatic carcinoma and metastatic disease including peripancreatic metastatic adenopathy, multiple small liver masses and ascites. Small liver masses appear increased when compared to the prior CT consistent with metastatic disease progression. 4. New small wedge-shaped area of hypoattenuation in the spleen. Suspect a small infarct. 5. Possible deep venous thrombosis of the left lower extremity in the common femoral and deep femoral veins. Consider follow-up left lower extremity venous  duplex exam if there are clinical findings supporting deep venous thrombosis. Electronically Signed   By: Lajean Manes M.D.   On: 12/04/2019 07:29   VAS [place] LOWER EXTREMITY VENOUS (DVT) (ONLY [facility] & [facility])  Result Date: 12/04/2019  Lower Venous DVTStudy Indications: F/U CT findings.  Risk Factors: Cancer malignant neoplasm of pancreas. Comparison Study: No prior studies. Performing Technologist: Darlin Coco, RDMS  Examination Guidelines: A complete evaluation includes B-mode imaging, spectral Doppler, color Doppler, and power Doppler as needed of all accessible portions of each vessel. Bilateral testing is considered an integral part of a complete examination. Limited examinations for reoccurring indications may be performed as noted. The reflux portion of the exam is performed with the patient in reverse Trendelenburg.  +-----+---------------+---------+-----------+----------+--------------+ RIGHTCompressibilityPhasicitySpontaneityPropertiesThrombus Aging +-----+---------------+---------+-----------+----------+--------------+ CFV  Full           Yes      Yes                                 +-----+---------------+---------+-----------+----------+--------------+   +---------+---------------+---------+-----------+----------+--------------+ LEFT     CompressibilityPhasicitySpontaneityPropertiesThrombus Aging  +---------+---------------+---------+-----------+----------+--------------+ CFV      Full           Yes      Yes                                 +---------+---------------+---------+-----------+----------+--------------+ SFJ      Full                                                        +---------+---------------+---------+-----------+----------+--------------+ FV Prox  Full                                                        +---------+---------------+---------+-----------+----------+--------------+ FV Mid   Full                                                        +---------+---------------+---------+-----------+----------+--------------+ FV DistalFull                                                        +---------+---------------+---------+-----------+----------+--------------+ PFV      Partial        Yes      Yes                  Acute          +---------+---------------+---------+-----------+----------+--------------+ POP      Full           Yes      Yes                                 +---------+---------------+---------+-----------+----------+--------------+  PTV      Full                                                        +---------+---------------+---------+-----------+----------+--------------+ PERO     Full                                                        +---------+---------------+---------+-----------+----------+--------------+     Summary: RIGHT: - No evidence of common femoral vein obstruction.  LEFT: - Findings consistent with acute deep vein thrombosis involving the left proximal profunda vein. - No cystic structure found in the popliteal fossa.  *See table(s) above for measurements and observations. Electronically signed by Deitra Mayo MD on 12/04/2019 at 2:08:26 PM.    Final     Pending Labs Unresulted Labs (From admission, onward)          Start     Ordered   12/05/19 0500  CBC  Daily,   R       12/04/19 1052   12/05/19 0500  Comprehensive metabolic panel  Tomorrow morning,   R        12/04/19 1309   12/05/19 0500  CBC  Tomorrow morning,   R        12/04/19 1309   12/04/19 1730  Heparin level (unfractionated)  Once-Timed,   STAT        12/04/19 1052   Signed and Held  Basic metabolic panel  Tomorrow morning,   R        Signed and Held   Signed and Held  CBC  Tomorrow morning,   R        Signed and Held          Vitals/Pain Today's Vitals   12/04/19 1310 12/04/19 1400 12/04/19 1430 12/04/19 1434  BP: 135/80 (!) 148/66 138/71 138/71  Pulse: 67 63 68 69  Resp: 18   18  Temp:      TempSrc:      SpO2: 98% 98% 97% 98%  Weight:      PainSc:        Isolation Precautions No active isolations  Medications Medications  sodium chloride (PF) 0.9 % injection (has no administration in time range)  heparin ADULT infusion 100 units/mL (25000 units/232mL sodium chloride 0.45%) (1,300 Units/hr Intravenous New Bag/Given 12/04/19 1149)  ondansetron (ZOFRAN) injection 4 mg (has no administration in time range)  piperacillin-tazobactam (ZOSYN) IVPB 3.375 g (has no administration in time range)  piperacillin-tazobactam (ZOSYN) IVPB 3.375 g (has no administration in time range)  lactated ringers bolus 1,000 mL (0 mLs Intravenous Stopped 12/04/19 1141)  ondansetron (ZOFRAN) injection 4 mg (4 mg Intravenous Given 12/04/19 0645)  morphine 4 MG/ML injection 4 mg (4 mg Intravenous Given 12/04/19 0646)  iohexol (OMNIPAQUE) 300 MG/ML solution 100 mL (100 mLs Intravenous Contrast Given 12/04/19 0656)  morphine 4 MG/ML injection 4 mg (4 mg Intravenous Given 12/04/19 1140)  heparin bolus via infusion 2,000 Units (2,000 Units Intravenous Bolus from Bag 12/04/19 1151)    Mobility walks with device Low fall risk   Focused Assessments .   R Recommendations: See Admitting Provider Note  Report  given to:   Additional Notes: n/a

## 2019-12-04 NOTE — Progress Notes (Signed)
ANTICOAGULATION CONSULT NOTE - Initial Consult  Pharmacy Consult for IV heparin Indication: DVT  No Known Allergies  Patient Measurements: Weight: 70.3 kg (155 lb) Heparin Dosing Weight: 70.3 kg  Vital Signs: Temp: 97.8 F (36.6 C) (10/31 0457) Temp Source: Oral (10/31 0457) BP: 156/76 (10/31 1004) Pulse Rate: 70 (10/31 1004)  Labs: Recent Labs    12/04/19 0619  HGB 8.8*  HCT 27.5*  PLT 120*  CREATININE 0.71    Estimated Creatinine Clearance: 91.5 mL/min (by C-G formula based on SCr of 0.71 mg/dL).   Medical History: Past Medical History:  Diagnosis Date  . Hypertension     Medications:  Scheduled:  .  morphine injection  4 mg Intravenous Once  . sodium chloride (PF)       Infusions:    Assessment: 64 yo male with hx pancreatic cancer and was supposed to have paracentesis tomorrow for further evaluation of cancer presents to ED with abdominal pain. Per Dopplers, has new DVT to start IV heparin per Rx dosing. Baseline labs obtained. Patient not on any anticoag meds prior to admission  Goal of Therapy:  Heparin level 0.3-0.7 units/ml Monitor platelets by anticoagulation protocol: Yes   Plan:   IV heparin 2000 unit bolus then  IV heparin infusion rate of 1300 units/hr  Check heparin level 6 hours after start of heparin  Daily CBC  Kara Mead 12/04/2019,10:47 AM

## 2019-12-04 NOTE — Progress Notes (Signed)
Pharmacy Antibiotic Note  Jaret Coppedge is a 64 y.o. male admitted on 12/04/2019 with intra-abd infection.  Pharmacy has been consulted for Zosyn dosing.  Plan: Zosyn 3.375g IV q8h (4 hour infusion).  Weight: 70.3 kg (155 lb)  Temp (24hrs), Avg:97.8 F (36.6 C), Min:97.8 F (36.6 C), Max:97.8 F (36.6 C)  Recent Labs  Lab 12/04/19 0619  WBC 7.1  CREATININE 0.71    Estimated Creatinine Clearance: 91.5 mL/min (by C-G formula based on SCr of 0.71 mg/dL).    No Known Allergies  Antimicrobials this admission: 10/31 Zosyn >>   Dose adjustments this admission:  Microbiology results: None ordered at this time  Thank you for allowing pharmacy to be a part of this patient's care.  [place], [place] [phone] 12/04/2019 2:50 PM

## 2019-12-05 ENCOUNTER — Ambulatory Visit (HOSPITAL_COMMUNITY): Admission: RE | Admit: 2019-12-05 | Payer: No Typology Code available for payment source | Source: Ambulatory Visit

## 2019-12-05 ENCOUNTER — Inpatient Hospital Stay (HOSPITAL_COMMUNITY): Payer: No Typology Code available for payment source

## 2019-12-05 ENCOUNTER — Telehealth: Payer: Self-pay

## 2019-12-05 DIAGNOSIS — R188 Other ascites: Secondary | ICD-10-CM | POA: Diagnosis not present

## 2019-12-05 DIAGNOSIS — C250 Malignant neoplasm of head of pancreas: Secondary | ICD-10-CM

## 2019-12-05 DIAGNOSIS — C787 Secondary malignant neoplasm of liver and intrahepatic bile duct: Secondary | ICD-10-CM | POA: Diagnosis not present

## 2019-12-05 DIAGNOSIS — E871 Hypo-osmolality and hyponatremia: Secondary | ICD-10-CM

## 2019-12-05 DIAGNOSIS — Z7189 Other specified counseling: Secondary | ICD-10-CM

## 2019-12-05 DIAGNOSIS — R1011 Right upper quadrant pain: Secondary | ICD-10-CM

## 2019-12-05 DIAGNOSIS — C259 Malignant neoplasm of pancreas, unspecified: Secondary | ICD-10-CM

## 2019-12-05 DIAGNOSIS — R109 Unspecified abdominal pain: Secondary | ICD-10-CM

## 2019-12-05 DIAGNOSIS — I824Y2 Acute embolism and thrombosis of unspecified deep veins of left proximal lower extremity: Secondary | ICD-10-CM | POA: Diagnosis not present

## 2019-12-05 DIAGNOSIS — D735 Infarction of spleen: Secondary | ICD-10-CM

## 2019-12-05 DIAGNOSIS — D638 Anemia in other chronic diseases classified elsewhere: Secondary | ICD-10-CM

## 2019-12-05 LAB — CBC
HCT: 27 % — ABNORMAL LOW (ref 39.0–52.0)
Hemoglobin: 8.6 g/dL — ABNORMAL LOW (ref 13.0–17.0)
MCH: 27.1 pg (ref 26.0–34.0)
MCHC: 31.9 g/dL (ref 30.0–36.0)
MCV: 85.2 fL (ref 80.0–100.0)
Platelets: 114 10*3/uL — ABNORMAL LOW (ref 150–400)
RBC: 3.17 MIL/uL — ABNORMAL LOW (ref 4.22–5.81)
RDW: 16 % — ABNORMAL HIGH (ref 11.5–15.5)
WBC: 5.2 10*3/uL (ref 4.0–10.5)
nRBC: 0 % (ref 0.0–0.2)

## 2019-12-05 LAB — RESPIRATORY PANEL BY RT PCR (FLU A&B, COVID)
Influenza A by PCR: NEGATIVE
Influenza B by PCR: NEGATIVE
SARS Coronavirus 2 by RT PCR: NEGATIVE

## 2019-12-05 LAB — COMPREHENSIVE METABOLIC PANEL
ALT: 12 U/L (ref 0–44)
AST: 19 U/L (ref 15–41)
Albumin: 2 g/dL — ABNORMAL LOW (ref 3.5–5.0)
Alkaline Phosphatase: 187 U/L — ABNORMAL HIGH (ref 38–126)
Anion gap: 7 (ref 5–15)
BUN: 9 mg/dL (ref 8–23)
CO2: 24 mmol/L (ref 22–32)
Calcium: 8 mg/dL — ABNORMAL LOW (ref 8.9–10.3)
Chloride: 102 mmol/L (ref 98–111)
Creatinine, Ser: 0.86 mg/dL (ref 0.61–1.24)
GFR, Estimated: >60 mL/min (ref >60)
Glucose, Bld: 113 mg/dL — ABNORMAL HIGH (ref 70–99)
Potassium: 4.8 mmol/L (ref 3.5–5.1)
Sodium: 133 mmol/L — ABNORMAL LOW (ref 135–145)
Total Bilirubin: 2.5 mg/dL — ABNORMAL HIGH (ref 0.3–1.2)
Total Protein: 5.3 g/dL — ABNORMAL LOW (ref 6.5–8.1)

## 2019-12-05 LAB — HEPARIN LEVEL (UNFRACTIONATED)
Heparin Unfractionated: 0.47 IU/mL (ref 0.30–0.70)
Heparin Unfractionated: 0.21 IU/mL — ABNORMAL LOW (ref 0.30–0.70)

## 2019-12-05 MED ORDER — SODIUM CHLORIDE 0.9 % IV SOLN: INTRAVENOUS | Status: DC

## 2019-12-05 MED ORDER — PANTOPRAZOLE SODIUM 40 MG PO TBEC
40.0000 mg | DELAYED_RELEASE_TABLET | Freq: Every day | ORAL | Status: DC
  Administered 2019-12-05 – 2019-12-08 (×4): 40 mg via ORAL
  Filled 2019-12-05 (×4): qty 1

## 2019-12-05 MED ORDER — BOOST / RESOURCE BREEZE PO LIQD CUSTOM
1.0000 | Freq: Three times a day (TID) | ORAL | Status: DC
  Administered 2019-12-05 – 2019-12-06 (×3): 1 via ORAL

## 2019-12-05 MED ORDER — SENNOSIDES-DOCUSATE SODIUM 8.6-50 MG PO TABS: 1.0000 | ORAL_TABLET | Freq: Every day | ORAL | Status: DC

## 2019-12-05 MED ORDER — LIDOCAINE HCL 1 % IJ SOLN
INTRAMUSCULAR | Status: AC
  Filled 2019-12-05: qty 20

## 2019-12-05 MED ORDER — PROSOURCE PLUS PO LIQD
30.0000 mL | Freq: Two times a day (BID) | ORAL | Status: DC
  Administered 2019-12-05 – 2019-12-08 (×5): 30 mL via ORAL
  Filled 2019-12-05 (×5): qty 30

## 2019-12-05 MED ORDER — SENNOSIDES-DOCUSATE SODIUM 8.6-50 MG PO TABS
1.0000 | ORAL_TABLET | Freq: Two times a day (BID) | ORAL | Status: DC
  Administered 2019-12-05 – 2019-12-06 (×2): 1 via ORAL
  Filled 2019-12-05 (×4): qty 1

## 2019-12-05 MED ORDER — ADULT MULTIVITAMIN W/MINERALS CH
1.0000 | ORAL_TABLET | Freq: Every day | ORAL | Status: DC
  Administered 2019-12-05 – 2019-12-08 (×4): 1 via ORAL
  Filled 2019-12-05 (×4): qty 1

## 2019-12-05 NOTE — Procedures (Signed)
Ultrasound-guided diagnostic and therapeutic paracentesis performed yielding 1.5 liters of yellow fluid. No immediate complications. A portion of the fluid was sent to the lab for cytology. EBL none.

## 2019-12-05 NOTE — Progress Notes (Signed)
Chaplain offered visit of support to patient and he welcomed her.  Patient say he had 2 bags of liquid just taken from abdomen was feeling better.  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 "I just got bad news." He has decided that he's "gonna fight it" because he has not been as close to his daughter as he would have liked to have been and he wants to spend time with them.  His daughter is 77 and is willing to help him through his cancer journey. Chaplain offered ministry of presence and prayer, and patient enjoined with her in saying words of scripture and prayer.  "Thank you, Chaplain" he said, as she departed. Rev. Tamsen Snider Pager [phone]

## 2019-12-05 NOTE — Evaluation (Signed)
Physical Therapy Evaluation Patient Details Name: Isaac Doyle MRN: [id number] DOB: 1955/11/01 Today's Date: 12/05/2019   History of Present Illness  64 year old male with medical history of metastatic pancreatic cancer (patient of Dr. 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 He presented to the ED with RUQ abdominal pain x2 days. Pain radiated to back and chest. No N/V/D or fever or chills.    Clinical Impression  Isaac Doyle is 64 y.o. male admitted with above HPI and diagnosis. Patient is currently limited by functional impairments below (see PT problem list). Patient lives with his daugther and is independent with RW for mobility at baseline. He required min assist/guard today for transfers and gait with RW. Patient will benefit from continued skilled PT interventions to address impairments and progress independence with mobility, recommending pt continue with [facility] PT/OT and [place] aide services. Acute PT will follow and progress as able.     Follow Up Recommendations Home health PT [facility] PT/OT and Aides to continue)    Equipment Recommendations  None recommended by PT    Recommendations for Other Services       Precautions / Restrictions Precautions Precautions: Fall Restrictions Weight Bearing Restrictions: No      Mobility  Bed Mobility Overal bed mobility: Needs Assistance Bed Mobility: Supine to Sit     Supine to sit: Min guard;Supervision;HOB elevated     General bed mobility comments: no assist requires, pt taking extra time and using bed rail.    Transfers Overall transfer level: Needs assistance Equipment used: Rolling walker (2 wheeled) Transfers: Sit to/from Stand Sit to Stand: Min assist;Min guard         General transfer comment: cues for safe hand placement, light assist and close guard for rising from EOB.   Ambulation/Gait Ambulation/Gait assistance: Min assist Gait Distance (Feet): 70  Feet Assistive device: Rolling walker (2 wheeled) Gait Pattern/deviations: Step-through pattern;Decreased stride length;Trunk flexed Gait velocity: decr   General Gait Details: cues for safe proximity to RW, assist to steady inermittently with close guard throughout.  Stairs            Wheelchair Mobility    Modified Rankin (Stroke Patients Only)       Balance Overall balance assessment: Needs assistance Sitting-balance support: Feet supported Sitting balance-Leahy Scale: Good     Standing balance support: Bilateral upper extremity supported;During functional activity Standing balance-Leahy Scale: Fair                               Pertinent Vitals/Pain Pain Assessment: No/denies pain    Home Living Family/patient expects to be discharged to:: Private residence Living Arrangements: Children Available Help at Discharge: Family Type of Home: House Home Access: Stairs to enter Entrance Stairs-Rails: Can reach both Entrance Stairs-Number of Steps: 2 Home Layout: One level Home Equipment: Cane - single point;Walker - 2 wheels;Shower seat;Bedside commode;Toilet riser ([facility]ing [facility] another Rollator) Additional Comments: pt moved in with his daughter. He has [place] PT/OT and [place] Aides.    Prior Function Level of Independence: Independent with assistive device(s)         Comments: Amb with RW or walking stick at home. Pt reports independence with ADL's.     Hand Dominance   Dominant Hand: Right    Extremity/Trunk Assessment   Upper Extremity Assessment Upper Extremity Assessment: Overall WFL for tasks assessed  Lower Extremity Assessment Lower Extremity Assessment: Generalized weakness    Cervical / Trunk Assessment Cervical / Trunk Assessment: Kyphotic  Communication   Communication: No difficulties  Cognition Arousal/Alertness: Awake/alert Behavior During Therapy: WFL for tasks assessed/performed Overall Cognitive Status:  Within Functional Limits for tasks assessed                                        General Comments General comments (skin integrity, edema, etc.): discussed  HHPT and pt demo'd several exercises sitting in recliner: LAQ, hip abduction, bicep curls.    Exercises     Assessment/Plan    PT Assessment Patient needs continued PT services  PT Problem List Decreased strength;Decreased activity tolerance;Decreased balance;Decreased mobility;Decreased knowledge of use of DME       PT Treatment Interventions DME instruction;Gait training;Stair training;Functional mobility training;Therapeutic activities;Therapeutic exercise;Balance training;Patient/family education    PT Goals (Current goals can be found in the Care Plan section)  Acute Rehab PT Goals Patient Stated Goal: return home with daughter PT Goal Formulation: With patient Time For Goal Achievement: 12/19/19 Potential to Achieve Goals: Good    Frequency Min 3X/week   Barriers to discharge        Co-evaluation               AM-PAC PT "6 Clicks" Mobility  Outcome Measure Help needed turning from your back to your side while in a flat bed without using bedrails?: None Help needed moving from lying on your back to sitting on the side of a flat bed without using bedrails?: None Help needed moving to and from a bed to a chair (including a wheelchair)?: A Little Help needed standing up from a chair using your arms (e.g., wheelchair or bedside chair)?: A Little Help needed to walk in hospital room?: A Little Help needed climbing 3-5 steps with a railing? : A Little 6 Click Score: 20    End of Session Equipment Utilized During Treatment: Gait belt Activity Tolerance: Patient tolerated treatment well Patient left: in chair;with call bell/phone within reach;with chair alarm set   PT Visit Diagnosis: Muscle weakness (generalized) (M62.81);Unsteadiness on feet (R26.81);Difficulty in walking, not elsewhere  classified (R26.2)    Time: 4665-9935 PT Time Calculation (min) (ACUTE ONLY): 17 min   Charges:   PT Evaluation $PT Eval Low Complexity: 1 Low         Verner Mould, DPT Acute Rehabilitation Services  Office [phone] Pager [phone]  12/05/2019 6:44 PM

## 2019-12-05 NOTE — Progress Notes (Signed)
ANTICOAGULATION CONSULT NOTE Pharmacy Consult for IV heparin Indication: DVT  Patient Measurements: Height: 5\' 11"  (180.3 cm) Weight: 70.3 kg (155 lb) IBW/kg (Calculated) : 75.3 Heparin Dosing Weight: 70.3 kg  Labs: Recent Labs    12/04/19 0619 12/04/19 1652 12/04/19 2319 12/05/19 0338  HGB 8.8*  --   --  8.6*  HCT 27.5*  --   --  27.0*  PLT 120*  --   --  114*  HEPARINUNFRC  --  0.53 0.49 0.47  CREATININE 0.71  --   --  0.86     Assessment: 64 yo male with hx pancreatic cancer and was supposed to have paracentesis tomorrow for further evaluation of cancer presents to ED with abdominal pain. Per Dopplers, has new DVT to start IV heparin per Rx dosing.  First heparin level 0.53, therapeutic on heparin at 1300 units/hr No bleeding or complications reported.  12/05/2019  AM Heparin level  = 0.47 (therapeutic) with heparin gtt @ 1300 units/hr  CBC stable, PLTC 114, remains low  No complications of therapy noted  No bleeding reported  Goal of Therapy:  Heparin level 0.3-0.7 units/ml Monitor platelets by anticoagulation protocol: Yes   Plan:   Continue IV heparin infusion rate of 1300 units/hr  Daily heparin level & CBC  After all invasive procedures completed, consider transition to LMWH 1.5 mg/kg q24 = LMWH 100 mg q24 for long term rx in cancer pt  Eudelia Bunch, Pharm.D 12/05/2019 8:35 AM

## 2019-12-05 NOTE — Progress Notes (Signed)
Initial Nutrition Assessment  DOCUMENTATION CODES:   Severe malnutrition in context of chronic illness  INTERVENTION:  - will d/c Ensure Enlive per patient request. - will order Boost Breeze TID, each supplement provides 250 kcal and 9 grams of protein - will order 30 ml Prosource Plus BID, each supplement provides 100 kcal and 15 grams protein.  - will order 1 tablet multivitamin with minerals/day.    NUTRITION DIAGNOSIS:   Severe Malnutrition related to chronic illness, cancer and cancer related treatments as evidenced by moderate fat depletion, moderate muscle depletion, severe fat depletion, severe muscle depletion  GOAL:   Patient will meet greater than or equal to 90% of their needs  MONITOR:   PO intake, Supplement acceptance, Labs, Weight trends  REASON FOR ASSESSMENT:   Malnutrition Screening Tool, Consult Assessment of nutrition requirement/status  ASSESSMENT:   64 year old male with medical history of metastatic pancreatic cancer (patient of Dr. 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 He presented to the ED with RUQ abdominal pain x2 days. Pain radiated to back and chest. No N/V/D or fever or chills.  Per documentation, he consumed 0% of dinner last night and 30% of breakfast this AM. Ensure Enlive ordered BID and patient was noted to have accepted 1 of 2 bottles offered, but this bottle is sitting unopened on bedside table. He reports he used to drink Ensure supplements but that more recently they give him severe diarrhea so he is uninterested in trying to consume this supplement.  Patient has been see by other RDs in the health system, most recently on 8/28 by inpatient RD and 8/16 by [place] RD.  During admission in August, he had new onset difficulty with swallowing. He reports this has since resolved and he can eat whatever he'd like (gives the example of pizza) without any chewing or swallowing  difficulties. He usually only eats 2-3 bites/spoonfuls of food at a time and then feels full and needs to rest before attempting to eat more.  At home, he often drinks large quantities of gatorade/day. He will sometimes make smoothies with strawberries, bananas, and water. He does not drink milk or use yogurt in smoothies and does not add any protein sources to smoothies (he reports not liking peanut butter).   He states that abdominal pain is better today with being given pain medication. No N/V today or PTA. He denies any taste changes and states poor intakes are simply due to poor appetite and early satiety. He does enjoy snacking on Oreos.   Weight yesterday was documented as 155 lb, which appears to be a stated weight. Weight on 10/7 was 151 lb, weight on 8/20 was 153 lb, and weight on 7/5 was 162 lb. Non-pitting edema to BLE documented in the edema section of the flow sheet.   Patient reports living with his daughter and that he has an Engineer, production, nurse, and PT.   He reports being informed that his cancer has spread to his liver and that he is now deciding if he wants to pursue further chemo treatment.   Palliative Care met with him today and plans to follow-up 11/2.    Labs reviewed; Na: 133 mmol/l, Ca: 8 mg/dl, Alk Phos elevated.  Medications reviewed; 325 mg ferrous sulfate BID, 40 mg oral protonix/day, 40 mEq Klor-Con/day.     NUTRITION - FOCUSED PHYSICAL EXAM:    Most Recent Value  Orbital Region Moderate depletion  Upper Arm Region Severe depletion  Thoracic and Lumbar Region Unable to assess  Buccal Region Severe depletion  Temple Region Severe depletion  Clavicle Bone Region Severe depletion  Clavicle and Acromion Bone Region Severe depletion  Scapular Bone Region Moderate depletion  Dorsal Hand Moderate depletion  Patellar Region Unable to assess  Anterior Thigh Region Unable to assess  Posterior Calf Region Unable to assess  Edema (RD Assessment) Unable to assess  Hair  Reviewed  Eyes Reviewed  Mouth Reviewed  Skin Reviewed  Nails Reviewed       Diet Order:   Diet Order            Diet regular Room service appropriate? Yes; Fluid consistency: Thin  Diet effective now                 EDUCATION NEEDS:   No education needs have been identified at this time  Skin:  Skin Assessment: Reviewed RN Assessment  Last BM:  10/30  Height:   Ht Readings from Last 1 Encounters:  12/04/19 _0  (1.803 m)    Weight:   Wt Readings from Last 1 Encounters:  12/04/19 70.3 kg    Estimated Nutritional Needs:  Kcal:  2110-2320 kcal Protein:  105-120 grams Fluid:  >/= 2.2 L/day     Jarome Matin, MS, RD, LDN, CNSC Inpatient Clinical Dietitian RD pager # available in [facility]  After hours/weekend pager # available in [facility]

## 2019-12-05 NOTE — Telephone Encounter (Signed)
appt made with Dr Havery Moros on 01/10/20 at 810 am  Letter mailed to the pt

## 2019-12-05 NOTE — Progress Notes (Signed)
Pharmacy Brief Note - Anticoagulation Follow Up:  Pt currently on heparin infusion for DVT. Heparin paused at 1305 for paracentesis. Ok to resume heparin at this time per IR.   Will resume heparin at previous rate of 1300 units/hr. Check HL in 6 hours.  Lenis Noon, PharmD 12/05/19 4:26 PM

## 2019-12-05 NOTE — Progress Notes (Addendum)
Progress Note   Subjective  Chief Complaint: Elevated bilirubin and patient metastatic pancreatic cancer  This morning the patient is found lying comfortably in bed with his daughter by his bedside.  He tells me that overall he feels fairly well, he had a soft solid bowel movement just prior to me arriving and is tolerating his diet, denies any increase in abdominal pain, nausea or vomiting.  Overall they are just wondering what the plan is.   Objective   Vital signs in last 24 hours: Temp:  [97.6 F (36.4 C)-98.2 F (36.8 C)] 97.6 F (36.4 C) (11/01 0334) Pulse Rate:  [63-71] 64 (11/01 0857) Resp:  [14-18] 14 (11/01 0857) BP: (120-164)/(66-92) 137/73 (11/01 0857) SpO2:  [95 %-100 %] 100 % (11/01 0857) Last BM Date: 12/03/19 General:    AA male in NAD Heart:  Regular rate and rhythm; no murmurs Lungs: Respirations even and unlabored, lungs CTA bilaterally +port Abdomen:  Soft, mild epigastric/RUQ ttp and nondistended. Normal bowel sounds. Extremities:  Without edema. Neurologic:  Alert and oriented,  grossly normal neurologically. Psych:  Cooperative. Normal mood and affect.  Intake/Output from previous day: 10/31 0701 - 11/01 0700 In: 836.1 [P.O.:480; I.V.:256.1; IV Piggyback:100] Out: 550 [Urine:550] Intake/Output this shift: Total I/O In: 60 [P.O.:60] Out: -   Lab Results: Recent Labs    12/04/19 0619 12/05/19 0338  WBC 7.1 5.2  HGB 8.8* 8.6*  HCT 27.5* 27.0*  PLT 120* 114*   BMET Recent Labs    12/04/19 0619 12/05/19 0338  NA 131* 133*  K 3.6 4.8  CL 101 102  CO2 22 24  GLUCOSE 126* 113*  BUN 8 9  CREATININE 0.71 0.86  CALCIUM 8.0* 8.0*   LFT Recent Labs    12/05/19 0338  PROT 5.3*  ALBUMIN 2.0*  AST 19  ALT 12  ALKPHOS 187*  BILITOT 2.5*   Studies/Results: CT ABDOMEN PELVIS W CONTRAST  Result Date: 12/04/2019 CLINICAL DATA:  Right upper quadrant abdominal pain. History of pancreatic carcinoma. EXAM: CT ABDOMEN AND PELVIS WITH  CONTRAST TECHNIQUE: Multidetector CT imaging of the abdomen and pelvis was performed using the standard protocol following bolus administration of intravenous contrast. CONTRAST:  196mL OMNIPAQUE IOHEXOL 300 MG/ML  SOLN COMPARISON:  11/16/2019 and older CTs. FINDINGS: Lower chest: Small left pleural effusion. Mild dependent atelectasis in the posterior left lower lobe. Small area of opacity in the peripheral right lower lobe consistent with atelectasis or scarring, similar to the prior study. Hepatobiliary: Liver normal in size. Multiple small hypoattenuating liver masses consistent with metastatic disease. These all appear subcentimeter in size. These appear increased compared to the prior CT. Reference measurement of a lesion at the posterior aspect of segment 7 currently measures 8 mm, 3-4 mm on the prior CT. There is intrahepatic biliary air that is unchanged. Gallbladder is distended with dependent stones and non dependent air, similar to the prior CT. A biliary stent extends from the peripheral porta hepatis to the ampulla of Vater, unchanged. Pancreas: Pancreatic mass is not well-defined. Most of the pancreas shows depressed enhancement. There are peripancreatic lymph nodes, largest 1.4 cm in short axis, consistent with metastatic disease. These findings are stable from the prior CT. Spleen: Peripheral, wedge-shaped hypoattenuating area is mid to upper spleen measuring 16 x 12 mm, in not evident on the prior CT, likely a small infarct. Spleen normal in size with no other lesions. Adrenals/Urinary Tract: No adrenal masses. Kidneys normal in overall size, orientation and position with symmetric  enhancement and excretion. Low-density renal masses consistent with cysts, larger the left, are stable. No hydronephrosis. Normal ureters. Normal bladder. Stomach/Bowel: Stomach is unremarkable. Small bowel and colon are normal in caliber. No wall thickening. No evidence of inflammation. Multiple sigmoid diverticula  without diverticulitis. Vascular/Lymphatic: Possible deep venous thrombosis at the level of the left common femoral vein also suggested in the left deep femoral vein. This could be from incomplete opacification and not thrombus. Mild aortic atherosclerosis.  No aneurysm. Portal vein is compressed by the pancreatic mass, with the confluence of the portal vein, splenic vein and superior mesenteric vein mostly effaced. This is similar to the prior CT. Porta hepatis adenopathy, stable from the prior CT. No other enlarged lymph nodes. Reproductive: Unremarkable. Other: Small to moderate amount of ascites, unchanged from the prior CT. Musculoskeletal: Chronic mild compression fracture of T12. No acute fractures. No osteoblastic or osteolytic lesions. IMPRESSION: 1. No definite acute abnormality within the abdomen or pelvis. 2. Gallbladder is distended with dependent stones and non dependent air, but no definite wall thickening. Early acute cholecystitis felt unlikely given the stable appearance from the prior CT, although not excluded. 3. Findings of pancreatic carcinoma and metastatic disease including peripancreatic metastatic adenopathy, multiple small liver masses and ascites. Small liver masses appear increased when compared to the prior CT consistent with metastatic disease progression. 4. New small wedge-shaped area of hypoattenuation in the spleen. Suspect a small infarct. 5. Possible deep venous thrombosis of the left lower extremity in the common femoral and deep femoral veins. Consider follow-up left lower extremity venous duplex exam if there are clinical findings supporting deep venous thrombosis. Electronically Signed   By: Lajean Manes M.D.   On: 12/04/2019 07:29   VAS [place] LOWER EXTREMITY VENOUS (DVT) (ONLY [facility] & [facility])  Result Date: 12/04/2019  Lower Venous DVTStudy Indications: F/U CT findings.  Risk Factors: Cancer malignant neoplasm of pancreas. Comparison Study: No prior studies. Performing  Technologist: Darlin Coco, RDMS  Examination Guidelines: A complete evaluation includes B-mode imaging, spectral Doppler, color Doppler, and power Doppler as needed of all accessible portions of each vessel. Bilateral testing is considered an integral part of a complete examination. Limited examinations for reoccurring indications may be performed as noted. The reflux portion of the exam is performed with the patient in reverse Trendelenburg.  +-----+---------------+---------+-----------+----------+--------------+ RIGHTCompressibilityPhasicitySpontaneityPropertiesThrombus Aging +-----+---------------+---------+-----------+----------+--------------+ CFV  Full           Yes      Yes                                 +-----+---------------+---------+-----------+----------+--------------+   +---------+---------------+---------+-----------+----------+--------------+ LEFT     CompressibilityPhasicitySpontaneityPropertiesThrombus Aging +---------+---------------+---------+-----------+----------+--------------+ CFV      Full           Yes      Yes                                 +---------+---------------+---------+-----------+----------+--------------+ SFJ      Full                                                        +---------+---------------+---------+-----------+----------+--------------+ FV Prox  Full                                                        +---------+---------------+---------+-----------+----------+--------------+  FV Mid   Full                                                        +---------+---------------+---------+-----------+----------+--------------+ FV DistalFull                                                        +---------+---------------+---------+-----------+----------+--------------+ PFV      Partial        Yes      Yes                  Acute           +---------+---------------+---------+-----------+----------+--------------+ POP      Full           Yes      Yes                                 +---------+---------------+---------+-----------+----------+--------------+ PTV      Full                                                        +---------+---------------+---------+-----------+----------+--------------+ PERO     Full                                                        +---------+---------------+---------+-----------+----------+--------------+     Summary: RIGHT: - No evidence of common femoral vein obstruction.  LEFT: - Findings consistent with acute deep vein thrombosis involving the left proximal profunda vein. - No cystic structure found in the popliteal fossa.  *See table(s) above for measurements and observations. Electronically signed by Deitra Mayo MD on 12/04/2019 at 2:08:26 PM.    Final        Assessment / Plan:   Assessment: 1.  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 with associated malignant common bile duct stricture, underwent ERCP with stent exchange for uncovered metal stent in August 2021, also treated for ascending cholangitis, developed a large biloma/hepatic abscess which is managed with percutaneous drainage, drain removed a couple of weeks ago, currently not undergoing chemotherapy since abscess, presented with acute progressive right upper quadrant pain radiating to his back over the past couple of days, CT showing multiple hepatic metastasis, porta hepatis adenopathy, peripancreatic adenopathy, ascites, persistent pancreatic mass, distended gallbladder with stones and previously placed stent was present into the porta hepatis, LFTs were elevated but are currently trending down overnight 2.  Portal vein compression by pancreatic mass 3.  DVT left common femoral left deep femoral 4.  Gallstones with distended gallbladder  Plan: 1.  Continue analgesics 2.  There is no plan for  repeat ERCP at this time as it appears stent is patent. 3.  LFTs are trending down today, we would recommend keeping the patient on antibiotics  for a total of 10 days 4.  Awaiting hematology recommendations 5.  We will arrange for follow-up in our clinic with patient's physician in the next 3 to 4 weeks, we can order MRCP as outpatient if problem persists 6.  Please await any final recommendations from Dr. Lyndel Safe later today, we will sign off.  Thank you for kind consultation.   LOS: 1 day   Levin Erp  12/05/2019, 9:50 AM      Attending physician's note   I have taken an interval history, reviewed the chart and examined the patient. I agree with the Advanced Practitioner's note, impression and recommendations.   I have discussed with Dr Carlean Purl and agree with his assessment (see yesterday's note).  Pt with metastatic pancreatic AdenoCa with liver mets (extensive small mets).   CT reviewed. The uncovered biliary stent (placed 09/24/2019) is patent. No role for overlapping stent at this time.   Suggest to continue A/Bs x 10 days (for possibly low grade asc cholangitis with stent). Await hematology recommendations.  [facility] as outpt in 3-4 weeks. If persistent problems (rise in D Bili), can consider MRCP as out pt.  Will sign off for now. Pl call if with any ?   Carmell Austria, MD [facility]

## 2019-12-05 NOTE — Telephone Encounter (Signed)
-----   Message from [place], [place] sent at 12/05/2019  9:55 AM EDT ----- Regarding: needs follow up Needs follow up with Dr. Arm in 3-6 weeks in clinic for elevated lft/ruq pain/h/o pancreatic ca  Thanks-JLL
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
## 2019-12-05 NOTE — Progress Notes (Signed)
Pharmacy Brief Note - Anticoagulation Follow Up:  Pt currently on heparin infusion for DVT. Heparin was paused at 1305 for paracentesis and resumed at 1622 per IR  A/P: HL= 0.21 subtherapeutic No bleeding reported Increase heparin drip to 1450 units/hr Check heparin level in 6 hours  Dolly Rias RPh 12/05/2019, 11:27 PM

## 2019-12-05 NOTE — Progress Notes (Signed)
ANTICOAGULATION CONSULT NOTE - Brief note  Pharmacy Consult for IV heparin Indication: DVT  Patient Measurements: Height: 5\' 11"  (180.3 cm) Weight: 70.3 kg (155 lb) IBW/kg (Calculated) : 75.3 Heparin Dosing Weight: 70.3 kg  Labs: Recent Labs    12/04/19 0619 12/04/19 1652 12/04/19 2319  HGB 8.8*  --   --   HCT 27.5*  --   --   PLT 120*  --   --   HEPARINUNFRC  --  0.53 0.49  CREATININE 0.71  --   --      Medications:  Infusions:  . heparin 1,300 Units/hr (12/04/19 1801)  . piperacillin-tazobactam (ZOSYN)  IV 3.375 g (12/04/19 2316)    Assessment: 64 yo male with hx pancreatic cancer and was supposed to have paracentesis tomorrow for further evaluation of cancer presents to ED with abdominal pain. Per Dopplers, has new DVT to start IV heparin per Rx dosing.  First heparin level 0.53, therapeutic on heparin at 1300 units/hr No bleeding or complications reported.  PM Update:  Heparin level @ 23:19 = 0.49 (therapeutic) with heparin gtt @ 1300 units/hr  No complications of therapy noted  Goal of Therapy:  Heparin level 0.3-0.7 units/ml Monitor platelets by anticoagulation protocol: Yes   Plan:   Continue IV heparin infusion rate of 1300 units/hr  Daily heparin level & CBC  Leone Haven, PharmD 12/05/2019 12:13 AM

## 2019-12-05 NOTE — Consult Note (Signed)
Consultation Note Date: 12/05/2019   Patient Name: Isaac Doyle  DOB: [birth year]  MRN: [id number]  Age / Sex: 63 y.o., male  PCP: Pcp, No Referring Physician: Eugenie Filler, MD  Reason for Consultation: Establishing goals of care  HPI/Patient Profile: 64 y.o. 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  Palliative medicine consulted for assistance with goals of care.  Clinical Assessment and Goals of Care:  I have reviewed medical records including EPIC notes, labs and imaging, examined the patient and met at bedside with the patient to discuss diagnosis prognosis, GOC, EOL wishes, disposition and options.  I introduced Palliative Medicine as specialized medical care for people living with serious illness. It focuses on providing relief from the symptoms and stress of a serious illness.   We discussed a brief life review of the patient.  He is a veteran having spent time in [place] during [facility].  He lives at home with his daughter Isaac Doyle.  He is of the [employer].   As far as functional and nutritional status-Isaac Doyle has noticed a continuous decline since his diagnosis of cancer in April.  He has lost a significant amount of weight tells me that he first weighed 220 pounds and is now down to 155 pounds.  He is able to ambulate in his home but this is limited due to shortness of breath.  He completes  his own ADLs.  He used to enjoy cooking a great deal but is no longer able to do that.  He entertains himself with crossword puzzles and solitaire on his computer.  We discussed her current illness and what it means in the larger context of her on-going co-morbidities.  Natural disease trajectory and expectations at EOL were discussed.  Isaac Doyle is worried that he is at an end stage of his illness process.  He tells me that he feels like he is at the end of his life.  He has a strong faith and tells me he is not worried that he feels everything is in God's hands.  He shares that he would not want to die in the home for he worries that his family would not be able to live there after he died.  I attempted to elicit values and goals of care important to the patient.  He has a goal of trying to reconnect with a son and make things right with him.   The difference between aggressive medical intervention and comfort care was considered in light of the patient's goals of care.  Isaac Doyle tells me that chemotherapy was very hard on him.  If he is offered more chemotherapy he does not feel like he would wish to proceed and would prefer to be comfortable and allow cancer to take its natural course.  Advanced directives, concepts specific to code status, artifical feeding and hydration, and rehospitalization were considered and discussed.  [facility] has a DNR in place.  Questions and concerns were addressed.  The family was encouraged to call with questions or concerns.   Primary Decision Maker PATIENT    SUMMARY OF RECOMMENDATIONS -PMT will continue to follow and await recommendations from Dr. Burr Medico -We will follow patient tomorrow and discuss possible hospice services if he makes the decision not to proceed with treatment for his cancer -Currently his pain is controlled with current pain interventions-we will also follow along and make oral recommendations when time closer to discharge  Code Status/Advance Care  Planning:  DNR  Additional Recommendations (Limitations, Scope, Preferences):  Full Scope Treatment-for now pending discussion with Oncology  Psycho-social/Spiritual:   Desire for further Chaplaincy support:yes  Prognosis:    Unable to determine-less than 6 months if patient chooses comfort care route  Discharge Planning: To Be Determined  Primary Diagnoses: Present on Admission: . RUQ abdominal pain . Anemia of chronic disease   I have reviewed the medical record, interviewed the patient and family, and examined the patient. The following aspects are pertinent.  Past Medical History:  Diagnosis Date  . Hypertension    Social History   Socioeconomic History  . Marital status: Divorced    Spouse name: Not on file  . Number of children: 1  . Years of education: Not on file  . Highest education level: Not on file  Occupational History  . Occupation: disabled veteran   Tobacco Use  . Smoking status: Never Smoker  . Smokeless tobacco: Never Used  . Tobacco comment: plan to quit completely   Substance and Sexual Activity  . Alcohol use: Yes    Alcohol/week: 22.0 standard drinks    Types: 12 Cans of beer, 10 Shots of liquor per week  . Drug use: Not Currently  . Sexual activity: Not on file  Other Topics Concern  . Not on file  Social History Narrative  . Not on file   Social Determinants of Health   Financial Resource Strain:   . Difficulty of Paying Living Expenses: Not on file  Food Insecurity:   . Worried About Charity fundraiser in the Last Year: Not on file  . Ran Out of Food in the Last Year: Not on file  Transportation Needs:   . Lack of Transportation (Medical): Not on file  . Lack of Transportation (Non-Medical): Not on file  Physical Activity:   . Days of Exercise per Week: Not on file  . Minutes of Exercise per Session: Not on file  Stress:   . Feeling of Stress : Not on file  Social Connections:   . Frequency of Communication with Friends  and Family: Not on file  . Frequency of Social Gatherings with Friends and Family: Not on file  . Attends Religious Services: Not on file  . Active Member of Clubs or Organizations: Not on file  . Attends Archivist Meetings: Not on file  . Marital Status: Not on file   Scheduled Meds: . atenolol  100 mg Oral Daily  . Chlorhexidine Gluconate Cloth  6 each Topical Daily  . feeding supplement  237 mL Oral BID BM  . ferrous sulfate  325 mg Oral BID WC  .  pantoprazole  40 mg Oral Q0600  . potassium chloride SA  40 mEq Oral Daily   Continuous Infusions: . heparin 1,300 Units/hr (12/05/19 0538)  . piperacillin-tazobactam (ZOSYN)  IV 3.375 g (12/05/19 0810)   PRN Meds:.acetaminophen **OR** acetaminophen, HYDROcodone-acetaminophen, HYDROmorphone (DILAUDID) injection, ondansetron, ondansetron, polyethylene glycol, sodium chloride flush Medications Prior to Admission:  Prior to Admission medications   Medication Sig Start Date End Date Taking? Authorizing Provider  atenolol (TENORMIN) 100 MG tablet Take 1 tablet (100 mg total) by mouth daily. Hold until follow-up with your PCP Patient taking differently: Take 100 mg by mouth daily.  09/28/19  Yes Rai, Ripudeep K, MD  cetirizine (ZYRTEC) 10 MG tablet Take 10 mg by mouth daily.   Yes [provider]  ferrous sulfate 325 (65 FE) MG tablet Take 1 tablet (325 mg total) by mouth 2 (two) times daily with a meal. 10/06/19  Yes Kc, Ramesh, MD  hydrocortisone (ANUSOL-HC) 2.5 % rectal cream Apply 1 application topically 4 (four) times daily as needed for hemorrhoids. 09/28/19  Yes Rai, Ripudeep K, MD  ondansetron (ZOFRAN ODT) 4 MG disintegrating tablet Take 1 tablet (4 mg total) by mouth every 8 (eight) hours as needed for nausea or vomiting. 09/28/19  Yes Rai, Ripudeep K, MD  potassium chloride SA (KLOR-CON) 20 MEQ tablet Take 2 tablets (40 mEq total) by mouth daily. 08/30/19  Yes Truitt Merle, MD  amoxicillin-clavulanate (AUGMENTIN) 875-125 MG  tablet Take 1 tablet by mouth 2 (two) times daily. Patient not taking: Reported on 12/04/2019 10/06/19   Antonieta Pert, MD  amoxicillin-clavulanate (AUGMENTIN) 875-125 MG tablet Take 1 tablet by mouth 2 (two) times daily. Patient not taking: Reported on 12/04/2019 10/20/19   Rosiland Oz, MD  diphenoxylate-atropine (LOMOTIL) 2.5-0.025 MG tablet Take 1-2 tablets by mouth 4 (four) times daily as needed for diarrhea or loose stools. Patient not taking: Reported on 11/03/2019 07/14/19   Truitt Merle, MD  guaifenesin (HUMIBID E) 400 MG TABS tablet Take 1 tablet (400 mg total) by mouth every 6 (six) hours as needed. Patient not taking: Reported on 11/03/2019 05/13/19   Geradine Girt, DO  lidocaine-prilocaine (EMLA) cream Apply 1 application topically as needed. Patient not taking: Reported on 11/03/2019 06/23/19   Truitt Merle, MD  loperamide (IMODIUM) 2 MG capsule Take 1-2 capsules (2-4 mg total) by mouth every 6 (six) hours as needed for diarrhea or loose stools. Patient not taking: Reported on 12/04/2019 07/14/19   Truitt Merle, MD  magnesium oxide (MAG-OX) 400 (241.3 Mg) MG tablet Take 1 tablet (400 mg total) by mouth daily. Patient not taking: Reported on 11/03/2019 05/14/19   Geradine Girt, DO  methocarbamol (ROBAXIN) 500 MG tablet Take 1 tablet (500 mg total) by mouth every 6 (six) hours as needed for muscle spasms. Patient not taking: Reported on 12/04/2019 09/28/19   Rai, Vernelle Emerald, MD  pantoprazole (PROTONIX) 40 MG tablet Take 1 tablet (40 mg total) by mouth daily. Patient not taking: Reported on 12/04/2019 09/28/19 01/26/20  Rai, Vernelle Emerald, MD  traMADol (ULTRAM) 50 MG tablet Take 1 tablet (50 mg total) by mouth every 8 (eight) hours as needed for up to 4 doses for moderate pain or severe pain. Patient not taking: Reported on 12/04/2019 10/06/19   Antonieta Pert, MD   No Known Allergies Review of Systems  Constitutional: Positive for activity change, appetite change and fatigue.  Gastrointestinal: Positive for  abdominal distention and abdominal pain.    Physical Exam Vitals and nursing note reviewed.  Constitutional:      Appearance: He is ill-appearing.  Pulmonary:     Effort: Pulmonary effort is normal.  Neurological:     General: No focal deficit present.     Mental Status: He is alert and oriented to person, place, and time.  Psychiatric:        Mood and Affect: Mood normal.        Behavior: Behavior normal.     Vital Signs: BP 137/73 (BP Location: Right Arm)   Pulse 64   Temp 97.6 F (36.4 C)   Resp 14   Ht '5\' 11"'  (1.803 m)   Wt 70.3 kg   SpO2 100%   BMI 21.62 kg/m  Pain Scale: 0-10   Pain Score: 0-No pain   SpO2: SpO2: 100 % O2 Device:SpO2: 100 % O2 Flow Rate: .   IO: Intake/output summary:   Intake/Output Summary (Last 24 hours) at 12/05/2019 1238 Last data filed at 12/05/2019 0934 Gross per 24 hour  Intake 896.1 ml  Output 550 ml  Net 346.1 ml    LBM: Last BM Date: 12/03/19 Baseline Weight: Weight: 70.3 kg Most recent weight: Weight: 70.3 kg     Palliative Assessment/Data: PPS: 50%     Thank you for this consult. Palliative medicine will continue to follow and assist as needed.   Time In: 1136 Time Out: 1252 Time Total: 76 mins Greater than 50%  of this time was spent counseling and coordinating care related to the above assessment and plan.  Signed by: Mariana Kaufman, AGNP-C Palliative Medicine    Please contact Palliative Medicine Team phone at [phone] for questions and concerns.  For individual provider: See Shea Evans

## 2019-12-05 NOTE — Progress Notes (Signed)
PROGRESS NOTE    Isaac Doyle  ZOX:[id number] DOB: 06-01-1955 DOA: 12/04/2019 PCP: Pcp, No    Chief Complaint  Patient presents with  . Abdominal Pain    hx pancreatic cancer    Brief Narrative:  HPI per Dr. Neysa Bonito This is a 64 year old male with a past medical history of metastatic pancreatic cancer (patient of Dr. Burr Medico), hypertension, CBD obstruction with stent, hepatic abscess s/p drain removal on 11/16/2019 who presented to the ED complaining of RUQ abdominal pain x2 days with radiation to the back and chest.  No nausea, vomiting, diarrhea, fever, chills.  ED Course: Afebrile and hemodynamically stable on room air. Notable labs: Sodium 131, alk phos 242, T bili 4.0, albumin 2.1, lipase 17, Hb 8.8 (at baseline).  CT abdomen pelvis with contrast with small splenic infarct and possible DVT of the LLE in the common femoral and deep femoral veins and known pancreatic cancer with metastatic adenopathy and hepatic masses which are slightly larger than prior.  Bilateral lower extremity US: LLE DVT in proximal profunda vein.  He was given morphine, 1 L LR bolus and started on a heparin drip.  Assessment & Plan:   Active Problems:   Pancreatic cancer metastasized to liver ([place])   Anemia of chronic disease   RUQ abdominal pain   Hyponatremia   Splenic infarct   Acute deep vein thrombosis (DVT) of left lower extremity (HCC)  1 abdominal pain: Multifactorial likely secondary to metastatic pancreatic cancer, splenic infarct Patient presenting with abdominal pain, elevated bilirubin, CT abdomen and pelvis concerning for small splenic infarct.  Gastroenterology consulted and patient seen in consultation by Dr. Carlean Purl.  GI reviewed CT scan and feel uncovered biliary stent (placed 09/24/2019) is patent with no role for overlapping stent at this time.  GI does not feel HIDA scan will be helpful at this time given liver metastases.  Patient noted to have cholelithiasis and a distended gallbladder  so cholecystitis is a possibility, also concerns for possible ascending cholangitis with stent per GI.  GI recommending continuation of antibiotics to complete a 10-day course.  Continue IV Zosyn and could likely transition to oral Augmentin on discharge.  Oncology following as well.  2.  Left lower extremity proximal profunda, common femoral, deep femoral vein DVT Admitting physician, Dr. Neysa Bonito discussed case with IR, Dr. Earleen Newport who stated patient was not a candidate for IR intervention at this time and to continue anticoagulation.  Was also noted that IR Dr. Earleen Newport also mentioned that the patient had pulmonary emboli at the bases which was noted on CT abdomen but not mentioned in the radiology report.  Due to no change in treatment management CT chest not ordered.  Continue IV heparin and could likely transition to full dose Lovenox on discharge.  Oncology following.  3.  Elevated total bilirubin with history of CBD obstruction and CBD stent placement Patient for ultrasound-guided paracentesis today.  GI consulted.GI reviewed CT scan and feel uncovered biliary stent (placed 09/24/2019) is patent with no role for overlapping stent at this time.  GI does not feel HIDA scan will be helpful at this time given liver metastases.  Patient noted to have cholelithiasis and a distended gallbladder so cholecystitis is a possibility, also concerns for possible early ascending cholangitis with stent per GI.  GI recommending continuation of antibiotics to complete a 10-day course.  Per GI outpatient follow-up with GI in 3 to 4 weeks, if persistent rising bilirubin could consider MRCP as outpatient.  Continue IV Zosyn  and could likely transition to oral Augmentin on discharge.  GI was following but have signed off as of 12/05/2019.  4.  Metastatic pancreatic cancer to the liver and lymph nodes CT abdomen and pelvis with liver mets appeared to be slightly larger.  Oncology consulted and are following.  Palliative care also  consulted.  5.  Recent hepatic abscess status post drain removal 11/16/2019 Stable.  Afebrile.  Follow.  6.  Cachexia Secondary to metastatic cancer.  Dietary consulted.  7.  Hyponatremia Improving with hydration.  IV fluids.  8.  Anemia of chronic disease .  Follow.   DVT prophylaxis: Full dose heparin Code Status: DNR Family Communication: Updated patient and daughter at bedside. Disposition:   Status is: Inpatient    Dispo: The patient is from: Home              Anticipated d/c is to: Home              Anticipated d/c date is: 1 to 2 days.              Patient currently on IV heparin for acute DVT, on IV Zosyn, patient to undergo ultrasound-guided paracentesis.  Not stable for discharge.       Consultants:   Gastroenterology: Dr. Carlean Purl 12/04/2019  Hematology/oncology: Dr. Jana Hakim 12/04/2019  Palliative care 12/05/2019  Procedures:   CT abdomen and pelvis 12/04/2019  Sound paracentesis 12/05/2019 pending  Lower extremity Dopplers 12/04/2019  Antimicrobials:   IV Zosyn 12/04/2019   Subjective: Laying in bed.  Arousable.  Stated had a bout of sharp chest pain that lasted about 30 to 45 seconds when he went to the bathroom which has since resolved.  Denies any shortness of breath.  He states some improvement with right upper quadrant pain since admission.  Daughter at bedside.  Objective: Vitals:   12/04/19 1932 12/04/19 2322 12/05/19 0334 12/05/19 0857  BP: 134/80 125/79 120/71 137/73  Pulse: 65 65 66 64  Resp: _0 Temp: 98.2 F (36.8 C) 97.6 F (36.4 C) 97.6 F (36.4 C)   TempSrc: Oral     SpO2: 100% 100% 95% 100%  Weight:      Height:        Intake/Output Summary (Last 24 hours) at 12/05/2019 1105 Last data filed at 12/05/2019 0934 Gross per 24 hour  Intake 896.1 ml  Output 550 ml  Net 346.1 ml   Filed Weights   12/04/19 0456  Weight: 70.3 kg    Examination:  General exam: Appears calm and comfortable.  Frail.   Cachectic.  Some temporal wasting. Respiratory system: Clear to auscultation. Respiratory effort normal. Cardiovascular system: S1 & S2 heard, RRR. No JVD, murmurs, rubs, gallops or clicks. No pedal edema. Gastrointestinal system: Abdomen is nondistended, soft and some tenderness to palpation right upper quadrant.  Positive bowel sounds.  No rebound.  No guarding.  Central nervous system: Alert and oriented. No focal neurological deficits. Extremities: Symmetric 5 x 5 power. Skin: No rashes, lesions or ulcers Psychiatry: Judgement and insight appear normal. Mood & affect appropriate.     Data Reviewed: I have personally reviewed following labs and imaging studies  CBC: Recent Labs  Lab 12/04/19 0619 12/05/19 0338  WBC 7.1 5.2  NEUTROABS 5.6  --   HGB 8.8* 8.6*  HCT 27.5* 27.0*  MCV 83.6 85.2  PLT 120* 114*    Basic Metabolic Panel: Recent Labs  Lab 12/04/19 0619 12/05/19 0338  NA 131* 133*  K 3.6 4.8  CL 101 102  CO2 22 24  GLUCOSE 126* 113*  BUN 8 9  CREATININE 0.71 0.86  CALCIUM 8.0* 8.0*    GFR: Estimated Creatinine Clearance: 85.2 mL/min (by C-G formula based on SCr of 0.86 mg/dL).  Liver Function Tests: Recent Labs  Lab 12/04/19 0619 12/05/19 0338  AST 36 19  ALT 16 12  ALKPHOS 242* 187*  BILITOT 4.0* 2.5*  PROT 5.8* 5.3*  ALBUMIN 2.1* 2.0*    CBG: No results for input(s): GLUCAP in the last 168 hours.   No results found for this or any previous visit (from the past 240 hour(s)).       Radiology Studies: CT ABDOMEN PELVIS W CONTRAST  Result Date: 12/04/2019 CLINICAL DATA:  Right upper quadrant abdominal pain. History of pancreatic carcinoma. EXAM: CT ABDOMEN AND PELVIS WITH CONTRAST TECHNIQUE: Multidetector CT imaging of the abdomen and pelvis was performed using the standard protocol following bolus administration of intravenous contrast. CONTRAST:  137m OMNIPAQUE IOHEXOL 300 MG/ML  SOLN COMPARISON:  11/16/2019 and older CTs. FINDINGS:  Lower chest: Small left pleural effusion. Mild dependent atelectasis in the posterior left lower lobe. Small area of opacity in the peripheral right lower lobe consistent with atelectasis or scarring, similar to the prior study. Hepatobiliary: Liver normal in size. Multiple small hypoattenuating liver masses consistent with metastatic disease. These all appear subcentimeter in size. These appear increased compared to the prior CT. Reference measurement of a lesion at the posterior aspect of segment 7 currently measures 8 mm, 3-4 mm on the prior CT. There is intrahepatic biliary air that is unchanged. Gallbladder is distended with dependent stones and non dependent air, similar to the prior CT. A biliary stent extends from the peripheral porta hepatis to the ampulla of Vater, unchanged. Pancreas: Pancreatic mass is not well-defined. Most of the pancreas shows depressed enhancement. There are peripancreatic lymph nodes, largest 1.4 cm in short axis, consistent with metastatic disease. These findings are stable from the prior CT. Spleen: Peripheral, wedge-shaped hypoattenuating area is mid to upper spleen measuring 16 x 12 mm, in not evident on the prior CT, likely a small infarct. Spleen normal in size with no other lesions. Adrenals/Urinary Tract: No adrenal masses. Kidneys normal in overall size, orientation and position with symmetric enhancement and excretion. Low-density renal masses consistent with cysts, larger the left, are stable. No hydronephrosis. Normal ureters. Normal bladder. Stomach/Bowel: Stomach is unremarkable. Small bowel and colon are normal in caliber. No wall thickening. No evidence of inflammation. Multiple sigmoid diverticula without diverticulitis. Vascular/Lymphatic: Possible deep venous thrombosis at the level of the left common femoral vein also suggested in the left deep femoral vein. This could be from incomplete opacification and not thrombus. Mild aortic atherosclerosis.  No aneurysm.  Portal vein is compressed by the pancreatic mass, with the confluence of the portal vein, splenic vein and superior mesenteric vein mostly effaced. This is similar to the prior CT. Porta hepatis adenopathy, stable from the prior CT. No other enlarged lymph nodes. Reproductive: Unremarkable. Other: Small to moderate amount of ascites, unchanged from the prior CT. Musculoskeletal: Chronic mild compression fracture of T12. No acute fractures. No osteoblastic or osteolytic lesions. IMPRESSION: 1. No definite acute abnormality within the abdomen or pelvis. 2. Gallbladder is distended with dependent stones and non dependent air, but no definite wall thickening. Early acute cholecystitis felt unlikely given the stable appearance from the prior CT, although not excluded. 3. Findings of pancreatic carcinoma and metastatic disease including peripancreatic  metastatic adenopathy, multiple small liver masses and ascites. Small liver masses appear increased when compared to the prior CT consistent with metastatic disease progression. 4. New small wedge-shaped area of hypoattenuation in the spleen. Suspect a small infarct. 5. Possible deep venous thrombosis of the left lower extremity in the common femoral and deep femoral veins. Consider follow-up left lower extremity venous duplex exam if there are clinical findings supporting deep venous thrombosis. Electronically Signed   By: Lajean Manes M.D.   On: 12/04/2019 07:29   VAS [place] LOWER EXTREMITY VENOUS (DVT) (ONLY [facility] & [facility])  Result Date: 12/04/2019  Lower Venous DVTStudy Indications: F/U CT findings.  Risk Factors: Cancer malignant neoplasm of pancreas. Comparison Study: No prior studies. Performing Technologist: Darlin Coco, RDMS  Examination Guidelines: A complete evaluation includes B-mode imaging, spectral Doppler, color Doppler, and power Doppler as needed of all accessible portions of each vessel. Bilateral testing is considered an integral part of a complete  examination. Limited examinations for reoccurring indications may be performed as noted. The reflux portion of the exam is performed with the patient in reverse Trendelenburg.  +-----+---------------+---------+-----------+----------+--------------+ RIGHTCompressibilityPhasicitySpontaneityPropertiesThrombus Aging +-----+---------------+---------+-----------+----------+--------------+ CFV  Full           Yes      Yes                                 +-----+---------------+---------+-----------+----------+--------------+   +---------+---------------+---------+-----------+----------+--------------+ LEFT     CompressibilityPhasicitySpontaneityPropertiesThrombus Aging +---------+---------------+---------+-----------+----------+--------------+ CFV      Full           Yes      Yes                                 +---------+---------------+---------+-----------+----------+--------------+ SFJ      Full                                                        +---------+---------------+---------+-----------+----------+--------------+ FV Prox  Full                                                        +---------+---------------+---------+-----------+----------+--------------+ FV Mid   Full                                                        +---------+---------------+---------+-----------+----------+--------------+ FV DistalFull                                                        +---------+---------------+---------+-----------+----------+--------------+ PFV      Partial        Yes      Yes                  Acute          +---------+---------------+---------+-----------+----------+--------------+  POP      Full           Yes      Yes                                 +---------+---------------+---------+-----------+----------+--------------+ PTV      Full                                                         +---------+---------------+---------+-----------+----------+--------------+ PERO     Full                                                        +---------+---------------+---------+-----------+----------+--------------+     Summary: RIGHT: - No evidence of common femoral vein obstruction.  LEFT: - Findings consistent with acute deep vein thrombosis involving the left proximal profunda vein. - No cystic structure found in the popliteal fossa.  *See table(s) above for measurements and observations. Electronically signed by Deitra Mayo MD on 12/04/2019 at 2:08:26 PM.    Final         Scheduled Meds: . atenolol  100 mg Oral Daily  . Chlorhexidine Gluconate Cloth  6 each Topical Daily  . feeding supplement  237 mL Oral BID BM  . ferrous sulfate  325 mg Oral BID WC  . pantoprazole  40 mg Oral Q0600  . potassium chloride SA  40 mEq Oral Daily   Continuous Infusions: . heparin 1,300 Units/hr (12/05/19 0538)  . piperacillin-tazobactam (ZOSYN)  IV 3.375 g (12/05/19 0810)     LOS: 1 day    Time spent: 35 minutes    Irine Seal, MD [facility]   To contact the attending provider between 7A-7P or the covering provider during after hours 7P-7A, please log into the web site [email or web address] and access using universal [place] password for that web site. If you do not have the password, please call the hospital operator.  12/05/2019, 11:05 AM

## 2019-12-06 DIAGNOSIS — E43 Unspecified severe protein-calorie malnutrition: Secondary | ICD-10-CM | POA: Insufficient documentation

## 2019-12-06 LAB — MAGNESIUM: Magnesium: 1.3 mg/dL — ABNORMAL LOW (ref 1.7–2.4)

## 2019-12-06 LAB — COMPREHENSIVE METABOLIC PANEL
ALT: 11 U/L (ref 0–44)
AST: 12 U/L — ABNORMAL LOW (ref 15–41)
Albumin: 1.9 g/dL — ABNORMAL LOW (ref 3.5–5.0)
Alkaline Phosphatase: 168 U/L — ABNORMAL HIGH (ref 38–126)
Anion gap: 7 (ref 5–15)
BUN: 10 mg/dL (ref 8–23)
CO2: 22 mmol/L (ref 22–32)
Calcium: 7.7 mg/dL — ABNORMAL LOW (ref 8.9–10.3)
Chloride: 104 mmol/L (ref 98–111)
Creatinine, Ser: 0.79 mg/dL (ref 0.61–1.24)
GFR, Estimated: >60 mL/min (ref >60)
Glucose, Bld: 119 mg/dL — ABNORMAL HIGH (ref 70–99)
Potassium: 4.3 mmol/L (ref 3.5–5.1)
Sodium: 133 mmol/L — ABNORMAL LOW (ref 135–145)
Total Bilirubin: 1.6 mg/dL — ABNORMAL HIGH (ref 0.3–1.2)
Total Protein: 5.1 g/dL — ABNORMAL LOW (ref 6.5–8.1)

## 2019-12-06 LAB — CBC
HCT: 26.2 % — ABNORMAL LOW (ref 39.0–52.0)
Hemoglobin: 8.3 g/dL — ABNORMAL LOW (ref 13.0–17.0)
MCH: 26.8 pg (ref 26.0–34.0)
MCHC: 31.7 g/dL (ref 30.0–36.0)
MCV: 84.5 fL (ref 80.0–100.0)
Platelets: 147 10*3/uL — ABNORMAL LOW (ref 150–400)
RBC: 3.1 MIL/uL — ABNORMAL LOW (ref 4.22–5.81)
RDW: 15.9 % — ABNORMAL HIGH (ref 11.5–15.5)
WBC: 6.2 10*3/uL (ref 4.0–10.5)
nRBC: 0 % (ref 0.0–0.2)

## 2019-12-06 LAB — HEPARIN LEVEL (UNFRACTIONATED)
Heparin Unfractionated: 0.25 IU/mL — ABNORMAL LOW (ref 0.30–0.70)
Heparin Unfractionated: 0.47 IU/mL (ref 0.30–0.70)

## 2019-12-06 MED ORDER — MAGNESIUM SULFATE 4 GM/100ML IV SOLN
4.0000 g | Freq: Once | INTRAVENOUS | Status: AC
  Administered 2019-12-06: 4 g via INTRAVENOUS
  Filled 2019-12-06: qty 100

## 2019-12-06 MED ORDER — HYDROMORPHONE HCL 1 MG/ML IJ SOLN
1.0000 mg | INTRAMUSCULAR | Status: DC | PRN
  Administered 2019-12-06 – 2019-12-08 (×3): 1 mg via INTRAVENOUS
  Filled 2019-12-06 (×3): qty 1

## 2019-12-06 MED ORDER — HYDROCOD POLST-CPM POLST ER 10-8 MG/5ML PO SUER
5.0000 mL | Freq: Two times a day (BID) | ORAL | Status: DC
  Administered 2019-12-06 – 2019-12-08 (×5): 5 mL via ORAL
  Filled 2019-12-06 (×5): qty 5

## 2019-12-06 MED ORDER — HYDROMORPHONE HCL 1 MG/ML IJ SOLN
1.0000 mg | INTRAMUSCULAR | Status: AC
  Administered 2019-12-06: 1 mg via INTRAVENOUS
  Filled 2019-12-06: qty 1

## 2019-12-06 NOTE — Progress Notes (Signed)
ANTICOAGULATION CONSULT NOTE Pharmacy Consult for IV heparin Indication: DVT  Patient Measurements: Height: 5\' 11"  (180.3 cm) Weight: 70.3 kg (155 lb) IBW/kg (Calculated) : 75.3 Heparin Dosing Weight: 70.3 kg  Labs: Recent Labs    12/04/19 0619 12/04/19 1652 12/05/19 0338 12/05/19 2208 12/06/19 0325  HGB 8.8*  --  8.6*  --  8.3*  HCT 27.5*  --  27.0*  --  26.2*  PLT 120*  --  114*  --  147*  HEPARINUNFRC  --    < > 0.47 0.21* 0.25*  CREATININE 0.71  --  0.86  --  0.79   < > = values in this interval not displayed.     Assessment: 64 yo male with hx pancreatic cancer and was supposed to have paracentesis tomorrow for further evaluation of cancer presents to ED with abdominal pain. Per Dopplers, has new DVT to start IV heparin per Rx dosing.  First heparin level 0.53, therapeutic on heparin at 1300 units/hr No bleeding or complications reported.  12/06/2019  Heparin level  = 0.25 (sub-therapeutic) with heparin gtt @ 1450 units/hr Hgb down to 8.3 No complications of therapy noted No bleeding reported  Goal of Therapy:  Heparin level 0.3-0.7 units/ml Monitor platelets by anticoagulation protocol: Yes   Plan:   increase  Heparin drip to 1550 units/hr  Heparin level in 6 hours  Daily heparin level & CBC  After all invasive procedures completed, consider transition to LMWH 1.5 mg/kg q24 = LMWH 100 mg q24 for long term rx in cancer pt  Dolly Rias RPh 12/06/2019, 4:32 AM

## 2019-12-06 NOTE — Progress Notes (Signed)
PROGRESS NOTE    Isaac Doyle  POE:[id number] DOB: [birth year] DOA: 12/04/2019 PCP: Pcp, No    Chief Complaint  Patient presents with  . Abdominal Pain    hx pancreatic cancer    Brief Narrative:  HPI per Dr. Neysa Bonito This is a 64 year old male with a past medical history of metastatic pancreatic cancer (patient of Dr. Burr Medico), hypertension, CBD obstruction with stent, hepatic abscess s/p drain removal on 11/16/2019 who presented to the ED complaining of RUQ abdominal pain x2 days with radiation to the back and chest.  No nausea, vomiting, diarrhea, fever, chills.  ED Course: Afebrile and hemodynamically stable on room air. Notable labs: Sodium 131, alk phos 242, T bili 4.0, albumin 2.1, lipase 17, Hb 8.8 (at baseline).  CT abdomen pelvis with contrast with small splenic infarct and possible DVT of the LLE in the common femoral and deep femoral veins and known pancreatic cancer with metastatic adenopathy and hepatic masses which are slightly larger than prior.  Bilateral lower extremity US: LLE DVT in proximal profunda vein.  He was given morphine, 1 L LR bolus and started on a heparin drip.  Assessment & Plan:   Active Problems:   Pancreatic cancer metastasized to liver ([place])   Anemia of chronic disease   RUQ abdominal pain   Ascites   Hyponatremia   Splenic infarct   Acute deep vein thrombosis (DVT) of left lower extremity ([place])   Advanced care planning/counseling discussion   Goals of care, counseling/discussion   Malignant neoplasm of head of pancreas (HCC)   Protein-calorie malnutrition, severe  1 abdominal pain: Multifactorial likely secondary to metastatic pancreatic cancer, splenic infarct Patient presenting with abdominal pain, elevated bilirubin, CT abdomen and pelvis concerning for small splenic infarct.  Gastroenterology consulted and patient seen in consultation by Dr. Carlean Purl.  GI reviewed CT scan and feel uncovered biliary stent (placed 09/24/2019) is patent with no  role for overlapping stent at this time.  GI does not feel HIDA scan will be helpful at this time given liver metastases.  Patient noted to have cholelithiasis and a distended gallbladder so cholecystitis is a possibility, also concerns for possible ascending cholangitis with stent per GI.  GI recommending continuation of antibiotics to complete a 10-day course.  Continue IV Zosyn and could likely transition to oral Augmentin on discharge.  Oncology following as well.  2.  Left lower extremity proximal profunda, common femoral, deep femoral vein DVT Admitting physician, Dr. Neysa Bonito discussed case with IR, Dr. Earleen Newport who stated patient was not a candidate for IR intervention at this time and to continue anticoagulation.  Was also noted that IR Dr. Earleen Newport also mentioned that the patient had pulmonary emboli at the bases which was noted on CT abdomen but not mentioned in the radiology report.  Due to no change in treatment management CT chest not ordered.  Continue IV heparin and could likely transition to full dose Lovenox or DOAC on discharge.  Oncology following.  3.  Elevated total bilirubin with history of CBD obstruction and CBD stent placement Patient for ultrasound-guided paracentesis today.  GI consulted.GI reviewed CT scan and feel uncovered biliary stent (placed 09/24/2019) is patent with no role for overlapping stent at this time.  GI does not feel HIDA scan will be helpful at this time given liver metastases.  Patient noted to have cholelithiasis and a distended gallbladder so cholecystitis is a possibility, also concerns for possible early ascending cholangitis with stent per GI.  GI recommending continuation of  antibiotics to complete a 10-day course.  Per GI outpatient follow-up with GI in 3 to 4 weeks, if persistent rising bilirubin could consider MRCP as outpatient.  Continue IV Zosyn and transition to oral antibiotics hopefully tomorrow.  GI was following but have signed off as of 12/05/2019.   Follow.   4.  Metastatic pancreatic cancer to the liver and lymph nodes CT abdomen and pelvis with liver mets appeared to be slightly larger.  Oncology consulted and are following.  Palliative care also consulted and are following..  5.  Recent hepatic abscess status post drain removal 11/16/2019 Stable.  Afebrile.  Follow.  6.  Cachexia Secondary to metastatic cancer.  Dietary consulted.  7.  Hyponatremia/hypomagnesemia Improved with hydration.  Saline lock IV fluids.  Follow.  Magnesium at 1.3.  Magnesium sulfate 4 g IV x1.   8.  Anemia of chronic disease .  Follow.   DVT prophylaxis: Full dose heparin Code Status: DNR Family Communication: Updated patient and daughter at bedside. Disposition:   Status is: Inpatient    Dispo: The patient is from: Home              Anticipated d/c is to: Home              Anticipated d/c date is: 1 to 2 days.              Patient currently on IV heparin for acute DVT, on IV Zosyn, patient to undergo ultrasound-guided paracentesis.  Not stable for discharge.       Consultants:   Gastroenterology: Dr. Carlean Purl 12/04/2019  Hematology/oncology: Dr. Jana Hakim 12/04/2019  Palliative care 12/05/2019  Procedures:   CT abdomen and pelvis 12/04/2019  Sound paracentesis 12/05/2019 pending  Lower extremity Dopplers 12/04/2019  Ultrasound-guided diagnostic and therapeutic paracentesis with 1.5 L of yellow fluid removed per IR, Darrell Allred, PA 12/05/2019  Antimicrobials:   IV Zosyn 12/04/2019   Subjective: Patient laying in bed.  States had a little bit of sharp chest pain which was relieved on pain medication.  Denies any shortness of breath.  States some improvement with the upper abdominal pain.   Objective: Vitals:   12/05/19 1505 12/05/19 1526 12/05/19 2120 12/06/19 0542  BP: (!) 148/74 (!) 143/73 131/74 (!) 150/75  Pulse:   (!) 58 (!) 58  Resp:   18 16  Temp:   98.6 F (37 C) 98 F (36.7 C)  TempSrc:   Oral Oral  SpO2:    100% 100%  Weight:      Height:        Intake/Output Summary (Last 24 hours) at 12/06/2019 1307 Last data filed at 12/06/2019 0600 Gross per 24 hour  Intake 1640.7 ml  Output 525 ml  Net 1115.7 ml   Filed Weights   12/04/19 0456  Weight: 70.3 kg    Examination:  General exam: Frail, cachectic, temporal wasting. Respiratory system: CTA B.  No wheezes, no crackles, no rhonchi.  Normal respiratory effort.   Cardiovascular system: Regular rate rhythm no murmurs rubs or gallops.  No JVD.  No lower extremity edema.  Gastrointestinal system: Abdomen is soft, decreased tenderness to palpation in the right upper quadrant, positive bowel sounds, no rebound, no guarding.  Central nervous system: Alert and oriented. No focal neurological deficits. Extremities: Symmetric 5 x 5 power. Skin: No rashes, lesions or ulcers Psychiatry: Judgement and insight appear normal. Mood & affect appropriate.     Data Reviewed: I have personally reviewed following labs and imaging studies  CBC: Recent Labs  Lab 12/04/19 0619 12/05/19 0338 12/06/19 0325  WBC 7.1 5.2 6.2  NEUTROABS 5.6  --   --   HGB 8.8* 8.6* 8.3*  HCT 27.5* 27.0* 26.2*  MCV 83.6 85.2 84.5  PLT 120* 114* 147*    Basic Metabolic Panel: Recent Labs  Lab 12/04/19 0619 12/05/19 0338 12/06/19 0325  NA 131* 133* 133*  K 3.6 4.8 4.3  CL 101 102 104  CO2 _0 GLUCOSE 126* 113* 119*  BUN _1 CREATININE 0.71 0.86 0.79  CALCIUM 8.0* 8.0* 7.7*  MG  --   --  1.3*    GFR: Estimated Creatinine Clearance: 91.5 mL/min (by C-G formula based on SCr of 0.79 mg/dL).  Liver Function Tests: Recent Labs  Lab 12/04/19 0619 12/05/19 0338 12/06/19 0325  AST 36 19 12*  ALT _2 ALKPHOS 242* 187* 168*  BILITOT 4.0* 2.5* 1.6*  PROT 5.8* 5.3* 5.1*  ALBUMIN 2.1* 2.0* 1.9*    CBG: No results for input(s): GLUCAP in the last 168 hours.   Recent Results (from the past 240 hour(s))  Respiratory Panel by RT PCR (Flu A&B,  Covid) - Nasopharyngeal Swab     Status: None   Collection Time: 12/05/19  8:35 AM   Specimen: Nasopharyngeal Swab  Result Value Ref Range Status   SARS Coronavirus 2 by RT PCR NEGATIVE NEGATIVE Final    Comment: (NOTE) SARS-CoV-2 target nucleic acids are NOT DETECTED.  The SARS-CoV-2 RNA is generally detectable in upper respiratoy specimens during the acute phase of infection. The lowest concentration of SARS-CoV-2 viral copies this assay can detect is 131 copies/mL. A negative result does not preclude SARS-Cov-2 infection and should not be used as the sole basis for treatment or other patient management decisions. A negative result may occur with  improper specimen collection/handling, submission of specimen other than nasopharyngeal swab, presence of viral mutation(s) within the areas targeted by this assay, and inadequate number of viral copies (<131 copies/mL). A negative result must be combined with clinical observations, patient history, and epidemiological information. The expected result is Negative.  Fact Sheet for Patients:  PinkCheek.be  Fact Sheet for Healthcare Providers:  GravelBags.it  This test is no t yet approved or cleared by the [place] FDA and  has been authorized for detection and/or diagnosis of SARS-CoV-2 by FDA under an Emergency Use Authorization (EUA). This EUA will remain  in effect (meaning this test can be used) for the duration of the COVID-19 declaration under Section 564(b)(1) of the Act, 21 U.S.C. section 360bbb-3(b)(1), unless the authorization is terminated or revoked sooner.     Influenza A by PCR NEGATIVE NEGATIVE Final   Influenza B by PCR NEGATIVE NEGATIVE Final    Comment: (NOTE) The Xpert Xpress SARS-CoV-2/FLU/RSV assay is intended as an aid in  the diagnosis of influenza from Nasopharyngeal swab specimens and  should not be used as a sole basis for treatment. Nasal  washings and  aspirates are unacceptable for Xpert Xpress SARS-CoV-2/FLU/RSV  testing.  Fact Sheet for Patients: PinkCheek.be  Fact Sheet for Healthcare Providers: GravelBags.it  This test is not yet approved or cleared by the [place] FDA and  has been authorized for detection and/or diagnosis of SARS-CoV-2 by  FDA under an Emergency Use Authorization (EUA). This EUA will remain  in effect (meaning this test can be used) for the duration of the  Covid-19 declaration under Section 564(b)(1) of the Act, 21  U.S.C. section 360bbb-3(b)(1), unless the authorization is  terminated or revoked. Performed at [facility], [place] [address], [place], [place] [address]          Radiology Studies: US Paracentesis  Result Date: 12/05/2019 INDICATION: Patient with history of metastatic pancreatic cancer, left lower extremity DVT, PE, ascites. Request received for diagnostic and therapeutic paracentesis. EXAM: ULTRASOUND GUIDED DIAGNOSTIC AND THERAPEUTIC PARACENTESIS MEDICATIONS: 1% lidocaine to skin and subcutaneous tissue COMPLICATIONS: None immediate. PROCEDURE: Informed written consent was obtained from the patient after a discussion of the risks, benefits and alternatives to treatment. A timeout was performed prior to the initiation of the procedure. Initial ultrasound scanning demonstrates a small amount of ascites within the left mid to lower abdominal quadrant. The left mid to lower abdomen was prepped and draped in the usual sterile fashion. 1% lidocaine was used for local anesthesia. Following this, a 19 gauge, 10-cm, Yueh catheter was introduced. An ultrasound image was saved for documentation purposes. The paracentesis was performed. The catheter was removed and a dressing was applied. The patient tolerated the procedure well without immediate post procedural complication. FINDINGS: A total of approximately 1.5  liters of yellow fluid was removed. Samples were sent to the laboratory as requested by the clinical team. IMPRESSION: Successful ultrasound-guided diagnostic and therapeutic paracentesis yielding 1.5 liters of peritoneal fluid. Read by: Rowe Jalyn, PA-C Electronically Signed   By: Aletta Edouard M.D.   On: 12/05/2019 15:42        Scheduled Meds: . (feeding supplement) PROSource Plus  30 mL Oral BID BM  . atenolol  100 mg Oral Daily  . Chlorhexidine Gluconate Cloth  6 each Topical Daily  . chlorpheniramine-HYDROcodone  5 mL Oral Q12H  . feeding supplement  1 Container Oral TID BM  . ferrous sulfate  325 mg Oral BID WC  . multivitamin with minerals  1 tablet Oral Daily  . pantoprazole  40 mg Oral Q0600  . potassium chloride SA  40 mEq Oral Daily  . senna-docusate  1 tablet Oral BID   Continuous Infusions: . heparin 1,550 Units/hr (12/06/19 0453)  . piperacillin-tazobactam (ZOSYN)  IV 3.375 g (12/06/19 0818)     LOS: 2 days    Time spent: 35 minutes    Irine Seal, MD [facility]   To contact the attending provider between 7A-7P or the covering provider during after hours 7P-7A, please log into the web site [email or web address] and access using universal [place] password for that web site. If you do not have the password, please call the hospital operator.  12/06/2019, 1:07 PM

## 2019-12-06 NOTE — Progress Notes (Signed)
Hydrologist [facility]) Hospital Liaison: RN note    Notified by Transition of Care Manger of patient/family request for [facility] services at home after discharge. Chart and patient information under review by [facility] physician. Hospice eligibility pending currently.    Writer spoke with [name] to initiate education related to hospice philosophy, services and team approach to care. Daisha verbalized understanding of information given. Per discussion, plan is for discharge to home by PTAR.     Please send signed and completed DNR form home with patient/family. Patient will need prescriptions for discharge comfort medications.     DME needs have been discussed, patient currently has the following equipment in the home: Cane, bedside commode, shower chair.  Patient/family requests the following DME for delivery to the home: None.  [facility] Referral Center aware of the above. Please notify [facility] when patient is ready to leave the unit at discharge. (Call [phone] or [phone] after 5pm.) [facility] information and contact numbers given to [facility].     Please call with any hospice related questions.     Thank you for this referral.    Clementeen Hoof, BSN, [facility] (listed on AMION under Hospice and [place] of [place])  [phone]

## 2019-12-06 NOTE — TOC Initial Note (Signed)
Transition of Care [facility]) - Initial/Assessment Note    Patient Details  Name: Isaac Doyle MRN: [id number] Date of Birth: October 10, 1955  Transition of Care [facility]) CM/SW Contact:    Lennart Pall, LCSW Phone Number: 12/06/2019, 12:58 PM  Clinical Narrative:                 Referral received from Palliative Care to assist with referral to [place] for Hospice services in the home.  Spoke with pt and his daughter, Isaac Doyle, to confirm wishes.  Daughter will have patient return to her home and she and her children will continue to share in providing close to 24/7 support.  Pt and daughter would like to have some time with pt at home and will consider transition to residential Hospice as needed.  Placed call/ VM left for Trixie Dredge, hospital liaison for [facility].  TOC will continue to follow.  Expected Discharge Plan: Home w Hospice Care Barriers to Discharge: Continued Medical Work up   Patient Goals and CMS Choice Patient states their goals for this hospitalization and ongoing recovery are:: go home      Expected Discharge Plan and Services Expected Discharge Plan: Home w Hospice Care In-house Referral: Clinical Social Work   Post Acute Care Choice: Hospice Living arrangements for the past 2 months: Isaac Doyle                                      Prior Living Arrangements/Services Living arrangements for the past 2 months: Single Family Home Lives with:: Adult Children Patient language and need for interpreter reviewed:: Yes Do you feel safe going back to the place where you live?: Yes      Need for Family Participation in Patient Care: Yes (Comment) Care giver support system in place?: Yes (comment)   Criminal Activity/Legal Involvement Pertinent to Current Situation/Hospitalization: No - Comment as needed  Activities of Daily Living Home Assistive Devices/Equipment: Cane (specify quad or straight), Bedside commode/3-in-1, Shower chair with back ADL  Screening (condition at time of admission) Patient's cognitive ability adequate to safely complete daily activities?: Yes Is the patient deaf or have difficulty hearing?: No Does the patient have difficulty seeing, even when wearing glasses/contacts?: No Does the patient have difficulty concentrating, remembering, or making decisions?: No Patient able to express need for assistance with ADLs?: Yes Does the patient have difficulty dressing or bathing?: No Independently performs ADLs?: Yes (appropriate for developmental age) Does the patient have difficulty walking or climbing stairs?: No Weakness of Legs: Both Weakness of Arms/Hands: Both  Permission Sought/Granted Permission sought to share information with : Family Supports Permission granted to share information with : Yes, Verbal Permission Granted  Share Information with NAME: Milagros Evener     Permission granted to share info w Relationship: daughter  Permission granted to share info w Contact Information: [phone]  Emotional Assessment Appearance:: Appears stated age Attitude/Demeanor/Rapport: Gracious Affect (typically observed): Accepting, Pleasant Orientation: : Oriented to Self, Oriented to Place, Oriented to  Time, Oriented to Situation Alcohol / Substance Use: Not Applicable Psych Involvement: No (comment)  Admission diagnosis:  Malignant neoplasm of head of pancreas (HCC) [C25.0] Splenic infarct [D73.5] Abdominal pain [R10.9] Acute deep vein thrombosis (DVT) of proximal vein of left lower extremity (HCC) [I82.4Y2] Abdominal pain, unspecified abdominal location [R10.9] Patient Active Problem List   Diagnosis Date Noted  . Protein-calorie malnutrition, severe 12/06/2019  . Advanced care  planning/counseling discussion   . Goals of care, counseling/discussion   . Malignant neoplasm of head of pancreas ([name])   . Hyponatremia 12/04/2019  . Splenic infarct 12/04/2019  . Acute deep vein thrombosis (DVT) of left lower  extremity ([name]) 12/04/2019  . Ascites   . Abdominal pain 09/30/2019  . Elevated LFTs   . RUQ abdominal pain   . Severe sepsis with acute organ dysfunction ([name]) 09/23/2019  . 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 Anemia of chronic disease 09/23/2019  . Cholangitis 09/23/2019  . Acute cholecystitis   . Port-A-Cath in place 08/17/2019  . Symptomatic anemia 08/08/2019  . 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 Pancreatic cancer metastasized to liver ([name])   . ARF (acute renal failure) ([name]) 05/10/2019  . Essential hypertension 05/10/2019  . Hypokalemia 05/10/2019  . Normochromic normocytic anemia 05/10/2019  . Malignant biliary obstruction ([name])   . Jaundice 05/09/2019   PCP:  Pcp, No Pharmacy:   [facility] [place], [place] [address] Phone: [phone] Fax: [phone]     Social Determinants of Health (SDOH) Interventions    Readmission Risk Interventions Readmission Risk Prevention Plan 12/06/2019  Transportation Screening Complete  Medication Review Press photographer) Complete  PCP or Specialist appointment within 3-5 days of discharge Complete  HRI or [place] Complete  SW Recovery Care/Counseling Consult Complete  Palliative Care Screening Complete  [place] Not Applicable

## 2019-12-06 NOTE — Progress Notes (Signed)
Physical Therapy Treatment Patient Details Name: Isaac Doyle MRN: [id number] DOB: 1955/06/07 Today's Date: 12/06/2019    History of Present Illness 64 year old male with medical history of metastatic pancreatic cancer (patient of Dr. 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 He presented to the ED with RUQ abdominal pain x2 days. Pain radiated to back and chest. No N/V/D or fever or chills.    PT Comments    Patient making steady progress with acute PT and increased ambulation distance today. He continues to require min assist for balance throughout. Initiated functional LE exercises this session to maintain/progress strength. He will continue to benefit from skilled PT interventions to progress mobility and independence.    Follow Up Recommendations  Home health PT [facility] PT/OT and Aides to continue)     Equipment Recommendations  None recommended by PT    Recommendations for Other Services       Precautions / Restrictions Precautions Precautions: Fall Restrictions Weight Bearing Restrictions: No    Mobility  Bed Mobility Overal bed mobility: Needs Assistance Bed Mobility: Supine to Sit     Supine to sit: Min guard;Supervision;HOB elevated     General bed mobility comments: no assist requires, pt taking extra time and using bed rail.  Transfers Overall transfer level: Needs assistance Equipment used: Rolling walker (2 wheeled) Transfers: Sit to/from Stand Sit to Stand: Min guard         General transfer comment: close guarding for safety, pt with recall of safe hand placement on RW for power up.  Ambulation/Gait Ambulation/Gait assistance: Min assist Gait Distance (Feet): 140 Feet Assistive device: Rolling walker (2 wheeled) Gait Pattern/deviations: Step-through pattern;Decreased stride length;Trunk flexed Gait velocity: decr   General Gait Details: Intermittent VC's for safe proximity to RW during  gait. min assist to steady pt during gait and Rt knee buckling slightly at the end of gait distance today.    Stairs             Wheelchair Mobility    Modified Rankin (Stroke Patients Only)       Balance Overall balance assessment: Needs assistance Sitting-balance support: Feet supported Sitting balance-Leahy Scale: Good     Standing balance support: Bilateral upper extremity supported;During functional activity Standing balance-Leahy Scale: Fair                              Cognition Arousal/Alertness: Awake/alert Behavior During Therapy: WFL for tasks assessed/performed Overall Cognitive Status: Within Functional Limits for tasks assessed                                        Exercises General Exercises - Lower Extremity Hip Flexion/Marching: AROM;Both;10 reps;Standing Heel Raises: AROM;Both;20 reps;Standing Other Exercises Other Exercises: Hip Extension: AROM;Both;5 reps;Standing Other Exercises: Knee Flexion: AROM;Both;5 reps;Standing    General Comments        Pertinent Vitals/Pain Pain Assessment: No/denies pain    Home Living                      Prior Function            PT Goals (current goals can now be found in the care plan section) Acute Rehab PT Goals Patient Stated Goal: return home with daughter PT Goal Formulation: With patient Time For Goal Achievement:  12/19/19 Potential to Achieve Goals: Good Progress towards PT goals: Progressing toward goals    Frequency    Min 3X/week      PT Plan Current plan remains appropriate    Co-evaluation              AM-PAC PT "6 Clicks" Mobility   Outcome Measure  Help needed turning from your back to your side while in a flat bed without using bedrails?: None Help needed moving from lying on your back to sitting on the side of a flat bed without using bedrails?: None Help needed moving to and from a bed to a chair (including a wheelchair)?: A  Little Help needed standing up from a chair using your arms (e.g., wheelchair or bedside chair)?: A Little Help needed to walk in hospital room?: A Little Help needed climbing 3-5 steps with a railing? : A Little 6 Click Score: 20    End of Session Equipment Utilized During Treatment: Gait belt Activity Tolerance: Patient tolerated treatment well Patient left: in chair;with call bell/phone within reach;with chair alarm set   PT Visit Diagnosis: Muscle weakness (generalized) (M62.81);Unsteadiness on feet (R26.81);Difficulty in walking, not elsewhere classified (R26.2)     Time: 1250-1310 PT Time Calculation (min) (ACUTE ONLY): 20 min  Charges:  $Therapeutic Exercise: 8-22 mins                     Verner Mould, DPT Acute Rehabilitation Services  Office [phone] Pager [phone]  12/06/2019 2:24 PM

## 2019-12-06 NOTE — Progress Notes (Signed)
ANTICOAGULATION CONSULT NOTE Pharmacy Consult for IV heparin Indication: DVT  Patient Measurements: Height: 5\' 11"  (180.3 cm) Weight: 70.3 kg (155 lb) IBW/kg (Calculated) : 75.3 Heparin Dosing Weight: 70.3 kg  Labs: Recent Labs    12/04/19 0619 12/04/19 1652 12/05/19 0338 12/05/19 0338 12/05/19 2208 12/06/19 0325 12/06/19 1102  HGB 8.8*  --  8.6*  --   --  8.3*  --   HCT 27.5*  --  27.0*  --   --  26.2*  --   PLT 120*  --  114*  --   --  147*  --   HEPARINUNFRC  --    < > 0.47   < > 0.21* 0.25* 0.47  CREATININE 0.71  --  0.86  --   --  0.79  --    < > = values in this interval not displayed.     Assessment: 64 yo male with hx pancreatic cancer and was supposed to have paracentesis tomorrow for further evaluation of cancer presents to ED with abdominal pain. Per Dopplers, has new DVT to start IV heparin per Rx dosing.  12/06/2019   Heparin level  = 0.47 (therapeutic) after heparin gtt increased to 1550 units/hr Hgb down to 8.3; PLTC 147- low but trending up No complications of therapy noted No bleeding reported S/p paracentesis 11/1  Goal of Therapy:  Heparin level 0.3-0.7 units/ml Monitor platelets by anticoagulation protocol: Yes   Plan:   Continue heparin drip to 1550 units/hr  Daily heparin level & CBC  After all invasive procedures completed, consider transition to LMWH 1.5 mg/kg q24 = LMWH 100 mg q24 vs DOAC for long term rx in cancer pt  Eudelia Bunch, Pharm.D 12/06/2019 11:22 AM

## 2019-12-06 NOTE — Progress Notes (Signed)
OT Cancellation Note  Patient Details Name: Isaac Doyle MRN: [id number] DOB: 05-12-1955   Cancelled Treatment:    Reason Eval/Treat Not Completed: Pain limiting ability to participate. Pt speaking with NP (Palliative medicine?!?!), and complaining about 9/10 pain in R side of trunk. Declined therapy or OOB at this time. Per Pt Pt is currently getting HH therapies and aide. OT will continue to follow acutely for evaluation.  [place] 12/06/2019, 11:18 AM   Jesse Sans OTR/L Acute Rehabilitation Services Pager: [phone] Office: [phone]

## 2019-12-06 NOTE — Progress Notes (Signed)
Daily Progress Note   Patient Name: Isaac Doyle       Date: 12/06/2019 DOB: 1956/01/17  Age: 64 y.o. MRN#: [id number] Attending Physician: Eugenie Filler, MD Primary Care Physician: Pcp, No Admit Date: 12/04/2019  Reason for Consultation/Follow-up: Establishing goals of care  Subjective: Met at the bedside with patient's daughter Isaac Doyle and patient.  Patient tells me that she and Isaac Doyle have discussed his conversation with Dr. Burr Medico that he had yesterday.  They both agree that at this point patient's goals of care are to move towards comfort and return home with hospice.  They note that their initial goal was for patient to have surgery and they had previously decided if he was not able to have surgery then they would not proceed with further chemotherapy.  Given the fact that he now has a metastatic process and cannot undergo surgery then they would like to proceed with hospice.  Patient would like to spend as much time at home with his daughter and grandchildren as possible and then moved to residential hospice for the end of his life.  He iterates that he does not want to die in the home.  Patient continues to have uncontrolled pain in his left upper abdomen and chest. He received 2.5 of Dilaudid this morning at 830 and he reports that it did not result in any relief of his pain.  He states he slept well last night, and he does not feel anxious or worried.  Review of Systems  Constitutional: Positive for malaise/fatigue and weight loss.  Cardiovascular: Positive for chest pain.  Gastrointestinal: Positive for abdominal pain.  Psychiatric/Behavioral: The patient is not nervous/anxious and does not have insomnia.     Length of Stay: 2  Current Medications: Scheduled Meds:  .  (feeding supplement) PROSource Plus  30 mL Oral BID BM  . atenolol  100 mg Oral Daily  . Chlorhexidine Gluconate Cloth  6 each Topical Daily  . chlorpheniramine-HYDROcodone  5 mL Oral Q12H  . feeding supplement  1 Container Oral TID BM  . ferrous sulfate  325 mg Oral BID WC  . multivitamin with minerals  1 tablet Oral Daily  . pantoprazole  40 mg Oral Q0600  . potassium chloride SA  40 mEq Oral Daily  . senna-docusate  1 tablet Oral BID  Continuous Infusions: . heparin 1,550 Units/hr (12/06/19 0453)  . magnesium sulfate bolus IVPB 4 g (12/06/19 1026)  . piperacillin-tazobactam (ZOSYN)  IV 3.375 g (12/06/19 0818)    PRN Meds: acetaminophen **OR** acetaminophen, HYDROcodone-acetaminophen, HYDROmorphone (DILAUDID) injection, ondansetron, ondansetron, polyethylene glycol, sodium chloride flush  Physical Exam Vitals and nursing note reviewed.  Constitutional:      Comments: cachectic  Skin:    General: Skin is warm and dry.  Neurological:     Mental Status: He is alert and oriented to person, place, and time.  Psychiatric:        Mood and Affect: Mood normal.        Behavior: Behavior normal.        Thought Content: Thought content normal.             Vital Signs: BP (!) 150/75 (BP Location: Left Arm)   Pulse (!) 58   Temp 98 F (36.7 C) (Oral)   Resp 16   Ht _0  (1.803 m)   Wt 70.3 kg   SpO2 100%   BMI 21.62 kg/m  SpO2: SpO2: 100 % O2 Device: O2 Device: Room Air O2 Flow Rate:    Intake/output summary:   Intake/Output Summary (Last 24 hours) at 12/06/2019 1153 Last data filed at 12/06/2019 0600 Gross per 24 hour  Intake 1732.78 ml  Output 525 ml  Net 1207.78 ml   LBM: Last BM Date: 12/03/19 Baseline Weight: Weight: 70.3 kg Most recent weight: Weight: 70.3 kg       Palliative Assessment/Data: PPS: 50%      Patient Active Problem List   Diagnosis Date Noted  . Protein-calorie malnutrition, severe 12/06/2019  . Advanced care planning/counseling  discussion   . Goals of care, counseling/discussion   . Malignant neoplasm of head of pancreas ([place])   . Hyponatremia 12/04/2019  . Splenic infarct 12/04/2019  . Acute deep vein thrombosis (DVT) of left lower extremity ([place]) 12/04/2019  . Ascites   . Abdominal pain 09/30/2019  . Elevated LFTs   . RUQ abdominal pain   . Severe sepsis with acute organ dysfunction ([place]) 09/23/2019  . 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 Anemia of chronic disease 09/23/2019  . Cholangitis 09/23/2019  . Acute cholecystitis   . Port-A-Cath in place 08/17/2019  . Symptomatic anemia 08/08/2019  . Malignant obstructive jaundice ([place]) 06/03/2019  . Pancreatic cancer metastasized to liver ([place])   . ARF (acute renal failure) ([place]) 05/10/2019  . Essential hypertension 05/10/2019  . Hypokalemia 05/10/2019  . Normochromic normocytic anemia 05/10/2019  . Malignant biliary obstruction ([place])   . Jaundice 05/09/2019    Palliative Care Assessment & Plan   Patient Profile: 64 y.o. 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  Palliative medicine consulted for assistance with goals of care  Assessment/Recommendations/Plan   No further chemotherapy  Maximize medically and discharge home with Hospice  Pain control- increase Dilaudid to 1 mg IV every hour as needed, will reevaluate 24-hour needs tomorrow and transitioning to a long-acting form that can be  used at home with the  breakthrough medication  Goals of Care and Additional Recommendations:  Limitations on Scope of Treatment: Avoid Hospitalization, Minimize Medications and No Chemotherapy  Code Status:  DNR  Prognosis:   < 3 months  Discharge Planning:  Home with Hospice  Care plan was discussed with patient and patient's daughter.   Thank you for allowing the Palliative Medicine Team to assist in the care of this patient.   Total time: 56 mins Greater than 50%  of this time was spent counseling and coordinating care related to the above assessment and plan.  Mariana Kaufman, AGNP-C Palliative Medicine   Please contact Palliative Medicine Team phone at [phone] for questions and concerns.

## 2019-12-07 DIAGNOSIS — I824Z2 Acute embolism and thrombosis of unspecified deep veins of left distal lower extremity: Secondary | ICD-10-CM

## 2019-12-07 DIAGNOSIS — C250 Malignant neoplasm of head of pancreas: Secondary | ICD-10-CM | POA: Diagnosis not present

## 2019-12-07 DIAGNOSIS — R188 Other ascites: Secondary | ICD-10-CM | POA: Diagnosis not present

## 2019-12-07 LAB — RENAL FUNCTION PANEL
Albumin: 1.9 g/dL — ABNORMAL LOW (ref 3.5–5.0)
Anion gap: 6 (ref 5–15)
BUN: 8 mg/dL (ref 8–23)
CO2: 23 mmol/L (ref 22–32)
Calcium: 7.8 mg/dL — ABNORMAL LOW (ref 8.9–10.3)
Chloride: 102 mmol/L (ref 98–111)
Creatinine, Ser: 0.86 mg/dL (ref 0.61–1.24)
GFR, Estimated: >60 mL/min (ref >60)
Glucose, Bld: 129 mg/dL — ABNORMAL HIGH (ref 70–99)
Phosphorus: 2.5 mg/dL (ref 2.5–4.6)
Potassium: 4.1 mmol/L (ref 3.5–5.1)
Sodium: 131 mmol/L — ABNORMAL LOW (ref 135–145)

## 2019-12-07 LAB — MAGNESIUM: Magnesium: 1.9 mg/dL (ref 1.7–2.4)

## 2019-12-07 LAB — HEPARIN LEVEL (UNFRACTIONATED): Heparin Unfractionated: 0.54 IU/mL (ref 0.30–0.70)

## 2019-12-07 LAB — CBC
HCT: 27.9 % — ABNORMAL LOW (ref 39.0–52.0)
Hemoglobin: 8.8 g/dL — ABNORMAL LOW (ref 13.0–17.0)
MCH: 27.1 pg (ref 26.0–34.0)
MCHC: 31.5 g/dL (ref 30.0–36.0)
MCV: 85.8 fL (ref 80.0–100.0)
Platelets: 146 10*3/uL — ABNORMAL LOW (ref 150–400)
RBC: 3.25 MIL/uL — ABNORMAL LOW (ref 4.22–5.81)
RDW: 15.8 % — ABNORMAL HIGH (ref 11.5–15.5)
WBC: 5 10*3/uL (ref 4.0–10.5)
nRBC: 0 % (ref 0.0–0.2)

## 2019-12-07 LAB — CYTOLOGY - NON PAP

## 2019-12-07 MED ORDER — FENTANYL 12 MCG/HR TD PT72
1.0000 | MEDICATED_PATCH | TRANSDERMAL | Status: DC
  Administered 2019-12-07: 1 via TRANSDERMAL
  Filled 2019-12-07: qty 1

## 2019-12-07 MED ORDER — MORPHINE SULFATE 10 MG/5ML PO SOLN: 5.0000 mg | ORAL | Status: DC | PRN

## 2019-12-07 NOTE — Progress Notes (Signed)
Daily Progress Note   Patient Name: Isaac Doyle       Date: 12/07/2019 DOB: July 09, 1955  Age: 64 y.o. MRN#: [id number] Attending Physician: Georgette Shell, MD Primary Care Physician: Pcp, No Admit Date: 12/04/2019  Reason for Consultation/Follow-up: Establishing goals of care  Subjective: Patient in bed.  Again tells me he is confident in his decision not to proceed with chemotherapy.  He has discussed this extensively with his daughter [name]. Regarding pain control, he had a dose of IV Dilaudid last night and then a Vicodin this morning about 2 hours ago, he reports the Vicodin has provided no relief.  We discussed using a fentanyl patch and oral morphine for pain control at home.  I discussed with him the need for bowel prophylaxis to prevent constipation.  Review of Systems  Constitutional: Positive for malaise/fatigue and weight loss.  Cardiovascular: Positive for chest pain.  Gastrointestinal: Positive for abdominal pain.  Psychiatric/Behavioral: The patient is not nervous/anxious and does not have insomnia.     Length of Stay: 3  Current Medications: Scheduled Meds:  . (feeding supplement) PROSource Plus  30 mL Oral BID BM  . atenolol  100 mg Oral Daily  . Chlorhexidine Gluconate Cloth  6 each Topical Daily  . chlorpheniramine-HYDROcodone  5 mL Oral Q12H  . feeding supplement  1 Container Oral TID BM  . fentaNYL  1 patch Transdermal Q72H  . ferrous sulfate  325 mg Oral BID WC  . multivitamin with minerals  1 tablet Oral Daily  . pantoprazole  40 mg Oral Q0600  . potassium chloride SA  40 mEq Oral Daily  . senna-docusate  1 tablet Oral BID    Continuous Infusions: . heparin 1,550 Units/hr (12/06/19 2108)  . piperacillin-tazobactam (ZOSYN)  IV 3.375 g (12/07/19  0822)    PRN Meds: acetaminophen **OR** acetaminophen, HYDROmorphone (DILAUDID) injection, morphine, ondansetron, ondansetron, polyethylene glycol, sodium chloride flush  Physical Exam Vitals and nursing note reviewed.  Constitutional:      Comments: cachectic  Skin:    General: Skin is warm and dry.  Neurological:     Mental Status: He is alert and oriented to person, place, and time.  Psychiatric:        Mood and Affect: Mood normal.        Behavior: Behavior normal.  Thought Content: Thought content normal.             Vital Signs: BP 139/76 (BP Location: Right Arm)   Pulse (!) 58   Temp 98.1 F (36.7 C) (Oral)   Resp 18   Ht 5\' 11"  (1.803 m)   Wt 70.3 kg   SpO2 99%   BMI 21.62 kg/m  SpO2: SpO2: 99 % O2 Device: O2 Device: Room Air O2 Flow Rate:    Intake/output summary:   Intake/Output Summary (Last 24 hours) at 12/07/2019 1139 Last data filed at 12/07/2019 1100 Gross per 24 hour  Intake 120 ml  Output 675 ml  Net -555 ml   LBM: Last BM Date: 12/06/19 Baseline Weight: Weight: 70.3 kg Most recent weight: Weight: 70.3 kg       Palliative Assessment/Data: PPS: 50%      Patient Active Problem List   Diagnosis Date Noted  . Protein-calorie malnutrition, severe 12/06/2019  . Advanced care planning/counseling discussion   . Goals of care, counseling/discussion   . Malignant neoplasm of head of pancreas ([place])   . Hyponatremia 12/04/2019  . Splenic infarct 12/04/2019  . Acute deep vein thrombosis (DVT) of left lower extremity ([place]) 12/04/2019  . Ascites   . Abdominal pain 09/30/2019  . Elevated LFTs   . RUQ abdominal pain   . Severe sepsis with acute organ dysfunction ([place]) 09/23/2019  . 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 Anemia of chronic disease 09/23/2019  . Cholangitis 09/23/2019  . Acute cholecystitis   . Port-A-Cath in place 08/17/2019  . Symptomatic anemia 08/08/2019  . Malignant obstructive  jaundice ([place]) 06/03/2019  . Pancreatic cancer metastasized to liver ([place])   . ARF (acute renal failure) ([place]) 05/10/2019  . Essential hypertension 05/10/2019  . Hypokalemia 05/10/2019  . Normochromic normocytic anemia 05/10/2019  . Malignant biliary obstruction ([place])   . Jaundice 05/09/2019    Palliative Care Assessment & Plan   Patient Profile: 64 y.o. 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  Palliative medicine consulted for assistance with goals of care  Assessment/Recommendations/Plan   DC Vicodin  Start fentanyl patch 12.5 mcg/h every 3 days  Liquid morphine 5 mg every hour as needed for pain or shortness of breath  We will continue IV Dilaudid until fentanyl patch has had time to take effect or patient is discharged  Continue senna as ordered for bowel prophylaxis  Goals of Care and Additional Recommendations:  Limitations on Scope of Treatment: Avoid Hospitalization, Minimize Medications and No Chemotherapy  Code Status:  DNR  Prognosis:   < 3 months  Discharge Planning:  Home with Hospice  Care plan was discussed with patient and patient's daughter.   Thank you for allowing the Palliative Medicine Team to assist in the care of this patient.   Total time: 56 mins Greater than 50%  of this time was spent counseling and coordinating care related to the above assessment and plan.  Mariana Kaufman, AGNP-C Palliative Medicine   Please contact Palliative  Medicine Team phone at [phone]  for questions and concerns.

## 2019-12-07 NOTE — Evaluation (Addendum)
Occupational Therapy Evaluation Patient Details Name: Isaac Doyle MRN: [id number] DOB: 1955/03/11 Today's Date: 12/07/2019    History of Present Illness 64 year old male with medical history of metastatic pancreatic cancer (patient of Dr. Burr Doyle), HTN, CBD obstruction with stent placement, and hepatic abscess s/p drain removal on 11/16/19. He presented to the ED with RUQ abdominal pain x2 days. Pain radiated to back and chest. No N/V/D or fever or chills.   Clinical Impression   [name] is a 64 year old man normally independent with assistive devices and DME who presents with above medical history and complaints of abdominal pain . On evaluation patient demonstrates functional upper body strength, ability to perform functional mobility with two hand holds for steadying and ability to perform BADLs. Patient reports having an aide and family at home to assist with IADLs and near 24/7 assistance. Patient appears to be near baseline - and patient reports the same. Patient's goal is to have pain controlled. Patient has assistance and DME needed for home. No OT needs at this time.    Follow Up Recommendations  No OT follow up    Equipment Recommendations  None recommended by OT    Recommendations for Other Services       Precautions / Restrictions Precautions Precautions: Fall Restrictions Weight Bearing Restrictions: No      Mobility Bed Mobility Overal bed mobility: Modified Independent Bed Mobility: Supine to Sit     Supine to sit: HOB elevated     General bed mobility comments: no assist requires, pt taking extra time and using bed rail.    Transfers Overall transfer level: Needs assistance Equipment used: Straight cane Transfers: Sit to/from Stand Sit to Stand: Min guard         General transfer comment: Min guard for all mobility. Patient using cane in right hand and holding onto furniture with left hand. No physical assistance needed just min guard.  Reports this is how he ambulates at home and reports getting rollator for home use.    Balance Overall balance assessment: Needs assistance Sitting-balance support: No upper extremity supported;Feet supported Sitting balance-Leahy Scale: Good     Standing balance support: Bilateral upper extremity supported;During functional activity Standing balance-Leahy Scale: Fair Standing balance comment: uses both hands to steady himself with ambulation. Able to bring hands off of surfaces for ADls.                           ADL either performed or assessed with clinical judgement   ADL Overall ADL's : At baseline                                       General ADL Comments: Patient reports being at baseline in regards to ADLs. Patient able to ambulate to bathroom with min guard for safety and perform toileting independently. Patient stood at sink to wash hands. Patient demonstrated ability to manage clothing and don socks.     Vision Patient Visual Report: No change from baseline       Perception     Praxis      Pertinent Vitals/Pain Pain Assessment: 0-10 Pain Score: 8  Pain Location: stomach Pain Descriptors / Indicators: Stabbing Pain Intervention(s): Premedicated before session     Hand Dominance Right   Extremity/Trunk Assessment Upper Extremity Assessment Upper Extremity Assessment: Overall WFL for tasks assessed  Lower Extremity Assessment Lower Extremity Assessment: Defer to PT evaluation   Cervical / Trunk Assessment Cervical / Trunk Assessment: Kyphotic   Communication Communication Communication: No difficulties   Cognition Arousal/Alertness: Awake/alert Behavior During Therapy: WFL for tasks assessed/performed Overall Cognitive Status: Within Functional Limits for tasks assessed                                     General Comments       Exercises     Shoulder Instructions      Home Living Family/patient  expects to be discharged to:: Private residence Living Arrangements: Children Available Help at Discharge: Family Type of Home: House Home Access: Stairs to enter Technical brewer of Steps: 2 Entrance Stairs-Rails: Can reach both Home Layout: One level     Bathroom Shower/Tub: Teacher, early years/pre: Standard Bathroom Accessibility: Yes   Home Equipment: Cane - single point;Walker - 2 wheels;Shower seat;Bedside commode;Toilet riser   Additional Comments: pt moved in with his daughter. He has [facility] and [place] [facility]. Aide daily, except Wed from 11-230. Daughter is there in afternoon and evening. Grandson works at night and there during the day.      Prior Functioning/Environment Level of Independence: Independent with assistive device(s)        Comments: Amb with RW or walking stick at home. Pt reports independence with ADL's.        OT Problem List: Pain;Impaired balance (sitting and/or standing)      OT Treatment/Interventions:      OT Goals(Current goals can be found in the care plan section) Acute Rehab OT Goals Patient Stated Goal: To have pain controlled OT Goal Formulation: All assessment and education complete, DC therapy  OT Frequency:     Barriers to D/C:            Co-evaluation              AM-PAC OT "6 Clicks" Daily Activity     Outcome Measure Help from another person eating meals?: None Help from another person taking care of personal grooming?: None Help from another person toileting, which includes using toliet, bedpan, or urinal?: None Help from another person bathing (including washing, rinsing, drying)?: None Help from another person to put on and taking off regular upper body clothing?: None Help from another person to put on and taking off regular lower body clothing?: None 6 Click Score: 24   End of Session Equipment Utilized During Treatment: Other (comment) (cane) Nurse Communication:  (okay to see per  RN)  Activity Tolerance: Patient tolerated treatment well Patient left: in chair;with call bell/phone within reach;with chair alarm set  OT Visit Diagnosis: Pain                Time: 3159-4585 OT Time Calculation (min): 15 min Charges:  OT General Charges $OT Visit: 1 Visit OT Evaluation $OT Eval Low Complexity: 1 Low  Isaac Doyle, OTR/L [place]  Office [phone] Pager: [phone]   Lenward Chancellor 12/07/2019, 10:01 AM

## 2019-12-07 NOTE — Progress Notes (Signed)
PROGRESS NOTE    Isaac Doyle  BWI:[id number] DOB: December 18, 1955 DOA: 12/04/2019 PCP: Pcp, No    Brief Narrative: HPI per Dr. Neysa Bonito This is a 64 year old male with a past medical history of metastatic pancreatic cancer (patient of Dr. Burr Medico), hypertension,CBD obstruction withstent,hepatic abscess s/p drain removal on 11/16/2019 who presented to the ED complaining of RUQ abdominal pain x2 days with radiation to the back and chest. No nausea, vomiting, diarrhea, fever, chills.  ED Course:Afebrile and hemodynamically stable on room air.Notable labs: Sodium 131, alk phos 242,T bili 4.0,albumin 2.1, lipase 17, Hb 8.8(at baseline). CT abdomen pelvis with contrast with small splenic infarct and possible DVT of the LLE in the common femoral and deep femoral veins and known pancreatic cancer with metastatic adenopathy and hepatic masses which are slightly larger than prior. Bilateral lower extremity US: LLE DVT in proximal profunda vein. He was given morphine, 1 L LR bolus and started on a heparin drip.  Assessment & Plan:   Active Problems:   Pancreatic cancer metastasized to liver (HCC)   Anemia of chronic disease   RUQ abdominal pain   Ascites   Hyponatremia   Splenic infarct   Acute deep vein thrombosis (DVT) of left lower extremity ([name])   Advanced care planning/counseling discussion   Goals of care, counseling/discussion   Malignant neoplasm of head of pancreas ([name])   Protein-calorie malnutrition, severe   #1 metastatic pancreatic cancer with splenic infarct bleed to left lower extremity DVT-followed by GI and oncology.  Patient has opted for hospice and to go home with hospice.  Patient has CBD obstruction with stent placement. CT scan of the abdomen this admission with new mets in the liver noted. Patient has poor prognosis and is DNR and plan to discharge home with hospice when family ready this week.   Nutrition Problem: Severe Malnutrition Etiology: chronic illness,  cancer and cancer related treatments     Signs/Symptoms: moderate fat depletion, moderate muscle depletion, severe fat depletion, severe muscle depletion    Interventions: Boost Breeze, MVI, Other (Comment) (prosource plus)  Estimated body mass index is 21.62 kg/m as calculated from the following:   Height as of this encounter: 5' 11" (1.803 m).   Weight as of this encounter: 70.3 kg.  DVT prophylaxis: Heparin Code Status: DNR Family Communication: Discussed with the patient Disposition Plan:  Status is: Inpatient  Dispo: The patient is from: Home              Anticipated d/c is to: Home              Anticipated d/c date is: 2 days              Patient currently is medically stable to d/c.   Consultants:   GI oncology and palliative care   Procedures:CT abdomen and pelvis 12/04/2019  Sound paracentesis 12/05/2019 pending  Lower extremity Dopplers 12/04/2019  Ultrasound-guided diagnostic and therapeutic paracentesis with 1.5 L of yellow fluid removed per IR, Darrell Allred, PA 12/05/2019  Antimicrobials: Zosyn Subjective:  Patient resting in bed he feels like there is no point in continuing chemotherapy and he is requesting to go home with hospice. Objective: Vitals:   12/06/19 1354 12/06/19 2050 12/07/19 0521 12/07/19 1358  BP: 112/73 109/71 139/76 136/79  Pulse: (!) 56 (!) 55 (!) 58 60  Resp: _0 Temp:  (!) 97.5 F (36.4 C) 98.1 F (36.7 C) 98.7 F (37.1 C)  TempSrc:  Oral Oral  SpO2: 100% 100% 99% 100%  Weight:      Height:        Intake/Output Summary (Last 24 hours) at 12/07/2019 1402 Last data filed at 12/07/2019 1100 Gross per 24 hour  Intake 120 ml  Output 675 ml  Net -555 ml   Filed Weights   12/04/19 0456  Weight: 70.3 kg    Examination:  General exam: Appears calm and comfortable  Respiratory system: Clear to auscultation. Respiratory effort normal. Cardiovascular system: S1 & S2 heard, RRR. No JVD, murmurs, rubs, gallops or  clicks. No pedal edema. Gastrointestinal system: Abdomen is nondistended, soft and nontender. No organomegaly or masses felt. Normal bowel sounds heard. Central nervous system: Alert and oriented. No focal neurological deficits. Extremities: Symmetric 5 x 5 power. Skin: No rashes, lesions or ulcers Psychiatry: Judgement and insight appear normal. Mood & affect appropriate.     Data Reviewed: I have personally reviewed following labs and imaging studies  CBC: Recent Labs  Lab 12/04/19 0619 12/05/19 0338 12/06/19 0325 12/07/19 0225  WBC 7.1 5.2 6.2 5.0  NEUTROABS 5.6  --   --   --   HGB 8.8* 8.6* 8.3* 8.8*  HCT 27.5* 27.0* 26.2* 27.9*  MCV 83.6 85.2 84.5 85.8  PLT 120* 114* 147* 141*   Basic Metabolic Panel: Recent Labs  Lab 12/04/19 0619 12/05/19 0338 12/06/19 0325 12/07/19 0220 12/07/19 0225  NA 131* 133* 133* 131*  --   K 3.6 4.8 4.3 4.1  --   CL 101 102 104 102  --   CO2 _0 --   GLUCOSE 126* 113* 119* 129*  --   BUN _1 --   CREATININE 0.71 0.86 0.79 0.86  --   CALCIUM 8.0* 8.0* 7.7* 7.8*  --   MG  --   --  1.3*  --  1.9  PHOS  --   --   --  2.5  --    GFR: Estimated Creatinine Clearance: 85.2 mL/min (by C-G formula based on SCr of 0.86 mg/dL). Liver Function Tests: Recent Labs  Lab 12/04/19 0619 12/05/19 0338 12/06/19 0325 12/07/19 0220  AST 36 19 12*  --   ALT _2 --   ALKPHOS 242* 187* 168*  --   BILITOT 4.0* 2.5* 1.6*  --   PROT 5.8* 5.3* 5.1*  --   ALBUMIN 2.1* 2.0* 1.9* 1.9*   Recent Labs  Lab 12/04/19 0619  LIPASE 17   No results for input(s): AMMONIA in the last 168 hours. Coagulation Profile: No results for input(s): INR, PROTIME in the last 168 hours. Cardiac Enzymes: No results for input(s): CKTOTAL, CKMB, CKMBINDEX, TROPONINI in the last 168 hours. BNP (last 3 results) No results for input(s): PROBNP in the last 8760 hours. HbA1C: No results for input(s): HGBA1C in the last 72 hours. CBG: No results for  input(s): GLUCAP in the last 168 hours. Lipid Profile: No results for input(s): CHOL, HDL, LDLCALC, TRIG, CHOLHDL, LDLDIRECT in the last 72 hours. Thyroid Function Tests: No results for input(s): TSH, T4TOTAL, FREET4, T3FREE, THYROIDAB in the last 72 hours. Anemia Panel: No results for input(s): VITAMINB12, FOLATE, FERRITIN, TIBC, IRON, RETICCTPCT in the last 72 hours. Sepsis Labs: No results for input(s): PROCALCITON, LATICACIDVEN in the last 168 hours.  Recent Results (from the past 240 hour(s))  Respiratory Panel by RT PCR (Flu A&B, Covid) - Nasopharyngeal Swab     Status: None   Collection Time: 12/05/19  8:35 AM   Specimen: Nasopharyngeal Swab  Result Value Ref Range Status   SARS Coronavirus 2 by RT PCR NEGATIVE NEGATIVE Final    Comment: (NOTE) SARS-CoV-2 target nucleic acids are NOT DETECTED.  The SARS-CoV-2 RNA is generally detectable in upper respiratoy specimens during the acute phase of infection. The lowest concentration of SARS-CoV-2 viral copies this assay can detect is 131 copies/mL. A negative result does not preclude SARS-Cov-2 infection and should not be used as the sole basis for treatment or other patient management decisions. A negative result may occur with  improper specimen collection/handling, submission of specimen other than nasopharyngeal swab, presence of viral mutation(s) within the areas targeted by this assay, and inadequate number of viral copies (<131 copies/mL). A negative result must be combined with clinical observations, patient history, and epidemiological information. The expected result is Negative.  Fact Sheet for Patients:  PinkCheek.be  Fact Sheet for Healthcare Providers:  GravelBags.it  This test is no t yet approved or cleared by the [place] FDA and  has been authorized for detection and/or diagnosis of SARS-CoV-2 by FDA under an Emergency Use Authorization (EUA).  This EUA will remain  in effect (meaning this test can be used) for the duration of the COVID-19 declaration under Section 564(b)(1) of the Act, 21 U.S.C. section 360bbb-3(b)(1), unless the authorization is terminated or revoked sooner.     Influenza A by PCR NEGATIVE NEGATIVE Final   Influenza B by PCR NEGATIVE NEGATIVE Final    Comment: (NOTE) The Xpert Xpress SARS-CoV-2/FLU/RSV assay is intended as an aid in  the diagnosis of influenza from Nasopharyngeal swab specimens and  should not be used as a sole basis for treatment. Nasal washings and  aspirates are unacceptable for Xpert Xpress SARS-CoV-2/FLU/RSV  testing.  Fact Sheet for Patients: PinkCheek.be  Fact Sheet for Healthcare Providers: GravelBags.it  This test is not yet approved or cleared by the [place] FDA and  has been authorized for detection and/or diagnosis of SARS-CoV-2 by  FDA under an Emergency Use Authorization (EUA). This EUA will remain  in effect (meaning this test can be used) for the duration of the  Covid-19 declaration under Section 564(b)(1) of the Act, 21  U.S.C. section 360bbb-3(b)(1), unless the authorization is  terminated or revoked. Performed at [facility], [place] [address] [place], [place] [address]          Radiology Studies: US Paracentesis  Result Date: 12/05/2019 INDICATION: Patient with history of metastatic pancreatic cancer, left lower extremity DVT, PE, ascites. Request received for diagnostic and therapeutic paracentesis. EXAM: ULTRASOUND GUIDED DIAGNOSTIC AND THERAPEUTIC PARACENTESIS MEDICATIONS: 1% lidocaine to skin and subcutaneous tissue COMPLICATIONS: None immediate. PROCEDURE: Informed written consent was obtained from the patient after a discussion of the risks, benefits and alternatives to treatment. A timeout was performed prior to the initiation of the procedure. Initial ultrasound scanning  demonstrates a small amount of ascites within the left mid to lower abdominal quadrant. The left mid to lower abdomen was prepped and draped in the usual sterile fashion. 1% lidocaine was used for local anesthesia. Following this, a 19 gauge, 10-cm, Yueh catheter was introduced. An ultrasound image was saved for documentation purposes. The paracentesis was performed. The catheter was removed and a dressing was applied. The patient tolerated the procedure well without immediate post procedural complication. FINDINGS: A total of approximately 1.5 liters of yellow fluid was removed. Samples were sent to the laboratory as requested by the clinical team. IMPRESSION: Successful ultrasound-guided  diagnostic and therapeutic paracentesis yielding 1.5 liters of peritoneal fluid. Read by: Rowe Glade, PA-C Electronically Signed   By: Aletta Edouard M.D.   On: 12/05/2019 15:42        Scheduled Meds: . (feeding supplement) PROSource Plus  30 mL Oral BID BM  . atenolol  100 mg Oral Daily  . Chlorhexidine Gluconate Cloth  6 each Topical Daily  . chlorpheniramine-HYDROcodone  5 mL Oral Q12H  . feeding supplement  1 Container Oral TID BM  . fentaNYL  1 patch Transdermal Q72H  . ferrous sulfate  325 mg Oral BID WC  . multivitamin with minerals  1 tablet Oral Daily  . pantoprazole  40 mg Oral Q0600  . potassium chloride SA  40 mEq Oral Daily  . senna-docusate  1 tablet Oral BID   Continuous Infusions: . heparin 1,550 Units/hr (12/06/19 2108)  . piperacillin-tazobactam (ZOSYN)  IV 3.375 g (12/07/19 0822)     LOS: 3 days     Georgette Shell, MD 12/07/2019, 2:02 PM

## 2019-12-07 NOTE — Progress Notes (Signed)
Patient's daughter Isaac Doyle calls to let Dr. Burr Medico know that the patient has decided to not pursue any further treatment and they are working with the hospital social worker to arranged Hospice at home.  She also wanted to know for herself how much time Dr. Burr Medico feels he may have left.  I spoke with Dr. Burr Medico and called her back and told her that it is difficult to say but we think a couple of months.  She verbalized an understanding. I also let her know that Dr. Burr Medico is planning on seeing the patient today as well.

## 2019-12-07 NOTE — Progress Notes (Signed)
ANTICOAGULATION CONSULT NOTE Pharmacy Consult for IV heparin Indication: DVT  Patient Measurements: Height: 5\' 11"  (180.3 cm) Weight: 70.3 kg (155 lb) IBW/kg (Calculated) : 75.3 Heparin Dosing Weight: 70.3 kg  Labs: Recent Labs    12/05/19 0338 12/05/19 2208 12/06/19 0325 12/06/19 1102 12/07/19 0220 12/07/19 0225  HGB 8.6*  --  8.3*  --   --  8.8*  HCT 27.0*  --  26.2*  --   --  27.9*  PLT 114*  --  147*  --   --  146*  HEPARINUNFRC 0.47   < > 0.25* 0.47 0.54  --   CREATININE 0.86  --  0.79  --  0.86  --    < > = values in this interval not displayed.     Assessment: 64 yo male with hx pancreatic cancer and was supposed to have paracentesis tomorrow for further evaluation of cancer presents to ED with abdominal pain. Per Dopplers, has new DVT to start IV heparin per Rx dosing.  12/07/2019   Heparin level  = 0.54 (therapeutic) on 1550 units/hr Hgb 8.8; PLTC 146- low but trending up No complications of therapy noted No bleeding reported S/p paracentesis 11/1  Goal of Therapy:  Heparin level 0.3-0.7 units/ml Monitor platelets by anticoagulation protocol: Yes   Plan:   Continue heparin drip to 1550 units/hr  Daily heparin level & CBC  After all invasive procedures completed, consider transition to LMWH 1.5 mg/kg q24 = LMWH 100 mg q24 vs DOAC for long term rx in cancer pt  Dolly Rias RPh 12/07/2019, 4:24 AM

## 2019-12-07 NOTE — Progress Notes (Signed)
Manufacturing engineer [facility])  Received request from [facility] for hospice services at home after discharge.  Chart and pt information reviewed by [facility] physician.  Hospice eligibility is confirmed.  Liaisons will continue to follow through discharge to coordinate admission onto hospice services. [place] send signed and completed DNR home with pt/family.  Please provide prescriptions at discharge as needed to ensure ongoing symptom management until patient can be admitted onto hospice services.    [facility] information and contact numbers have been  given to patient and daughter. Above information shared with [place] Manager.  Please call with any questions or concerns.  Thank you for the opportunity to participate in this pt's care.  Domenic Moras, BSN, RN [facility] [phone] (24h on call)

## 2019-12-07 NOTE — Plan of Care (Signed)

## 2019-12-07 NOTE — Progress Notes (Signed)
Pharmacy Antibiotic Note  Isaac Doyle is a 64 y.o. male admitted on 12/04/2019 with intra-abd infection.  Pharmacy has been consulted for Zosyn dosing. 12/07/2019  Zosyn D#4 WBC WNL SCr WNL AF No culture data GI rec 10 days total abx for possibly low grade asc cholangitis with stent  Plan: Zosyn 3.375g IV q8h (4 hour infusion).  ? Change to augmentin to complete course  Height: 5\' 11"  (180.3 cm) Weight: 70.3 kg (155 lb) IBW/kg (Calculated) : 75.3  Temp (24hrs), Avg:97.8 F (36.6 C), Min:97.5 F (36.4 C), Max:98.1 F (36.7 C)  Recent Labs  Lab 12/04/19 0619 12/05/19 0338 12/06/19 0325 12/07/19 0220 12/07/19 0225  WBC 7.1 5.2 6.2  --  5.0  CREATININE 0.71 0.86 0.79 0.86  --     Estimated Creatinine Clearance: 85.2 mL/min (by C-G formula based on SCr of 0.86 mg/dL).    No Known Allergies  Antimicrobials this admission: 10/31 Zosyn >>  Dose adjustments this admission:  Microbiology results: 11/1 covid/flu neg  Thank you for allowing pharmacy to be a part of this patient's care.  Katlen Seyer, [place] [phone] 12/07/2019 7:09 AM
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
## 2019-12-08 DIAGNOSIS — E43 Unspecified severe protein-calorie malnutrition: Secondary | ICD-10-CM

## 2019-12-08 DIAGNOSIS — Z515 Encounter for palliative care: Secondary | ICD-10-CM

## 2019-12-08 LAB — CBC
HCT: 25.7 % — ABNORMAL LOW (ref 39.0–52.0)
Hemoglobin: 8.1 g/dL — ABNORMAL LOW (ref 13.0–17.0)
MCH: 27 pg (ref 26.0–34.0)
MCHC: 31.5 g/dL (ref 30.0–36.0)
MCV: 85.7 fL (ref 80.0–100.0)
Platelets: 136 10*3/uL — ABNORMAL LOW (ref 150–400)
RBC: 3 MIL/uL — ABNORMAL LOW (ref 4.22–5.81)
RDW: 15.6 % — ABNORMAL HIGH (ref 11.5–15.5)
WBC: 4.3 10*3/uL (ref 4.0–10.5)
nRBC: 0 % (ref 0.0–0.2)

## 2019-12-08 LAB — HEPARIN LEVEL (UNFRACTIONATED): Heparin Unfractionated: 0.66 IU/mL (ref 0.30–0.70)

## 2019-12-08 MED ORDER — APIXABAN 5 MG PO TABS
5.0000 mg | ORAL_TABLET | Freq: Two times a day (BID) | ORAL | Status: DC
  Administered 2019-12-08: 5 mg via ORAL
  Filled 2019-12-08: qty 1

## 2019-12-08 MED ORDER — POLYETHYLENE GLYCOL 3350 17 G PO PACK: 17.0000 g | PACK | Freq: Every day | ORAL | 0 refills | Status: AC | PRN

## 2019-12-08 MED ORDER — APIXABAN 5 MG PO TABS: 5.0000 mg | ORAL_TABLET | Freq: Two times a day (BID) | ORAL | 2 refills | Status: AC

## 2019-12-08 MED ORDER — FENTANYL 12 MCG/HR TD PT72: 1.0000 | MEDICATED_PATCH | TRANSDERMAL | 0 refills | Status: DC

## 2019-12-08 MED ORDER — HEPARIN SOD (PORK) LOCK FLUSH 100 UNIT/ML IV SOLN
500.0000 [IU] | INTRAVENOUS | Status: AC | PRN
  Administered 2019-12-08: 500 [IU]
  Filled 2019-12-08: qty 5

## 2019-12-08 MED ORDER — APIXABAN (ELIQUIS) VTE STARTER PACK (10MG AND 5MG): ORAL_TABLET | ORAL | 0 refills | Status: DC

## 2019-12-08 MED ORDER — MORPHINE SULFATE 10 MG/5ML PO SOLN: 5.0000 mg | ORAL | 0 refills | Status: AC | PRN

## 2019-12-08 NOTE — Progress Notes (Addendum)
[place] for IV heparin>>apixaban Indication: DVT  Patient Measurements: Height: 5\' 11"  (180.3 cm) Weight: 70.3 kg (155 lb) IBW/kg (Calculated) : 75.3 Heparin Dosing Weight: 70.3 kg  Labs: Recent Labs    12/06/19 0325 12/06/19 0325 12/06/19 1102 12/07/19 0220 12/07/19 0225 12/08/19 0448  HGB 8.3*   < >  --   --  8.8* 8.1*  HCT 26.2*  --   --   --  27.9* 25.7*  PLT 147*  --   --   --  146* 136*  HEPARINUNFRC 0.25*   < > 0.47 0.54  --  0.66  CREATININE 0.79  --   --  0.86  --   --    < > = values in this interval not displayed.     Assessment: 64 yo male with hx pancreatic cancer and was supposed to have paracentesis tomorrow for further evaluation of cancer presents to ED with abdominal pain. Per Dopplers, has new DVT to start IV heparin per Rx dosing.  12/08/2019  Heparin level  = 0.66 (therapeutic) on 1550 units/hr Hgb 8.1; PLTC 136- low but stable No complications of therapy noted No bleeding reported S/p paracentesis 11/1  Goal of Therapy:  Heparin level 0.3-0.7 units/ml Monitor platelets by anticoagulation protocol: Yes   Plan:   Continue heparin drip to 1550 units/hr  Daily heparin level & CBC  ? Transition to [place], Pharm.D 12/08/2019 8:15 AM  Addendum: To transition to apixaban for DVT treatment Has had 5 days of IV heparin so doesn't need 10 mg BID loading dose   Plan:  Dc heparin drip Start apixaban 5 mg po bid Will educate and give 30 day free card  Eudelia Bunch, Pharm.D 12/08/2019 11:48 AM

## 2019-12-08 NOTE — Plan of Care (Signed)

## 2019-12-08 NOTE — Progress Notes (Addendum)
Daily Progress Note   Patient Name: Isaac Doyle       Date: 12/08/2019 DOB: [birth year]  Age: 64 y.o. MRN#: [id number] Attending Physician: Georgette Shell, MD Primary Care Physician: Pcp, No Admit Date: 12/04/2019  Reason for Consultation/Follow-up: Establishing goals of care  Subjective: Patient is resting comfortably in bed.  Per nursing he did not require any as needed pain medicines last night did need to dose this morning but that appears to have relieved his pain.  Hospice has been arranged at home and he is likely to be discharged today.  Review of Systems  Constitutional: Positive for malaise/fatigue and weight loss.  Cardiovascular: Positive for chest pain.  Gastrointestinal: Positive for abdominal pain.  Psychiatric/Behavioral: The patient is not nervous/anxious and does not have insomnia.     Length of Stay: 4  Current Medications: Scheduled Meds:  . (feeding supplement) PROSource Plus  30 mL Oral BID BM  . apixaban  5 mg Oral BID  . atenolol  100 mg Oral Daily  . Chlorhexidine Gluconate Cloth  6 each Topical Daily  . chlorpheniramine-HYDROcodone  5 mL Oral Q12H  . feeding supplement  1 Container Oral TID BM  . fentaNYL  1 patch Transdermal Q72H  . ferrous sulfate  325 mg Oral BID WC  . multivitamin with minerals  1 tablet Oral Daily  . pantoprazole  40 mg Oral Q0600  . potassium chloride SA  40 mEq Oral Daily  . senna-docusate  1 tablet Oral BID    Continuous Infusions: . piperacillin-tazobactam (ZOSYN)  IV 3.375 g (12/08/19 0748)    PRN Meds: acetaminophen **OR** acetaminophen, HYDROmorphone (DILAUDID) injection, morphine, ondansetron, ondansetron, polyethylene glycol, sodium chloride flush  Physical Exam Vitals and nursing note reviewed.    Constitutional:      Comments: cachectic  Skin:    General: Skin is warm and dry.  Neurological:     Mental Status: He is alert and oriented to person, place, and time.  Psychiatric:        Mood and Affect: Mood normal.        Behavior: Behavior normal.        Thought Content: Thought content normal.             Vital Signs: BP (!) 152/74 (BP Location: Right Arm)   Pulse  61   Temp 98.4 F (36.9 C) (Oral)   Resp 16   Ht 5\' 11"  (1.803 m)   Wt 70.3 kg   SpO2 100%   BMI 21.62 kg/m  SpO2: SpO2: 100 % O2 Device: O2 Device: Room Air O2 Flow Rate:    Intake/output summary:   Intake/Output Summary (Last 24 hours) at 12/08/2019 1205 Last data filed at 12/08/2019 0720 Gross per 24 hour  Intake 1270.03 ml  Output 825 ml  Net 445.03 ml   LBM: Last BM Date: 12/07/19 Baseline Weight: Weight: 70.3 kg Most recent weight: Weight: 70.3 kg       Palliative Assessment/Data: PPS: 50%      Patient Active Problem List   Diagnosis Date Noted  . Protein-calorie malnutrition, severe 12/06/2019  . Advanced care planning/counseling discussion   . Goals of care, counseling/discussion   . Malignant neoplasm of head of pancreas ([place])   . Hyponatremia 12/04/2019  . Splenic infarct 12/04/2019  . Acute deep vein thrombosis (DVT) of left lower extremity ([place]) 12/04/2019  . Ascites   . Abdominal pain 09/30/2019  . Elevated LFTs   . RUQ abdominal pain   . Severe sepsis with acute organ dysfunction ([place]) 09/23/2019  . 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 Anemia of chronic disease 09/23/2019  . Cholangitis 09/23/2019  . Acute cholecystitis   . Port-A-Cath in place 08/17/2019  . Symptomatic anemia 08/08/2019  . Malignant obstructive jaundice ([place]) 06/03/2019  . Pancreatic cancer metastasized to liver ([place])   . ARF (acute renal failure) ([place]) 05/10/2019  . Essential hypertension 05/10/2019  . Hypokalemia 05/10/2019  . Normochromic normocytic anemia  05/10/2019  . Malignant biliary obstruction ([place])   . Jaundice 05/09/2019    Palliative Care Assessment & Plan   Patient Profile: 64 y.o. 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  Palliative medicine consulted for assistance with goals of care  Assessment/Recommendations/Plan   Plan for d/c home with Hospice today   Goals of Care and Additional Recommendations:  Limitations on Scope of Treatment: Avoid Hospitalization, Minimize Medications and No Chemotherapy  Code Status:  DNR  Prognosis:   < 3 months  Discharge Planning:  Home with Hospice  Care plan was discussed with patient and patient's daughter.   Thank you for allowing the Palliative Medicine Team to assist in the care of this patient.   Total time: 18 mins Greater than 50%  of this time was spent counseling and coordinating care related to the above assessment and plan.  Mariana Kaufman, AGNP-C Palliative Medicine   Please contact Palliative Medicine Team phone at [phone] for questions and concerns.

## 2019-12-08 NOTE — Discharge Summary (Signed)
Physician Discharge Summary  Isaac Doyle KGM:[id number] DOB: August 25, 1955 DOA: 12/04/2019  PCP: Pcp, No  Admit date: 12/04/2019 Discharge date: 12/08/2019  Admitted From:home Disposition: home Recommendations for Outpatient Follow-up: Hospice discharge   Home Health: None Equipment/Devices: None  Discharge Condition hospice CODE STATUS: DO NOT RESUSCITATE Diet recommendation: Regular diet   brief/Interim Summary:64 year old male with a past medical history of metastatic pancreatic cancer (patient of Dr. Burr Medico), hypertension,CBD obstruction withstent,hepatic abscess s/p drain removal on 11/16/2019 who presented to the ED complaining of RUQ abdominal pain x2 days with radiation to the back and chest. No nausea, vomiting, diarrhea, fever, chills.   Discharge Diagnoses:  Active Problems:   Pancreatic cancer metastasized to liver (HCC)   Anemia of chronic disease   RUQ abdominal pain   Ascites   Hyponatremia   Splenic infarct   Acute deep vein thrombosis (DVT) of left lower extremity ([place])   Advanced care planning/counseling discussion   Goals of care, counseling/discussion   Malignant neoplasm of head of pancreas ([place])   Protein-calorie malnutrition, severe     #1 metastatic pancreatic cancer with splenic infarct bleed to left lower extremity DVT-was followed by GI and oncology.  Patient has opted for hospice and to go home with hospice.  Patient has CBD obstruction with stent placement. CT scan of the abdomen this admission with new mets in the liver noted. Patient has poor prognosis and is DNR and plan to discharge home with hospice when family ready this week. We will discharge him on Eliquis 5 mg twice a day.  Nutrition Problem: Severe Malnutrition Etiology: chronic illness, cancer and cancer related treatments    Signs/Symptoms: moderate fat depletion, moderate muscle depletion, severe fat depletion, severe muscle depletion     Interventions: Boost Breeze,  MVI, Other (Comment) (prosource plus)  Estimated body mass index is 21.62 kg/m as calculated from the following:   Height as of this encounter: 5\' 11"  (1.803 m).   Weight as of this encounter: 70.3 kg.  Discharge Instructions  Discharge Instructions    Diet - low sodium heart healthy   Complete by: As directed    Increase activity slowly   Complete by: As directed      Allergies as of 12/08/2019   No Known Allergies     Medication List    STOP taking these medications   amoxicillin-clavulanate 875-125 MG tablet Commonly known as: AUGMENTIN   cetirizine 10 MG tablet Commonly known as: ZYRTEC   diphenoxylate-atropine 2.5-0.025 MG tablet Commonly known as: LOMOTIL   ferrous sulfate 325 (65 FE) MG tablet   guaifenesin 400 MG Tabs tablet Commonly known as: HUMIBID E   hydrocortisone 2.5 % rectal cream Commonly known as: ANUSOL-HC   lidocaine-prilocaine cream Commonly known as: EMLA   loperamide 2 MG capsule Commonly known as: IMODIUM   magnesium oxide 400 (241.3 Mg) MG tablet Commonly known as: MAG-OX   methocarbamol 500 MG tablet Commonly known as: Robaxin   pantoprazole 40 MG tablet Commonly known as: PROTONIX   potassium chloride SA 20 MEQ tablet Commonly known as: KLOR-CON   traMADol 50 MG tablet Commonly known as: Ultram     TAKE these medications   Apixaban Starter Pack (10mg  and 5mg ) Commonly known as: ELIQUIS STARTER PACK Take as directed on package: start with two-5mg  tablets twice daily for 7 days. On day 8, switch to one-5mg  tablet twice daily.   atenolol 100 MG tablet Commonly known as: TENORMIN Take 1 tablet (100 mg total) by mouth daily. Hold  until follow-up with your PCP What changed: additional instructions   fentaNYL 12 MCG/HR Commonly known as: [place] 1 patch onto the skin every 3 (three) days. Start taking on: December 10, 2019   morphine 10 MG/5ML solution Take 2.5 mLs (5 mg total) by mouth every hour as needed for  severe pain (breakthrough pain, shortness of breath).   ondansetron 4 MG disintegrating tablet Commonly known as: Zofran ODT Take 1 tablet (4 mg total) by mouth every 8 (eight) hours as needed for nausea or vomiting.   polyethylene glycol 17 g packet Commonly known as: MIRALAX / GLYCOLAX Take 17 g by mouth daily as needed for mild constipation.       No Known Allergies  Consultations:  Oncology and GI   Procedures/Studies: CT ABDOMEN PELVIS W WO CONTRAST  Result Date: 11/16/2019 CLINICAL DATA:  64 year old male with history of pancreatic cancer and perihepatic biloma. EXAM: CT ABDOMEN AND PELVIS WITHOUT AND WITH CONTRAST TECHNIQUE: Multidetector CT imaging of the abdomen and pelvis was performed following the standard protocol before and following the bolus administration of intravenous contrast. CONTRAST:  175mL ISOVUE-300 IOPAMIDOL (ISOVUE-300) INJECTION 61% COMPARISON:  11/02/2018 FINDINGS: Lower chest: Trace bilateral pleural effusions, similar on the left but new on the right. The heart is normal in size. No pericardial effusion. Hepatobiliary: Smooth contour normal attenuation of the hepatic parenchyma. No focal lesion. The gallbladder is present with multiple calcified layering gallstones, unchanged from comparison without pericholecystic fluid or gallbladder wall thickening. Similar-appearing anti dependent diffuse pneumobilia in indwelling metallic common bile duct stent which appears patent. Pancreas: Subtle and ill-defined hypoattenuation in the head/mid body of the pancreas compatible with known malignancy. There appears to be encasement of the portal vein at the level of the splenic and SMV confluence. The remaining portal vein is patent and normal caliber. There is abutment of the proximal SMA. No evidence of celiac involvement. No pancreatic enhancing masses or ductal dilation. Spleen: Normal in size without focal abnormality. Adrenals/Urinary Tract: The adrenal glands are  symmetric in size and morphology bilaterally, within normal limits. Similar appearing multiple bilateral simple renal cysts. No nephrolithiasis or hydronephrosis. The bladder is nondistended. Stomach/Bowel: Stomach is within normal limits. Appendix appears normal. Scattered colonic diverticula. No evidence of bowel wall thickening, distention, or inflammatory changes. Vascular/Lymphatic: No significant vascular findings are present. No enlarged abdominal or pelvic lymph nodes. Reproductive: Prostate is unremarkable. Other: Small volume ascites. Interval decreased size of previously visualized right perihepatic fluid collection with indwelling drainage catheter which remains in good position. The fluid collection measures up to approximately 1 by 7 cm in greatest axial dimension, previously 1.1 x 8.3 cm. There appears to be thickening of the fluid collection rind. Musculoskeletal: No acute fracture or aggressive appearing osseous lesion. Multilevel degenerative changes of the visualized thoracolumbar spine with unchanged anterior wedge compression deformity of the T12 vertebral body and partial ankylosis of T11-T12. IMPRESSION: 1. Similar appearance of previously described and biopsied ill-defined pancreatic head/body region of hypodensity compatible with pancreatic adenocarcinoma. The mass encases the portal confluence with associated high-grade stenosis. The portal system is otherwise patent. There is abutment of the proximal superior mesenteric artery. 2. Unchanged appearance of indwelling common bile duct stent with scattered pneumobilia. 3. Continued decreasing size of the previously visualized right perihepatic biloma with unchanged position of indwelling pigtail drain. 4. Small volume ascites. Ruthann Cancer, MD [facility] Electronically Signed   By: Ruthann Cancer MD   On: 11/16/2019 14:50  CT ABDOMEN PELVIS W CONTRAST  Result Date:  12/04/2019 CLINICAL DATA:  Right upper quadrant abdominal pain. History of pancreatic carcinoma. EXAM: CT ABDOMEN AND PELVIS WITH CONTRAST TECHNIQUE: Multidetector CT imaging of the abdomen and pelvis was performed using the standard protocol following bolus administration of intravenous contrast. CONTRAST:  172mL OMNIPAQUE IOHEXOL 300 MG/ML  SOLN COMPARISON:  11/16/2019 and older CTs. FINDINGS: Lower chest: Small left pleural effusion. Mild dependent atelectasis in the posterior left lower lobe. Small area of opacity in the peripheral right lower lobe consistent with atelectasis or scarring, similar to the prior study. Hepatobiliary: Liver normal in size. Multiple small hypoattenuating liver masses consistent with metastatic disease. These all appear subcentimeter in size. These appear increased compared to the prior CT. Reference measurement of a lesion at the posterior aspect of segment 7 currently measures 8 mm, 3-4 mm on the prior CT. There is intrahepatic biliary air that is unchanged. Gallbladder is distended with dependent stones and non dependent air, similar to the prior CT. A biliary stent extends from the peripheral porta hepatis to the ampulla of Vater, unchanged. Pancreas: Pancreatic mass is not well-defined. Most of the pancreas shows depressed enhancement. There are peripancreatic lymph nodes, largest 1.4 cm in short axis, consistent with metastatic disease. These findings are stable from the prior CT. Spleen: Peripheral, wedge-shaped hypoattenuating area is mid to upper spleen measuring 16 x 12 mm, in not evident on the prior CT, likely a small infarct. Spleen normal in size with no other lesions. Adrenals/Urinary Tract: No adrenal masses. Kidneys normal in overall size, orientation and position with symmetric enhancement and excretion. Low-density renal masses consistent with cysts, larger the left, are stable. No hydronephrosis. Normal ureters. Normal bladder. Stomach/Bowel: Stomach is  unremarkable. Small bowel and colon are normal in caliber. No wall thickening. No evidence of inflammation. Multiple sigmoid diverticula without diverticulitis. Vascular/Lymphatic: Possible deep venous thrombosis at the level of the left common femoral vein also suggested in the left deep femoral vein. This could be from incomplete opacification and not thrombus. Mild aortic atherosclerosis.  No aneurysm. Portal vein is compressed by the pancreatic mass, with the confluence of the portal vein, splenic vein and superior mesenteric vein mostly effaced. This is similar to the prior CT. Porta hepatis adenopathy, stable from the prior CT. No other enlarged lymph nodes. Reproductive: Unremarkable. Other: Small to moderate amount of ascites, unchanged from the prior CT. Musculoskeletal: Chronic mild compression fracture of T12. No acute fractures. No osteoblastic or osteolytic lesions. IMPRESSION: 1. No definite acute abnormality within the abdomen or pelvis. 2. Gallbladder is distended with dependent stones and non dependent air, but no definite wall thickening. Early acute cholecystitis felt unlikely given the stable appearance from the prior CT, although not excluded. 3. Findings of pancreatic carcinoma and metastatic disease including peripancreatic metastatic adenopathy, multiple small liver masses and ascites. Small liver masses appear increased when compared to the prior CT consistent with metastatic disease progression. 4. New small wedge-shaped area of hypoattenuation in the spleen. Suspect a small infarct. 5. Possible deep venous thrombosis of the left lower extremity in the common femoral and deep femoral veins. Consider follow-up left lower extremity venous duplex exam if there are clinical findings supporting deep venous thrombosis. Electronically Signed   By: Lajean Manes M.D.   On: 12/04/2019 07:29   US Paracentesis  Result Date: 12/05/2019 INDICATION: Patient with history of metastatic pancreatic  cancer, left lower extremity DVT, PE, ascites. Request received for diagnostic and therapeutic paracentesis. EXAM: ULTRASOUND  GUIDED DIAGNOSTIC AND THERAPEUTIC PARACENTESIS MEDICATIONS: 1% lidocaine to skin and subcutaneous tissue COMPLICATIONS: None immediate. PROCEDURE: Informed written consent was obtained from the patient after a discussion of the risks, benefits and alternatives to treatment. A timeout was performed prior to the initiation of the procedure. Initial ultrasound scanning demonstrates a small amount of ascites within the left mid to lower abdominal quadrant. The left mid to lower abdomen was prepped and draped in the usual sterile fashion. 1% lidocaine was used for local anesthesia. Following this, a 19 gauge, 10-cm, Yueh catheter was introduced. An ultrasound image was saved for documentation purposes. The paracentesis was performed. The catheter was removed and a dressing was applied. The patient tolerated the procedure well without immediate post procedural complication. FINDINGS: A total of approximately 1.5 liters of yellow fluid was removed. Samples were sent to the laboratory as requested by the clinical team. IMPRESSION: Successful ultrasound-guided diagnostic and therapeutic paracentesis yielding 1.5 liters of peritoneal fluid. Read by: Rowe Yovan, PA-C Electronically Signed   By: Aletta Edouard M.D.   On: 12/05/2019 15:42   VAS [place] LOWER EXTREMITY VENOUS (DVT) (ONLY [facility] & [facility])  Result Date: 12/04/2019  Lower Venous DVTStudy Indications: F/U CT findings.  Risk Factors: Cancer malignant neoplasm of pancreas. Comparison Study: No prior studies. Performing Technologist: Darlin Coco, RDMS  Examination Guidelines: A complete evaluation includes B-mode imaging, spectral Doppler, color Doppler, and power Doppler as needed of all accessible portions of each vessel. Bilateral testing is considered an integral part of a complete examination. Limited examinations for reoccurring indications may  be performed as noted. The reflux portion of the exam is performed with the patient in reverse Trendelenburg.  +-----+---------------+---------+-----------+----------+--------------+ RIGHTCompressibilityPhasicitySpontaneityPropertiesThrombus Aging +-----+---------------+---------+-----------+----------+--------------+ CFV  Full           Yes      Yes                                 +-----+---------------+---------+-----------+----------+--------------+   +---------+---------------+---------+-----------+----------+--------------+ LEFT     CompressibilityPhasicitySpontaneityPropertiesThrombus Aging +---------+---------------+---------+-----------+----------+--------------+ CFV      Full           Yes      Yes                                 +---------+---------------+---------+-----------+----------+--------------+ SFJ      Full                                                        +---------+---------------+---------+-----------+----------+--------------+ FV Prox  Full                                                        +---------+---------------+---------+-----------+----------+--------------+ FV Mid   Full                                                        +---------+---------------+---------+-----------+----------+--------------+ FV DistalFull                                                        +---------+---------------+---------+-----------+----------+--------------+  PFV      Partial        Yes      Yes                  Acute          +---------+---------------+---------+-----------+----------+--------------+ POP      Full           Yes      Yes                                 +---------+---------------+---------+-----------+----------+--------------+ PTV      Full                                                        +---------+---------------+---------+-----------+----------+--------------+ PERO     Full                                                         +---------+---------------+---------+-----------+----------+--------------+     Summary: RIGHT: - No evidence of common femoral vein obstruction.  LEFT: - Findings consistent with acute deep vein thrombosis involving the left proximal profunda vein. - No cystic structure found in the popliteal fossa.  *See table(s) above for measurements and observations. Electronically signed by Deitra Mayo MD on 12/04/2019 at 2:08:26 PM.    Final    IR Radiologist Eval & Mgmt  Result Date: 11/16/2019 Please refer to notes tab for details about interventional procedure. (Op Note)   (Echo, Carotid, EGD, Colonoscopy, ERCP)    Subjective:  Patient resting in bed anxious to go home with hospice discussed with daughter Discharge Exam: Vitals:   12/07/19 2237 12/08/19 0556  BP: (!) 157/84 (!) 152/74  Pulse: 62 61  Resp: 14 16  Temp: 98.2 F (36.8 C) 98.4 F (36.9 C)  SpO2: 100% 100%   Vitals:   12/07/19 0521 12/07/19 1358 12/07/19 2237 12/08/19 0556  BP: 139/76 136/79 (!) 157/84 (!) 152/74  Pulse: (!) 58 60 62 61  Resp: 18 20 14 16   Temp: 98.1 F (36.7 C) 98.7 F (37.1 C) 98.2 F (36.8 C) 98.4 F (36.9 C)  TempSrc: Oral  Oral Oral  SpO2: 99% 100% 100% 100%  Weight:      Height:        General: Pt is alert, awake, not in acute distress Cardiovascular: RRR, S1/S2 +, no rubs, no gallops Respiratory: CTA bilaterally, no wheezing, no rhonchi Abdominal: Soft, NT, ND, bowel sounds + Extremities: no edema, no cyanosis    The results of significant diagnostics from this hospitalization (including imaging, microbiology, ancillary and laboratory) are listed below for reference.     Microbiology: Recent Results (from the past 240 hour(s))  Respiratory Panel by RT PCR (Flu A&B, Covid) - Nasopharyngeal Swab     Status: None   Collection Time: 12/05/19  8:35 AM   Specimen: Nasopharyngeal Swab  Result Value Ref Range Status   SARS Coronavirus 2  by RT PCR NEGATIVE NEGATIVE Final    Comment: (NOTE) SARS-CoV-2 target nucleic acids are NOT DETECTED.  The SARS-CoV-2 RNA is generally detectable in upper respiratoy  specimens during the acute phase of infection. The lowest concentration of SARS-CoV-2 viral copies this assay can detect is 131 copies/mL. A negative result does not preclude SARS-Cov-2 infection and should not be used as the sole basis for treatment or other patient management decisions. A negative result may occur with  improper specimen collection/handling, submission of specimen other than nasopharyngeal swab, presence of viral mutation(s) within the areas targeted by this assay, and inadequate number of viral copies (<131 copies/mL). A negative result must be combined with clinical observations, patient history, and epidemiological information. The expected result is Negative.  Fact Sheet for Patients:  PinkCheek.be  Fact Sheet for Healthcare Providers:  GravelBags.it  This test is no t yet approved or cleared by the [place] FDA and  has been authorized for detection and/or diagnosis of SARS-CoV-2 by FDA under an Emergency Use Authorization (EUA). This EUA will remain  in effect (meaning this test can be used) for the duration of the COVID-19 declaration under Section 564(b)(1) of the Act, 21 U.S.C. section 360bbb-3(b)(1), unless the authorization is terminated or revoked sooner.     Influenza A by PCR NEGATIVE NEGATIVE Final   Influenza B by PCR NEGATIVE NEGATIVE Final    Comment: (NOTE) The Xpert Xpress SARS-CoV-2/FLU/RSV assay is intended as an aid in  the diagnosis of influenza from Nasopharyngeal swab specimens and  should not be used as a sole basis for treatment. Nasal washings and  aspirates are unacceptable for Xpert Xpress SARS-CoV-2/FLU/RSV  testing.  Fact Sheet for Patients: PinkCheek.be  Fact Sheet  for Healthcare Providers: GravelBags.it  This test is not yet approved or cleared by the [place] FDA and  has been authorized for detection and/or diagnosis of SARS-CoV-2 by  FDA under an Emergency Use Authorization (EUA). This EUA will remain  in effect (meaning this test can be used) for the duration of the  Covid-19 declaration under Section 564(b)(1) of the Act, 21  U.S.C. section 360bbb-3(b)(1), unless the authorization is  terminated or revoked. Performed at [facility], [place] [address], [place], [place] [address]      Labs: BNP (last 3 results) Recent Labs    08/08/19 1544  BNP 58.5   Basic Metabolic Panel: Recent Labs  Lab 12/04/19 0619 12/05/19 0338 12/06/19 0325 12/07/19 0220 12/07/19 0225  NA 131* 133* 133* 131*  --   K 3.6 4.8 4.3 4.1  --   CL 101 102 104 102  --   CO2 22 24 22 23   --   GLUCOSE 126* 113* 119* 129*  --   BUN 8 9 10 8   --   CREATININE 0.71 0.86 0.79 0.86  --   CALCIUM 8.0* 8.0* 7.7* 7.8*  --   MG  --   --  1.3*  --  1.9  PHOS  --   --   --  2.5  --    Liver Function Tests: Recent Labs  Lab 12/04/19 0619 12/05/19 0338 12/06/19 0325 12/07/19 0220  AST 36 19 12*  --   ALT 16 12 11   --   ALKPHOS 242* 187* 168*  --   BILITOT 4.0* 2.5* 1.6*  --   PROT 5.8* 5.3* 5.1*  --   ALBUMIN 2.1* 2.0* 1.9* 1.9*   Recent Labs  Lab 12/04/19 0619  LIPASE 17   No results for input(s): AMMONIA in the last 168 hours. CBC: Recent Labs  Lab 12/04/19 0619 12/05/19 0338 12/06/19 0325 12/07/19 0225 12/08/19 0448  WBC  7.1 5.2 6.2 5.0 4.3  NEUTROABS 5.6  --   --   --   --   HGB 8.8* 8.6* 8.3* 8.8* 8.1*  HCT 27.5* 27.0* 26.2* 27.9* 25.7*  MCV 83.6 85.2 84.5 85.8 85.7  PLT 120* 114* 147* 146* 136*   Cardiac Enzymes: No results for input(s): CKTOTAL, CKMB, CKMBINDEX, TROPONINI in the last 168 hours. BNP: Invalid input(s): POCBNP CBG: No results for input(s): GLUCAP in the last 168  hours. D-Dimer No results for input(s): DDIMER in the last 72 hours. Hgb A1c No results for input(s): HGBA1C in the last 72 hours. Lipid Profile No results for input(s): CHOL, HDL, LDLCALC, TRIG, CHOLHDL, LDLDIRECT in the last 72 hours. Thyroid function studies No results for input(s): TSH, T4TOTAL, T3FREE, THYROIDAB in the last 72 hours.  Invalid input(s): FREET3 Anemia work up No results for input(s): VITAMINB12, FOLATE, FERRITIN, TIBC, IRON, RETICCTPCT in the last 72 hours. Urinalysis    Component Value Date/Time   COLORURINE AMBER (A) 09/23/2019 0848   APPEARANCEUR CLOUDY (A) 09/23/2019 0848   LABSPEC 1.023 09/23/2019 0848   PHURINE 5.0 09/23/2019 0848   GLUCOSEU NEGATIVE 09/23/2019 0848   HGBUR SMALL (A) 09/23/2019 0848   BILIRUBINUR NEGATIVE 09/23/2019 0848   KETONESUR NEGATIVE 09/23/2019 0848   PROTEINUR 100 (A) 09/23/2019 0848   NITRITE NEGATIVE 09/23/2019 0848   LEUKOCYTESUR NEGATIVE 09/23/2019 0848   Sepsis Labs Invalid input(s): PROCALCITONIN,  WBC,  LACTICIDVEN Microbiology Recent Results (from the past 240 hour(s))  Respiratory Panel by RT PCR (Flu A&B, Covid) - Nasopharyngeal Swab     Status: None   Collection Time: 12/05/19  8:35 AM   Specimen: Nasopharyngeal Swab  Result Value Ref Range Status   SARS Coronavirus 2 by RT PCR NEGATIVE NEGATIVE Final    Comment: (NOTE) SARS-CoV-2 target nucleic acids are NOT DETECTED.  The SARS-CoV-2 RNA is generally detectable in upper respiratoy specimens during the acute phase of infection. The lowest concentration of SARS-CoV-2 viral copies this assay can detect is 131 copies/mL. A negative result does not preclude SARS-Cov-2 infection and should not be used as the sole basis for treatment or other patient management decisions. A negative result may occur with  improper specimen collection/handling, submission of specimen other than nasopharyngeal swab, presence of viral mutation(s) within the areas targeted by this  assay, and inadequate number of viral copies (<131 copies/mL). A negative result must be combined with clinical observations, patient history, and epidemiological information. The expected result is Negative.  Fact Sheet for Patients:  PinkCheek.be  Fact Sheet for Healthcare Providers:  GravelBags.it  This test is no t yet approved or cleared by the [place] FDA and  has been authorized for detection and/or diagnosis of SARS-CoV-2 by FDA under an Emergency Use Authorization (EUA). This EUA will remain  in effect (meaning this test can be used) for the duration of the COVID-19 declaration under Section 564(b)(1) of the Act, 21 U.S.C. section 360bbb-3(b)(1), unless the authorization is terminated or revoked sooner.     Influenza A by PCR NEGATIVE NEGATIVE Final   Influenza B by PCR NEGATIVE NEGATIVE Final    Comment: (NOTE) The Xpert Xpress SARS-CoV-2/FLU/RSV assay is intended as an aid in  the diagnosis of influenza from Nasopharyngeal swab specimens and  should not be used as a sole basis for treatment. Nasal washings and  aspirates are unacceptable for Xpert Xpress SARS-CoV-2/FLU/RSV  testing.  Fact Sheet for Patients: PinkCheek.be  Fact Sheet for Healthcare Providers: GravelBags.it  This test is  not yet approved or cleared by the [place] and  has been authorized for detection and/or diagnosis of SARS-CoV-2 by  FDA under an Emergency Use Authorization (EUA). This EUA will remain  in effect (meaning this test can be used) for the duration of the  Covid-19 declaration under Section 564(b)(1) of the Act, 21  U.S.C. section 360bbb-3(b)(1), unless the authorization is  terminated or revoked. Performed at [facility], [place] [address] [place], [place] [address]      Time coordinating discharge:  39  minutes  SIGNED:  Georgette Shell, MD  [facility] 12/08/2019, 11:45 AM

## 2019-12-08 NOTE — TOC Transition Note (Signed)
Transition of Care [facility]) - CM/SW Discharge Note   Patient Details  Name: Isaac Doyle MRN: [id number] Date of Birth: [birth year]  Transition of [facility]) CM/SW Contact:  Lennart Pall, LCSW Phone Number: 12/08/2019, 12:56 PM   Clinical Narrative:     Pt medically cleared for dc home today.  Pt and daughter aware and request PTAR transport as previously planned with Hospice.  [facility] to follow at home.  No further TOC needs.  Final next level of care: Home w Hospice Care Barriers to Discharge: Barriers Resolved   Patient Goals and CMS Choice Patient states their goals for this hospitalization and ongoing recovery are:: go home      Discharge Placement                       Discharge Plan and Services In-house Referral: Clinical Social Work   Post Acute Care Choice: Hospice          DME Arranged: N/A DME Agency: NA       HH Arranged: RN [place] Agency: Hospice and [place] Date [place]: 12/06/19   Representative spoke with at [place]: [place] Determinants of Health ([place]) Interventions     Readmission Risk Interventions Readmission Risk Prevention Plan 12/06/2019  Transportation Screening Complete  Medication Review Press photographer) Complete  PCP or Specialist appointment within 3-5 days of discharge Complete  HRI or [place] Complete  SW Recovery Care/Counseling Consult Complete  Palliative Care Screening Complete  [place] Not Applicable

## 2019-12-08 NOTE — Progress Notes (Signed)
Physical Therapy Treatment Patient Details Name: Isaac Doyle MRN: [id number] DOB: May 07, 1955 Today's Date: 12/08/2019    History of Present Illness 64 year old male with medical history of metastatic pancreatic cancer (patient of Dr. 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 He presented to the ED with RUQ abdominal pain x2 days. Pain radiated to back and chest. No N/V/D or fever or chills.    PT Comments    Pt feeling better.  Assisted OOB to amb a functional distance then back to bed to rest before D/C today  Follow Up Recommendations  Home health PT     Equipment Recommendations  None recommended by PT    Recommendations for Other Services       Precautions / Restrictions Precautions Precautions: Fall    Mobility  Bed Mobility Overal bed mobility: Modified Independent Bed Mobility: Supine to Sit;Sit to Supine           General bed mobility comments: increased time  Transfers Overall transfer level: Needs assistance Equipment used: None Transfers: Sit to/from [employer] Transfers Sit to Stand: Supervision Stand pivot transfers: Supervision;Min guard       General transfer comment: good safety cognition and use of hands to steady self  Ambulation/Gait Ambulation/Gait assistance: Supervision;Min guard Gait Distance (Feet): 85 Feet Assistive device: Rolling walker (2 wheeled) Gait Pattern/deviations: Step-through pattern;Decreased stride length;Trunk flexed Gait velocity: decreased   General Gait Details: tolerated distance well with light need for walker.  Most likely will use his walking stick at home.   Stairs             Wheelchair Mobility    Modified Rankin (Stroke Patients Only)       Balance                                            Cognition Arousal/Alertness: Awake/alert Behavior During Therapy: WFL for tasks assessed/performed Overall  Cognitive Status: Within Functional Limits for tasks assessed                                 General Comments: AxO x 3 pleasant      Exercises      General Comments        Pertinent Vitals/Pain Pain Assessment: No/denies pain Pain Location: stomach Pain Descriptors / Indicators: Guarding Pain Intervention(s): Monitored during session;Repositioned    Home Living                      Prior Function            PT Goals (current goals can now be found in the care plan section) Progress towards PT goals: Progressing toward goals    Frequency    Min 3X/week      PT Plan Current plan remains appropriate    Co-evaluation              AM-PAC PT "6 Clicks" Mobility   Outcome Measure  Help needed turning from your back to your side while in a flat bed without using bedrails?: None Help needed moving from lying on your back to sitting on the side of a flat bed without using bedrails?: None Help needed moving to and from a bed to a chair (including a wheelchair)?: None  Help needed standing up from a chair using your arms (e.g., wheelchair or bedside chair)?: None Help needed to walk in hospital room?: None Help needed climbing 3-5 steps with a railing? : A Little 6 Click Score: 23    End of Session Equipment Utilized During Treatment: Gait belt Activity Tolerance: Patient tolerated treatment well Patient left: in bed;with call bell/phone within reach Nurse Communication: Mobility status PT Visit Diagnosis: Muscle weakness (generalized) (M62.81);Unsteadiness on feet (R26.81);Difficulty in walking, not elsewhere classified (R26.2)     Time: 9800-1239 PT Time Calculation (min) (ACUTE ONLY): 18 min  Charges:  $Gait Training: 8-22 mins                     {Etai Copado  PTA Acute  Rehabilitation [employer]      [phone] Office      [phone]

## 2019-12-08 NOTE — Discharge Instructions (Signed)
Information on my medicine - ELIQUIS (apixaban)  This medication education was reviewed with me or my healthcare representative as part of my discharge preparation.  Eudelia Bunch, RPH  Why was Eliquis prescribed for you? Eliquis was prescribed to treat blood clots that may have been found in the veins of your legs (deep vein thrombosis) or in your lungs (pulmonary embolism) and to reduce the risk of them occurring again.  What do You need to know about Eliquis ? The dose is ONE 5 mg tablet taken TWICE daily.  Eliquis may be taken with or without food.   Try to take the dose about the same time in the morning and in the evening. If you have difficulty swallowing the tablet whole please discuss with your pharmacist how to take the medication safely.  Take Eliquis exactly as prescribed and DO NOT stop taking Eliquis without talking to the doctor who prescribed the medication.  Stopping may increase your risk of developing a new blood clot.  Refill your prescription before you run out.  After discharge, you should have regular check-up appointments with your healthcare provider that is prescribing your Eliquis.    What do you do if you miss a dose? If a dose of ELIQUIS is not taken at the scheduled time, take it as soon as possible on the same day and twice-daily administration should be resumed. The dose should not be doubled to make up for a missed dose.  Important Safety Information A possible side effect of Eliquis is bleeding. You should call your healthcare provider right away if you experience any of the following: ? Bleeding from an injury or your nose that does not stop. ? Unusual colored urine (red or dark brown) or unusual colored stools (red or black). ? Unusual bruising for unknown reasons. ? A serious fall or if you hit your head (even if there is no bleeding).  Some medicines may interact with Eliquis and might increase your risk of bleeding or clotting while on  Eliquis. To help avoid this, consult your healthcare provider or pharmacist prior to using any new prescription or non-prescription medications, including herbals, vitamins, non-steroidal anti-inflammatory drugs (NSAIDs) and supplements.  This website has more information on Eliquis (apixaban): http://www.eliquis.com/eliquis/home

## 2019-12-08 NOTE — Progress Notes (Deleted)
Manufacturing engineer [facility]) Hospital Liaison note.     Received request from [place] for family interest in [facility]. Chart reviewed and eligibility confirmed. Spoke with family to confirm interest and explain services. Family agreeable to transfer today. TOC aware.     [facility] will notify TOC when registration paperwork has been completed to arrange transport.    RN please call report to [phone].  Thank you,     Farrel Gordon, RN, CCM       Isaac Doyle (listed on [place] under Hospice/[facility])     [phone]

## 2019-12-09 ENCOUNTER — Inpatient Hospital Stay: Payer: No Typology Code available for payment source

## 2019-12-09 ENCOUNTER — Inpatient Hospital Stay: Payer: No Typology Code available for payment source | Admitting: Hematology

## 2019-12-27 ENCOUNTER — Other Ambulatory Visit: Payer: Self-pay | Admitting: Hematology

## 2019-12-27 ENCOUNTER — Telehealth: Payer: Self-pay

## 2019-12-27 MED ORDER — FENTANYL 12 MCG/HR TD PT72: 1.0000 | MEDICATED_PATCH | TRANSDERMAL | 0 refills | Status: AC

## 2019-12-27 NOTE — Telephone Encounter (Signed)
Hospice nurse Erline Levine called to request refill for fentanyl patches. She states they are working well for him.

## 2020-01-10 ENCOUNTER — Ambulatory Visit: Payer: Medicare Other | Admitting: Gastroenterology

## 2020-11-09 ENCOUNTER — Encounter: Payer: Self-pay | Admitting: Hematology

## 2021-08-18 IMAGING — RF DG ERCP WO/W SPHINCTEROTOMY
1 series · 15 of 15 positions shown · non-contrast
Comparison: MR 05/10/2019

CLINICAL DATA: 63-year-old male with biliary obstruction

EXAM:
ERCP
TECHNIQUE: Multiple spot images obtained with the fluoroscopic device and
submitted for interpretation post-procedure.
FLUOROSCOPY TIME:  Fluoroscopy Time:  6 minutes 57 seconds

[Series 1: unknown protocol · 0.20mm/px · 15 of 15 slices shown]
[im 1/15]
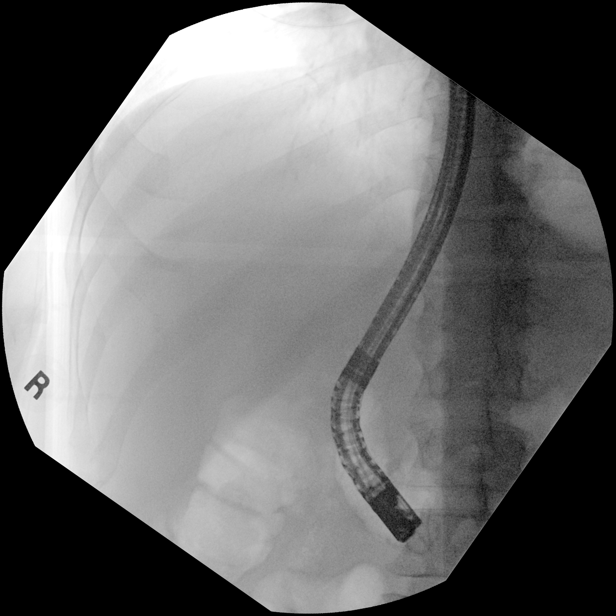
[im 2/15]
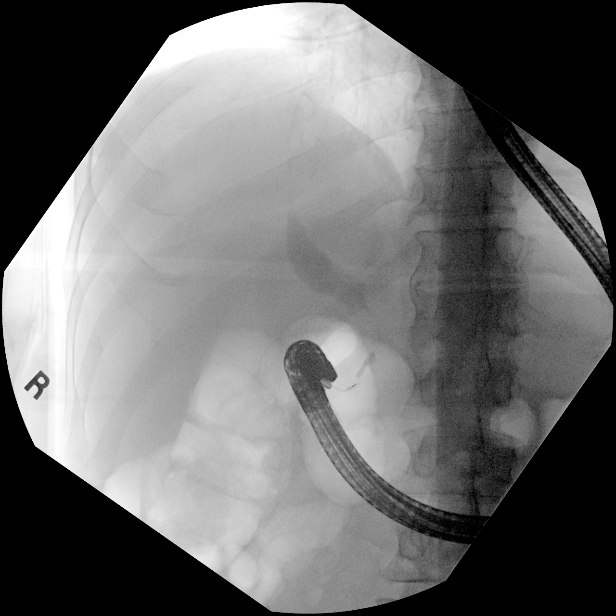
[im 3/15]
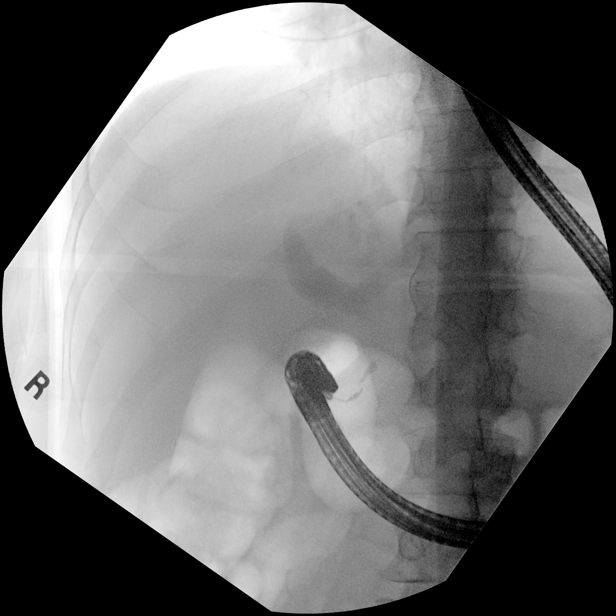
[im 4/15]
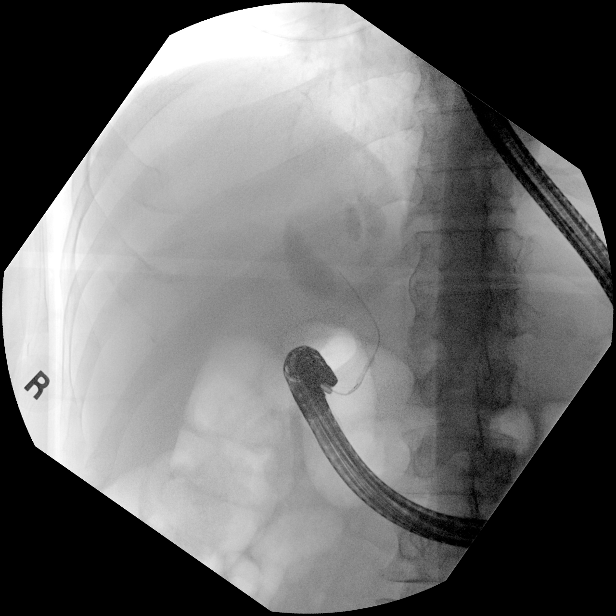
[im 5/15]
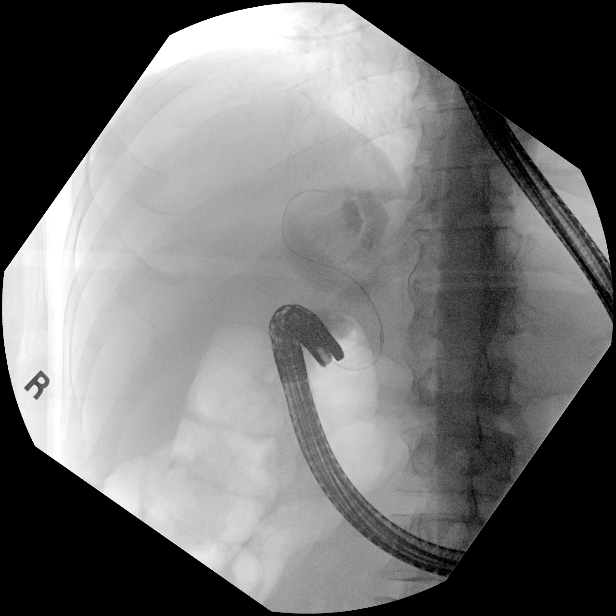
[im 6/15]
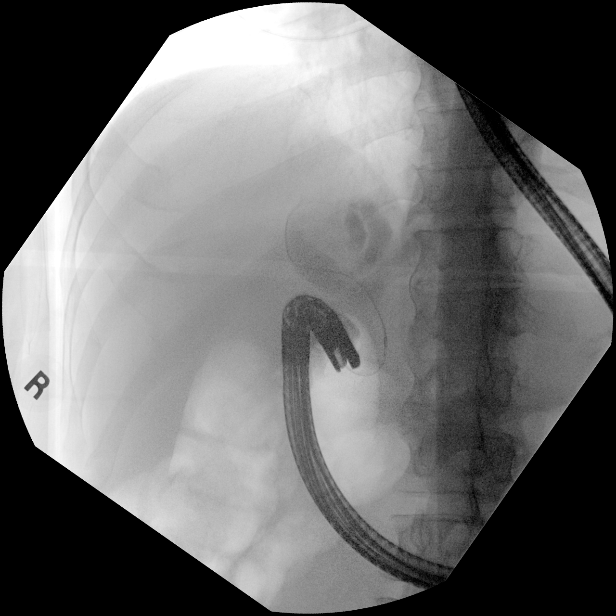
[im 7/15]
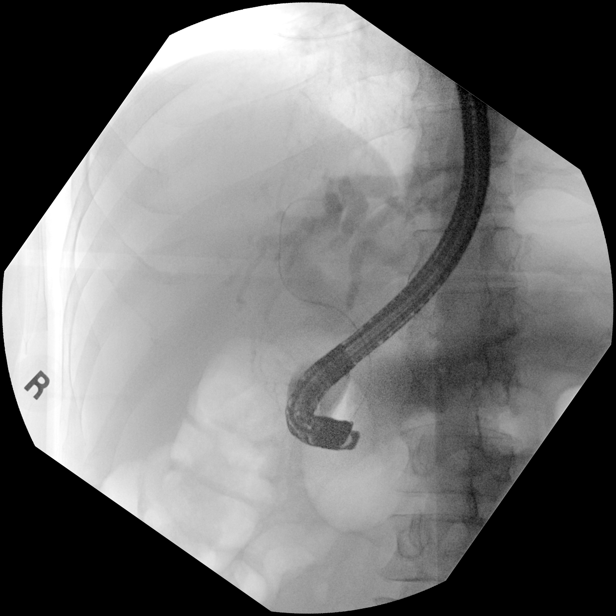
[im 8/15]
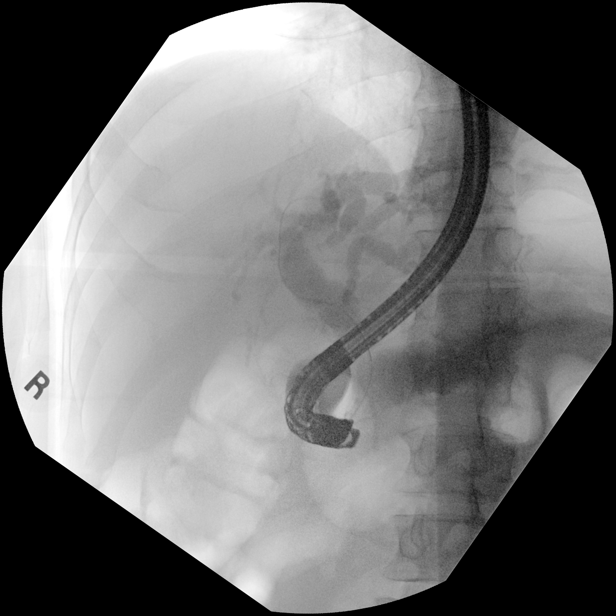
[im 9/15]
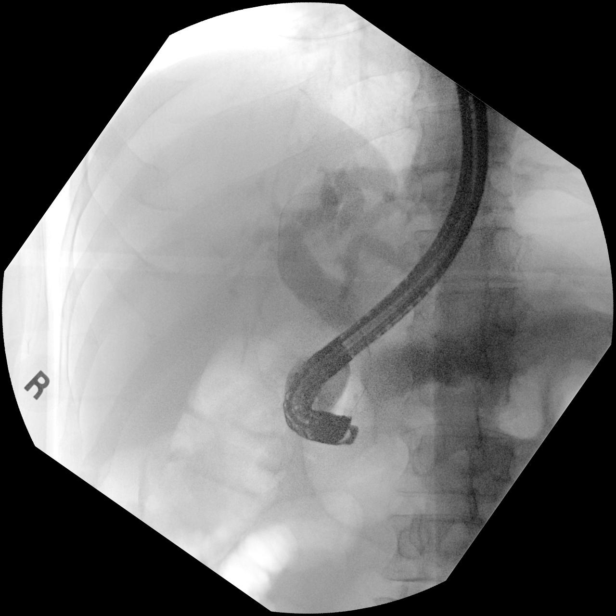
[im 10/15]
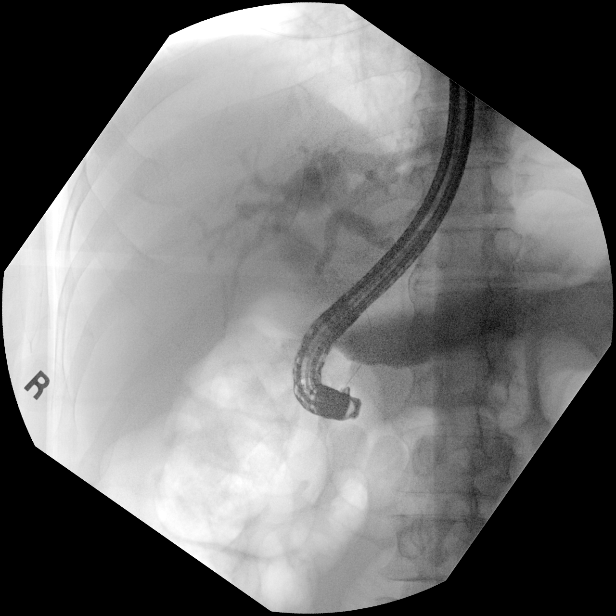
[im 11/15]
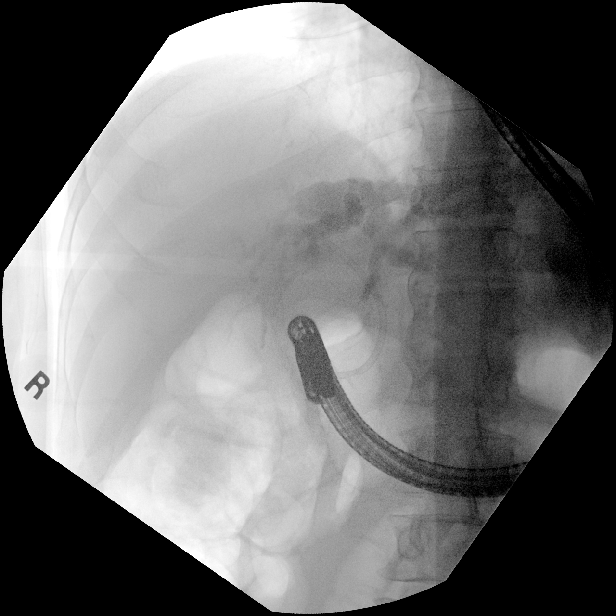
[im 12/15]
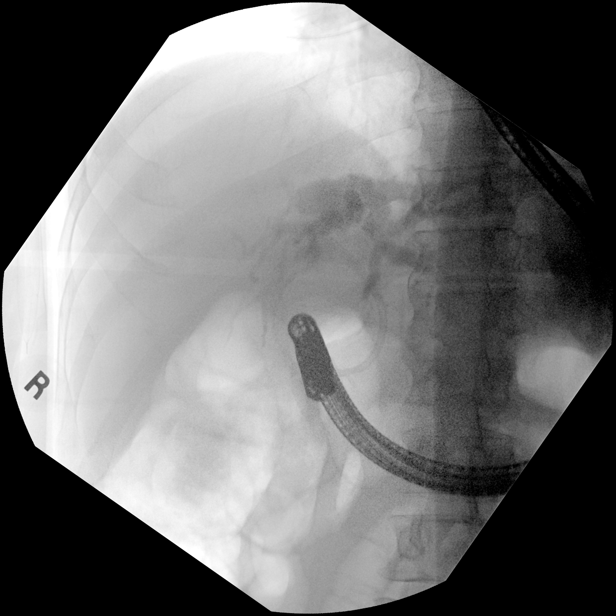
[im 13/15]
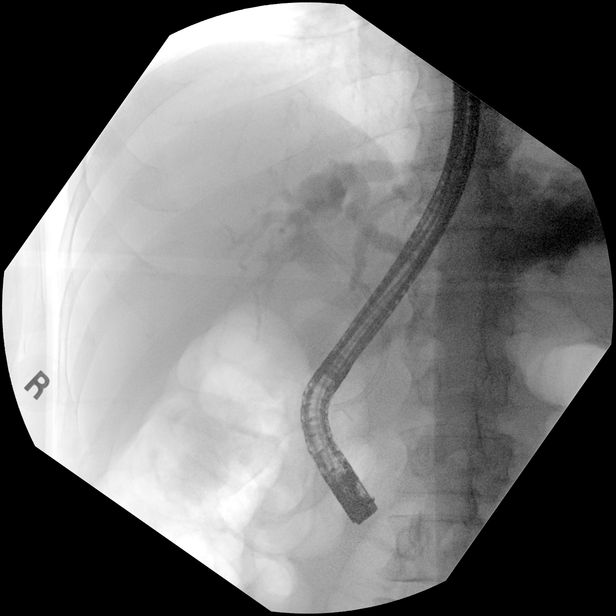
[im 14/15]
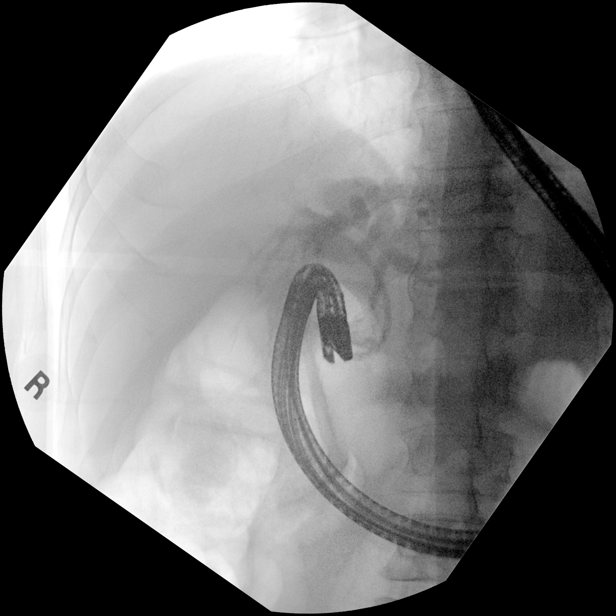
[im 15/15]
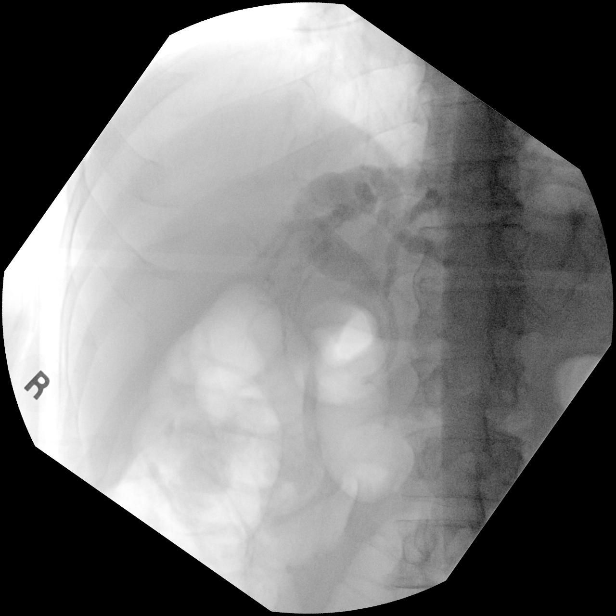

[15 of 15 positions shown; findings below may reference images not displayed]

FINDINGS: Limited intraoperative fluoroscopic spot images performed.

Initial image demonstrates endoscope projecting over the upper
abdomen. There is subsequently cannulation of the ampulla with
retrograde infusion of contrast.

Ossification of the extrahepatic biliary ducts with dilation.

Final image demonstrates placement of a plastic biliary stent.
IMPRESSION: Limited images during ERCP demonstrates treatment of extrahepatic
biliary system with deployment of plastic biliary stent.

Please refer to the dictated operative report for full details of
intraoperative findings and procedure.

## 2021-09-10 IMAGING — RF DG ERCP WO/W SPHINCTEROTOMY
1 series · 5 of 5 positions shown · non-contrast
Comparison: None.

CLINICAL DATA: 63-year-old male with a history of biliary stricture

EXAM:
ERCP
TECHNIQUE: Multiple spot images obtained with the fluoroscopic device and
submitted for interpretation post-procedure.
FLUOROSCOPY TIME:  Fluoroscopy Time:  2 minutes 31 seconds

[Series 1: unknown protocol · 0.20mm/px · 5 of 5 slices shown]
[im 1/5]
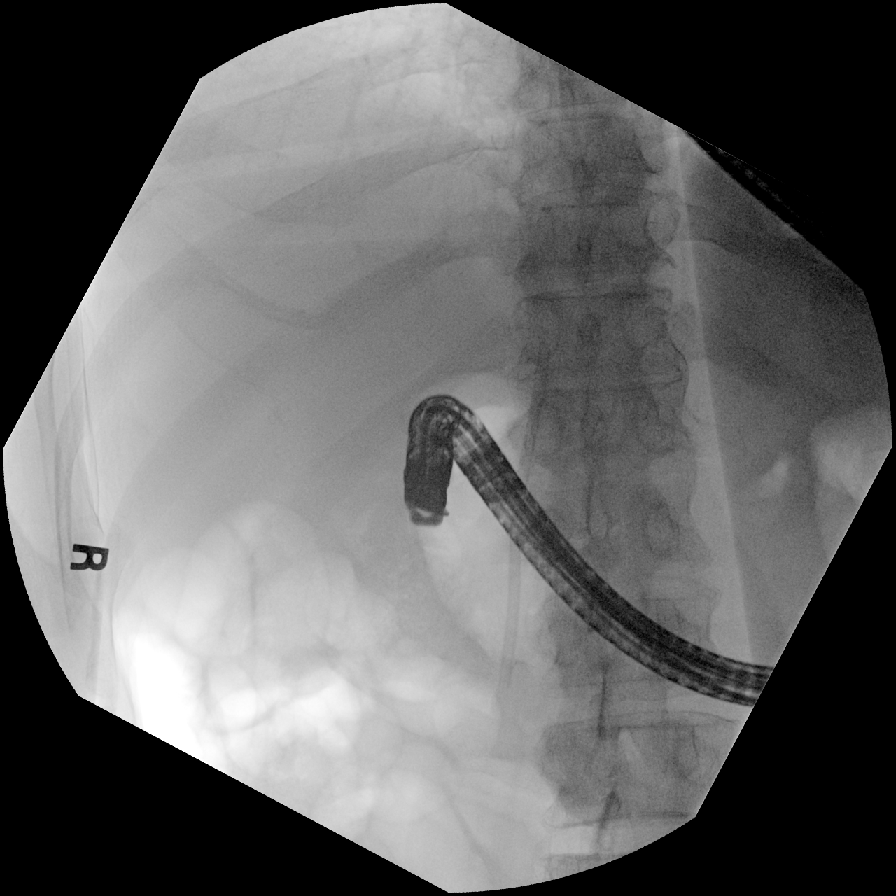
[im 2/5]
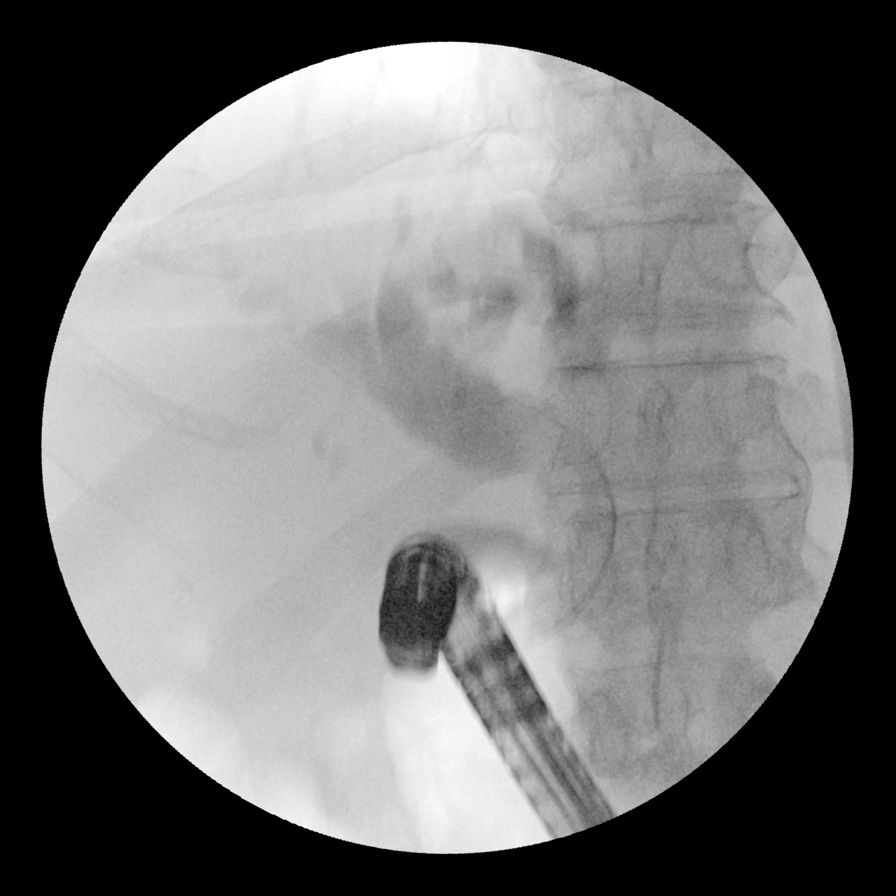
[im 3/5]
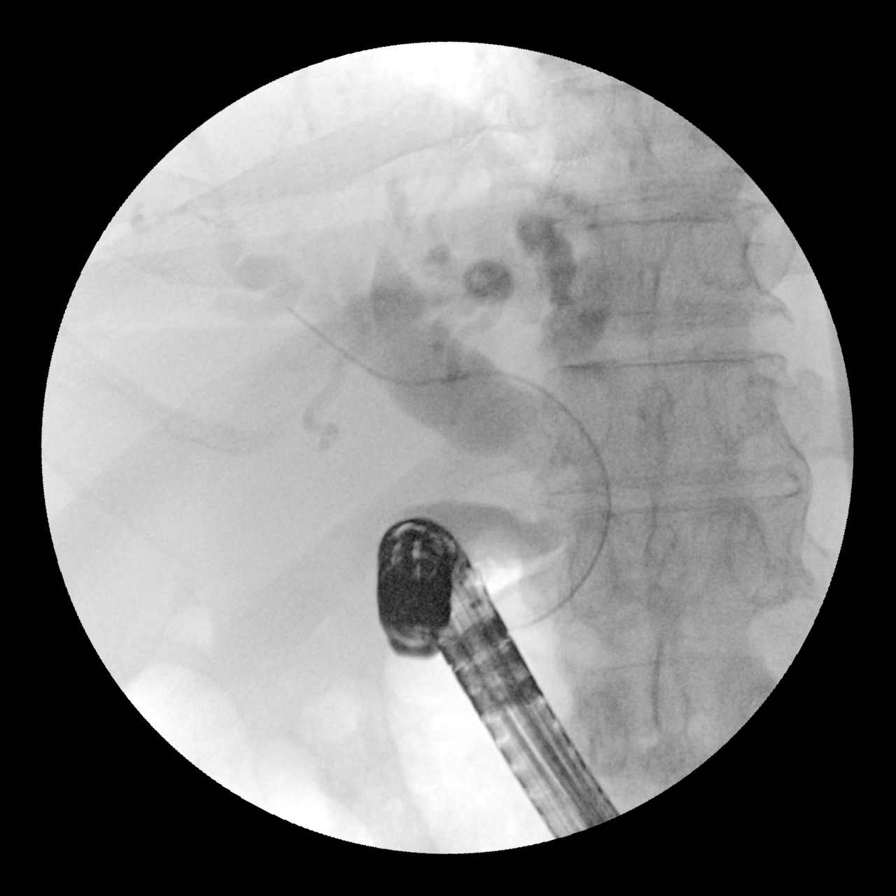
[im 4/5]
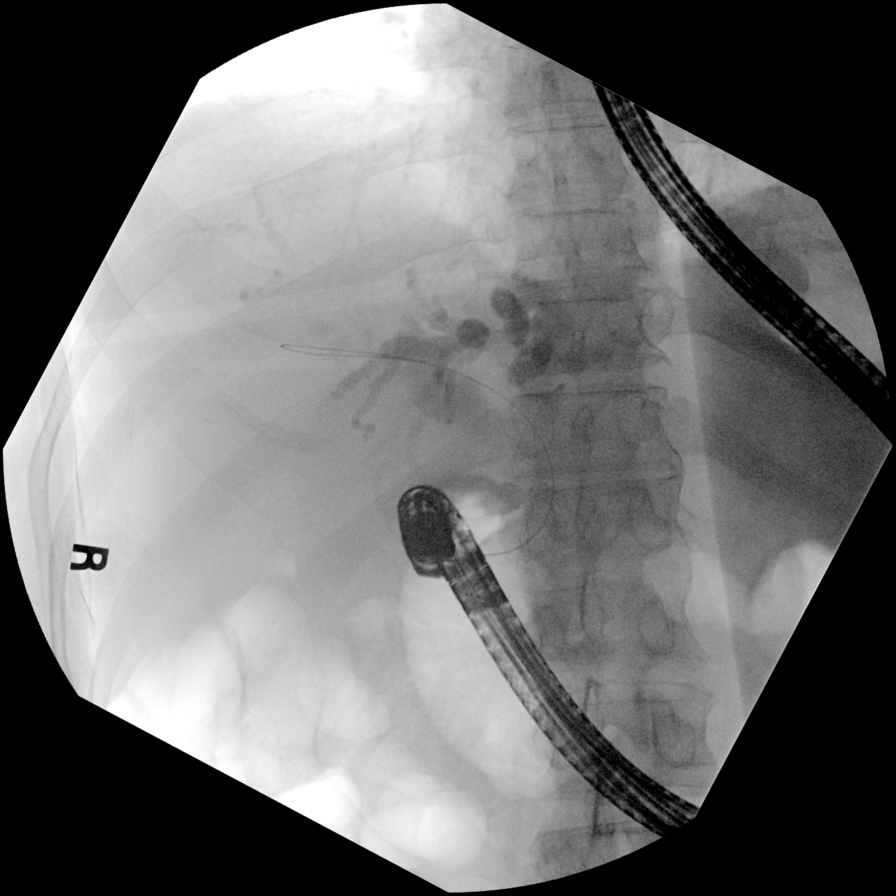
[im 5/5]
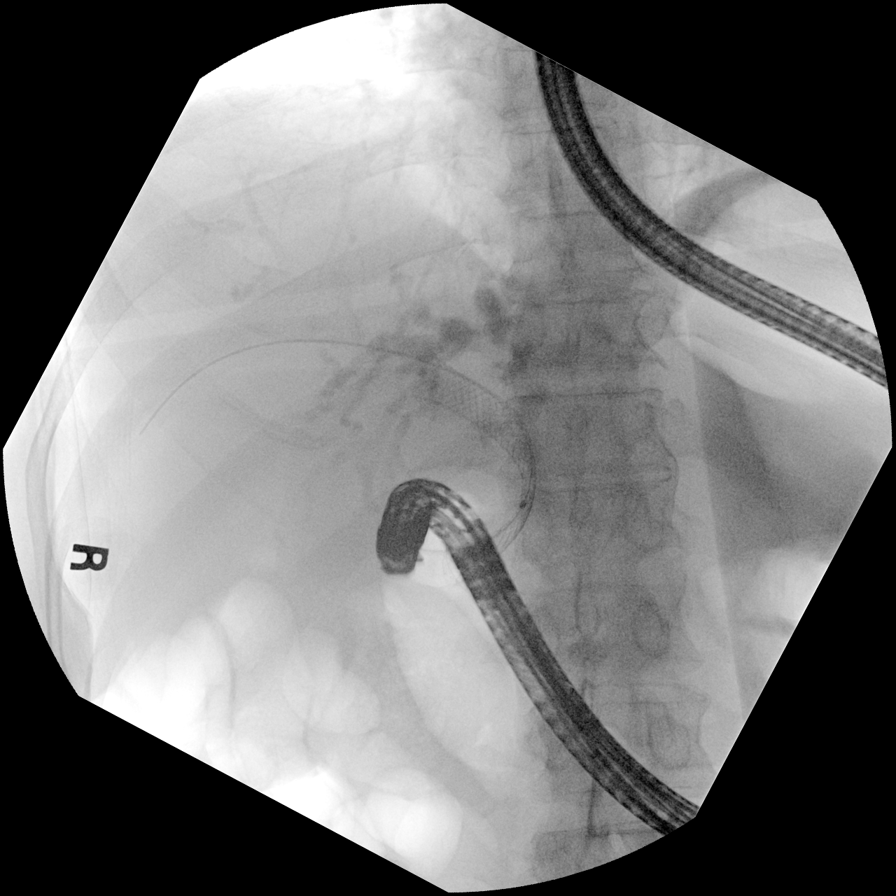

[5 of 5 positions shown; findings below may reference images not displayed]

FINDINGS: Limited intraoperative fluoroscopic spot images of ERCP.

Initial image demonstrates the endoscope projecting over the upper
abdomen with a plastic biliary stent in position.

Subsequently there has been removal of the stent placement of a
safety wire and partial opacification of the extrahepatic biliary
ducts.

Final image demonstrates placement of a metallic biliary stent peer
IMPRESSION: Limited images of ERCP demonstrates removal of a plastic biliary
stent and placement of a metallic biliary stent of the common bile
duct. Please refer to the dictated operative report for full details
of intraoperative findings and procedure.
